# Patient Record
Sex: Female | Born: 1955 | Race: White | Hispanic: No | Marital: Single | Smoking: Current every day smoker
Health system: Southern US, Community
[De-identification: ages and names within clinical notes are randomized; demographics above are authoritative.]

## PROBLEM LIST (undated history)

## (undated) DIAGNOSIS — R569 Unspecified convulsions: Secondary | ICD-10-CM

## (undated) DIAGNOSIS — E039 Hypothyroidism, unspecified: Secondary | ICD-10-CM

## (undated) DIAGNOSIS — Z992 Dependence on renal dialysis: Secondary | ICD-10-CM

## (undated) DIAGNOSIS — I639 Cerebral infarction, unspecified: Secondary | ICD-10-CM

## (undated) DIAGNOSIS — C801 Malignant (primary) neoplasm, unspecified: Secondary | ICD-10-CM

## (undated) DIAGNOSIS — I69359 Hemiplegia and hemiparesis following cerebral infarction affecting unspecified side: Secondary | ICD-10-CM

## (undated) DIAGNOSIS — E119 Type 2 diabetes mellitus without complications: Secondary | ICD-10-CM

## (undated) DIAGNOSIS — N186 End stage renal disease: Secondary | ICD-10-CM

## (undated) DIAGNOSIS — I509 Heart failure, unspecified: Secondary | ICD-10-CM

## (undated) DIAGNOSIS — I8290 Acute embolism and thrombosis of unspecified vein: Secondary | ICD-10-CM

## (undated) DIAGNOSIS — I10 Essential (primary) hypertension: Secondary | ICD-10-CM

## (undated) HISTORY — PX: AMPUTATION TOE: SHX6595

## (undated) HISTORY — PX: INSERTION OF DIALYSIS CATHETER: SHX1324

## (undated) HISTORY — PX: CHOLECYSTECTOMY: SHX55

---

## 2006-07-05 ENCOUNTER — Emergency Department: Payer: Self-pay | Admitting: Emergency Medicine

## 2006-08-29 ENCOUNTER — Ambulatory Visit: Payer: Self-pay | Admitting: Family Medicine

## 2006-09-13 ENCOUNTER — Ambulatory Visit: Payer: Self-pay | Admitting: Family Medicine

## 2006-10-13 ENCOUNTER — Ambulatory Visit: Payer: Self-pay | Admitting: Family Medicine

## 2007-05-11 ENCOUNTER — Inpatient Hospital Stay: Payer: Self-pay | Admitting: Internal Medicine

## 2007-09-18 ENCOUNTER — Inpatient Hospital Stay: Payer: Self-pay | Admitting: Internal Medicine

## 2007-09-18 ENCOUNTER — Encounter: Payer: Self-pay | Admitting: Internal Medicine

## 2007-10-13 ENCOUNTER — Encounter: Payer: Self-pay | Admitting: Internal Medicine

## 2007-11-15 ENCOUNTER — Emergency Department: Payer: Self-pay | Admitting: Emergency Medicine

## 2008-11-02 ENCOUNTER — Inpatient Hospital Stay: Payer: Self-pay | Admitting: Internal Medicine

## 2009-02-13 ENCOUNTER — Observation Stay: Payer: Self-pay | Admitting: Student

## 2009-03-31 ENCOUNTER — Ambulatory Visit: Payer: Self-pay | Admitting: Cardiovascular Disease

## 2009-06-12 ENCOUNTER — Emergency Department: Payer: Self-pay | Admitting: Emergency Medicine

## 2009-06-20 ENCOUNTER — Encounter: Payer: Self-pay | Admitting: Rheumatology

## 2009-07-15 ENCOUNTER — Encounter: Payer: Self-pay | Admitting: Rheumatology

## 2009-07-19 ENCOUNTER — Observation Stay: Payer: Self-pay | Admitting: Internal Medicine

## 2009-08-12 ENCOUNTER — Encounter: Payer: Self-pay | Admitting: Rheumatology

## 2009-08-13 ENCOUNTER — Emergency Department: Payer: Self-pay

## 2009-10-06 ENCOUNTER — Ambulatory Visit: Payer: Self-pay | Admitting: Surgery

## 2009-10-13 ENCOUNTER — Inpatient Hospital Stay: Payer: Self-pay | Admitting: Surgery

## 2009-10-17 ENCOUNTER — Inpatient Hospital Stay: Payer: Self-pay | Admitting: Surgery

## 2010-12-21 ENCOUNTER — Inpatient Hospital Stay: Payer: Self-pay | Admitting: Internal Medicine

## 2011-01-06 ENCOUNTER — Other Ambulatory Visit: Payer: Self-pay | Admitting: Nephrology

## 2011-01-07 ENCOUNTER — Observation Stay: Payer: Self-pay | Admitting: Nephrology

## 2011-01-22 ENCOUNTER — Ambulatory Visit: Payer: Self-pay | Admitting: Vascular Surgery

## 2011-01-25 ENCOUNTER — Ambulatory Visit: Payer: Self-pay | Admitting: Vascular Surgery

## 2011-01-27 ENCOUNTER — Inpatient Hospital Stay: Payer: Self-pay | Admitting: Nephrology

## 2011-01-28 DIAGNOSIS — R7989 Other specified abnormal findings of blood chemistry: Secondary | ICD-10-CM

## 2011-02-25 ENCOUNTER — Emergency Department: Payer: Self-pay | Admitting: Emergency Medicine

## 2011-03-17 ENCOUNTER — Ambulatory Visit: Payer: Self-pay | Admitting: Gastroenterology

## 2011-03-24 ENCOUNTER — Ambulatory Visit: Payer: Self-pay | Admitting: Internal Medicine

## 2011-11-12 ENCOUNTER — Inpatient Hospital Stay: Payer: Self-pay | Admitting: Internal Medicine

## 2011-11-12 LAB — COMPREHENSIVE METABOLIC PANEL
Alkaline Phosphatase: 87 U/L (ref 50–136)
Anion Gap: 12 (ref 7–16)
BUN: 21 mg/dL — ABNORMAL HIGH (ref 7–18)
Calcium, Total: 8.9 mg/dL (ref 8.5–10.1)
Co2: 23 mmol/L (ref 21–32)
EGFR (African American): 11 — ABNORMAL LOW
EGFR (Non-African Amer.): 9 — ABNORMAL LOW
Osmolality: 282 (ref 275–301)
Potassium: 4.1 mmol/L (ref 3.5–5.1)
SGPT (ALT): 17 U/L
Sodium: 138 mmol/L (ref 136–145)
Total Protein: 7 g/dL (ref 6.4–8.2)

## 2011-11-12 LAB — URINALYSIS, COMPLETE
Bilirubin,UR: NEGATIVE
Ketone: NEGATIVE
Ph: 7 (ref 4.5–8.0)
Protein: 100
Specific Gravity: 1.014 (ref 1.003–1.030)
Squamous Epithelial: 3
WBC UR: 3653 /HPF (ref 0–5)

## 2011-11-12 LAB — CBC
HCT: 37.5 % (ref 35.0–47.0)
HGB: 12.7 g/dL (ref 12.0–16.0)
MCHC: 33.7 g/dL (ref 32.0–36.0)
MCV: 92 fL (ref 80–100)

## 2011-11-13 LAB — BASIC METABOLIC PANEL
Anion Gap: 13 (ref 7–16)
Calcium, Total: 7.8 mg/dL — ABNORMAL LOW (ref 8.5–10.1)
Co2: 21 mmol/L (ref 21–32)
Creatinine: 4.94 mg/dL — ABNORMAL HIGH (ref 0.60–1.30)
EGFR (Non-African Amer.): 9 — ABNORMAL LOW
Glucose: 108 mg/dL — ABNORMAL HIGH (ref 65–99)
Osmolality: 287 (ref 275–301)
Sodium: 142 mmol/L (ref 136–145)

## 2011-11-13 LAB — CBC WITH DIFFERENTIAL/PLATELET
Basophil %: 1 %
Eosinophil #: 0 10*3/uL (ref 0.0–0.7)
Eosinophil %: 0.2 %
HCT: 33.8 % — ABNORMAL LOW (ref 35.0–47.0)
HGB: 11.4 g/dL — ABNORMAL LOW (ref 12.0–16.0)
Lymphocyte #: 2.4 10*3/uL (ref 1.0–3.6)
Lymphocyte %: 24.9 %
MCH: 31.3 pg (ref 26.0–34.0)
MCHC: 33.9 g/dL (ref 32.0–36.0)
MCV: 92 fL (ref 80–100)
Monocyte #: 0.7 x10 3/mm (ref 0.2–0.9)
Monocyte %: 7 %
Neutrophil #: 6.4 10*3/uL (ref 1.4–6.5)
Neutrophil %: 66.9 %
RDW: 13.3 % (ref 11.5–14.5)
WBC: 9.5 10*3/uL (ref 3.6–11.0)

## 2011-11-13 LAB — BODY FLUID CELL COUNT WITH DIFFERENTIAL
Neutrophils: 3 %
Nucleated Cell Count: 185 /mm3
Other Cells BF: 0 %
Other Mononuclear Cells: 67 %

## 2011-11-14 LAB — URINE CULTURE

## 2011-11-17 LAB — CBC WITH DIFFERENTIAL/PLATELET
Basophil #: 0.1 10*3/uL (ref 0.0–0.1)
Basophil %: 0.5 %
Eosinophil #: 0.2 10*3/uL (ref 0.0–0.7)
HGB: 11.2 g/dL — ABNORMAL LOW (ref 12.0–16.0)
Lymphocyte #: 4 10*3/uL — ABNORMAL HIGH (ref 1.0–3.6)
Lymphocyte %: 38.4 %
MCH: 31.4 pg (ref 26.0–34.0)
Neutrophil %: 50.4 %
RDW: 13 % (ref 11.5–14.5)
WBC: 10.3 10*3/uL (ref 3.6–11.0)

## 2011-11-17 LAB — BASIC METABOLIC PANEL
Chloride: 100 mmol/L (ref 98–107)
Co2: 26 mmol/L (ref 21–32)
Creatinine: 3.53 mg/dL — ABNORMAL HIGH (ref 0.60–1.30)
EGFR (African American): 16 — ABNORMAL LOW
Glucose: 87 mg/dL (ref 65–99)
Osmolality: 273 (ref 275–301)
Potassium: 3.5 mmol/L (ref 3.5–5.1)

## 2011-11-17 LAB — BODY FLUID CULTURE

## 2011-11-18 LAB — BASIC METABOLIC PANEL
Anion Gap: 11 (ref 7–16)
BUN: 6 mg/dL — ABNORMAL LOW (ref 7–18)
Calcium, Total: 7.3 mg/dL — ABNORMAL LOW (ref 8.5–10.1)
Chloride: 106 mmol/L (ref 98–107)
Co2: 26 mmol/L (ref 21–32)
Creatinine: 2.69 mg/dL — ABNORMAL HIGH (ref 0.60–1.30)
Glucose: 91 mg/dL (ref 65–99)
Sodium: 143 mmol/L (ref 136–145)

## 2011-11-18 LAB — CBC WITH DIFFERENTIAL/PLATELET
Basophil #: 0 10*3/uL (ref 0.0–0.1)
Basophil %: 0.4 %
Eosinophil #: 0.2 10*3/uL (ref 0.0–0.7)
Eosinophil %: 3.1 %
HGB: 10.2 g/dL — ABNORMAL LOW (ref 12.0–16.0)
MCH: 31.3 pg (ref 26.0–34.0)
MCHC: 33.8 g/dL (ref 32.0–36.0)
MCV: 93 fL (ref 80–100)
Monocyte #: 0.8 x10 3/mm (ref 0.2–0.9)
Monocyte %: 12.1 %
Neutrophil #: 3 10*3/uL (ref 1.4–6.5)
Neutrophil %: 44.1 %
Platelet: 146 10*3/uL — ABNORMAL LOW (ref 150–440)
WBC: 6.8 10*3/uL (ref 3.6–11.0)

## 2011-11-22 LAB — PATHOLOGY REPORT

## 2011-12-14 ENCOUNTER — Ambulatory Visit: Payer: Self-pay | Admitting: Vascular Surgery

## 2011-12-28 ENCOUNTER — Ambulatory Visit: Payer: Self-pay | Admitting: Vascular Surgery

## 2011-12-28 LAB — CBC
HCT: 34.1 % — ABNORMAL LOW (ref 35.0–47.0)
HGB: 11 g/dL — ABNORMAL LOW (ref 12.0–16.0)
MCH: 30.4 pg (ref 26.0–34.0)
MCHC: 32.3 g/dL (ref 32.0–36.0)
MCV: 94 fL (ref 80–100)
Platelet: 200 10*3/uL (ref 150–440)
RDW: 14.3 % (ref 11.5–14.5)
WBC: 8.9 10*3/uL (ref 3.6–11.0)

## 2011-12-28 LAB — BASIC METABOLIC PANEL
Anion Gap: 12 (ref 7–16)
Calcium, Total: 7.9 mg/dL — ABNORMAL LOW (ref 8.5–10.1)
Chloride: 100 mmol/L (ref 98–107)
Co2: 21 mmol/L (ref 21–32)
Creatinine: 4.81 mg/dL — ABNORMAL HIGH (ref 0.60–1.30)
EGFR (African American): 11 — ABNORMAL LOW
Osmolality: 274 (ref 275–301)
Potassium: 3.5 mmol/L (ref 3.5–5.1)

## 2011-12-30 ENCOUNTER — Ambulatory Visit: Payer: Self-pay | Admitting: Vascular Surgery

## 2011-12-31 ENCOUNTER — Ambulatory Visit: Payer: Self-pay | Admitting: Vascular Surgery

## 2011-12-31 LAB — PROTIME-INR
INR: 0.9
Prothrombin Time: 12.5 secs (ref 11.5–14.7)

## 2012-02-24 ENCOUNTER — Ambulatory Visit: Payer: Self-pay | Admitting: Physician Assistant

## 2012-06-02 ENCOUNTER — Ambulatory Visit: Payer: Self-pay | Admitting: Vascular Surgery

## 2012-06-02 LAB — PROTIME-INR: INR: 0.9

## 2012-06-02 LAB — POTASSIUM: Potassium: 3.6 mmol/L (ref 3.5–5.1)

## 2012-06-03 ENCOUNTER — Emergency Department: Payer: Self-pay | Admitting: Emergency Medicine

## 2012-06-05 ENCOUNTER — Emergency Department: Payer: Self-pay | Admitting: Emergency Medicine

## 2012-06-30 ENCOUNTER — Ambulatory Visit: Payer: Self-pay | Admitting: Vascular Surgery

## 2012-06-30 LAB — BASIC METABOLIC PANEL
Anion Gap: 10 (ref 7–16)
Creatinine: 5.18 mg/dL — ABNORMAL HIGH (ref 0.60–1.30)
EGFR (African American): 10 — ABNORMAL LOW
Glucose: 106 mg/dL — ABNORMAL HIGH (ref 65–99)
Sodium: 137 mmol/L (ref 136–145)

## 2012-06-30 LAB — CBC
HCT: 32.4 % — ABNORMAL LOW (ref 35.0–47.0)
MCH: 32.3 pg (ref 26.0–34.0)
MCV: 96 fL (ref 80–100)
Platelet: 237 10*3/uL (ref 150–440)
RBC: 3.38 10*6/uL — ABNORMAL LOW (ref 3.80–5.20)
RDW: 14.2 % (ref 11.5–14.5)
WBC: 12.1 10*3/uL — ABNORMAL HIGH (ref 3.6–11.0)

## 2012-08-09 ENCOUNTER — Ambulatory Visit: Payer: Self-pay | Admitting: Physician Assistant

## 2013-07-14 LAB — CBC
HCT: 35.3 % (ref 35.0–47.0)
HGB: 12 g/dL (ref 12.0–16.0)
MCH: 31.6 pg (ref 26.0–34.0)
MCHC: 33.9 g/dL (ref 32.0–36.0)
MCV: 93 fL (ref 80–100)
Platelet: 365 10*3/uL (ref 150–440)
RBC: 3.78 10*6/uL — ABNORMAL LOW (ref 3.80–5.20)
RDW: 12.9 % (ref 11.5–14.5)
WBC: 11.7 10*3/uL — ABNORMAL HIGH (ref 3.6–11.0)

## 2013-07-14 LAB — BASIC METABOLIC PANEL
Anion Gap: 8 (ref 7–16)
BUN: 26 mg/dL — ABNORMAL HIGH (ref 7–18)
Calcium, Total: 7.8 mg/dL — ABNORMAL LOW (ref 8.5–10.1)
Chloride: 92 mmol/L — ABNORMAL LOW (ref 98–107)
Co2: 29 mmol/L (ref 21–32)
Creatinine: 4.77 mg/dL — ABNORMAL HIGH (ref 0.60–1.30)
EGFR (African American): 11 — ABNORMAL LOW
EGFR (Non-African Amer.): 9 — ABNORMAL LOW
GLUCOSE: 185 mg/dL — AB (ref 65–99)
Osmolality: 269 (ref 275–301)
Potassium: 2.5 mmol/L — CL (ref 3.5–5.1)
SODIUM: 129 mmol/L — AB (ref 136–145)

## 2013-07-15 LAB — CBC WITH DIFFERENTIAL/PLATELET
BASOS ABS: 0 10*3/uL (ref 0.0–0.1)
Basophil %: 0.5 %
EOS ABS: 0.1 10*3/uL (ref 0.0–0.7)
EOS PCT: 1.1 %
HCT: 30.8 % — ABNORMAL LOW (ref 35.0–47.0)
HGB: 10.7 g/dL — AB (ref 12.0–16.0)
LYMPHS PCT: 19 %
Lymphocyte #: 1.9 10*3/uL (ref 1.0–3.6)
MCH: 32 pg (ref 26.0–34.0)
MCHC: 34.7 g/dL (ref 32.0–36.0)
MCV: 92 fL (ref 80–100)
MONOS PCT: 7.2 %
Monocyte #: 0.7 x10 3/mm (ref 0.2–0.9)
Neutrophil #: 7.2 10*3/uL — ABNORMAL HIGH (ref 1.4–6.5)
Neutrophil %: 72.2 %
PLATELETS: 330 10*3/uL (ref 150–440)
RBC: 3.34 10*6/uL — AB (ref 3.80–5.20)
RDW: 12.9 % (ref 11.5–14.5)
WBC: 10 10*3/uL (ref 3.6–11.0)

## 2013-07-15 LAB — BASIC METABOLIC PANEL
Anion Gap: 7 (ref 7–16)
BUN: 28 mg/dL — ABNORMAL HIGH (ref 7–18)
CHLORIDE: 95 mmol/L — AB (ref 98–107)
CO2: 29 mmol/L (ref 21–32)
Calcium, Total: 7.8 mg/dL — ABNORMAL LOW (ref 8.5–10.1)
Creatinine: 4.99 mg/dL — ABNORMAL HIGH (ref 0.60–1.30)
EGFR (African American): 10 — ABNORMAL LOW
EGFR (Non-African Amer.): 9 — ABNORMAL LOW
Glucose: 109 mg/dL — ABNORMAL HIGH (ref 65–99)
Osmolality: 269 (ref 275–301)
Potassium: 3.1 mmol/L — ABNORMAL LOW (ref 3.5–5.1)
SODIUM: 131 mmol/L — AB (ref 136–145)

## 2013-07-15 LAB — URINALYSIS, COMPLETE
BACTERIA: NONE SEEN
BILIRUBIN, UR: NEGATIVE
BLOOD: NEGATIVE
Glucose,UR: 50 mg/dL (ref 0–75)
KETONE: NEGATIVE
Leukocyte Esterase: NEGATIVE
Nitrite: NEGATIVE
Ph: 7 (ref 4.5–8.0)
Protein: 100
RBC,UR: 1 /HPF (ref 0–5)
SPECIFIC GRAVITY: 1.01 (ref 1.003–1.030)
Squamous Epithelial: 1

## 2013-07-15 LAB — BODY FLUID CELL COUNT WITH DIFFERENTIAL
Basophil: 0 %
Eosinophil: 0 %
Lymphocytes: 11 %
Neutrophils: 83 %
Nucleated Cell Count: 2078 /mm 3
Other Cells BF: 0 %
Other Mononuclear Cells: 6 %

## 2013-07-16 ENCOUNTER — Inpatient Hospital Stay: Payer: Self-pay | Admitting: Specialist

## 2013-07-16 LAB — BASIC METABOLIC PANEL
Anion Gap: 6 — ABNORMAL LOW (ref 7–16)
BUN: 25 mg/dL — ABNORMAL HIGH (ref 7–18)
CALCIUM: 8.2 mg/dL — AB (ref 8.5–10.1)
CHLORIDE: 95 mmol/L — AB (ref 98–107)
CREATININE: 4.11 mg/dL — AB (ref 0.60–1.30)
Co2: 30 mmol/L (ref 21–32)
EGFR (African American): 13 — ABNORMAL LOW
EGFR (Non-African Amer.): 11 — ABNORMAL LOW
Glucose: 183 mg/dL — ABNORMAL HIGH (ref 65–99)
OSMOLALITY: 272 (ref 275–301)
Potassium: 3 mmol/L — ABNORMAL LOW (ref 3.5–5.1)
Sodium: 131 mmol/L — ABNORMAL LOW (ref 136–145)

## 2013-07-16 LAB — BODY FLUID CELL COUNT WITH DIFFERENTIAL
BASOS ABS: 0 %
Eosinophil: 0 %
Lymphocytes: 15 %
Neutrophils: 62 %
Nucleated Cell Count: 165 /mm3
Other Cells BF: 0 %
Other Mononuclear Cells: 23 %

## 2013-07-16 LAB — URINE CULTURE

## 2013-07-16 LAB — PHOSPHORUS: PHOSPHORUS: 2.5 mg/dL (ref 2.5–4.9)

## 2013-07-17 LAB — VANCOMYCIN, TROUGH: VANCOMYCIN, TROUGH: 14 ug/mL (ref 10–20)

## 2013-07-19 LAB — CULTURE, BLOOD (SINGLE)

## 2013-07-24 LAB — MISC AER/ANAEROBIC CULT.

## 2013-08-25 ENCOUNTER — Emergency Department: Payer: Self-pay | Admitting: Emergency Medicine

## 2013-09-11 ENCOUNTER — Ambulatory Visit: Payer: Self-pay | Admitting: Family

## 2014-05-30 ENCOUNTER — Inpatient Hospital Stay: Payer: Self-pay | Admitting: Internal Medicine

## 2014-05-30 LAB — URINALYSIS, COMPLETE
BACTERIA: NONE SEEN
BILIRUBIN, UR: NEGATIVE
Blood: NEGATIVE
Glucose,UR: >=500 mg/dL (ref 0–75)
KETONE: NEGATIVE
LEUKOCYTE ESTERASE: NEGATIVE
Nitrite: NEGATIVE
Ph: 7 (ref 4.5–8.0)
Protein: 100
RBC,UR: 3 /HPF (ref 0–5)
Specific Gravity: 1.009 (ref 1.003–1.030)
WBC UR: 12 /HPF (ref 0–5)

## 2014-05-30 LAB — COMPREHENSIVE METABOLIC PANEL
Albumin: 2.6 g/dL — ABNORMAL LOW (ref 3.4–5.0)
Alkaline Phosphatase: 233 U/L — ABNORMAL HIGH
Anion Gap: 17 — ABNORMAL HIGH (ref 7–16)
BILIRUBIN TOTAL: 0.5 mg/dL (ref 0.2–1.0)
BUN: 40 mg/dL — ABNORMAL HIGH (ref 7–18)
CO2: 24 mmol/L (ref 21–32)
CREATININE: 6.25 mg/dL — AB (ref 0.60–1.30)
Calcium, Total: 9.3 mg/dL (ref 8.5–10.1)
Chloride: 90 mmol/L — ABNORMAL LOW (ref 98–107)
EGFR (African American): 9 — ABNORMAL LOW
GFR CALC NON AF AMER: 7 — AB
GLUCOSE: 295 mg/dL — AB (ref 65–99)
OSMOLALITY: 283 (ref 275–301)
Potassium: 2.9 mmol/L — ABNORMAL LOW (ref 3.5–5.1)
SGOT(AST): 45 U/L — ABNORMAL HIGH (ref 15–37)
SGPT (ALT): 37 U/L
SODIUM: 131 mmol/L — AB (ref 136–145)
Total Protein: 8 g/dL (ref 6.4–8.2)

## 2014-05-30 LAB — DIFFERENTIAL
BASOS PCT: 1 %
Basophil #: 0.2 10*3/uL — ABNORMAL HIGH (ref 0.0–0.1)
EOS PCT: 0 %
Eosinophil #: 0 10*3/uL (ref 0.0–0.7)
Lymphocyte #: 1.9 10*3/uL (ref 1.0–3.6)
Lymphocyte %: 9.5 %
MONOS PCT: 3.4 %
Monocyte #: 0.7 x10 3/mm (ref 0.2–0.9)
NEUTROS ABS: 17.3 10*3/uL — AB (ref 1.4–6.5)
Neutrophil %: 86.1 %

## 2014-05-30 LAB — CBC
HCT: 41.2 % (ref 35.0–47.0)
HGB: 14 g/dL (ref 12.0–16.0)
MCH: 31.7 pg (ref 26.0–34.0)
MCHC: 33.9 g/dL (ref 32.0–36.0)
MCV: 94 fL (ref 80–100)
PLATELETS: 350 10*3/uL (ref 150–440)
RBC: 4.4 10*6/uL (ref 3.80–5.20)
RDW: 12.2 % (ref 11.5–14.5)
WBC: 20.1 10*3/uL — AB (ref 3.6–11.0)

## 2014-05-30 LAB — PROTIME-INR
INR: 1
PROTHROMBIN TIME: 13.3 s (ref 11.5–14.7)

## 2014-05-30 LAB — LIPASE, BLOOD: LIPASE: 246 U/L (ref 73–393)

## 2014-05-31 LAB — BODY FLUID CELL COUNT WITH DIFFERENTIAL
Basophil: 0 %
EOS PCT: 0 %
LYMPHS PCT: 21 %
NEUTROS PCT: 0 %
NUCLEATED CELL COUNT: 22 /mm3
Other Cells BF: 0 %
Other Mononuclear Cells: 79 %

## 2014-05-31 LAB — BASIC METABOLIC PANEL
Anion Gap: 16 (ref 7–16)
BUN: 35 mg/dL — ABNORMAL HIGH (ref 7–18)
CO2: 21 mmol/L (ref 21–32)
Calcium, Total: 9.2 mg/dL (ref 8.5–10.1)
Chloride: 98 mmol/L (ref 98–107)
Creatinine: 5.19 mg/dL — ABNORMAL HIGH (ref 0.60–1.30)
EGFR (African American): 11 — ABNORMAL LOW
GFR CALC NON AF AMER: 9 — AB
GLUCOSE: 223 mg/dL — AB (ref 65–99)
Osmolality: 285 (ref 275–301)
POTASSIUM: 3 mmol/L — AB (ref 3.5–5.1)
SODIUM: 135 mmol/L — AB (ref 136–145)

## 2014-05-31 LAB — HEMOGLOBIN: HGB: 11.3 g/dL — ABNORMAL LOW (ref 12.0–16.0)

## 2014-05-31 LAB — CBC WITH DIFFERENTIAL/PLATELET
BASOS ABS: 0 10*3/uL (ref 0.0–0.1)
Basophil %: 0.2 %
EOS ABS: 0 10*3/uL (ref 0.0–0.7)
Eosinophil %: 0 %
HCT: 35 % (ref 35.0–47.0)
HGB: 11.7 g/dL — ABNORMAL LOW (ref 12.0–16.0)
Lymphocyte #: 2.1 10*3/uL (ref 1.0–3.6)
Lymphocyte %: 11.9 %
MCH: 31.7 pg (ref 26.0–34.0)
MCHC: 33.4 g/dL (ref 32.0–36.0)
MCV: 95 fL (ref 80–100)
MONO ABS: 0.9 x10 3/mm (ref 0.2–0.9)
Monocyte %: 5.1 %
NEUTROS ABS: 14.4 10*3/uL — AB (ref 1.4–6.5)
NEUTROS PCT: 82.8 %
PLATELETS: 282 10*3/uL (ref 150–440)
RBC: 3.69 10*6/uL — AB (ref 3.80–5.20)
RDW: 12 % (ref 11.5–14.5)
WBC: 17.4 10*3/uL — AB (ref 3.6–11.0)

## 2014-05-31 LAB — MAGNESIUM: Magnesium: 2.2 mg/dL

## 2014-05-31 LAB — HEMOGLOBIN A1C: Hemoglobin A1C: 8 % — ABNORMAL HIGH (ref 4.2–6.3)

## 2014-06-04 LAB — CULTURE, BLOOD (SINGLE)

## 2014-06-04 LAB — BODY FLUID CULTURE

## 2014-10-01 NOTE — Op Note (Signed)
PATIENT NAME:  Sherri, Davidson MR#:  [id number] DATE OF BIRTH:  1955/06/22  DATE OF PROCEDURE:  12/31/2011  PREOPERATIVE DIAGNOSES:  1. Complication of dialysis device with poor flow, peritoneal dialysis catheter.  2. Endstage renal disease requiring hemodialysis.   POSTOPERATIVE DIAGNOSES:  1. Complication of dialysis device with poor flow, peritoneal dialysis catheter.  2. Endstage renal disease requiring hemodialysis.   PROCEDURE PERFORMED:  Laparoscopic revision of peritoneal dialysis catheter.   SURGEON: Katha Cabal, M.D.   ANESTHESIA: General by endotracheal intubation.   FLUIDS: Per anesthesia record.   ESTIMATED BLOOD LOSS: Minimal.   SPECIMEN: None.   INDICATIONS: [name] is a 59 year old woman who has been undergoing peritoneal dialysis and over the last several weeks has developed worsening returns to the point where she is no longer able to dialyze. She is therefore undergoing laparoscopic revision. The risks and benefits are reviewed, all questions answered. The patient agrees to proceed.   DESCRIPTION OF PROCEDURE: The patient is taken to the operating room and placed in the supine position. After adequate general anesthesia is induced and appropriate time-out has been called, her abdomen is prepped and draped in sterile fashion. A curvilinear incision is created in the infraumbilical margin and carried down to expose the fascia, which is hooked with a bone hook. The Veress needle is introduced without difficulty, and a saline test is used to verify. Low-flow insufflation with CO2 is then performed, then high flow to pressurize the peritoneal cavity to 15 mmHg. A 10-mm trocar is introduced, and subsequently the camera is introduced. Initial inspection demonstrates that the bowels appear quite inflamed. There is significant inflammation and inflammatory changes of the peritoneal wall as well. However, there are no significant adhesions and the catheter does appear to be  positioned in the pelvis.   5-mm trocars are then placed under direct visualization. Grasper is used to pull the catheter out of the pelvis and the fibrinous exudate is noted throughout the entire length of the catheter. Using a forceful injection of saline, this exudate is removed and the catheter upon reinspection is now completely free of any material. PEER retractor is then passed into the pelvis and the bowels reflected back. The catheter is grabbed in its pigtail portion by a grasper and then passed as far down into the pelvis as the length of the catheter would allow.  Retractors are removed. Saline test to 300 mL is performed with the return of 500 mL in rapid fashion.   Trocars are removed. A pursestring suture of 0 Monocryl is used to close the fascia at the 10-mm hole and 4-0 Monocryl is used to close the skin. 3-0 Vicryl was used to reapproximate the subcutaneous layers in the infraumbilical incision. Dermabond is applied. Sterile dressing is placed around the peritoneal catheter exit site. The patient tolerated the procedure well and there were no immediate complications.    ____________________________ Katha Cabal, MD ggs:bjt D: 12/31/2011 13:42:42 ET T: 12/31/2011 14:56:26 ET JOB#: [id number]  cc: Katha Cabal, MD, <Dictator> Munsoor Lilian Kapur, MD Katha Cabal MD ELECTRONICALLY SIGNED 01/06/2012 12:25

## 2014-10-01 NOTE — Op Note (Signed)
PATIENT NAME:  Sherri Davidson, Sherri Davidson MR#:  [id number] DATE OF BIRTH:  1956-04-04  DATE OF PROCEDURE:  02/24/2012  PREOPERATIVE DIAGNOSES:  1. End-stage renal disease.  2. Complication to dialysis device.  POSTOPERATIVE DIAGNOSES:  1. End-stage renal disease.  2. Complication to dialysis device.  PROCEDURE PERFORMED: Removal of right IJ cuffed tunneled dialysis catheter.   SURGEON: Lane Hacker, PA-C  ANESTHESIA: 1% local lidocaine.   DESCRIPTION OF PROCEDURE: Patient is positioned supine. Cuff is palpated. 1% lidocaine with epinephrine is infiltrated in the surrounding soft tissues. Cuff is then dissected free from surrounding tissues. Catheter is transected. Hub assembly is removed without difficulty and the intravascular portion is then removed without difficulty. Pressure is held at the base of the neck. Sterile dressing is applied to the exit site. Patient tolerated procedure well and there were no immediate complications.    ____________________________ Lane Hacker, PA-C jmk:cms D: 02/24/2012 12:47:11 ET T: 02/24/2012 13:48:56 ET JOB#: [id number]  cc: Lane Hacker, PA-C, <Dictator> Lane Hacker PA ELECTRONICALLY SIGNED 03/07/2012 7:29

## 2014-10-01 NOTE — Op Note (Signed)
PATIENT NAME:  Sherri Davidson, Sherri Davidson MR#:  [id number] DATE OF BIRTH:  02-14-1956  DATE OF PROCEDURE:  12/30/2011  PREOPERATIVE DIAGNOSES:  1. End-stage renal disease. 2. Nonfunctional tunnel hemodialysis catheter.   POSTOPERATIVE DIAGNOSES:  1. End-stage renal disease. 2. Nonfunctional tunneled hemodialysis catheter.   PROCEDURE: Continuous infusion of TPA for nonfunctional tunneled hemodialysis catheter.  SURGEON:  Algernon Huxley, MD  ANESTHESIA: None.  BLOOD LOSS: None.   INDICATION FOR PROCEDURE: This is a 59 year old white female with a nonfunctional right jugular PermCath. She is here for TPA infusion to restore patency.   DESCRIPTION OF PROCEDURE: Patient is brought to the vascular interventional radiology area. 2 mg of alteplase is infused over a two hour period through both lumens of the tunneled hemodialysis catheter. At the conclusion of the infusion we sterilely withdrew blood and flushed easily with sterile saline in both lumens. A concentrated heparin solution was then placed in both lumens and sterile caps were placed. The patient tolerated the procedure well.   ____________________________ Algernon Huxley, MD jsd:cms D: 12/30/2011 15:21:26 ET T: 12/30/2011 15:59:53 ET JOB#: [id number]  cc: Algernon Huxley, MD, <Dictator>  Algernon Huxley MD ELECTRONICALLY SIGNED 01/03/2012 16:19

## 2014-10-04 NOTE — Op Note (Signed)
PATIENT NAME:  Sherri Davidson, WAHLQUIST MR#:  [id number] DATE OF BIRTH:  September 03, 1955  DATE OF PROCEDURE:  08/09/2012  PREOPERATIVE DIAGNOSIS:  End-stage renal disease with functional peritoneal dialysis catheter and no longer needing Perm Cath.   POSTOPERATIVE DIAGNOSIS: End-stage renal disease with functional peritoneal dialysis catheter and no longer needing Perm catheter.  PROCEDURE PERFORMED:  Removal of right jugular Perm Cath.   SURGEON:  Hortencia Pilar, MD/Roniel Halloran Chyrl Civatte, PA-C.    ANESTHESIA:  Local.   ESTIMATED BLOOD LOSS:  Minimal.    INDICATION FOR THE PROCEDURE:  Patient with end-stage renal disease, currently using peritoneal dialysis catheter without issues.  She is no longer in need of Perm Cath; therefore, this will be removed.    DESCRIPTION OF THE PROCEDURE:  The patient is brought to vascular interventional radiology area.  Right neck and chest and existing catheter were sterilely prepped and draped and a sterile surgical field was created.  The  area was locally anesthetized copiously with 1% lidocaine.  Hemostats were used to help dissect out the cuff. An 11 blade was used to transect the fibrous sheath connected to the cuff.  The catheter was then removed in its entirety without difficulty with gentle traction.  Pressure was held.  Sterile dressing was placed.  The patient tolerated the procedure well.     ____________________________ Marin Shutter. Maelynn Moroney, PA-C cnh:cc D: 08/09/2012 18:43:19 ET T: 08/09/2012 21:22:49 ET JOB#: [id number]  cc: Marin Shutter. Malcolm Quast, PA-C, <Dictator> Carpenter PA ELECTRONICALLY SIGNED 10/05/2012 8:54

## 2014-10-04 NOTE — Op Note (Signed)
PATIENT NAME:  Sherri Davidson, Sherri Davidson MR#:  [id number] DATE OF BIRTH:  13-Oct-1955  DATE OF PROCEDURE:  06/30/2012  PREOPERATIVE DIAGNOSES: 1. End-stage renal disease requiring hemodialysis.  2. Complication dialysis device with infection of peritoneal dialysis catheter status post removal.   POSTOPERATIVE DIAGNOSES:  1. End-stage renal disease requiring hemodialysis.  2. Complication dialysis device with infection of peritoneal dialysis catheter status post removal.  PROCEDURE PERFORMED: Laparoscopic-assisted insertion of peritoneal dialysis catheter.   PROCEDURE PERFORMED BY: Servando Salina, M.D.   ANESTHESIA: General by endotracheal intubation.   FLUIDS: Per anesthesia record.   ESTIMATED BLOOD LOSS: Minimal.   SPECIMEN: None.   INDICATIONS: The patient is a 59 year old woman maintained on hemodialysis as an interim therapy. Her peritoneal dialysis catheter became infected and was removed approximately one month ago. She is now cleared of her infection and undergoing reinsertion of a peritoneal catheter. The risks and benefits were reviewed. The patient has agreed to proceed.   PROCEDURE: The patient is taken to the Operating Room and placed in supine position. After adequate general anesthesia is induced and appropriate invasive monitors are placed, she is positioned supine and her abdomen is prepped and draped in a sterile fashion.   A curvilinear incision is made in the infraumbilical margin and the dissection is carried down to expose the fascia. A pursestring suture of 0 Vicryl is placed into the abdominal wall fascia, a bone hook is used to elevate the abdominal wall and a Veress needle is introduced without difficulty. Low-flow insufflation is performed and then subsequently high flow insufflation with CO2 to an intra-abdominal pressure of 15. A 10 mm trocar is then introduced without difficulty and the camera is advanced into the peritoneal cavity. No significant adhesions are noted.  No abnormalities are noted in the visualized portions.   A curvilinear incision is then created in left lower quadrant. Dissection is carried down to expose the fascia. The location is observed under laparoscopic guidance and a Seldinger needle is inserted through the abdominal wall under direct visualization. A J-wire is advanced followed by the peel-away sheath and dilator. The dilator and wire were removed and the  peritoneal dialysis catheter is advanced through the peel-away sheath. It is positioned down deep into the pelvis with the assistance of the trocar. The catheter is then secured into the rectus with 3 interrupted 2-0 Ethibond sutures. The catheter is then tunneled in the subcutaneous tissues and exits the abdominal wall superiorly approximately 3 fingerbreadths below the costal margin just to the left of midline. Then 300 mL of saline is infused, and greater than 200 is retrieved.   The gas is then expelled from the peritoneal cavity, trocars were removed, the pursestring suture is used to secure the fascia and both incisions then closed in layers using 2-0 followed by 3-0 followed by 4-0 Monocryl subcuticular.   The patient tolerated the procedure well and there were no immediate complications. Sponge and needle counts were correct and she is taken to the recovery area in excellent condition.   ____________________________ Katha Cabal, MD ggs:jm D: 06/30/2012 17:38:02 ET T: 07/01/2012 12:30:06 ET JOB#: [id number]  cc: Katha Cabal, MD, <Dictator> Munsoor Lilian Kapur, MD Katha Cabal MD ELECTRONICALLY SIGNED 07/07/2012 10:04

## 2014-10-05 NOTE — Discharge Summary (Signed)
PATIENT NAME:  Sherri Davidson, Sherri Davidson MR#:  [id number] DATE OF BIRTH:  11/13/1955  DATE OF ADMISSION: 07/14/2013 DATE OF DISCHARGE:  07/17/2013  For a detailed note, please take a look at the history and physical done on admission by Dr. Waldron Labs.    DIAGNOSES AT DISCHARGE:  1.  Peritonitis related to peritoneal dialysis.  2.  End-stage renal disease on peritoneal dialysis.  3.  Fever, likely secondary to peritonitis.  4.  Hypothyroidism.  5.  Hypokalemia.   DIET: Discharged on a renal diet.   ACTIVITY: As tolerated.   FOLLOWUP: Dr. Glean Salen and Dr. Dorthea Cove in the next 1 to 2 weeks.   DISCHARGE MEDICATIONS: Plavix 75 mg daily, Synthroid 25 mcg daily, hydroxyzine hydrochloride 25 mg one tab daily, loperamide 2 mg q.4 hours as needed, aspirin 81 mg daily, Protonix 40 mg daily, promethazine 25 mg t.i.d. as needed, Percocet 5/325 one tab q.6 hours as needed, vancomycin 1000 mg q.48 hours times 2 weeks to be dosed by nephrology.   CONSULTANTS DURING THE HOSPITAL COURSE:  Dr. Glean Salen, nephrology.   PERTINENT STUDIES DONE DURING THE HOSPITAL COURSE: Chest x-ray done on admission showing no acute cardiopulmonary disease.   An abdominal fluid culture growing coagulase-negative staph and blood cultures also growing coagulase-negative staph.   HOSPITAL COURSE: This is a 59 year old female with medical problems as mentioned above, who presented to the hospital on 07/14/2013 due to fever.  1.  Fever of unknown origin. Initially when the patient presented with a fever, the source was  unclear. The patient underwent extensive work-up for the fever with a chest x-ray, blood cultures, and also underwent a peritoneal fluid analysis. Her peritoneal fluid showed more than 1000 cells which was consistent with peritonitis, likely the cause of her fever. She was empirically given cefepime and vancomycin although the cefepime was discontinued as her cultures from the fluid grew out  coagulase-negative staph. The patient has had no further fevers in the past 48 hours, is hemodynamically stable. Has no belly pain. No evidence of any severe peritonitis. She is being discharged home and IV vancomycin is going to be dosed at the nephrologist's office at the training center over the next 2 weeks.  2.  Peritonitis. This was likely the cause of the patient's fever. The patient is on vancomycin and is going to need at least a total of 2 weeks more therapy which is going to be arranged at Dr. Elwyn Lade office. The patient is currently abdominal- pain-free and hemodynamically stable.  3.  End-stage renal disease on peritoneal dialysis. The patient was maintained on peritoneal dialysis while in the hospital. She will resume her schedule as an outpatient.  4.  Hypokalemia. This was supplemented and corrected with peritoneal dialysis.  5.  Hypothyroidism. The patient was maintained on her Synthroid. She will resume that.  6.  History of peripheral vascular disease. The patient was maintained on her Plavix and she will resume that upon discharge too.  7.  GERD. The patient was maintained on her Protonix and she will resume that upon discharge.   CODE STATUS: FULL CODE.   TIME SPENT: 45 minutes.   ____________________________ Belia Heman. Verdell Carmine, MD vjs:np D: 07/17/2013 17:48:01 ET T: 07/17/2013 21:15:59 ET JOB#: [id number]  cc: Belia Heman. Verdell Carmine, MD, <Dictator> Munsoor Lilian Kapur, MD Danelle Berry. Derrek Monaco, MD Henreitta Leber MD ELECTRONICALLY SIGNED 07/28/2013 18:05

## 2014-10-05 NOTE — H&P (Signed)
PATIENT NAME:  Sherri Davidson, Sherri Davidson MR#:  [id number] DATE OF BIRTH:  1956-02-13  DATE OF ADMISSION:  05/30/2014   PRIMARY CARE PHYSICIAN:  Derrek Monaco, MD   REFERRING PHYSICIAN:  Dr.  Jimmye Norman    CHIEF COMPLAINT: Nausea, vomiting, diarrhea for 3 days; coffee-ground vomiting today.   HISTORY OF PRESENT ILLNESS: A 59 year old Caucasian female with a history of ESRD on PD, coronary artery disease and CVA was sent from home to ED due to the above chief complaint.  The patient is weak, in no acute distress. The patient looks lethargic, unable to provide detailed information.  Upon asking, the patient complains of nausea, vomiting and diarrhea for 3 days, but she denies any fever or chills. No headache or dizziness. Denies any melena or bloody stool.  According to the patient's sister, her POA, the patient also started to have coffee-ground vomiting today multiple times today.  The patient was noted to have a leukocytosis with WBC at 20,000 and a blood culture was sent.   PAST MEDICAL HISTORY:  1.  ESRD on peritoneal dialysis, peritonitis several months ago.  2.  History of diabetes, but not taking any medication.  3.  Hypertension.  4.  CVA with left-sided weakness.  5.  Coronary artery disease with stent placement in 2010  6.  History of congestive heart failure.  7.  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  8.  Chronic obstructive pulmonary disease.  9.  Anemia of chronic disease.  10. GERD.  11.  According to POA, the patient also had a stomach ulcer before.  PAST SURGICAL HISTORY: PD catheter, cholecystectomy, kidney biopsy, left foot fourth and fifth toe amputation.   SOCIAL HISTORY: Smokes 2 to 3 cigarettes a day; before it was like 3 packs a day, no alcohol drinking or illicit drugs.   FAMILY HISTORY: CVA, congestive heart failure.  REVIEW OF SYSTEMS:  CONSTITUTIONAL: The patient denies any fever or chills. No headache or dizziness.  EYES: No double vision,  blurred vision.  ENT: No postnasal drip, slurred speech or dysphagia.  CARDIOVASCULAR: No chest pain, palpitation, orthopnea, or nocturnal dyspnea. No leg edema.  PULMONARY: No cough, sputum, shortness of breath, or hematemesis.  GASTROINTESTINAL: No abdominal pain, but has nausea, vomiting and diarrhea, and coffee-ground vomiting, no melena or bloody stool.  GENITOURINARY: No dysuria, hematuria, or incontinence.  SKIN: No rash or jaundice.  NEUROLOGIC: No syncope, loss of consciousness, or seizure.  ENDOCRINOLOGY: No polyuria, polydipsia, heat or cold intolerance.  HEMATOLOGIC: No easy bruising or bleeding.      PHYSICAL EXAMINATION:  VITAL SIGNS: Temperature 97.7, blood pressure 173/112, pulse 100, oxygen saturation 100% on room air.  GENERAL: The patient is awake, alert, has mild confusion, in no acute distress.  HEENT: Pupils round and equal, and reactive to light and accommodation. Moist oral mucosa. Clear oropharynx.  NECK: Supple. No JVD or carotid bruits are noted. No lymphadenopathy. No thyromegaly,  CARDIOVASCULAR: S1, S2 regular rate and rhythm. No murmurs or gallops.  PULMONARY: Bilateral air entry. No wheezing or rales. No use of accessory muscle to breathe.  ABDOMEN: Soft, obese. Bowel sounds present. No distention. No tenderness. No organomegaly. Bowel sounds present.   EXTREMITIES: No edema, clubbing or cyanosis. No calf tenderness. Bilateral pedal pulses present.  SKIN: No rash or jaundice.  NEUROLOGIC: The patient is alert, awake, follows commands. No obvious focal deficits. Power 4/4. Sensation intact.   LABORATORY DATA:  WBC 20.1, hemoglobin 14, platelets 350,000, lipase 246, glucose  295, BUN 40, creatinine 6.25, sodium 131, potassium 2.9, chloride 90, bicarbonate 24, SGPT 37, SGOT 45, alkaline phosphatase 233 and INR 1.1.   IMPRESSIONS:  1.  Nausea, vomiting and diarrhea with leukocytosis. Possible peritonitis or acute gastroenteritis.  2.  Leukocytosis.  3.   Gastrointestinal bleeding with hematemesis, which is possibly due to gastritis or peptic ulcer disease.   4.  Hyponatremia.  5.  Hypokalemia.  6.  End-stage renal disease on peritoneal dialysis,.  7.  History of cerebrovascular accident, coronary artery disease, peptic ulcer disease and chronic obstructive pulmonary disease.   PLAN OF TREATMENT:  1.  The patient will be admitted to medical floor. I will give vancomycin and follow up blood culture.  We will get a nephrology consult and also follow up CBC.  2.  For hematemesis with gastrointestinal bleeding, we will keep the patient n.p.o. except medication and start Protonix 40 mg IV b.i.d. and get a GI consult. Follow up with hemoglobin.  3.  Accelerated hypertension I will start hydralazine IV p.r.n. and start Norvasc.  4.  For diabetes, we will start a sliding scale.  5.  For coronary artery disease and the history of cerebrovascular accident I will hold aspirin and Plavix due to gastrointestinal bleeding. 6.   For hyponatremia and hypokalemia, follow up nephrologist to get a supplement during dialysis.  7.  I discussed the patient's condition and plan of treatment with the patient's sister, her power of attorney and she said the patient is a FULL CODE.   TIME SPENT: About 63 minutes.     ____________________________ Demetrios Loll, MD qc:DT D: 05/30/2014 14:08:36 ET T: 05/30/2014 15:48:57 ET JOB#: [id number]  cc: Demetrios Loll, MD, <Dictator> Demetrios Loll MD ELECTRONICALLY SIGNED 05/31/2014 10:58

## 2014-10-05 NOTE — H&P (Signed)
PATIENT NAME:  Sherri Davidson, Sherri Davidson MR#:  [id number] DATE OF BIRTH:  October 10, 1955  DATE OF ADMISSION:  07/14/2013  REFERRING PHYSICIAN:  Dr. Lenise Arena.  PRIMARY CARE PHYSICIAN:  The patient following at Dr. Derrek Monaco at [facility].   CHIEF COMPLAINT:  Fever.    HISTORY OF PRESENT ILLNESS:  This is a 59 year old female with end-stage renal disease on peritoneal dialysis, hypertension, history of CVA with residual left side weakness, history of coronary artery disease, the patient presents with reports of fever and chills at home, the patient does not remember exactly how much her temperature is, but upon presentation here she was febrile at 99.8, had only mild leukocytosis at 11.7, the patient reports decreased appetite, decreased by mouth intake, generalized weakness, as well complaints of cough with mild productive sputum, her chest x-ray did not show any infiltrate.  Denies any polyuria or dysuria.  Therefore, she still making urine.  Urinalysis is still pending, the patient was given cefotaxime empirically in the ED, and the patient had peritoneal fluid sample sent as well, the hospitalist service were requested to admit the patient for further evaluation and work-up of her fever.   PAST MEDICAL HISTORY: 1.  End-stage renal disease on peritoneal dialysis.  2.  History of diabetes, but currently not taking any medicine as her blood sugar are controlled.  3.  Hypertension, not taking any medicine, currently controlled.  4.  History of CVA with left-sided weakness.  5.  History of coronary artery disease with a stent placement in 2010.  6.  History of congestive heart failure.  7.  History of peripheral vascular disease with left fourth and fifth toe amputation.  8.  Also chronic obstructive pulmonary disease.  9.  Anemia of chronic kidney disease.  10.  Gastroesophageal reflux disease.   PAST SURGICAL HISTORY:  1.  PD catheter.  2.  Cholecystectomy.  3.  Kidney biopsy.  4.  Left foot fourth  and fifth toe amputation.   ALLERGIES:  AS PER PREVIOUS HISTORY, REPORTS AN ALLERGY TO ASPIRIN, BUT SHE IS ACTUALLY TAKING ASPIRIN.   SOCIAL HISTORY:  Lives at home with her sister, helps take care of her niece.  She smokes 2 to 3 cigarettes every day.  No alcohol.  No illicit drug use.  She is on disability.   FAMILY HISTORY:  Significant for CVA and congestive heart failure.   HOME MEDICATION:  Aspirin 81 mg daily, Plavix 75 mg daily, loperamide 2 mg every four hours as needed, promethazine as needed, hydroxyzine as needed, calcium acetate 667 mg 2 capsules 3 times a day.  The patient reports she has not been taking them recently as she does not have good by mouth intake.  Protonix 40 mg oral daily.  Levothyroxine 25 mcg oral daily.  REVIEW OF SYSTEMS:  CONSTITUTIONAL:  The patient reports fever, chills, fatigue, weakness.  Denies weight gain, weight loss.  Reports poor appetite.  EYES:  Denies blurry vision, double vision, inflammation, glaucoma.  EAR, NOSE, THROAT:  Denies tinnitus, ear pain, hearing loss, epistaxis or discharge.  RESPIRATORY:  Denies any wheezing, hemoptysis, painful respiratory.  Reports cough with productive sputum.  CARDIOVASCULAR:  Denies chest pain, edema, palpitations, syncope.  GASTROINTESTINAL:  Denies nausea, vomiting, diarrhea, abdominal pain, hematemesis, melena.  GENITOURINARY:  Denies dysuria, hematuria, renal colic.  ENDOCRINE:  Denies polyuria, polydipsia, heat or cold intolerance.  HEMATOLOGY:  Denies anemia, easy bruising, bleeding diathesis.  INTEGUMENTARY:  Denies acne, rash or skin lesion.  MUSCULOSKELETAL:  Denies neck  pain, arthritis, cramps, swelling, gout.  NEUROLOGIC:  Denies dementia, headache, vertigo, tremors.  Reports CVA with residual left-sided weakness.  PSYCHIATRIC:  Denies anxiety or insomnia or depression.   PHYSICAL EXAMINATION: VITAL SIGNS:  Temperature 99.8, pulse 89, respiratory rate 18, blood pressure 140/71, saturating 98% on  room air.  GENERAL:  Well-nourished female who looks comfortable in bed, in no apparent distress.  HEENT:  Head atraumatic, normocephalic.  Pupils are equal, reactive to light.  Pink conjunctivae.  Anicteric sclerae.  Moist oral mucosa.  NECK:  Supple.  No thyromegaly.  No JVD.  No carotid bruits.  CHEST:  Good air entry bilaterally.  No wheezing, rales, rhonchi.  CARDIOVASCULAR:  S1, S2 heard.  No rubs, murmurs or gallops.  ABDOMEN:  Soft, nontender, nondistended.  Bowel sounds present.  No hepatosplenomegaly.  No bruits.  She has peritoneal dialysis catheter.  No evidence of erythema or discharge or infection around the site.  PSYCHIATRIC:  The patient is awake, alert x 3.  Intact judgment and insight.  NEUROLOGIC:  Cranial nerves grossly intact.  The patient had mild residual weakness in the left side.  EXTREMITIES:  Mild pitting edema bilaterally.  Pedal pulses decreased bilaterally, but palpable.  No evidence of ischemia or cyanosis.  Has amputation of left fourth and fifth toes.  SKIN:  Warm and dry.  Normal skin turgor.  LYMPHATIC:  No cervical lymphadenopathy could be appreciated.   PERTINENT LABORATORY DATA:  Glucose 185, BUN 26, creatinine 4.77, sodium 129, potassium 2.5, chloride 22, CO2 29.  White blood cells 11.7, hemoglobin 12, hematocrit 35.3, platelets 93.  Chest x-ray showing trace bilateral pleural effusion.  No edema or consolidation.   ASSESSMENT AND PLAN: 1.  Fever, at this point, unclear etiology, but this is most likely related to viral origin at patient so far work-up is negative.  Does not appear to be septic.  Her main complaint is cough and with mild productive sputum.  This is very likely viral syndrome versus acute bronchitis, but given the fact that she is a dialysis patient, we will send septic work-up including urinalysis and peritoneal fluid culture and blood cultures, meanwhile she will be kept on empiric antibiotic coverage with cefepime, there is no history of  methicillin resistant Staphylococcus aureus so we will not be doing any vancomycin currently.  2.  Hypokalemia.  We will replace.  We will recheck in a.m.  3.  End-stage renal disease.  The patient is on peritoneal dialysis.  We will consult nephrology service.  The patient does not appear to be in any volume overload currently, so no need for peritoneal dialysis this evening until she is seen by nephrology in the morning.  4.  Diabetes.  It appears to be controlled off medicine as per patient.  We will monitor fingersticks before meals.  5.  History of hypertension.  Blood pressure is acceptable off medication.  6.  History of cerebrovascular accident.  The patient is on aspirin and Plavix.  7.  History of coronary artery disease.  Continue with aspirin and Plavix.  8.  Gastroesophageal reflux disease.  Continue with PPI.  9.  Deep vein thrombosis prophylaxis.  SubQ heparin.  10.  CODE STATUS:  DISCUSSED WITH THE PATIENT, REPORTS SHE IS A FULL CODE.  11.  Hypothyroidism.  Continue with Synthroid.   Total time spent on admission and patient care 55 minutes.    ____________________________ Albertine Patricia, MD dse:ea D: 07/14/2013 23:55:39 ET T: 07/15/2013 00:50:46 ET JOB#: [id number]  cc: Albertine Patricia, MD, <Dictator> Mannix Kroeker Graciela Husbands MD ELECTRONICALLY SIGNED 07/15/2013 4:24

## 2014-10-05 NOTE — H&P (Signed)
PATIENT NAME:  Sherri Davidson, Sherri Davidson MR#:  [id number] DATE OF BIRTH:  August 25, 1955  DATE OF ADMISSION:  05/30/2014  ADDENDUM:    For diagnosis tobacco abuse the patient was counseled for smoking cessation for 3 minutes. We will give nicotine patch.      ____________________________ Demetrios Loll, MD qc:bu D: 05/30/2014 15:12:55 ET T: 05/30/2014 18:33:52 ET JOB#: [id number]  cc: Demetrios Loll, MD, <Dictator> Demetrios Loll MD ELECTRONICALLY SIGNED 05/31/2014 11:13

## 2014-10-06 NOTE — Op Note (Signed)
PATIENT NAME:  Sherri Davidson, Sherri Davidson MR#:  [id number] DATE OF BIRTH:  [birth year]  DATE OF PROCEDURE:  11/15/2011  PREOPERATIVE DIAGNOSES:  1. End-stage renal disease.  2. Colitis precluding the use for peritoneal dialysis catheter.   POSTOPERATIVE DIAGNOSES:    1. End-stage renal disease.  2. Colitis precluding the use for peritoneal dialysis catheter.   PROCEDURES:    1. Ultrasound guidance for vascular access, right jugular vein.  2. Fluoroscopic guidance for placement of catheter.  3. Placement of a right jugular tunneled hemodialysis catheter 19 cm tip to cuff.   SURGEON: Algernon Huxley, M.D.   ANESTHESIA: Local with moderate conscious sedation.   ESTIMATED BLOOD LOSS: Minimal.  FLUOROSCOPY TIME: Less than one minute.  CONTRAST USED: None.   INDICATION FOR PROCEDURE: 59 year old female who has been on peritoneal dialysis for end-stage renal disease. She now has colitis and will not be able to use peritoneal dialysis for approximately two months. For this reason, a PermCath will be necessary. Risks and benefits were discussed. Informed consent was obtained.   DESCRIPTION OF PROCEDURE: The patient was brought to the vascular interventional radiology suite. The right neck and chest were sterilely prepped and draped and a sterile surgical field was created. The right jugular vein was visualized with ultrasound and found to be widely patent. It was then accessed under direct ultrasound guidance without difficulty with a Seldinger needle. A J-wire was placed. After skin nick and dilatation, the peel-away sheath was placed over the wire and the wire and dilator were removed. I then anesthetized an area two fingerbreadths below the right clavicle, tunneled from the subclavicular incision to the access site. Using fluoroscopic guidance, I selected a 19 cm tip to cuff tunneled hemodialysis catheter and placed this through the peel-away sheath and the peel-away sheath was removed. The catheter tips were  placed into the right atrium. The appropriate distal connectors were placed and both lumens withdrew blood well and flushed easily with heparinized saline and concentrated heparin solution was placed. The access incision was closed with a single 4-0 Monocryl. A 4-0 Monocryl pursestring was placed around the exit site and two Prolene sutures were used to secure the catheter to the chest wall. Sterile dressings were then placed. The patient tolerated the procedure well and was taken to the recovery room in stable condition.   ____________________________ Algernon Huxley, MD jsd:ap D: 11/15/2011 14:01:54 ET T: 11/15/2011 14:44:16 ET JOB#: [id number]  cc: Algernon Huxley, MD, <Dictator> Algernon Huxley MD ELECTRONICALLY SIGNED 11/18/2011 13:14

## 2014-10-06 NOTE — Consult Note (Signed)
Brief Consult Note: Diagnosis: end stage renal disease.   Patient was seen by consultant.   Recommend to proceed with surgery or procedure.   Discussed with Attending MD.   Comments: will place permacath.  Electronic Signatures: Hortencia Pilar (MD)  (Signed 03-Jun-13 10:16)  Authored: Brief Consult Note   Last Updated: 03-Jun-13 10:16 by Hortencia Pilar (MD)

## 2014-10-06 NOTE — Consult Note (Signed)
Chief Complaint:   Subjective/Chief Complaint Colonoscopy done. No evidence of diverticulitis, colitis or fistula. Small bowel was intubated and appears normal. Three polyps removed. Random colonic biopsies were done. Mild diverticulosis. Small internal hemorrhoids.  Recommendations: Further recommendations per general surgery. Clear liquid diet, advance if no surgery is planned. If no surgery is planned, may be discharged on PO antibiotics to complete a 10-14 days course as long as OK with general surgery. Do not start Plavix for a week due to polypectomy. Patient to follow with me in 1 year as OP.   Electronic Signatures: Jill Side (MD)  (Signed 05-Jun-13 13:01)  Authored: Chief Complaint   Last Updated: 05-Jun-13 13:01 by Jill Side (MD)

## 2014-10-06 NOTE — Consult Note (Signed)
Brief Consult Note: Comments: Patient seen. Full consult to follow. Discussed with Dr. Leanora Cover. Further plan depending on patient's willingness to stay in the hospital. Meanwhile she seems to be responding well to antibiotics with a benign abdome. Will follow. Thanks.  Electronic Signatures: Jill Side (MD)  (Signed 03-Jun-13 20:53)  Authored: Brief Consult Note   Last Updated: 03-Jun-13 20:53 by Jill Side (MD)

## 2014-10-06 NOTE — H&P (Signed)
PATIENT NAME:  Sherri Davidson, Sherri Davidson MR#:  [id number] DATE OF BIRTH:  August 04, 1955  DATE OF ADMISSION:  11/12/2011  PRIMARY CARE PHYSICIAN: [facility], Dr. Derrek Monaco.   CHIEF COMPLAINT:  History was obtained from the patient and the sister. Has been having intractable vomiting for 2 to 3 days.   HISTORY OF PRESENT ILLNESS: A 59 year old female who has history of end-stage renal disease on peritoneal dialysis, diabetes, hypertension, history of CVA with left-sided weakness, coronary artery disease, history of congestive heart failure, peripheral vascular disease with left fourth and fifth toe amputated. Today she presented to the emergency room because she has been vomiting for last 2 to 3 days, unable to tolerate anything p.o. She is not even able to tolerate water; everything is coming out. She is dry heaving. She went to see her primary care physician at [facility] so she was transferred to the emergency room because of concern for early peritonitis, electrolyte imbalance. She got 8 mg of IV Zofran in the emergency room along with IV Phenergan but she is still retching. She denies any abdominal pain but mainly complaining of retching with the vomiting. She denies any urinary complaints of dysuria or frequency. No fever or chills. She said she had some diarrhea before the vomiting started but the diarrhea is better now. She denies any blood in stools. No blood in the vomiting. No shortness of breath. She has some back pain also but she says it is in the whole back.   In the emergency room her initial workup showed that she has a normal white count of 9.4, but her urinalysis is extremely pyuric with 3+ leukocyte esterase and 3653 WBCs per high-power field. Urine culture has been sent and she has been started on IV ceftriaxone. CT abdomen and pelvis has questionable diverticulitis versus colitis.    REVIEW OF SYSTEMS: Positive for weakness but no fever. HEENT: No acute change in vision. No  headache. No dizziness. RESPIRATORY: No cough. No dyspnea. CARDIOVASCULAR: No chest pain. No dyspnea on exertion. GI: Complaining of nausea and vomiting. No abdominal pain. No GI bleed. The diarrhea has improved now. She denies any dysuria or frequency. ENDOCRINE: No thyroid problems. She has history of anemia. INTEGUMENT: No rash. MUSCULOSKELETAL: No joint pains. Mainly complaining of back pain. No focal numbness, no anxiety.   PAST MEDICAL HISTORY:  1. End-stage renal disease on peritoneal dialysis.   2. Diabetes.  3. Hypertension.  4. History of cerebrovascular accident with left weakness.   5. Coronary artery disease with previous stent placement in 2010.   6. History of congestive heart failure.  Last cardiac catheterization on record is in October of 2010 showed the patient has significant double vessel coronary artery disease with successful percutaneous coronary intervention.  7. History of peripheral vascular disease with left 4th and 5th toe amputated.  8. Also chronic obstructive pulmonary disease, anemia of chronic kidney disease, gastroesophageal reflux disease.   PAST SURGICAL HISTORY: PD catheter, cholecystectomy. She had a kidney biopsy done.  Left foot 4th and 5th toe amputation.   ALLERGIES: To aspirin but the patient is taking aspirin.    HOME MEDICATIONS AS PER THE LIST OBTAINED FROM PRIMARY CARE PHYSICIAN'S OFFICE:  1. Protonix 40 mg daily.  2. Loperamide 2 mg as needed for loose stools.   3. Aspirin 81 mg daily.   4. Plavix 75 mg daily.   5. Januvia 100 mg daily.  6. Simvastatin 40 mg daily.  7. Hydroxyzine 25 mg q.8 hours  p.r.n.  8. Potassium chloride 20 mEq daily.   SOCIAL HISTORY: She lives with her sister. She smokes about 2 to 3 cigarettes a day. She was a heavy smoker in the past. No alcohol use. No drug use. She is on disability.   FAMILY HISTORY: Father and mother both had stroke. Mother had congestive heart failure.   PHYSICAL EXAMINATION:  VITAL SIGNS:  Her vitals when she presented to the emergency room, temperature 97.4, heart rate of 88, respiratory rate 20, blood pressure 170/93, saturation 100% on room air.   GENERAL: This is a middle-aged Caucasian female. She is still extremely nauseated and retching at this time.   HEENT: Bilateral pupils are equal. Extraocular muscles intact. No scleral icterus. No conjunctivitis. Oral mucosa is moist. No pallor.   NECK: No thyroid tenderness, enlargement or nodule. Neck is supple. No masses, nontender. No adenopathy. No JVD. No carotid bruit.   CHEST: Bilateral breath sounds are clear. No wheeze. Normal effort. No respiratory distress.   HEART: Heart sounds are regular. No murmur. Peripheral pulses are poor, evidence of peripheral vascular disease.   ABDOMEN: Soft, nontender. Normal bowel sounds. No hepatomegaly. No bruit. No masses. She has a PD catheter in.  There is no evidence of edema or discharge around it. There is no tenderness, no left lower quadrant tenderness.   NEUROLOGIC: She is awake, alert. Cranial nerves intact.  She has some residual weakness on the left side.   SKIN: No rash. No lesions. She has evidence of peripheral vascular disease in the lower extremities. She had amputation of left 4th and 5th toes.   LABORATORY WORK: She has a white count of 9.4, hemoglobin of 12.7, platelet count of 301,000. BMP: Sodium of 138, potassium of 4.1, BUN of 21, creatinine of 4.96. Lipase is 87.  UA: Turbid urine with 3+leukocyte esterase and 3653 WBCs.  Urine culture is sent. CT abdomen/pelvis: Diverticulitis and colitis cannot be excluded on the left side.   IMPRESSION:  1. Intractable vomiting, most likely secondary to complicated urinary tract infection. Cannot rule out diverticulitis, colitis on the left side but less likely.   2. End-stage renal disease, on peritoneal dialysis.   3. Diabetes.   4. Hypertension.   5. History of cerebrovascular accident with left-sided weakness.    6. Coronary artery disease.   7. Peripheral vascular disease.  8. Hyperlipidemia.   PLAN: A 59 year old female who has end-stage renal disease on peritoneal dialysis, coronary artery disease, peripheral vascular disease, chronic obstructive pulmonary disease, history of cerebrovascular accident, diabetes, hypertension presented with 2 to 3 days of vomiting, intractable, unable to tolerate anything p.o.  I am going to place her n.p.o. She got 1 liter of fluid in the emergency room but she is a dialysis patient, would not give her more fluids. We will continue IV ceftriaxone.  Her urine culture has been sent. She has no abdominal pain or tenderness on exam but her CT abdomen is questionable colitis versus diverticulitis which is less likely, but I will cover her with Flagyl also. White count is normal, less likely to be peritonitis but with the peritoneal dialysis we will also send PD fluid for cell count. Nephrology will be consulted. We will also give her IV PPI.  We will give her IV Zofran and Phenergan for nausea/vomiting. Continue her aspirin, Plavix, statin.  We will hold her Januvia as patient is n.p.o. at this time. We will place her on sliding scale coverage. We will also hold her potassium as  extremely she is vomiting right now and that could further cause gastric irritability. Plan was discussed with the patient.   TIME SPENT ON ADMISSION AND COORDINATION:  50 minutes.     ____________________________ Mena Pauls, MD ag:vtd D: 11/12/2011 19:52:13 ET T: 11/13/2011 07:01:08 ET JOB#: [id number]  cc: Mena Pauls, MD, <Dictator> Danelle Berry. Derrek Monaco, MD Tama High, MD Mena Pauls MD ELECTRONICALLY SIGNED 11/30/2011 12:34

## 2014-10-06 NOTE — Consult Note (Signed)
PATIENT NAME:  Sherri Davidson, Sherri Davidson MR#:  [id number] DATE OF BIRTH:  13-Aug-1955  DATE OF CONSULTATION:  11/16/2011  REFERRING PHYSICIAN:  Molly Maduro, MD  CONSULTING PHYSICIAN:  Sherri Side, MD  REASON FOR CONSULTATION: Questionable enterovesical fistula.   HISTORY OF PRESENT ILLNESS: This is a 59 year old female with history of end-stage renal disease on peritoneal dialysis, diabetes, hypertension, history of CVA with left-sided weakness, coronary artery disease, congestive heart failure, and peripheral vascular disease who presented to the Emergency Room with 2 to 3 days history of nausea and vomiting. According to the patient, she is unable to keep anything down. She has been retching and dry heaving. The patient also reported some diarrhea prior to the episode. She denied any significant abdominal pain. Her white cell count was normal but urinalysis was very abnormal with white cell count of more than 3600 and 3+ leukocyte esterase. Later on she also reported to Dr. Leanora Davidson of passing what she felt like air while urinating. CT scan of the abdomen and pelvis showed some thickening of the left colon raising concerns about possible colitis or diverticulitis. No fistula was noted, although on further evaluation of the CT scan by Dr. Leanora Davidson it appears that a couple of loops of small bowel are attached or resting on the urinary bladder raising concern about possible abnormal connection in that area between the bowel and the bladder. This is further supported by the patient's history of suspected pneumaturia as well as a very high white cell count on urinalysis. The patient has had a colonoscopy about 2 or 3 years ago where polyps were removed and no other abnormalities were seen according to her. Since admission she seems to be doing much better. She has had no further nausea or vomiting. She denies any abdominal pain. She has been having bowel movements which are somewhat loose but denies frank  diarrhea.   PAST MEDICAL HISTORY: As above.   PAST SURGICAL HISTORY:  1. History of placement of PD catheter. 2. Cholecystectomy.  3. Kidney biopsy. 4. Left fourth and fifth toe amputation.   ALLERGIES: Aspirin.    MEDICATIONS AT HOME:  1. Protonix.  2. Loperamide. 3. Aspirin. 4. Plavix. Last dose of Plavix was two days ago.  5. Januvia. 6. Simvastatin. 7. Hydroxyzine. 8. Potassium chloride.  SOCIAL HISTORY: She smokes about 2 to 3 cigarettes a day. She used to be a heavy smoker. Does not drink.   FAMILY HISTORY: Unremarkable.   REVIEW OF SYSTEMS: Grossly negative except for what is mentioned in the history of present illness.   PHYSICAL EXAMINATION:   GENERAL: Well built female.  VITAL SIGNS: Low-grade fever of 99.3, pulse around 100, respirations 18, blood pressure 116/77.   HEENT: Unremarkable.   NECK: Neck veins are flat.   LUNGS: Clear to auscultation bilaterally.   CARDIOVASCULAR: Regular rate and rhythm. No gallops or murmur.   ABDOMEN: Abdomen showed PD catheter in place. Abdomen is otherwise very soft and benign. Bowel sounds are positive. No significant tenderness, rebound, or guarding was noted.   EXTREMITIES: No edema.   NEUROLOGIC: Appears to be unremarkable.   LABORATORY, DIAGNOSTIC, AND RADIOLOGICAL DATA: White cell count 9.5, hemoglobin 11.4, hematocrit 33.8. Platelet function test is normal. Creatinine is 4.94.   CT scan of abdomen as above.   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 Possible etiologies include inflammatory bowel disease such as  Crohn's disease versus an infectious process such as diverticulitis. Ischemia would be less likely. The patient's abdominal examination is very benign and she is now fairly asymptomatic on antibiotics. Dr. Leanora Davidson has requested a colonoscopy for further evaluation of her  condition followed by surgery if needed. This has been discussed with the patient in detail. The procedure of colonoscopy was again explained to her along with its complications especially in view of the fact that she recently had either colitis or diverticulitis. Visceral perforation is higher than normal. She is in full agreement and will proceed with a colonoscopy tomorrow. Will try to avoid biopsies, although her platelet function tests are normal and, if required, biopsy can still be performed. Surgery has been planned for Thursday if needed. Further recommendations to follow.   Thank you so much Dr. Leanora Davidson for involving me in the care of [name].   ____________________________ Sherri Side, MD si:drc D: 11/16/2011 18:41:16 ET T: 11/17/2011 08:50:08 ET JOB#: [id number]  cc: Sherri Side, MD, <Dictator> Sherri Davidson. Sherri Miles, MD Sherri Mimes, MD Sherri Side MD ELECTRONICALLY SIGNED 11/18/2011 12:18

## 2014-10-06 NOTE — Op Note (Signed)
PATIENT NAME:  Sherri Davidson, STILLE MR#:  [id number] DATE OF BIRTH:  [birth year]  DATE OF PROCEDURE:  12/14/2011  PREOPERATIVE DIAGNOSIS:  Catheter malfunction, tunneled dialysis catheter.   POSTOPERATIVE DIAGNOSIS: Catheter malfunction, tunneled dialysis catheter.   PROCEDURE PERFORMED: TPA infusion for catheter clearance.   SURGEON: Hortencia Pilar, M.D.   DESCRIPTION OF PROCEDURE: The patient is brought to Special Procedures holding area. 2 mg of TPA is reconstituted in 50 mL. Two aliquots are prepared. Simultaneous infusion through both lumens of the cuffed tunneled dialysis catheter is then performed for 2-1/2 to 3 hours. At the conclusion of the TPA infusion, I personally aspirated and flushed both lumens and then packed the catheter with 5000 units of heparin per lumen. Catheter function has been restored. The patient tolerated the procedure well and there are no immediate complications. She is discharged to home and will follow up for her usual dialysis schedule.   ____________________________ Katha Cabal, MD ggs:bjt D: 12/14/2011 14:14:23 ET T: 12/14/2011 14:51:56 ET JOB#: [id number]  cc: Katha Cabal, MD, <Dictator> Katha Cabal MD ELECTRONICALLY SIGNED 12/21/2011 12:23

## 2014-10-06 NOTE — Consult Note (Signed)
General Aspect renal failure requiring dialysis    Present Illness The patient is a 59 year old female who has history of end-stage renal disease currently maintained on  peritoneal dialysis who presented with worsening abdominal pain. She presented to the emergency room because of  vomiting for the last 2 to 3 days.  She had seen her primary care physician at [facility] afterwhich she was transferred to the emergency room because of concern for early peritonitis, electrolyte imbalance.  She denies any urinary complaints of dysuria or frequency. No fever or chills. She said she had some diarrhea before the vomiting started but the diarrhea is better now. She denies any blood in stools. No blood in the vomiting. No shortness of breath. Work up is consistent with diverticulitis and abcess formation.  Therefore peritoneal dialysis is not a viabel mode of dialysis and she requires access for hemodialysis.  PAST MEDICAL HISTORY:  1. End-stage renal disease on peritoneal dialysis.   2. Diabetes.  3. Hypertension.  4. History of cerebrovascular accident with left weakness.   5. Coronary artery disease with previous stent placement in 2010.   6. History of congestive heart failure.  Last cardiac catheterization on record is in October of 2010 showed the patient has significant double vessel coronary artery disease with successful percutaneous coronary intervention.  7. History of peripheral vascular disease with left 4th and 5th toe amputated.  8. Also chronic obstructive pulmonary disease, anemia of chronic kidney disease, gastroesophageal reflux disease.   Home Medications: Medication Instructions Status  Plavix 75 mg oral tablet 1 tab(s) orally once a day (in the morning) Active  simvastatin 40 mg oral tablet 1  orally once a day (in the morning) Active  Aspirin 61m 1   once a day (in the morning) Active  ramipril 5 mg 1 tab(s) orally 2 times a day Active  pantoprazole 40 mg oral delayed  release tablet 1 tab(s) orally once a day in AM Active  Percocet 5/325 oral tablet 1 tab(s) orally every 6 hours, As Needed Active  Januvia 100 mg oral tablet 1 tab(s) orally once a day Active  carvedilol 12.5 mg oral tablet 1 tab(s) orally 2 times a day Active  vancomycin 250 mg oral capsule 1 cap(s) orally every 6 hours Active  levaquin    Active  Lasix 20 mg oral tablet 1 tab(s) orally every other day in AM Active    Aspirin: Rash, N/V/Diarrhea  Case History:   Family History Non-Contributory    Social History negative tobacco, negative ETOH, negative Illicit drugs   Review of Systems:   Fever/Chills No    Cough No    Sputum No    Abdominal Pain No    Diarrhea No    Constipation No    Nausea/Vomiting No    SOB/DOE No    Chest Pain No    Telemetry Reviewed NSR    Dysuria No   Physical Exam:   GEN well developed, well nourished, no acute distress    HEENT pink conjunctivae, PERRL, hearing intact to voice    NECK supple  trachea midline    RESP normal resp effort  no use of accessory muscles    CARD regular rate  No LE edema    ABD positive tenderness  soft  PD cath present    EXTR negative cyanosis/clubbing, negative edema    SKIN normal to palpation, No rashes    NEURO cranial nerves intact, follows commands, motor/sensory function intact  PSYCH alert, A+O to time, place, person, good insight   Nursing/Ancillary Notes: **Vital Signs.:   03-Jun-13 09:56   Vital Signs Type Q 4hr   Temperature Temperature (F) 97.5   Celsius 36.3   Temperature Source axillary   Pulse Pulse 82   Pulse source if not from Vital Sign Device per [employer]   Respirations Respirations 20   Systolic BP Systolic BP 884   Diastolic BP (mmHg) Diastolic BP (mmHg) 78   Mean BP 96   BP Source  if not from Vital Sign Device vital sign device   Pulse Ox % Pulse Ox % 96   Pulse Ox Activity Level  At rest   Oxygen Delivery Room Air/ 21 %   BF Analysis:  01-Jun-13 16:10     Body Fluid Source (CC) PERITONEAL FLUID   Color - (BF) COLORLESS   Clarity (BF) CLEAR   NCC  (nucleated cell count) 185   Neutrophils  (BF) 3   Lymphocytes  (BF) 29   Monocytes/Macrophages  (BF) 67   Eosinophil  (BF) 1   Basophil  (BF) 0   Other Cells  (BF) 0  LabUnknown:  01-Jun-13 16:10    Result Interpretation Peritoneal fluid smear reviewed. Smear shows lymphocytes, neutrophils, and reactive mesothelial cells. No malignant cells identified. Inda Castle, MD.  Routine Micro:  01-Jun-13 16:10    Micro Text Report BODY FLUID CULTURE   COMMENT                   NO GROWTH AEROBICALLY/ANAEROBICALLY IN 4 DAYS   GRAM STAIN                FEW WHITE BLOOD CELLS   GRAM STAIN                NO ORGANISMS SEEN   ANTIBIOTIC                         Specimen Source PERITONEAL FLUID   Culture Comment NO GROWTH AEROBICALLY/ANAEROBICALLY IN 4 DAYS   Gram Stain 1 FEW WHITE BLOOD CELLS   Gram Stain 2 NO ORGANISMS SEEN  Result(s) reported on 17 Nov 2011 at 08:10AM.  General Ref:  02-Jun-13 00:04    Parathyroid Hormone, Intact ========== TEST NAME ==========  ========= RESULTS =========  = REFERENCE RANGE =  PTH INTACT PTH, Intact PTH, Intact No plasma specimen received. [facility] notified 11/15/2011-[place]               [facility]            No: [id number]         [address] [place], [place] [address]           Lindon Romp, MD         [phone]   Routine Chem:  01-Jun-13 04:42    Glucose, Serum  108   BUN  23   Creatinine (comp)  4.94   Sodium, Serum 142   Potassium, Serum 3.5   Chloride, Serum  108   CO2, Serum 21   Calcium (Total), Serum  7.8   Anion Gap 13   Osmolality (calc) 287   eGFR (African American)  11   eGFR (Non-African American)  9 (eGFR values <36m/min/1.73 m2 may be an indication of chronic kidney disease (CKD). Calculated eGFR is useful in patients with stable renal function. The eGFR calculation will not be reliable in acutely ill  patients when serum creatinine  is changing rapidly. It is not useful in  patients on dialysis. The eGFR calculation may not be applicable to patients at the low and high extremes of body sizes, pregnant women, and vegetarians.)    16:10    Result Comment DIFFERENTIAL - PATHOLOGIST TO REVIEW SMEAR. COMMENTS  - APPEAR ON REPORT WHEN COMPLETE.  Result(s) reported on 13 Nov 2011 at 05:52PM.  02-Jun-13 00:04    Result Comment PTH INTACT - CANCEL-DUE TO IMPROPER PROCESSING OF  - SPECIMEN. NOTIFIED N.MINOR 6/3 1415 OF  - PROBLEM AND TO REORDER IF STILL  - NEED PTH INTACT PERFORMED. KDM  Result(s) reported on 15 Nov 2011 at 02:24PM.   Phosphorus, Serum 4.5 (Result(s) reported on 14 Nov 2011 at 12:49AM.)  06-Jun-13 04:26    eGFR (African American)  22  Routine Hem:  01-Jun-13 04:42    WBC (CBC) 9.5   RBC (CBC)  3.66   Hemoglobin (CBC)  11.4   Hematocrit (CBC)  33.8   Platelet Count (CBC) 262   MCV 92   MCH 31.3   MCHC 33.9   RDW 13.3   Neutrophil % 66.9   Lymphocyte % 24.9   Monocyte % 7.0   Eosinophil % 0.2   Basophil % 1.0   Neutrophil # 6.4   Lymphocyte # 2.4   Monocyte # 0.7   Eosinophil # 0.0   Basophil # 0.1 (Result(s) reported on 13 Nov 2011 at 05:29AM.)     Impression 1. End-stage renal disease, on peritoneal dialysis.                  The patient will require short term conversion from peritoneal to hemodialysis                  Perma cath will be place at this time 2.  Diverticulitis                   plan per general surgery                   on antibiotics 3. Diabetes.                    sliding scale insulin                    changes per [facility]   4. Hypertension.                       continue antihypertensives                     adjustments per medical service 5. History of cerebrovascular accident with left-sided weakness.                      continue antiplatelet medication   6. Coronary artery disease.                         Nitrates prn     Plan level 4 consult   Electronic Signatures: Hortencia Pilar (MD)  (Signed 10-Jul-13 15:55)  Authored: General Aspect/Present Illness, Home Medications, Allergies, History and Physical Exam, Vital Signs, Labs, Impression/Plan   Last Updated: 10-Jul-13 15:55 by Hortencia Pilar (MD)

## 2014-10-06 NOTE — Consult Note (Signed)
Chief Complaint:   Subjective/Chief Complaint Feels well. No complaints. Colonoscopy tomorrow. Further recommendations to follow.   Electronic Signatures: Jill Side (MD)  (Signed 04-Jun-13 18:41)  Authored: Chief Complaint   Last Updated: 04-Jun-13 18:41 by Jill Side (MD)

## 2014-10-06 NOTE — Op Note (Signed)
PATIENT NAME:  Sherri Davidson, Sherri Davidson MR#:  [id number] DATE OF BIRTH:  09-14-1955  DATE OF PROCEDURE:  11/18/2011  PREOPERATIVE DIAGNOSIS: Enterovesical fistula.   POSTOPERATIVE DIAGNOSIS: Large urachal cyst.   SURGEON: Consuela Mimes, MD  FIRST ASSISTANT: Dr. Sherri Rad   PROCEDURES PERFORMED:  1. Diagnostic laparoscopy.  2. Laparoscopic enterolysis.   ANESTHESIA: General.   INDICATIONS FOR PROCEDURE: Patient has end-stage renal disease and was admitted to the hospital with intractable vomiting and found to have history of pneumaturia and a severe urinary tract infection with over 3000 white blood cells per high-power field. Klebsiella grew from her urine culture. It was felt that she likely had an enterovesical fistula as she had a significant amount of air in her bladder on CT scan examination. Patient underwent colonoscopy yesterday which was essentially normal and this showed some small mouth diverticula in the sigmoid colon without diverticulitis and therefore it was elected to proceed with diagnostic laparoscopy prior to undertaking a laparotomy for her presumed fistula.   PROCEDURE IN DETAIL: The patient was placed in the lithotomy position and prepped and draped in the usual sterile fashion. A 15 mmHg CO2 pneumoperitoneum was created via a Veress needle in the supraumbilical midline and this was replaced with a 5 mm trocar and a 30 degree angled laparoscope. Two additional 5 mm trocars were placed. The peritoneal dialysis catheter was left in place. There were some adhesions from the sigmoid colon to the anterior abdominal wall near the area where the bladder should be reflected and these were taken down sharply with the aid of the electrocautery. There was no inflammation surrounding the colon or the small bowel near the area where it would be connected to the bladder. The uterus appeared normal, both fallopian tubes and ovaries appeared normal. The circulating nurse then added 400 mL of  sterile saline to the Foley catheter and there was no bulge in the typical location for a bladder. Rather there was a large bulge between the uterus and the umbilicus consistent with a large urachal cyst. Laparoscopic photographs were obtained. The peritoneum was then desufflated and decannulated and all three skin sites were closed with subcuticular 5-0 Monocryl and suture strips. It was felt that the patient could resume her peritoneal dialysis after just a couple of weeks since she only had 5 mm trocars placed.    ____________________________ Consuela Mimes, MD wfm:cms D: 11/18/2011 08:58:38 ET T: 11/18/2011 10:03:13 ET JOB#: [id number]  cc: Consuela Mimes, MD, <Dictator> Consuela Mimes MD ELECTRONICALLY SIGNED 11/18/2011 20:45

## 2014-10-06 NOTE — Consult Note (Signed)
PATIENT NAME:  Sherri Davidson, Sherri Davidson MR#:  [id number] DATE OF BIRTH:  Nov 06, 1955  DATE OF CONSULTATION:  11/13/2011  REFERRING PHYSICIAN:   CONSULTING PHYSICIAN:  Consuela Mimes, MD  HISTORY OF PRESENT ILLNESS: Sherri Davidson is a 59 year old white female who had a 2 to 3 day history of intractable vomiting and she sought attention in the Emergency Department. She was found to have pyuria with 3+ leukocyte esterase and greater than 3000 white blood cells per high-power field on her urinalysis. CT scan of the abdomen and pelvis was obtained without IV contrast and it showed air in the bladder as well as diverticulitis versus sigmoid colitis and left colitis. By my review of that CT scan she also had calcified aorta and calcified iliac arteries. The patient's urine culture was found to have greater than 100,000 gram-negative rods and she has been treated with IV Rocephin in the hospital and has had dramatic improvement in her vomiting. She in fact is interested in being discharged from the hospital. On presentation her white blood cell count was 9.4 and that remained stable until today. Electrolytes are essentially normal with the exception of her BUN of low 20s and creatinine of approximately 5.0.   On further questioning the patient does admit to having about one week history of pneumaturia and gives a classic history of passing gas through her urethra near the end of her stream. She does make urine despite her end-stage renal disease. She specifically denies having any blood in her bowel movements or passing any melena or having any mucous in her stool and says that her baseline is to have one normally formed bowel movement per day.   PAST MEDICAL HISTORY:  1. End-stage renal disease (on peritoneal dialysis).  2. Diabetes mellitus.  3. Hypertension.  4. History of cerebrovascular accident with residual left-sided weakness.  5. Coronary artery disease (status post stent placement 2010).  6. History of  congestive heart failure.  7. Peripheral vascular occlusive disease.  8. Status post left fourth and fifth toe amputations.  9. Chronic obstructive pulmonary disease.  10. Anemia of chronic kidney disease.  11. Gastroesophageal reflux disease.    PAST SURGICAL HISTORY:  1. Peritoneal dialysis catheter placement.  2. Cholecystectomy.  3. Kidney biopsy.  4. Left fourth and fifth toe amputations.   ALLERGIES: None.   MEDICATIONS:  1. Protonix 40 mg daily.  2. Loperamide 2 mg p.r.n. loose stools.  3. Aspirin 81 mg daily.  4. Plavix 75 mg daily.  5. Januvia 100 mg daily.  6. Simvastatin 40 mg daily.  7. Hydroxyzine 25 mg q.8 hours p.r.n.  8. Potassium chloride 20 mEq daily.   SOCIAL HISTORY: The patient lives at home and her sister lives with her. She smokes only about three cigarettes per day. She was a heavy smoker in the past. She denies alcohol use and is on disability.   FAMILY HISTORY: Noncontributory.   REVIEW OF SYSTEMS: Negative for 10 systems except as mentioned in the history of present illness above. Specific pertinent negatives are lack of diarrhea, mucus in her stools, left lower quadrant abdominal pain, fever.   PHYSICAL EXAMINATION:  GENERAL: Comfortably sleeping elderly appearing white female, weight 156 pounds.   VITAL SIGNS: Temperature 97.9, pulse 84, respirations 18, blood pressure 175/80, oxygen saturation 98% on room air.   HEENT: Pupils equally round and reactive to light. Extraocular movements intact. Sclerae nonicteric. Oropharynx clear. Mucous membranes moist.   NECK: Supple with no jugular venous distention or tracheal  deviation.   HEART: Regular rate and rhythm with no murmurs or rubs.   LUNGS: Clear to auscultation with normal respiratory effort bilaterally.   ABDOMEN: (Examined dry as patient currently does not have a peritoneal dialysate dwell) soft, nondistended with mild tenderness to very deep palpation in the left lower quadrant.  Specifically, the suprapubic region is completely nontender as are other areas of the abdomen.   EXTREMITIES: No edema with normal capillary refill bilaterally.   NEUROLOGIC: Cranial nerves II through XII, motor and sensation grossly intact.   PSYCHIATRIC: Alert and oriented x4. Appropriate affect.   ASSESSMENT: Enterovesical fistula. Potential causes of this would be a left-sided colitis that could be from ischemic colitis since the patient has peripheral vascular occlusive disease and aortoiliac calcifications. It is also possible that she has diverticular disease as a cause of this and my review of the CT scan shows an area of small intestine that is intimately associated with and is impinging on the bladder. The patient was apprised of the fact that this has caused her to have severe pyuria and although she already has end-stage renal disease would be best to have this worked up and ultimately repaired surgically. In order for her to undergo repair, she would have to take a hiatus from her peritoneal dialysis and be maintained on hemodialysis while she healed and she would also require stopping her Plavix for a period of approximately a week prior to undergoing surgery. Patient understands this and is adamant about being discharged home from the hospital prior to undertaking any further evaluation. Therefore, I gave her my card and will schedule her to see me in the office and hopefully be able to obtain adequate preoperative evaluation if she is interested in proceeding with surgery.   ____________________________ Consuela Mimes, MD wfm:cms D: 11/13/2011 21:41:19 ET T: 11/14/2011 10:42:07 ET JOB#: [id number]  cc: Consuela Mimes, MD, <Dictator> Munsoor Lilian Kapur, MD Consuela Mimes MD ELECTRONICALLY SIGNED 11/15/2011 21:46

## 2014-10-06 NOTE — Consult Note (Signed)
Brief Consult Note: Diagnosis: enterovesical fistula.   Patient was seen by consultant.   Consult note dictated.   Recommend to proceed with surgery or procedure.   Recommend further assessment or treatment.   Orders entered.   Comments: Pt has a 1 week h/o pneumaturia thta precedes her admission CT. Likely causes are diverticulitis or colitis (which could be ischemic since she has calcified Aorta and Iliac arteries and PVOD), but sh ealso has an area of small bowel that is indenting her bladder. She needs a colonoscopy and repeat CT scan with rectal (and IV, if OK with nephrology) contrast prior to surgery. She would have to have her Plavix stopped for 1 week and would need temporary HD access until she heals. She is adamant about hospital D/C in the AM, so I gave her my card and hopefully can evaluate her as an outpatient.  Electronic Signatures: Consuela Mimes (MD)  (Signed 01-Jun-13 21:46)  Authored: Brief Consult Note   Last Updated: 01-Jun-13 21:46 by Consuela Mimes (MD)

## 2014-10-06 NOTE — Discharge Summary (Signed)
PATIENT NAME:  Sherri Davidson, Sherri Davidson MR#:  [id number] DATE OF BIRTH:  May 11, 1956  DATE OF ADMISSION:  11/12/2011 DATE OF DISCHARGE:  11/18/2011  DISCHARGE DIAGNOSES:  1. Colitis.  2. Urinary tract infection.  3. End-stage renal disease on peritoneal dialysis.   PROCEDURES:  1. Colonoscopy on 11/17/2011 by Dr. Dionne Milo. 2. Laparoscopy 11/18/2011 by Dr. Leanora Cover.    DISCHARGE MEDICATIONS: Resume home medications.   ADDITIONAL MEDICATION: Levaquin 250 mg daily x5 days.   HOSPITAL COURSE: Sherri Davidson was admitted through the Emergency Room with nausea and vomiting. Clinical work-up revealed colitis with urinary tract infection. Please see history and physical for details. Patient was placed on antiemetics, broad-spectrum antibiotics with some clinical improvement. Her imaging studies suggested possible peritonitis. There was a question of colonic fistula due to air in the bladder. She underwent laparoscopy which did not show any evidence of this. She also underwent a colonoscopy by Dr. Dionne Milo which only revealed polyps. Due to suspicion of peritonitis patient'Sherri Davidson peritoneal dialysis was suspended. A Perm-A-Cath was placed by vascular surgery and patient received hemodialysis during the hospitalization. Following the laparoscopy which did hot show evidence of peritonitis or fistula formation she has been cleared to resume peritoneal dialysis, however, nephrology has decided to continue this for another month after she leaves the hospital. Patient being discharged to home in satisfactory condition.   CONSULTATIONS:  1. Vascular surgery, Dr. Delana Meyer.  2. Nephrology Drs. Munsoor Lateef and [employer].  3. General surgery, Dr. Leanora Cover. 4. Gastroenterology, Dr. Dionne Milo.   DIET: ADA diet, renal diet.   ACTIVITY: As tolerated.   FOLLOW UP: Follow up with Dr. Elijio Miles in 1 to 2 weeks and with nephrology at hemodialysis.   ____________________________ Sherri Davidson Elijio Miles, MD sat:cms D: 11/18/2011 13:56:24  ET T: 11/18/2011 17:21:20 ET JOB#: [id number]  cc: Alfredia Ferguson A. Elijio Miles, MD, <Dictator> Sherri Fells MD ELECTRONICALLY SIGNED 12/13/2011 13:32

## 2014-10-06 NOTE — Consult Note (Signed)
Chief Complaint:   Subjective/Chief Complaint OP note reviewed. Ptient seems to be doing well without any complaints.   VITAL SIGNS/ANCILLARY NOTES: **Vital Signs.:   06-Jun-13 09:58   Vital Signs Type Q 4hr   Temperature Temperature (F) 97.4   Celsius 36.3   Temperature Source oral   Pulse Pulse 107   Pulse source per vital sign device   Respirations Respirations 20   Systolic BP Systolic BP 92   Diastolic BP (mmHg) Diastolic BP (mmHg) 56   Mean BP 68   BP Source vital sign device   Pulse Ox % Pulse Ox % 96   Pulse Ox Activity Level  At rest   Oxygen Delivery Room Air/ 21 %   Lab Results: Routine Chem:  06-Jun-13 04:26    Glucose, Serum 91   BUN  6   Creatinine (comp)  2.69   Sodium, Serum 143   Potassium, Serum  3.1   Chloride, Serum 106   CO2, Serum 26   Calcium (Total), Serum  7.3   Anion Gap 11   Osmolality (calc) 282   eGFR (African American)  22   eGFR (Non-African American)  19 (eGFR values <61m/min/1.73 m2 may be an indication of chronic kidney disease (CKD). Calculated eGFR is useful in patients with stable renal function. The eGFR calculation will not be reliable in acutely ill patients when serum creatinine is changing rapidly. It is not useful in  patients on dialysis. The eGFR calculation may not be applicable to patients at the low and high extremes of body sizes, pregnant women, and vegetarians.)  Routine Hem:  06-Jun-13 04:26    WBC (CBC) 6.8   RBC (CBC)  3.26   Hemoglobin (CBC)  10.2   Hematocrit (CBC)  30.2   Platelet Count (CBC)  146   MCV 93   MCH 31.3   MCHC 33.8   RDW 13.3   Neutrophil % 44.1   Lymphocyte % 40.3   Monocyte % 12.1   Eosinophil % 3.1   Basophil % 0.4   Neutrophil # 3.0   Lymphocyte # 2.7   Monocyte # 0.8   Eosinophil # 0.2   Basophil # 0.0 (Result(s) reported on 18 Nov 2011 at 05:27AM.)   Assessment/Plan:  Assessment/Plan:   Assessment ? coliti, resolving. No evidence of entero-vesical fistula. Colon polyps  s/p resection.    Plan No further recommendations. Patient shoud complete a week of antibiotics. Follow up with me in 1 year as she will need repeat colonoscopies for surveillance. Will sign off. Please call on call Gi if needed. Thanks.   Electronic Signatures: IJill Side(MD)  (Signed 06-Jun-13 12:14)  Authored: Chief Complaint, VITAL SIGNS/ANCILLARY NOTES, Lab Results, Assessment/Plan   Last Updated: 06-Jun-13 12:14 by IJill Side(MD)

## 2014-10-09 NOTE — Discharge Summary (Signed)
PATIENT NAME:  Sherri Davidson, Sherri Davidson MR#:  [id number] DATE OF BIRTH:  August 31, 1955  DATE OF ADMISSION:  05/30/2014 DATE OF DISCHARGE:  05/31/2014  ADMITTING PHYSICIAN: Demetrios Loll, MD.   DISCHARGING PHYSICIAN: Gladstone Lighter, MD.   PRIMARY CARE PHYSICIAN: Dorthea Cove, MD.  [place]:  1. GI consultation with Dr. Arther Dames.  2. Nephrology consultation with Dr. Murlean Iba.   DISCHARGE DIAGNOSES: 1. Acute infectious gastroenteritis.  2. Questionable transient hematemesis.  3. End-stage renal disease on peritoneal dialysis.  4. Diet-controlled diabetes mellitus.  5. History of cerebrovascular accident with mild left-sided weakness.  6. Hypertension.  7. Coronary artery disease, status post stent placement.  8. History of diastolic congestive heart failure.  9. Peripheral vascular disease status post left foot toe amputations.  10. Chronic obstructive pulmonary disease.  11. Anemia of chronic disease.  12. Gastroesophageal reflex disease.  13. Peripheral neuropathy.   DISCHARGE MEDICATIONS:  1. Loperamide 2 mg 1 capsule orally 4 times a day.  2. Aspirin 81 mg p.o. daily.  3. Hydroxyzine 25 mg p.o. daily.  4. Levothyroxine 25 mcg p.o. daily. 5. Plavix 75 mg p.o. daily. 6. Potassium chloride 20 mEq p.o. daily.  7. Ventolin inhaler 2 puffs every 4 to 6 hours p.r.n. for shortness of breath.  8. Lasix 80 mg p.o. daily p.r.n.  9. Mirtazapine 15 mg p.o. at bedtime.  10. Percocet 5/325 mg 1 tablet every 6 hours p.r.n. for pain.  11. Protonix 40 mg p.o. b.i.d.  12. Phenergan 25 mg every 8 hours as needed for nausea or vomiting.   DISCHARGE DIET: Renal diet.   DISCHARGE ACTIVITY: As tolerated.    FOLLOWUP INSTRUCTIONS: 1. GI follow up with Dr. Rayann Heman in 1 week.  2. Nephrology follow up in 1 to 2 weeks.  3. PCP follow up in 1 to 2 weeks.   LABORATORIES AND IMAGING STUDIES: Prior to discharge: WBC 17.4, hemoglobin 11.7, hemoglobin 11.3, hematocrit 35.0,  platelet count 282,000. Sodium 135, potassium 3.0, chloride 98, bicarbonate 21, BUN 35, creatinine 5.1, glucose 223 and calcium of 9.2. Magnesium 2.2. HbA1c is 8.0. Urinalysis negative for any infection. Blood cultures are negative. INR is 1.0. ALT 37, AST 45, alkaline phosphatase 233, total bilirubin 0.4 and albumin of 2.6.   BRIEF HOSPITAL COURSE: [name] is a 59 year old, African-American female with past medical history significant for end-stage renal disease on peritoneal dialysis, diabetes, hypertension, CVA and coronary artery disease, who presents to the hospital secondary to worsening nausea, vomiting and possible hematemesis.  1. Acute nausea and vomiting, likely secondary to acute infectious gastroenteritis, likely viral in nature. Her symptoms have resolved without any intervention here in the hospital. She was given supportive treatment. She tried chocolate pudding prior to discharge, which she tolerated well. I discussed about trying a solid diet, however, according to sister and also according to Dr. Candiss Norse, who knows the patient, the patient has a very poor appetite and has been on multiple appetite stimulant drugs in the past as an outpatient. She is being discharged on Phenergan p.r.n. for her nausea. She denies any abdominal pain. She was covered transiently with vancomycin and cefepime for possible peritonitis; however, she denied any abdominal pain. She has some mild diarrhea as an outpatient for which she takes Imodium p.r.n. White count was improving.  2. Questionable coffee-ground emesis. The patient had vomiting with brownish material in it, less likely to be coffee-ground emesis or old blood; however, hemoccult was not sent unfortunately. Her hemoglobin on admission  was 14, which was likely erroneous and also from hemoconcentration. Her baseline hemoglobin is around 11, which is what it was the next day after hemodilution. The repeat hemoglobin again was stable so she is being  discharged home. She was on Protonix once a day prior to admission and is being increased up to twice a day at this time.  3. Coronary artery disease history on aspirin and Plavix. Appears stable.  4. CVA with minimal left-sided weakness on aspirin and Plavix. Again appears stable at this time.  5. End-stage renal disease on peritoneal dialysis; follows with Dr. Candiss Norse as an outpatient. Continue Lasix as needed and also potassium supplements.  6. Peripheral neuropathy. Continue her home pain medications.  7. Her course has been otherwise uneventful in the hospital.   DISCHARGE CONDITION: Stable.   DISCHARGE DISPOSITION: Home.  TIME SPENT ON DISCHARGE: 40 minutes.     ____________________________ Gladstone Lighter, MD rk:TT D: 05/31/2014 14:01:20 ET T: 05/31/2014 17:47:08 ET JOB#: [id number]  cc: Gladstone Lighter, MD, <Dictator> Arther Dames, MD Murlean Iba, MD Danelle Berry. Derrek Monaco, MD Gladstone Lighter MD ELECTRONICALLY SIGNED 06/18/2014 14:48

## 2014-10-09 NOTE — Consult Note (Signed)
PATIENT NAME:  BRETTE, CAST MR#:  [id number] DATE OF BIRTH:  1955/06/28  DATE OF CONSULTATION:  05/30/2014  REFERRING PHYSICIAN:  Demetrios Loll, MD  CONSULTING PHYSICIAN:  Arther Dames, MD  REASON FOR THE CONSULT: Dark vomitus.   HISTORY OF PRESENT ILLNESS:  [name] is a chronically ill 59 year old female with a past medical history of end-stage renal disease on peritoneal dialysis, CVA, congestive heart failure, peripheral vascular disease, COPD who is presenting to the Emergency Room for evaluation of nausea and vomiting. Ms. Talford is quite a difficult historian, but from what I can gather, she did present to the Emergency Room for evaluation of nausea and vomiting; however, she does answer when asked, stated she has had some diarrhea over the past couple days. She also did have some issues with nausea and vomiting 1 to 2 days prior to presentation. She reports that her vomit this morning, she thinks was brown in color, right from the start. She has vomited about 3 or 4 times today. She also reports some loose stools, worse earlier in the week, but about 2 to 3 times per day.   PAST MEDICAL HISTORY:  1.  End-stage renal disease on peritoneal dialysis.  2.  DM.  3.  Hypertension.  4.  CVA.  5.  Coronary artery disease.  6.  CHF.  7.  Peripheral vascular disease.  8.  COPD.  9.  Anemia of chronic disease.  10.  GERD.   PAST SURGICAL HISTORY: Cholecystectomy.   SOCIAL HISTORY: She is an ongoing smoker. She denies alcohol.   FAMILY HISTORY: No family history of stomach cancer or colon cancer.   REVIEW OF SYSTEMS: A 10 system review was conducted, is negative except as stated in the HPI.   PHYSICAL EXAM:  VITAL SIGNS: Temperature is 97.5, pulse is 99, respirations are 18, blood pressure 133/80, pulse oximetry is 98% on room air.  GENERAL: Alert and oriented times 4.  No acute distress. Appears stated age. HEENT: Normocephalic/atraumatic. Extraocular movements are intact.  Anicteric. NECK: Soft, supple. JVP appears normal. No adenopathy. CHEST: Clear to auscultation. No wheeze or crackle. Respirations unlabored. HEART: Regular. No murmur, rub, or gallop.  Normal S1 and S2. ABDOMEN: Soft, mild diffuse tenderness to palpation, nondistended.  Normal active bowel sounds in all four quadrants.  No organomegaly. No masses EXTREMITIES: No swelling, well perfused. SKIN: No rash or lesion. Skin color, texture, turgor normal. NEUROLOGICAL: Grossly intact. PSYCHIATRIC: Normal tone and affect. MUSCULOSKELETAL: No joint swelling or erythema.   DATA: Her sodium 131, potassium 2.9, BUN 40, creatinine 6.25; her liver enzymes, alkaline phosphatase is 233, AST 45, ALT 37, white count is 20, hemoglobin 14, hematocrit 41, platelets are 350,000, INR is 1.0. She has not had any imaging.   ASSESSMENT AND PLAN: Problem:  Nausea, vomiting, diarrhea: This constellation of symptoms is most slightly suggestive of an acute infectious illness. She also has the elevated leukocytosis which would go along with this. I am not particularly concerned about the color of her vomit. I did see it myself and it is light brown in color, it is not suggestive of blood. Her hemoglobin is also stable.   RECOMMENDATIONS:  1.  Continue to follow hemoglobin.  2.  Perform FOBT on stool.  3.  It is reasonable to treat acute infectious illness with antibiotics as you are doing.  4.  No indication for EGD unless significant drop in hemoglobin or evidence of active bleeding.  5.  GI clinic follow-up.  Thank you for this consult.    ____________________________ Arther Dames, MD mr:nt D: 05/30/2014 18:29:39 ET T: 05/30/2014 23:04:56 ET JOB#: [id number]  cc: Arther Dames, MD, <Dictator> Mellody Life MD ELECTRONICALLY SIGNED 06/17/2014 14:18

## 2015-05-23 ENCOUNTER — Emergency Department: Payer: Medicare (Managed Care)

## 2015-05-23 ENCOUNTER — Inpatient Hospital Stay
Admission: EM | Admit: 2015-05-23 | Discharge: 2015-05-28 | DRG: 100 | Disposition: A | Discharge status: Home Health | Payer: Medicare (Managed Care) | Attending: Internal Medicine | Admitting: Internal Medicine

## 2015-05-23 DIAGNOSIS — A047 Enterocolitis due to Clostridium difficile: Secondary | ICD-10-CM | POA: Diagnosis present

## 2015-05-23 DIAGNOSIS — D649 Anemia, unspecified: Secondary | ICD-10-CM | POA: Diagnosis present

## 2015-05-23 DIAGNOSIS — E119 Type 2 diabetes mellitus without complications: Secondary | ICD-10-CM | POA: Diagnosis present

## 2015-05-23 DIAGNOSIS — N186 End stage renal disease: Secondary | ICD-10-CM | POA: Diagnosis present

## 2015-05-23 DIAGNOSIS — Z794 Long term (current) use of insulin: Secondary | ICD-10-CM

## 2015-05-23 DIAGNOSIS — N19 Unspecified kidney failure: Secondary | ICD-10-CM

## 2015-05-23 DIAGNOSIS — Z7902 Long term (current) use of antithrombotics/antiplatelets: Secondary | ICD-10-CM

## 2015-05-23 DIAGNOSIS — N390 Urinary tract infection, site not specified: Secondary | ICD-10-CM

## 2015-05-23 DIAGNOSIS — G40901 Epilepsy, unspecified, not intractable, with status epilepticus: Secondary | ICD-10-CM | POA: Diagnosis not present

## 2015-05-23 DIAGNOSIS — Z6821 Body mass index (BMI) 21.0-21.9, adult: Secondary | ICD-10-CM

## 2015-05-23 DIAGNOSIS — I132 Hypertensive heart and chronic kidney disease with heart failure and with stage 5 chronic kidney disease, or end stage renal disease: Secondary | ICD-10-CM | POA: Diagnosis present

## 2015-05-23 DIAGNOSIS — I69354 Hemiplegia and hemiparesis following cerebral infarction affecting left non-dominant side: Secondary | ICD-10-CM

## 2015-05-23 DIAGNOSIS — L8990 Pressure ulcer of unspecified site, unspecified stage: Secondary | ICD-10-CM | POA: Insufficient documentation

## 2015-05-23 DIAGNOSIS — I509 Heart failure, unspecified: Secondary | ICD-10-CM | POA: Diagnosis present

## 2015-05-23 DIAGNOSIS — E785 Hyperlipidemia, unspecified: Secondary | ICD-10-CM | POA: Diagnosis present

## 2015-05-23 DIAGNOSIS — Z7982 Long term (current) use of aspirin: Secondary | ICD-10-CM

## 2015-05-23 DIAGNOSIS — E46 Unspecified protein-calorie malnutrition: Secondary | ICD-10-CM | POA: Diagnosis present

## 2015-05-23 DIAGNOSIS — G40909 Epilepsy, unspecified, not intractable, without status epilepticus: Secondary | ICD-10-CM | POA: Diagnosis not present

## 2015-05-23 DIAGNOSIS — E039 Hypothyroidism, unspecified: Secondary | ICD-10-CM | POA: Diagnosis present

## 2015-05-23 DIAGNOSIS — Z886 Allergy status to analgesic agent status: Secondary | ICD-10-CM

## 2015-05-23 DIAGNOSIS — F17210 Nicotine dependence, cigarettes, uncomplicated: Secondary | ICD-10-CM | POA: Diagnosis present

## 2015-05-23 DIAGNOSIS — Z992 Dependence on renal dialysis: Secondary | ICD-10-CM

## 2015-05-23 DIAGNOSIS — Z79899 Other long term (current) drug therapy: Secondary | ICD-10-CM

## 2015-05-23 DIAGNOSIS — I639 Cerebral infarction, unspecified: Secondary | ICD-10-CM

## 2015-05-23 DIAGNOSIS — N2581 Secondary hyperparathyroidism of renal origin: Secondary | ICD-10-CM | POA: Diagnosis present

## 2015-05-23 DIAGNOSIS — J189 Pneumonia, unspecified organism: Secondary | ICD-10-CM | POA: Diagnosis present

## 2015-05-23 DIAGNOSIS — R569 Unspecified convulsions: Secondary | ICD-10-CM

## 2015-05-23 DIAGNOSIS — L89151 Pressure ulcer of sacral region, stage 1: Secondary | ICD-10-CM | POA: Diagnosis present

## 2015-05-23 HISTORY — DX: End stage renal disease: N18.6

## 2015-05-23 HISTORY — DX: Hemiplegia and hemiparesis following cerebral infarction affecting unspecified side: I69.359

## 2015-05-23 HISTORY — DX: Dependence on renal dialysis: Z99.2

## 2015-05-23 HISTORY — DX: Type 2 diabetes mellitus without complications: E11.9

## 2015-05-23 HISTORY — DX: Heart failure, unspecified: I50.9

## 2015-05-23 HISTORY — DX: Essential (primary) hypertension: I10

## 2015-05-23 HISTORY — DX: Hypothyroidism, unspecified: E03.9

## 2015-05-23 LAB — CBC WITH DIFFERENTIAL/PLATELET
BASOS ABS: 0.2 10*3/uL — AB (ref 0–0.1)
Basophils Relative: 1 %
EOS ABS: 0.2 10*3/uL (ref 0–0.7)
Eosinophils Relative: 1 %
HCT: 37.6 % (ref 35.0–47.0)
HEMOGLOBIN: 12.1 g/dL (ref 12.0–16.0)
LYMPHS ABS: 3 10*3/uL (ref 1.0–3.6)
Lymphocytes Relative: 15 %
MCH: 30.5 pg (ref 26.0–34.0)
MCHC: 32.3 g/dL (ref 32.0–36.0)
MCV: 94.5 fL (ref 80.0–100.0)
Monocytes Absolute: 0.9 10*3/uL (ref 0.2–0.9)
Monocytes Relative: 4 %
NEUTROS PCT: 79 %
Neutro Abs: 16.2 10*3/uL — ABNORMAL HIGH (ref 1.4–6.5)
PLATELETS: 401 10*3/uL (ref 150–440)
RBC: 3.98 MIL/uL (ref 3.80–5.20)
RDW: 13.9 % (ref 11.5–14.5)
WBC: 20.5 10*3/uL — AB (ref 3.6–11.0)

## 2015-05-23 LAB — GLUCOSE, CAPILLARY: GLUCOSE-CAPILLARY: 209 mg/dL — AB (ref 65–99)

## 2015-05-23 NOTE — ED Notes (Signed)
Patient transported to CT 

## 2015-05-23 NOTE — ED Notes (Signed)
Patient presents to Emergency Department via EMS with complaints of seizures witnessed by EMS.  Pt rx'd '2mg'$  versed and NRB 15lpm by EMS.

## 2015-05-23 NOTE — ED Notes (Signed)
Pt to room

## 2015-05-23 NOTE — ED Notes (Signed)
Xray bedside.

## 2015-05-23 NOTE — ED Provider Notes (Signed)
[facility] Emergency Department Provider Note  ____________________________________________  Time seen: Approximately 11:26 PM  I have reviewed the triage vital signs and the nursing notes.   HISTORY  Chief Complaint Seizures  Limited by patient unresponsive  HPI Sherri Davidson is a 59 y.o. female brought to the ED from home via EMS for seizure-like activity. Patient does not have a past medical history of seizure disorder. She does have a history significant for renal failure on peritoneal dialysis and prior CVA with cerebral hemorrhage. She was found by family to be having a seizure. EMS witness tonic-clonic activity lasting approximately 7 minutes which was resolved after administration of 2 mg IV Versed. Rest of history is limited secondary to patient's unresponsive state.   Past medical history Chickenpox    Hypertension    Hyperlipidemia    Renal failure  on dialysis  Stroke  x2 with cerebral hemorrhage  Congestive heart failure    Blockage of coronary artery of heart      Patient Active Problem List   Diagnosis Date Noted  . Seizure ([place]) 05/24/2015  . Community acquired pneumonia 05/24/2015    Past surgical history Cholecystectomy    CARDIAC CATH WITH STENT 2010   Tonsillectomy      Current Outpatient Rx  Name  Route  Sig  Dispense  Refill  . aspirin EC 81 MG tablet   Oral   Take 81 mg by mouth daily.         . ciprofloxacin (CIPRO) 500 MG tablet   Oral   Take 500 mg by mouth See admin instructions. Take 1 tablet by mouth once a day after dialysis in case of peritonitis         . citalopram (CELEXA) 10 MG tablet   Oral   Take 10 mg by mouth daily.         . clopidogrel (PLAVIX) 75 MG tablet   Oral   Take 75 mg by mouth daily.         . hydrOXYzine (VISTARIL) 25 MG capsule   Oral   Take 25 mg by mouth daily as needed for itching.         . levothyroxine (SYNTHROID, LEVOTHROID) 25 MCG tablet   Oral  Take 25 mcg by mouth daily before breakfast.         . mirtazapine (REMERON) 15 MG tablet   Oral   Take 15 mg by mouth at bedtime.         . pantoprazole (PROTONIX) 40 MG tablet   Oral   Take 40 mg by mouth daily.         . potassium chloride SA (K-DUR,KLOR-CON) 20 MEQ tablet   Oral   Take 20 mEq by mouth at bedtime.         . simvastatin (ZOCOR) 20 MG tablet   Oral   Take 20 mg by mouth daily.         . Vitamin D, Ergocalciferol, (DRISDOL) 50000 UNITS CAPS capsule   Oral   Take 50,000 Units by mouth every 30 (thirty) days.           Allergies Asa  History reviewed. No pertinent family history.  Social History Social History  Substance Use Topics  . Smoking status: Current Every Day Smoker -- 1.00 packs/day for 30 years    Types: Cigarettes  . Smokeless tobacco: None  . Alcohol Use: No    Review of Systems Constitutional: No fever/chills Eyes: No visual  changes. ENT: No sore throat. Cardiovascular: Denies chest pain. Respiratory: Denies shortness of breath. Gastrointestinal: No abdominal pain.  No nausea, no vomiting.  No diarrhea.  No constipation. Genitourinary: Negative for dysuria. Musculoskeletal: Negative for back pain. Skin: Negative for rash. Neurological: Positive for new onset seizure. Negative for headaches, focal weakness or numbness.  Limited by patient unresponsive; 10-point ROS otherwise negative.  ____________________________________________   PHYSICAL EXAM:  VITAL SIGNS: ED Triage Vitals  Enc Vitals Group     BP --      Pulse --      Resp --      Temp --      Temp src --      SpO2 --      Weight --      Height --      Head Cir --      Peak Flow --      Pain Score --      Pain Loc --      Pain Edu? --      Excl. in [place]? --     Constitutional: Unresponsive; Sectral; starting to move around. Eyes: Conjunctivae are normal. PERRL. EOMI. Head: Atraumatic. Nose: No congestion/rhinnorhea. Mouth/Throat: Mucous  membranes are moist.  Oropharynx non-erythematous.  Edentulous. Neck: No stridor.  No carotid bruits. No step-offs or deformities noted. Cardiovascular: Tachycardic rate, regular rhythm. Grossly normal heart sounds.  Good peripheral circulation. Respiratory: Increased respiratory effort.  No retractions. Lungs scattered rhonchi. Gastrointestinal: Soft and nontender. No distention. No abdominal bruits. No CVA tenderness. Her toenail dialysis catheter in place. Musculoskeletal: No lower extremity edema.  No joint effusions. Neurologic:  Opens eyes to painful stimuli. Starting to move right arm above head. Skin:  Skin is warm, dry and intact. No rash noted. Specifically, no petechiae. Psychiatric: Mood and affect are normal.  ____________________________________________   LABS (all labs ordered are listed, but only abnormal results are displayed)  Labs Reviewed  CBC WITH DIFFERENTIAL/PLATELET - Abnormal; Notable for the following:    WBC 20.5 (*)    Neutro Abs 16.2 (*)    Basophils Absolute 0.2 (*)    All other components within normal limits  COMPREHENSIVE METABOLIC PANEL - Abnormal; Notable for the following:    CO2 15 (*)    Glucose, Bld 214 (*)    BUN 31 (*)    Creatinine, Ser 6.43 (*)    Calcium 8.8 (*)    Albumin 2.4 (*)    GFR calc non Af Amer 6 (*)    GFR calc Af Amer 7 (*)    Anion gap 16 (*)    All other components within normal limits  LACTIC ACID, PLASMA - Abnormal; Notable for the following:    Lactic Acid, Venous 5.9 (*)    All other components within normal limits  URINALYSIS COMPLETEWITH MICROSCOPIC ([facility] ONLY) - Abnormal; Notable for the following:    Color, Urine YELLOW (*)    APPearance HAZY (*)    Hgb urine dipstick 1+ (*)    Protein, ur 100 (*)    Leukocytes, UA 2+ (*)    Bacteria, UA RARE (*)    Squamous Epithelial / LPF 0-5 (*)    All other components within normal limits  BLOOD GAS, ARTERIAL - Abnormal; Notable for the following:    pH, Arterial 7.27  (*)    Bicarbonate 16.5 (*)    Acid-base deficit 9.6 (*)    All other components within normal limits  GLUCOSE, CAPILLARY - Abnormal; Notable  for the following:    Glucose-Capillary 209 (*)    All other components within normal limits  CULTURE, BLOOD (ROUTINE X 2)  CULTURE, BLOOD (ROUTINE X 2)  URINE CULTURE  ETHANOL  TROPONIN I  PROTIME-INR  LACTIC ACID, PLASMA  CBC WITH DIFFERENTIAL/PLATELET  BASIC METABOLIC PANEL  TYPE AND SCREEN  ABO/RH   ____________________________________________  EKG  ED ECG REPORT I, SUNG,JADE J, the attending physician, personally viewed and interpreted this ECG.   Date: 05/24/2015  EKG Time: 2329  Rate: 120  Rhythm: sinus tachycardia  Axis: Normal  Intervals:none  ST&T Change: Nonspecific  ____________________________________________  RADIOLOGY  CT Head w/o Contrast interpreted per Dr. Radene Knee: 1. No acute intracranial pathology seen on CT. 2. Mild cortical volume loss and scattered small vessel ischemic microangiopathy. 3. Chronic infarct at the high right parietal lobe, with associated encephalomalacia and mild ex vacuo dilatation of the right lateral Ventricle.  Portable chest x-ray (viewed by me, interpreted per Dr. Radene Knee): Small right pleural effusion noted. Right basilar airspace opacity seen. This raises concern for pneumonia, depending on the patient's symptoms. If symptoms are compatible with pneumonia, follow-up PA and lateral chest X-ray is recommended in 3-4 weeks following trial of antibiotic therapy to ensure resolution and exclude underlying malignancy. ____________________________________________   PROCEDURES  Procedure(s) performed: None  Critical Care performed: Yes, see critical care note(s)  CRITICAL CARE Performed by: Paulette Blanch   Total critical care time: 30 minutes  Critical care time was exclusive of separately billable procedures and treating other patients.  Critical care was necessary to treat  or prevent imminent or life-threatening deterioration.  Critical care was time spent personally by me on the following activities: development of treatment plan with patient and/or surrogate as well as nursing, discussions with consultants, evaluation of patient's response to treatment, examination of patient, obtaining history from patient or surrogate, ordering and performing treatments and interventions, ordering and review of laboratory studies, ordering and review of radiographic studies, pulse oximetry and re-evaluation of patient's condition. ____________________________________________   INITIAL IMPRESSION / ASSESSMENT AND PLAN / ED COURSE  Pertinent labs & imaging results that were available during my care of the patient were reviewed by me and considered in my medical decision making (see chart for details).  59 year old female with a history of renal failure on peritoneal dialysis, prior hemorrhagic stroke, CAD and CHF who presents with new onset seizure. Patient taken emergently for CT head. Will obtain screening lab work including lactic acid, chest x-ray, urinalysis. Currently patient appears to be post ictal starting to move her extremities. Anticipate hospital admission.  ----------------------------------------- 12:05 AM on 05/24/2015 -----------------------------------------  Patient now awake and alert. Nonverbal but following commands. Left-sided hemiplegia noted. Awaiting family.  ----------------------------------------- 12:27 AM on 05/24/2015 -----------------------------------------  Patient's son and sister at bedside who is patient's power of attorney. Sister states patient was last seen in her usual state of health approximately 10:40 PM. At 44 PM she was found to be stiff with seizure-like activity and "looked gray in the face". Sister states patient's baseline is mild residual left-sided deficits from prior CVA. Patient's baseline is conversive and ambulatory.  Sister states patient's current nonverbal status and left-sided hemiplegia is new. States with her prior CVA, the patient had left-sided hemiplegia but did not lose her speech. I discussed at length with patient's family the possibility of CVA versus seizure versus sepsis. Do not feel patient would be a good candidate for TPA given her prior history of hemorrhagic stroke and especially in  the setting of acute seizure, feel risks outweigh the benefits of TPA. Family agrees. Unclear if elevated lactate is secondary to sepsis or seizure activity. As patient is not febrile nor hypotensive, we will not initiate ED sepsis protocol. Will continue IV fluid resuscitation with the first liter in the emergency department. ____________________________________________   FINAL CLINICAL IMPRESSION(S) / ED DIAGNOSES  Final diagnoses:  New onset seizure ([place])  Community acquired pneumonia  Renal failure  Peritoneal dialysis status ([place])  CVA (cerebral infarction)  UTI (lower urinary tract infection)      Paulette Blanch, MD 05/24/15 0230

## 2015-05-23 NOTE — ED Notes (Signed)
Resp at bedside

## 2015-05-24 ENCOUNTER — Inpatient Hospital Stay: Payer: Medicare (Managed Care)

## 2015-05-24 ENCOUNTER — Encounter: Payer: Self-pay | Admitting: Internal Medicine

## 2015-05-24 DIAGNOSIS — L89151 Pressure ulcer of sacral region, stage 1: Secondary | ICD-10-CM | POA: Diagnosis present

## 2015-05-24 DIAGNOSIS — Z992 Dependence on renal dialysis: Secondary | ICD-10-CM | POA: Diagnosis not present

## 2015-05-24 DIAGNOSIS — E039 Hypothyroidism, unspecified: Secondary | ICD-10-CM | POA: Diagnosis present

## 2015-05-24 DIAGNOSIS — I69354 Hemiplegia and hemiparesis following cerebral infarction affecting left non-dominant side: Secondary | ICD-10-CM | POA: Diagnosis not present

## 2015-05-24 DIAGNOSIS — E46 Unspecified protein-calorie malnutrition: Secondary | ICD-10-CM | POA: Diagnosis present

## 2015-05-24 DIAGNOSIS — Z7902 Long term (current) use of antithrombotics/antiplatelets: Secondary | ICD-10-CM | POA: Diagnosis not present

## 2015-05-24 DIAGNOSIS — Z79899 Other long term (current) drug therapy: Secondary | ICD-10-CM | POA: Diagnosis not present

## 2015-05-24 DIAGNOSIS — J189 Pneumonia, unspecified organism: Secondary | ICD-10-CM | POA: Diagnosis present

## 2015-05-24 DIAGNOSIS — G40901 Epilepsy, unspecified, not intractable, with status epilepticus: Secondary | ICD-10-CM | POA: Diagnosis present

## 2015-05-24 DIAGNOSIS — Z794 Long term (current) use of insulin: Secondary | ICD-10-CM | POA: Diagnosis not present

## 2015-05-24 DIAGNOSIS — G40909 Epilepsy, unspecified, not intractable, without status epilepticus: Secondary | ICD-10-CM | POA: Diagnosis present

## 2015-05-24 DIAGNOSIS — I509 Heart failure, unspecified: Secondary | ICD-10-CM | POA: Diagnosis present

## 2015-05-24 DIAGNOSIS — E119 Type 2 diabetes mellitus without complications: Secondary | ICD-10-CM | POA: Diagnosis present

## 2015-05-24 DIAGNOSIS — R569 Unspecified convulsions: Secondary | ICD-10-CM

## 2015-05-24 DIAGNOSIS — N390 Urinary tract infection, site not specified: Secondary | ICD-10-CM | POA: Diagnosis present

## 2015-05-24 DIAGNOSIS — F17210 Nicotine dependence, cigarettes, uncomplicated: Secondary | ICD-10-CM | POA: Diagnosis present

## 2015-05-24 DIAGNOSIS — N2581 Secondary hyperparathyroidism of renal origin: Secondary | ICD-10-CM | POA: Diagnosis present

## 2015-05-24 DIAGNOSIS — Z6821 Body mass index (BMI) 21.0-21.9, adult: Secondary | ICD-10-CM | POA: Diagnosis not present

## 2015-05-24 DIAGNOSIS — Z7982 Long term (current) use of aspirin: Secondary | ICD-10-CM | POA: Diagnosis not present

## 2015-05-24 DIAGNOSIS — E785 Hyperlipidemia, unspecified: Secondary | ICD-10-CM | POA: Diagnosis present

## 2015-05-24 DIAGNOSIS — A047 Enterocolitis due to Clostridium difficile: Secondary | ICD-10-CM | POA: Diagnosis present

## 2015-05-24 DIAGNOSIS — D649 Anemia, unspecified: Secondary | ICD-10-CM | POA: Diagnosis present

## 2015-05-24 DIAGNOSIS — Z886 Allergy status to analgesic agent status: Secondary | ICD-10-CM | POA: Diagnosis not present

## 2015-05-24 DIAGNOSIS — I132 Hypertensive heart and chronic kidney disease with heart failure and with stage 5 chronic kidney disease, or end stage renal disease: Secondary | ICD-10-CM | POA: Diagnosis present

## 2015-05-24 DIAGNOSIS — L8990 Pressure ulcer of unspecified site, unspecified stage: Secondary | ICD-10-CM | POA: Insufficient documentation

## 2015-05-24 DIAGNOSIS — N186 End stage renal disease: Secondary | ICD-10-CM | POA: Diagnosis present

## 2015-05-24 LAB — CBC WITH DIFFERENTIAL/PLATELET
Basophils Absolute: 0 10*3/uL (ref 0–0.1)
Basophils Relative: 0 %
Eosinophils Absolute: 0 10*3/uL (ref 0–0.7)
Eosinophils Relative: 0 %
HEMATOCRIT: 35.4 % (ref 35.0–47.0)
HEMOGLOBIN: 11.6 g/dL — AB (ref 12.0–16.0)
LYMPHS ABS: 1.1 10*3/uL (ref 1.0–3.6)
Lymphocytes Relative: 7 %
MCH: 30.6 pg (ref 26.0–34.0)
MCHC: 32.8 g/dL (ref 32.0–36.0)
MCV: 93.4 fL (ref 80.0–100.0)
MONO ABS: 0.6 10*3/uL (ref 0.2–0.9)
MONOS PCT: 4 %
NEUTROS ABS: 14.2 10*3/uL — AB (ref 1.4–6.5)
NEUTROS PCT: 89 %
Platelets: 276 10*3/uL (ref 150–440)
RBC: 3.79 MIL/uL — ABNORMAL LOW (ref 3.80–5.20)
RDW: 13.9 % (ref 11.5–14.5)
WBC: 16 10*3/uL — ABNORMAL HIGH (ref 3.6–11.0)

## 2015-05-24 LAB — COMPREHENSIVE METABOLIC PANEL
ALT: 17 U/L (ref 14–54)
AST: 39 U/L (ref 15–41)
Albumin: 2.4 g/dL — ABNORMAL LOW (ref 3.5–5.0)
Alkaline Phosphatase: 124 U/L (ref 38–126)
Anion gap: 16 — ABNORMAL HIGH (ref 5–15)
BILIRUBIN TOTAL: 0.3 mg/dL (ref 0.3–1.2)
BUN: 31 mg/dL — AB (ref 6–20)
CO2: 15 mmol/L — ABNORMAL LOW (ref 22–32)
CREATININE: 6.43 mg/dL — AB (ref 0.44–1.00)
Calcium: 8.8 mg/dL — ABNORMAL LOW (ref 8.9–10.3)
Chloride: 107 mmol/L (ref 101–111)
GFR, EST AFRICAN AMERICAN: 7 mL/min — AB (ref >60)
GFR, EST NON AFRICAN AMERICAN: 6 mL/min — AB (ref >60)
Glucose, Bld: 214 mg/dL — ABNORMAL HIGH (ref 65–99)
POTASSIUM: 4.1 mmol/L (ref 3.5–5.1)
Sodium: 138 mmol/L (ref 135–145)
TOTAL PROTEIN: 7.3 g/dL (ref 6.5–8.1)

## 2015-05-24 LAB — LACTIC ACID, PLASMA
LACTIC ACID, VENOUS: 2.9 mmol/L — AB (ref 0.5–2.0)
Lactic Acid, Venous: 4 mmol/L (ref 0.5–2.0)
Lactic Acid, Venous: 5.9 mmol/L (ref 0.5–2.0)

## 2015-05-24 LAB — BLOOD GAS, ARTERIAL
ACID-BASE DEFICIT: 9.6 mmol/L — AB (ref 0.0–2.0)
Bicarbonate: 16.5 mEq/L — ABNORMAL LOW (ref 21.0–28.0)
FIO2: 0.28
O2 SAT: 96.6 %
PATIENT TEMPERATURE: 37
PCO2 ART: 36 mmHg (ref 32.0–48.0)
pH, Arterial: 7.27 — ABNORMAL LOW (ref 7.350–7.450)
pO2, Arterial: 98 mmHg (ref 83.0–108.0)

## 2015-05-24 LAB — BASIC METABOLIC PANEL
Anion gap: 11 (ref 5–15)
BUN: 30 mg/dL — AB (ref 6–20)
CALCIUM: 8.5 mg/dL — AB (ref 8.9–10.3)
CHLORIDE: 106 mmol/L (ref 101–111)
CO2: 20 mmol/L — AB (ref 22–32)
CREATININE: 6.18 mg/dL — AB (ref 0.44–1.00)
GFR calc Af Amer: 8 mL/min — ABNORMAL LOW (ref >60)
GFR calc non Af Amer: 7 mL/min — ABNORMAL LOW (ref >60)
GLUCOSE: 284 mg/dL — AB (ref 65–99)
Potassium: 4.1 mmol/L (ref 3.5–5.1)
Sodium: 137 mmol/L (ref 135–145)

## 2015-05-24 LAB — GLUCOSE, CAPILLARY
GLUCOSE-CAPILLARY: 230 mg/dL — AB (ref 65–99)
GLUCOSE-CAPILLARY: 149 mg/dL — AB (ref 65–99)
Glucose-Capillary: 337 mg/dL — ABNORMAL HIGH (ref 65–99)
Glucose-Capillary: 109 mg/dL — ABNORMAL HIGH (ref 65–99)
Glucose-Capillary: 125 mg/dL — ABNORMAL HIGH (ref 65–99)

## 2015-05-24 LAB — ABO/RH: ABO/RH(D): O NEG

## 2015-05-24 LAB — URINALYSIS COMPLETE WITH MICROSCOPIC (ARMC ONLY)
BILIRUBIN URINE: NEGATIVE
GLUCOSE, UA: NEGATIVE mg/dL
KETONES UR: NEGATIVE mg/dL
NITRITE: NEGATIVE
PROTEIN: 100 mg/dL — AB
SPECIFIC GRAVITY, URINE: 1.011 (ref 1.005–1.030)
pH: 7 (ref 5.0–8.0)

## 2015-05-24 LAB — C DIFFICILE QUICK SCREEN W PCR REFLEX
C DIFFICLE (CDIFF) ANTIGEN: POSITIVE — AB
C Diff toxin: NEGATIVE

## 2015-05-24 LAB — PROTIME-INR
INR: 1.1
PROTHROMBIN TIME: 14.4 s (ref 11.4–15.0)

## 2015-05-24 LAB — TROPONIN I

## 2015-05-24 LAB — ETHANOL

## 2015-05-24 LAB — TYPE AND SCREEN
ABO/RH(D): O NEG
Antibody Screen: NEGATIVE

## 2015-05-24 LAB — CLOSTRIDIUM DIFFICILE BY PCR: Toxigenic C. Difficile by PCR: POSITIVE — AB

## 2015-05-24 MED ORDER — VANCOMYCIN HCL IN DEXTROSE 750-5 MG/150ML-% IV SOLN
750.0000 mg | Freq: Once | INTRAVENOUS | Status: DC
  Filled 2015-05-24: qty 150

## 2015-05-24 MED ORDER — PHENYTOIN SODIUM EXTENDED 100 MG PO CAPS
300.0000 mg | ORAL_CAPSULE | Freq: Every day | ORAL | Status: DC
  Administered 2015-05-24 – 2015-05-27 (×4): 300 mg via ORAL
  Filled 2015-05-24 (×5): qty 3

## 2015-05-24 MED ORDER — VANCOMYCIN HCL IN DEXTROSE 1-5 GM/200ML-% IV SOLN: 1000.0000 mg | INTRAVENOUS | Status: DC

## 2015-05-24 MED ORDER — ONDANSETRON HCL 4 MG/2ML IJ SOLN
4.0000 mg | Freq: Once | INTRAMUSCULAR | Status: AC
  Administered 2015-05-24: 4 mg via INTRAVENOUS

## 2015-05-24 MED ORDER — PIPERACILLIN-TAZOBACTAM 3.375 G IVPB
3.3750 g | Freq: Two times a day (BID) | INTRAVENOUS | Status: DC
  Administered 2015-05-24 – 2015-05-25 (×2): 3.375 g via INTRAVENOUS
  Filled 2015-05-24 (×4): qty 50

## 2015-05-24 MED ORDER — VANCOMYCIN HCL 500 MG IV SOLR
500.0000 mg | Freq: Once | INTRAVENOUS | Status: AC
  Administered 2015-05-24: 500 mg via INTRAVENOUS
  Filled 2015-05-24: qty 500

## 2015-05-24 MED ORDER — SODIUM CHLORIDE 0.9 % IV BOLUS (SEPSIS)
1000.0000 mL | Freq: Once | INTRAVENOUS | Status: AC
  Administered 2015-05-24: 1000 mL via INTRAVENOUS

## 2015-05-24 MED ORDER — HYDRALAZINE HCL 20 MG/ML IJ SOLN
10.0000 mg | Freq: Four times a day (QID) | INTRAMUSCULAR | Status: DC | PRN
  Administered 2015-05-24: 10 mg via INTRAVENOUS
  Filled 2015-05-24: qty 1

## 2015-05-24 MED ORDER — DELFLEX-LC/1.5% DEXTROSE 346 MOSM/L IP SOLN
INTRAPERITONEAL | Status: DC
  Administered 2015-05-24 – 2015-05-27 (×3): via INTRAPERITONEAL
  Filled 2015-05-24: qty 3000

## 2015-05-24 MED ORDER — HEPARIN SODIUM (PORCINE) 5000 UNIT/ML IJ SOLN
5000.0000 [IU] | Freq: Three times a day (TID) | INTRAMUSCULAR | Status: DC
  Administered 2015-05-24 – 2015-05-28 (×10): 5000 [IU] via SUBCUTANEOUS
  Filled 2015-05-24 (×10): qty 1

## 2015-05-24 MED ORDER — VANCOMYCIN HCL IN DEXTROSE 1-5 GM/200ML-% IV SOLN
1000.0000 mg | Freq: Once | INTRAVENOUS | Status: AC
  Administered 2015-05-24: 1000 mg via INTRAVENOUS
  Filled 2015-05-24: qty 200

## 2015-05-24 MED ORDER — MIRTAZAPINE 15 MG PO TABS
15.0000 mg | ORAL_TABLET | Freq: Every day | ORAL | Status: DC
  Administered 2015-05-24 – 2015-05-27 (×4): 15 mg via ORAL
  Filled 2015-05-24 (×4): qty 1

## 2015-05-24 MED ORDER — INSULIN ASPART 100 UNIT/ML ~~LOC~~ SOLN
0.0000 [IU] | Freq: Three times a day (TID) | SUBCUTANEOUS | Status: DC
  Administered 2015-05-24: 3 [IU] via SUBCUTANEOUS
  Filled 2015-05-24: qty 3

## 2015-05-24 MED ORDER — METRONIDAZOLE 500 MG PO TABS
500.0000 mg | ORAL_TABLET | Freq: Three times a day (TID) | ORAL | Status: DC
  Administered 2015-05-24 – 2015-05-26 (×8): 500 mg via ORAL
  Filled 2015-05-24 (×8): qty 1

## 2015-05-24 MED ORDER — PIPERACILLIN-TAZOBACTAM 3.375 G IVPB
3.3750 g | Freq: Once | INTRAVENOUS | Status: AC
  Administered 2015-05-24: 3.375 g via INTRAVENOUS
  Filled 2015-05-24: qty 50

## 2015-05-24 MED ORDER — ACETAMINOPHEN 325 MG PO TABS
650.0000 mg | ORAL_TABLET | Freq: Four times a day (QID) | ORAL | Status: DC | PRN
  Administered 2015-05-24 – 2015-05-25 (×2): 650 mg via ORAL
  Filled 2015-05-24 (×2): qty 2

## 2015-05-24 MED ORDER — CITALOPRAM HYDROBROMIDE 20 MG PO TABS
10.0000 mg | ORAL_TABLET | Freq: Every day | ORAL | Status: DC
  Administered 2015-05-24 – 2015-05-28 (×5): 10 mg via ORAL
  Filled 2015-05-24 (×5): qty 1

## 2015-05-24 MED ORDER — LEVOTHYROXINE SODIUM 50 MCG PO TABS
25.0000 ug | ORAL_TABLET | Freq: Every day | ORAL | Status: DC
  Administered 2015-05-24 – 2015-05-28 (×5): 25 ug via ORAL
  Filled 2015-05-24 (×5): qty 1

## 2015-05-24 MED ORDER — LORAZEPAM 2 MG/ML IJ SOLN: 2.0000 mg | INTRAMUSCULAR | Status: DC | PRN

## 2015-05-24 MED ORDER — SODIUM CHLORIDE 0.9 % IV SOLN
900.0000 mg | Freq: Once | INTRAVENOUS | Status: AC
  Administered 2015-05-24: 900 mg via INTRAVENOUS
  Filled 2015-05-24: qty 18

## 2015-05-24 MED ORDER — SIMVASTATIN 20 MG PO TABS
20.0000 mg | ORAL_TABLET | Freq: Every day | ORAL | Status: DC
  Administered 2015-05-24 – 2015-05-28 (×5): 20 mg via ORAL
  Filled 2015-05-24 (×5): qty 1

## 2015-05-24 MED ORDER — SODIUM CHLORIDE 0.9 % IV SOLN
INTRAVENOUS | Status: DC
  Administered 2015-05-24: 04:00:00 via INTRAVENOUS

## 2015-05-24 MED ORDER — ASPIRIN EC 81 MG PO TBEC
81.0000 mg | DELAYED_RELEASE_TABLET | Freq: Every day | ORAL | Status: DC
  Administered 2015-05-24 – 2015-05-28 (×5): 81 mg via ORAL
  Filled 2015-05-24 (×5): qty 1

## 2015-05-24 MED ORDER — ONDANSETRON HCL 4 MG/2ML IJ SOLN
4.0000 mg | Freq: Four times a day (QID) | INTRAMUSCULAR | Status: DC | PRN
  Administered 2015-05-24 – 2015-05-28 (×8): 4 mg via INTRAVENOUS
  Filled 2015-05-24 (×9): qty 2

## 2015-05-24 MED ORDER — CLOPIDOGREL BISULFATE 75 MG PO TABS
75.0000 mg | ORAL_TABLET | Freq: Every day | ORAL | Status: DC
  Administered 2015-05-24 – 2015-05-28 (×5): 75 mg via ORAL
  Filled 2015-05-24 (×5): qty 1

## 2015-05-24 MED ORDER — INSULIN ASPART 100 UNIT/ML ~~LOC~~ SOLN
0.0000 [IU] | Freq: Every day | SUBCUTANEOUS | Status: DC
  Administered 2015-05-24: 4 [IU] via SUBCUTANEOUS
  Filled 2015-05-24: qty 4

## 2015-05-24 MED ORDER — SODIUM CHLORIDE 0.9 % IV SOLN
15.0000 mg/kg | Freq: Once | INTRAVENOUS | Status: DC
  Filled 2015-05-24: qty 17.76

## 2015-05-24 MED ORDER — VANCOMYCIN HCL IN DEXTROSE 1-5 GM/200ML-% IV SOLN
1000.0000 mg | INTRAVENOUS | Status: DC | PRN
  Filled 2015-05-24: qty 200

## 2015-05-24 MED ORDER — VITAMIN D (ERGOCALCIFEROL) 1.25 MG (50000 UNIT) PO CAPS
50000.0000 [IU] | ORAL_CAPSULE | ORAL | Status: DC
  Administered 2015-05-26: 50000 [IU] via ORAL
  Filled 2015-05-24: qty 1

## 2015-05-24 MED ORDER — PANTOPRAZOLE SODIUM 40 MG PO TBEC
40.0000 mg | DELAYED_RELEASE_TABLET | Freq: Every day | ORAL | Status: DC
  Administered 2015-05-24 – 2015-05-28 (×5): 40 mg via ORAL
  Filled 2015-05-24 (×5): qty 1

## 2015-05-24 MED ORDER — METOPROLOL TARTRATE 25 MG PO TABS
12.5000 mg | ORAL_TABLET | Freq: Two times a day (BID) | ORAL | Status: DC
  Administered 2015-05-24 – 2015-05-26 (×5): 12.5 mg via ORAL
  Filled 2015-05-24 (×5): qty 1

## 2015-05-24 NOTE — Progress Notes (Signed)
Sister [name] was called to get telephone consent for PD. Sister was updated on plan of care. No further orders.

## 2015-05-24 NOTE — H&P (Addendum)
[place] [facility] [place] NAME: Sherri Davidson    MR#:  [id number]  DATE OF BIRTH:  1955/08/07  DATE OF ADMISSION:  05/23/2015  PRIMARY CARE PHYSICIAN: No primary care provider on file.   REQUESTING/REFERRING PHYSICIAN: SUNG JADE MD  CHIEF COMPLAINT:   Chief Complaint  Patient presents with  . Seizures    HISTORY OF PRESENT ILLNESS: Sherri Davidson  is a 59 y.o. female with a known history of CVA with hemorrhagic stroke, left hemiparesis, hypertension, diabetes mellitus, end-stage renal disease on peritoneal dialysis, congestive heart failure, hypothyroidism was brought by the EMS today after the patient had a seizure. According to the family member was at bedside patient became stiff and her eyes rolled over. She had a seizure and EMS gave IV Versed medication. According to the family the seizure lasted for for 2-3 minutes. Patient was lethargic and confused. Patient's sister was at bedside says patient is weaker in the left upper extremity and left leg which is more pronounced than when she had the stroke in 2010. Patient was worked up with a CT head in the emergency room which showed no evidence of new stroke. After the CT scan was done, patient more awake and responsive to verbal commands. Patient opens eyes and is able to recognize family members. No complaints of any pain. Patient has cough for the last 2 days. Family member also suggested that patient had some difficulty speech after she had a seizure. The seizure occurred around 11 PM on 05/23/2015. No history of any fall or head injury. During the workup in the emergency room chest x-ray showed pneumonia and patient's lactate level was elevated. No history of any fever or any chills.  PAST MEDICAL HISTORY:   Past Medical History  Diagnosis Date  . CVA, old, hemiparesis ([place])   . Hypertension   . Diabetes ([place])   . ESRD on peritoneal dialysis ([place])   . CHF (congestive heart failure) ([place])   .  Hypothyroidism     PAST SURGICAL HISTORY:  Past Surgical History  Procedure Laterality Date  . Cholecystectomy    . Amputation toe    . Insertion of dialysis catheter      SOCIAL HISTORY:  Social History  Substance Use Topics  . Smoking status: Current Every Day Smoker -- 1.00 packs/day for 30 years    Types: Cigarettes  . Smokeless tobacco: Not on file  . Alcohol Use: No    FAMILY HISTORY: No family history on file. Father had CVA DRUG ALLERGIES: No Known Allergies  REVIEW OF SYSTEMS:   CONSTITUTIONAL: No fever,has fatigue and weakness.  EYES: No blurred or double vision.  EARS, NOSE, AND THROAT: No tinnitus or ear pain.  RESPIRATORY: Has cough,No shortness of breath,No wheezing or hemoptysis.  CARDIOVASCULAR: No chest pain, orthopnea, edema.  GASTROINTESTINAL: No nausea, vomiting, diarrhea or abdominal pain.  GENITOURINARY: No dysuria, hematuria.  ENDOCRINE: No polyuria, nocturia,  HEMATOLOGY: No anemia, easy bruising or bleeding SKIN: No rash or lesion. MUSCULOSKELETAL: Weakness in the left upper extremity and left leg   NEUROLOGIC: Had seizure,weakness in the left upper extremity and left leg. PSYCHIATRY: No anxiety or depression.   MEDICATIONS AT HOME:  Prior to Admission medications   Medication Sig Start Date End Date Taking? Authorizing Provider  aspirin EC 81 MG tablet Take 81 mg by mouth daily.   Yes Historical Provider, MD  ciprofloxacin (CIPRO) 500 MG tablet Take 500 mg by mouth See admin instructions.  Take 1 tablet by mouth once a day after dialysis in case of peritonitis   Yes Historical Provider, MD  citalopram (CELEXA) 10 MG tablet Take 10 mg by mouth daily.   Yes Historical Provider, MD  clopidogrel (PLAVIX) 75 MG tablet Take 75 mg by mouth daily.   Yes Historical Provider, MD  hydrOXYzine (VISTARIL) 25 MG capsule Take 25 mg by mouth daily as needed for itching.   Yes Historical Provider, MD  levothyroxine (SYNTHROID, LEVOTHROID) 25 MCG tablet Take 25  mcg by mouth daily before breakfast.   Yes Historical Provider, MD  mirtazapine (REMERON) 15 MG tablet Take 15 mg by mouth at bedtime.   Yes Historical Provider, MD  pantoprazole (PROTONIX) 40 MG tablet Take 40 mg by mouth daily.   Yes Historical Provider, MD  potassium chloride SA (K-DUR,KLOR-CON) 20 MEQ tablet Take 20 mEq by mouth at bedtime.   Yes Historical Provider, MD  simvastatin (ZOCOR) 20 MG tablet Take 20 mg by mouth daily.   Yes Historical Provider, MD  Vitamin D, Ergocalciferol, (DRISDOL) 50000 UNITS CAPS capsule Take 50,000 Units by mouth every 30 (thirty) days.   Yes Historical Provider, MD      PHYSICAL EXAMINATION:   VITAL SIGNS: Blood pressure 122/78, pulse 102, temperature 97.3 F (36.3 C), temperature source Oral, resp. rate 20, height '5\' 5"'$  (1.651 m), weight 70.308 kg (155 lb), SpO2 99 %.  GENERAL:  59 y.o.-year-old patient lying in the bed in no acute distress EYES: Pupils equal, round, reactive to light and accommodation. No scleral icterus. Extraocular muscles intact.  HEENT: Head atraumatic, normocephalic. Oropharynx and nasopharynx clear.  NECK:  Supple, no jugular venous distention. No thyroid enlargement, no tenderness.  LUNGS: Normal breath sounds bilaterally, rales,rhonchi heard in the right lung base. No use of accessory muscles of respiration.  CARDIOVASCULAR: S1, S2 normal. No murmurs, rubs, or gallops.  ABDOMEN: Soft, nontender, nondistended. Bowel sounds present. No organomegaly or mass.  EXTREMITIES: No pedal edema, cyanosis, or clubbing.  NEUROLOGIC: Cranial nerves II through XII are intact,power 5/5 right upper and right lower extremity,2/5 left upper extremity,3/5 left lower extremity. Sensation intact. Gait coud not be checked.  PSYCHIATRIC: Mood pleasant SKIN: No obvious rash, lesion, or ulcer.   LABORATORY PANEL:   CBC  Recent Labs Lab 05/23/15 2334  WBC 20.5*  HGB 12.1  HCT 37.6  PLT 401  MCV 94.5  MCH 30.5  MCHC 32.3  RDW 13.9   LYMPHSABS 3.0  MONOABS 0.9  EOSABS 0.2  BASOSABS 0.2*   ------------------------------------------------------------------------------------------------------------------  Chemistries   Recent Labs Lab 05/23/15 2334  NA 138  K 4.1  CL 107  CO2 15*  GLUCOSE 214*  BUN 31*  CREATININE 6.43*  CALCIUM 8.8*  AST 39  ALT 17  ALKPHOS 124  BILITOT 0.3   ------------------------------------------------------------------------------------------------------------------ estimated creatinine clearance is 9.3 mL/min (by C-G formula based on Cr of 6.43). ------------------------------------------------------------------------------------------------------------------ No results for input(s): TSH, T4TOTAL, T3FREE, THYROIDAB in the last 72 hours.  Invalid input(s): FREET3   Coagulation profile  Recent Labs Lab 05/23/15 2334  INR 1.10   ------------------------------------------------------------------------------------------------------------------- No results for input(s): DDIMER in the last 72 hours. -------------------------------------------------------------------------------------------------------------------  Cardiac Enzymes  Recent Labs Lab 05/23/15 2334  TROPONINI <0.03   ------------------------------------------------------------------------------------------------------------------ Invalid input(s): POCBNP  ---------------------------------------------------------------------------------------------------------------  Urinalysis    Component Value Date/Time   COLORURINE Yellow 05/30/2014 1618   APPEARANCEUR Clear 05/30/2014 1618   LABSPEC 1.009 05/30/2014 1618   PHURINE 7.0 05/30/2014 1618   GLUCOSEU >=500 05/30/2014 1618  HGBUR Negative 05/30/2014 1618   BILIRUBINUR Negative 05/30/2014 1618   KETONESUR Negative 05/30/2014 1618   PROTEINUR 100 mg/dL 05/30/2014 1618   NITRITE Negative 05/30/2014 1618   LEUKOCYTESUR Negative 05/30/2014 1618      RADIOLOGY: Ct Head Wo Contrast  05/24/2015  CLINICAL DATA:  Acute onset of seizures.  Initial encounter. EXAM: CT HEAD WITHOUT CONTRAST TECHNIQUE: Contiguous axial images were obtained from the base of the skull through the vertex without intravenous contrast. COMPARISON:  CT of the head performed 07/18/2009 FINDINGS: There is no evidence of acute infarction, mass lesion, or intra- or extra-axial hemorrhage on CT. Prominence of the ventricles and sulci reflects mild cortical volume loss. Scattered periventricular and subcortical white matter change likely reflects small vessel ischemic microangiopathy. A chronic infarct is noted at the high right parietal lobe, with associated encephalomalacia and mild ex vacuo dilatation of the right lateral ventricle. The brainstem and fourth ventricle are within normal limits. No mass effect or midline shift is seen. There is no evidence of fracture; visualized osseous structures are unremarkable in appearance. The visualized portions of the orbits are within normal limits. The paranasal sinuses and mastoid air cells are well-aerated. No significant soft tissue abnormalities are seen. IMPRESSION: 1. No acute intracranial pathology seen on CT. 2. Mild cortical volume loss and scattered small vessel ischemic microangiopathy. 3. Chronic infarct at the high right parietal lobe, with associated encephalomalacia and mild ex vacuo dilatation of the right lateral ventricle. Electronically Signed   By: Garald Balding M.D.   On: 05/24/2015 00:01   Dg Chest Port 1 View  05/23/2015  CLINICAL DATA:  Acute onset of seizures.  Initial encounter. EXAM: PORTABLE CHEST 1 VIEW COMPARISON:  Chest radiograph performed 07/14/2013 FINDINGS: A small right pleural effusion is noted. Right basilar airspace opacity is seen. The left lung appears relatively clear. No pneumothorax is identified. The cardiomediastinal silhouette is normal in size. No acute osseous abnormalities are seen.  IMPRESSION: Small right pleural effusion noted. Right basilar airspace opacity seen. This raises concern for pneumonia, depending on the patient's symptoms. If symptoms are compatible with pneumonia, follow-up PA and lateral chest X-ray is recommended in 3-4 weeks following trial of antibiotic therapy to ensure resolution and exclude underlying malignancy. Electronically Signed   By: Garald Balding M.D.   On: 05/23/2015 23:51    EKG: Orders placed or performed during the hospital encounter of 05/23/15  . ED EKG  . ED EKG    IMPRESSION AND PLAN: 1. New onset seizure 2. Right lung pneumonia 3. Leukocytosis 4. CVA with left hemiparesis 5. End-stage renal disease on peritoneal dialysis 6. Hypertension 7. Diabetes mellitus 8.Clostridium Difficile Colitis Treatment plan 1. Admit patient to telemetry under inpatient service 2. Start patient on IV vancomycin and IV Zosyn antibiotics and follow-up culture 3. IV hydration with normal saline 4. Neurology consultation for seizure 5. Avalide patient with MRI brain and carotid ultrasound to rule out new CVA in view of pronounced left-sided weakness 6. Resume peritoneal dialysis 7. Start aspirin and Plavix 8. Bedside swallow study 9. DVT prophylaxis subcutaneous heparin. 10. Oral flagyl 500 mg q 8hrly  All the records are reviewed and case discussed with ED provider. Management plans discussed with the patient, family and they are in agreement.  [place] power of attorney : Patient's sister    TOTAL TIME TAKING CARE OF THIS PATIENT: 48 minutes.    Saundra Shelling M.D on 05/24/2015 at 1:21 AM  Between 7am to 6pm - Pager -  [phone]  After 6pm go to [email or web address] - password [email or web address] [facility]  [facility]  Office  [phone]  CC: Primary care physician; No primary care provider on file.

## 2015-05-24 NOTE — Consult Note (Signed)
Reason for Consult: seizure Referring Physician:  Dr. Carey Bullocks Sherri Davidson is an 59 y.o. female.  HPI:  59 yo RHD F presents to [facility] from home due to witnessed tonic clonic seizure.  Pt was also noted to have new L sided weakness in which she had some before in the past with stroke that improved completely.  Pt has never had a seizure before.  Pt does report new nausea and vomiting now but denies headache.    Past Medical History  Diagnosis Date  . CVA, old, hemiparesis ([place])   . Hypertension   . Diabetes ([place])   . ESRD on peritoneal dialysis ([place])   . CHF (congestive heart failure) ([place])   . Hypothyroidism     Past Surgical History  Procedure Laterality Date  . Cholecystectomy    . Amputation toe    . Insertion of dialysis catheter      History reviewed. No pertinent family history.  Social History:  reports that she has been smoking Cigarettes.  She has a 30 pack-year smoking history. She does not have any smokeless tobacco history on file. She reports that she does not drink alcohol or use illicit drugs.  Allergies:  Allergies  Allergen Reactions  . Asa [Aspirin] Other (See Comments)    GI upset for non-enteric coated aspirin    Medications: personally reviewed by me as per chart  Results for orders placed or performed during the hospital encounter of 05/23/15 (from the past 48 hour(s))  CBC with Differential     Status: Abnormal   Collection Time: 05/23/15 11:34 PM  Result Value Ref Range   WBC 20.5 (H) 3.6 - 11.0 K/uL   RBC 3.98 3.80 - 5.20 MIL/uL   Hemoglobin 12.1 12.0 - 16.0 g/dL   HCT 37.6 35.0 - 47.0 %   MCV 94.5 80.0 - 100.0 fL   MCH 30.5 26.0 - 34.0 pg   MCHC 32.3 32.0 - 36.0 g/dL   RDW 13.9 11.5 - 14.5 %   Platelets 401 150 - 440 K/uL   Neutrophils Relative % 79 %   Neutro Abs 16.2 (H) 1.4 - 6.5 K/uL   Lymphocytes Relative 15 %   Lymphs Abs 3.0 1.0 - 3.6 K/uL   Monocytes Relative 4 %   Monocytes Absolute 0.9 0.2 - 0.9 K/uL   Eosinophils Relative 1  %   Eosinophils Absolute 0.2 0 - 0.7 K/uL   Basophils Relative 1 %   Basophils Absolute 0.2 (H) 0 - 0.1 K/uL  Comprehensive metabolic panel     Status: Abnormal   Collection Time: 05/23/15 11:34 PM  Result Value Ref Range   Sodium 138 135 - 145 mmol/L   Potassium 4.1 3.5 - 5.1 mmol/L   Chloride 107 101 - 111 mmol/L   CO2 15 (L) 22 - 32 mmol/L   Glucose, Bld 214 (H) 65 - 99 mg/dL   BUN 31 (H) 6 - 20 mg/dL   Creatinine, Ser 6.43 (H) 0.44 - 1.00 mg/dL   Calcium 8.8 (L) 8.9 - 10.3 mg/dL   Total Protein 7.3 6.5 - 8.1 g/dL   Albumin 2.4 (L) 3.5 - 5.0 g/dL   AST 39 15 - 41 U/L   ALT 17 14 - 54 U/L   Alkaline Phosphatase 124 38 - 126 U/L   Total Bilirubin 0.3 0.3 - 1.2 mg/dL   GFR calc non Af Amer 6 (L) >60 mL/min   GFR calc Af Amer 7 (L) >60 mL/min  Comment: (NOTE) The eGFR has been calculated using the CKD EPI equation. This calculation has not been validated in all clinical situations. eGFR's persistently <60 mL/min signify possible Chronic Kidney Disease.    Anion gap 16 (H) 5 - 15  Ethanol     Status: None   Collection Time: 05/23/15 11:34 PM  Result Value Ref Range   Alcohol, Ethyl (B) <5 <5 mg/dL    Comment:        LOWEST DETECTABLE LIMIT FOR SERUM ALCOHOL IS 5 mg/dL FOR MEDICAL PURPOSES ONLY   Troponin I     Status: None   Collection Time: 05/23/15 11:34 PM  Result Value Ref Range   Troponin I <0.03 <0.031 ng/mL    Comment:        NO INDICATION OF MYOCARDIAL INJURY.   Lactic acid, plasma     Status: Abnormal   Collection Time: 05/23/15 11:34 PM  Result Value Ref Range   Lactic Acid, Venous 5.9 (HH) 0.5 - 2.0 mmol/L    Comment: CRITICAL RESULT CALLED TO, READ BACK BY AND VERIFIED WITH NOEL WEBSTER AT 0044 ON 05/24/15 RWW   Type and screen [facility]     Status: None   Collection Time: 05/23/15 11:34 PM  Result Value Ref Range   ABO/RH(D) O NEG    Antibody Screen NEG    Sample Expiration 05/26/2015   Protime-INR     Status: None    Collection Time: 05/23/15 11:34 PM  Result Value Ref Range   Prothrombin Time 14.4 11.4 - 15.0 seconds   INR 1.10   Glucose, capillary     Status: Abnormal   Collection Time: 05/23/15 11:34 PM  Result Value Ref Range   Glucose-Capillary 209 (H) 65 - 99 mg/dL  ABO/Rh     Status: None   Collection Time: 05/23/15 11:34 PM  Result Value Ref Range   ABO/RH(D) O NEG   Blood gas, arterial     Status: Abnormal   Collection Time: 05/24/15 12:15 AM  Result Value Ref Range   FIO2 0.28    Delivery systems NASAL CANNULA    pH, Arterial 7.27 (L) 7.350 - 7.450   pCO2 arterial 36 32.0 - 48.0 mmHg   pO2, Arterial 98 83.0 - 108.0 mmHg   Bicarbonate 16.5 (L) 21.0 - 28.0 mEq/L   Acid-base deficit 9.6 (H) 0.0 - 2.0 mmol/L   O2 Saturation 96.6 %   Patient temperature 37.0    Collection site RIGHT RADIAL    Sample type ARTERIAL DRAW    Allens test (pass/fail) PASS PASS  Urinalysis complete, with microscopic ([facility] only)     Status: Abnormal   Collection Time: 05/24/15  1:10 AM  Result Value Ref Range   Color, Urine YELLOW (A) YELLOW   APPearance HAZY (A) CLEAR   Glucose, UA NEGATIVE NEGATIVE mg/dL   Bilirubin Urine NEGATIVE NEGATIVE   Ketones, ur NEGATIVE NEGATIVE mg/dL   Specific Gravity, Urine 1.011 1.005 - 1.030   Hgb urine dipstick 1+ (A) NEGATIVE   pH 7.0 5.0 - 8.0   Protein, ur 100 (A) NEGATIVE mg/dL   Nitrite NEGATIVE NEGATIVE   Leukocytes, UA 2+ (A) NEGATIVE   RBC / HPF 0-5 0 - 5 RBC/hpf   WBC, UA TOO NUMEROUS TO COUNT 0 - 5 WBC/hpf   Bacteria, UA RARE (A) NONE SEEN   Squamous Epithelial / LPF 0-5 (A) NONE SEEN   WBC Clumps PRESENT   Lactic acid, plasma     Status:  Abnormal   Collection Time: 05/24/15  2:45 AM  Result Value Ref Range   Lactic Acid, Venous 4.0 (HH) 0.5 - 2.0 mmol/L    Comment: CRITICAL RESULT CALLED TO, READ BACK BY AND VERIFIED WITH NOEL WEBSTER AT 9675 05/24/15.PMH  Glucose, capillary     Status: Abnormal   Collection Time: 05/24/15  4:14 AM  Result Value Ref  Range   Glucose-Capillary 337 (H) 65 - 99 mg/dL   Comment 1 Notify RN   C difficile quick scan w PCR reflex     Status: Abnormal   Collection Time: 05/24/15  4:37 AM  Result Value Ref Range   C Diff antigen POSITIVE (A) NEGATIVE   C Diff toxin NEGATIVE NEGATIVE   C Diff interpretation      Positive for toxigenic C. difficile, active toxin production not detected. Patient has toxigenic C. difficile organisms present in the bowel, but toxin was not detected. The patient may be a carrier or the level of toxin in the sample was below the limit  of detection. This information should be used in conjunction with the patient's clinical history when deciding on possible therapy.   Clostridium Difficile by PCR     Status: Abnormal   Collection Time: 05/24/15  4:37 AM  Result Value Ref Range   Toxigenic C Difficile by pcr POSITIVE (A) NEGATIVE    Comment: CRITICAL RESULT CALLED TO, READ BACK BY AND VERIFIED WITH: MARCEL TURNER ON 05/24/15 AT 0643 BY QSD   CBC with Differential/Platelet     Status: Abnormal   Collection Time: 05/24/15  6:27 AM  Result Value Ref Range   WBC 16.0 (H) 3.6 - 11.0 K/uL   RBC 3.79 (L) 3.80 - 5.20 MIL/uL   Hemoglobin 11.6 (L) 12.0 - 16.0 g/dL   HCT 35.4 35.0 - 47.0 %   MCV 93.4 80.0 - 100.0 fL   MCH 30.6 26.0 - 34.0 pg   MCHC 32.8 32.0 - 36.0 g/dL   RDW 13.9 11.5 - 14.5 %   Platelets 276 150 - 440 K/uL   Neutrophils Relative % 89 %   Neutro Abs 14.2 (H) 1.4 - 6.5 K/uL   Lymphocytes Relative 7 %   Lymphs Abs 1.1 1.0 - 3.6 K/uL   Monocytes Relative 4 %   Monocytes Absolute 0.6 0.2 - 0.9 K/uL   Eosinophils Relative 0 %   Eosinophils Absolute 0.0 0 - 0.7 K/uL   Basophils Relative 0 %   Basophils Absolute 0.0 0 - 0.1 K/uL  Basic metabolic panel     Status: Abnormal   Collection Time: 05/24/15  6:27 AM  Result Value Ref Range   Sodium 137 135 - 145 mmol/L   Potassium 4.1 3.5 - 5.1 mmol/L   Chloride 106 101 - 111 mmol/L   CO2 20 (L) 22 - 32 mmol/L   Glucose,  Bld 284 (H) 65 - 99 mg/dL   BUN 30 (H) 6 - 20 mg/dL   Creatinine, Ser 6.18 (H) 0.44 - 1.00 mg/dL   Calcium 8.5 (L) 8.9 - 10.3 mg/dL   GFR calc non Af Amer 7 (L) >60 mL/min   GFR calc Af Amer 8 (L) >60 mL/min    Comment: (NOTE) The eGFR has been calculated using the CKD EPI equation. This calculation has not been validated in all clinical situations. eGFR's persistently <60 mL/min signify possible Chronic Kidney Disease.    Anion gap 11 5 - 15  Lactic acid, plasma     Status: Abnormal  Collection Time: 05/24/15  6:28 AM  Result Value Ref Range   Lactic Acid, Venous 2.9 (HH) 0.5 - 2.0 mmol/L    Comment: CRITICAL RESULT CALLED TO, READ BACK BY AND VERIFIED WITH DOLL FERGUSON ON 05/24/15 AT 0746 BY QSD   Glucose, capillary     Status: Abnormal   Collection Time: 05/24/15  7:46 AM  Result Value Ref Range   Glucose-Capillary 230 (H) 65 - 99 mg/dL   Comment 1 Notify RN     Ct Head Wo Contrast  05/24/2015  CLINICAL DATA:  Acute onset of seizures.  Initial encounter. EXAM: CT HEAD WITHOUT CONTRAST TECHNIQUE: Contiguous axial images were obtained from the base of the skull through the vertex without intravenous contrast. COMPARISON:  CT of the head performed 07/18/2009 FINDINGS: There is no evidence of acute infarction, mass lesion, or intra- or extra-axial hemorrhage on CT. Prominence of the ventricles and sulci reflects mild cortical volume loss. Scattered periventricular and subcortical white matter change likely reflects small vessel ischemic microangiopathy. A chronic infarct is noted at the high right parietal lobe, with associated encephalomalacia and mild ex vacuo dilatation of the right lateral ventricle. The brainstem and fourth ventricle are within normal limits. No mass effect or midline shift is seen. There is no evidence of fracture; visualized osseous structures are unremarkable in appearance. The visualized portions of the orbits are within normal limits. The paranasal sinuses and  mastoid air cells are well-aerated. No significant soft tissue abnormalities are seen. IMPRESSION: 1. No acute intracranial pathology seen on CT. 2. Mild cortical volume loss and scattered small vessel ischemic microangiopathy. 3. Chronic infarct at the high right parietal lobe, with associated encephalomalacia and mild ex vacuo dilatation of the right lateral ventricle. Electronically Signed   By: Garald Balding M.D.   On: 05/24/2015 00:01   Mr Brain Wo Contrast  05/24/2015  CLINICAL DATA:  59 year old female with chronic cerebral infarcts. End-stage renal disease on dialysis. Acute seizure. Initial encounter. EXAM: MRI HEAD WITHOUT CONTRAST TECHNIQUE: Multiplanar, multiecho pulse sequences of the brain and surrounding structures were obtained without intravenous contrast. COMPARISON:  Head CTs without contrast 05/23/2015 and earlier. Report of 11/04/2008 brain MRI (no images available). FINDINGS: Study is intermittently degraded by motion artifact despite repeated imaging attempts. Rapid scanning technique had to be utilized. Chronic encephalomalacia in the posterior right MCA and right PCA territories with volume loss and ex vacuo enlargement of the right lateral ventricle. On diffusion-weighted imaging today there is moderately restricted diffusion throughout the right hippocampal complex (series 100, image 22) associated with mild increased T2 and FLAIR hyperintensity. No associated hemorrhage or mass effect. There is also a small focus of cortical versus sulcal diffusion abnormality in the left occipital pole. See series 100, image 24 and series 101, image 7. No acute hemorrhage identified here on yesterday's head CT. No other evidence on this study of extra-axial blood or collection. No intraventricular hemorrhage. No other restricted diffusion. Major intracranial vascular flow voids are preserved. Patchy and confluent superimposed right greater than left cerebral white matter T2 and FLAIR hyperintensity.  No intracranial mass effect. Chronic deep gray matter lacunar infarcts bilaterally including in the left thalamus. No midline shift, mass effect, evidence of mass lesion, ventriculomegaly. Cervicomedullary junction and pituitary are within normal limits. Grossly negative visualized cervical spine. Mastoids and paranasal sinuses are clear. Negative orbit and scalp soft tissues. IMPRESSION: 1. Abnormal right hippocampus with restricted diffusion and T2 hyperintensity. In light of clinical history favor this reflects acute seizure/status  epilepticus. 2. Superimposed small left occipital pole focus of diffusion abnormality is most compatible with small Acute left PCA territory Infarct. 3. No associated hemorrhage or intracranial mass effect. 4. Underlying advanced right MCA and PCA chronic ischemia with encephalomalacia. Superimposed bilateral deep gray matter chronic small vessel disease. Electronically Signed   By: Genevie Ann M.D.   On: 05/24/2015 11:40   Dg Chest Port 1 View  05/23/2015  CLINICAL DATA:  Acute onset of seizures.  Initial encounter. EXAM: PORTABLE CHEST 1 VIEW COMPARISON:  Chest radiograph performed 07/14/2013 FINDINGS: A small right pleural effusion is noted. Right basilar airspace opacity is seen. The left lung appears relatively clear. No pneumothorax is identified. The cardiomediastinal silhouette is normal in size. No acute osseous abnormalities are seen. IMPRESSION: Small right pleural effusion noted. Right basilar airspace opacity seen. This raises concern for pneumonia, depending on the patient's symptoms. If symptoms are compatible with pneumonia, follow-up PA and lateral chest X-ray is recommended in 3-4 weeks following trial of antibiotic therapy to ensure resolution and exclude underlying malignancy. Electronically Signed   By: Garald Balding M.D.   On: 05/23/2015 23:51    Review of Systems  Constitutional: Negative.   HENT: Negative.   Eyes: Negative.   Respiratory: Negative.    Cardiovascular: Negative.   Gastrointestinal: Positive for nausea, vomiting and diarrhea. Negative for heartburn, abdominal pain, constipation, blood in stool and melena.  Genitourinary: Positive for dysuria. Negative for urgency, frequency, hematuria and flank pain.  Skin: Negative.   Neurological: Positive for dizziness and focal weakness. Negative for tingling, tremors, sensory change, speech change, seizures and loss of consciousness.  Endo/Heme/Allergies: Negative.   Psychiatric/Behavioral: Negative.    Blood pressure 179/88, pulse 87, temperature 97.7 F (36.5 C), temperature source Oral, resp. rate 18, height _0  (1.651 m), weight 59.194 kg (130 lb 8 oz), SpO2 98 %. Physical Exam  Nursing note and vitals reviewed. Constitutional: She appears well-developed and well-nourished. No distress.  HENT:  Head: Normocephalic and atraumatic.  Right Ear: External ear normal.  Left Ear: External ear normal.  Nose: Nose normal.  Mouth/Throat: Oropharynx is clear and moist.  Eyes: Conjunctivae and EOM are normal. Pupils are equal, round, and reactive to light. No scleral icterus.  Neck: Normal range of motion. Neck supple.  Cardiovascular: Normal rate, regular rhythm, normal heart sounds and intact distal pulses.   No murmur heard. Respiratory: Breath sounds normal. No respiratory distress.  GI: Soft. Bowel sounds are normal. She exhibits no distension.  Musculoskeletal: Normal range of motion. She exhibits no edema or tenderness.  Neurological:  A+Ox4, mild dysarthria, nl language  PERRLA, EOMI, nl VF, mild L droop, tongue midline 2/5 L, 5/5 R, nl tone, no tremor FTN WNL on R, too weak on L 2+/4 L, 1+/4 R, babinski on L, plantar mute on R No neglect, decreased temp on L hemibody  Skin: Skin is warm. She is not diaphoretic.  Psychiatric: She has a normal mood and affect.   MRI of brain personally reviewed by me and shows DWI changes in the R insular area without ADC changes and there  is an old infarct in the R occipital lobe  Assessment/Plan: 1.  Status epilepticus-  Pt now clinically appears to not be having further seizures that generalize but could still be having focal seizures causing weakness.  Pt is at risk for decline and possible neurologic injury if seizures continue.  These seizures are provoked by both infections that pt has. 2.  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  Infections-  Being treated -  We do not have ability to do EEG at this facility over the weekend, if there is any clinical change then would transfer to facility that can do continuous monitoring but as of now pt can remain here -  Load dilantin 1gm IV now then 373m qHS, check level in am -  Continue to treat UTI and C.diff but would recommend another antibiotic besides Flagyl because it lowers the seizure threshold -  Keep Mg > 2, Ca > 8 and Na > 130 -  Will follow   40 minutes of critical care time spent, examining pt, reviewing chart and coordinating care  [place] Mariateresa Batra 05/24/2015, 12:34 PM

## 2015-05-24 NOTE — ED Notes (Signed)
[facility] lab, LACTATE 5.9, Dr Beather Arbour notified

## 2015-05-24 NOTE — Progress Notes (Signed)
ANTIBIOTIC CONSULT NOTE - INITIAL  Pharmacy Consult for Vancomycin/Zosyn Dosing Indication: pneumonia  Allergies  Allergen Reactions  . Asa [Aspirin] Other (See Comments)    GI upset for non-enteric coated aspirin    Patient Measurements: Height: '5\' 5"'$  (165.1 cm) Weight: 130 lb 8 oz (59.194 kg) IBW/kg (Calculated) : 57   Vital Signs: Temp: 97.7 F (36.5 C) (12/10 0354) Temp Source: Oral (12/10 0354) BP: 179/88 mmHg (12/10 0639) Pulse Rate: 87 (12/10 0639) Intake/Output from previous day:   Intake/Output from this shift:    Labs:  Recent Labs  05/23/15 2334 05/24/15 0627  WBC 20.5* 16.0*  HGB 12.1 11.6*  PLT 401 276  CREATININE 6.43* 6.18*   Estimated Creatinine Clearance: 8.8 mL/min (by C-G formula based on Cr of 6.18). No results for input(s): VANCOTROUGH, VANCOPEAK, VANCORANDOM, GENTTROUGH, GENTPEAK, GENTRANDOM, TOBRATROUGH, TOBRAPEAK, TOBRARND, AMIKACINPEAK, AMIKACINTROU, AMIKACIN in the last 72 hours.   Microbiology: Recent Results (from the past 720 hour(s))  C difficile quick scan w PCR reflex     Status: Abnormal (Preliminary result)   Collection Time: 05/24/15  4:37 AM  Result Value Ref Range Status   C Diff antigen POSITIVE (A) NEGATIVE Final   C Diff toxin NEGATIVE NEGATIVE Final   C Diff interpretation PENDING  Incomplete  Clostridium Difficile by PCR     Status: Abnormal   Collection Time: 05/24/15  4:37 AM  Result Value Ref Range Status   Toxigenic C Difficile by pcr POSITIVE (A) NEGATIVE Final    Comment: CRITICAL RESULT CALLED TO, READ BACK BY AND VERIFIED WITH: MARCEL TURNER ON 05/24/15 AT 0643 BY QSD     Medical History: Past Medical History  Diagnosis Date  . CVA, old, hemiparesis ([place])   . Hypertension   . Diabetes ([place])   . ESRD on peritoneal dialysis ([place])   . CHF (congestive heart failure) ([place])   . Hypothyroidism     Medications:  Scheduled:  . aspirin EC  81 mg Oral Daily  . citalopram  10 mg Oral Daily  . clopidogrel   75 mg Oral Daily  . heparin  5,000 Units Subcutaneous 3 times per day  . insulin aspart  0-5 Units Subcutaneous QHS  . insulin aspart  0-9 Units Subcutaneous TID WC  . levothyroxine  25 mcg Oral QAC breakfast  . metroNIDAZOLE  500 mg Oral 3 times per day  . mirtazapine  15 mg Oral QHS  . pantoprazole  40 mg Oral QAC breakfast  . piperacillin-tazobactam (ZOSYN)  IV  3.375 g Intravenous Q12H  . simvastatin  20 mg Oral Daily  . [START ON 05/26/2015] Vitamin D (Ergocalciferol)  50,000 Units Oral Q30 days   Assessment: Pharmacy consulted to dose Vancomycin and Zosyn in a 59 yo female admitted for seizure.  Per note, patient's chest x-ray with pneumonia and lactate elevated.   Patient with ESRD requiring PD.   Vancomycin 1500 mg IV once received on 12/10.    Goal of Therapy:  Vancomycin trough level 15-20 mcg/ml  Plan:  Patient received Vancomycin loading dose of 1500 mg IV once.  Will order Vancomycin 1 gm (15 mg/kg) IV q72h prn trough <15. Trough ordered for 12/13, 3 days after last dose.  If trough <15, will administer Vancomycin 1 gm IV once.  If >15, will need to continue to hold dose and order follow up trough.   Pharmacy will continue to follow.   Murrell Converse, PharmD Clinical Pharmacist 05/24/2015

## 2015-05-24 NOTE — Progress Notes (Signed)
MD gave verbal order to discontinue order to place foley. Jessee Avers

## 2015-05-24 NOTE — Progress Notes (Addendum)
Subjective:   59 y.o. Caucasian female with end-stage renal disease on peritoneal dialysis, anemia, secondary hyperparathyroidism, diabetes type 2, malnutrition, history of stroke with left leg weakness, current smoker, presents from home due to witnessed tonic-clonic seizure, new left-sided weakness  Objective:  Vital signs in last 24 hours:  Temp:  [97.3 F (36.3 C)-98.4 F (36.9 C)] 98.4 F (36.9 C) (12/10 1346) Pulse Rate:  [87-118] 89 (12/10 1402) Resp:  [15-33] 20 (12/10 1346) BP: (92-192)/(60-94) 189/88 mmHg (12/10 1402) SpO2:  [96 %-100 %] 96 % (12/10 1346) Weight:  [59.194 kg (130 lb 8 oz)-70.308 kg (155 lb)] 59.194 kg (130 lb 8 oz) (12/10 0354)  Weight change:  Filed Weights   05/23/15 2331 05/24/15 0354  Weight: 70.308 kg (155 lb) 59.194 kg (130 lb 8 oz)    Intake/Output:    Intake/Output Summary (Last 24 hours) at 05/24/15 1521 Last data filed at 05/24/15 1356  Gross per 24 hour  Intake    318 ml  Output      0 ml  Net    318 ml     Physical Exam: General:  chronically ill-appearing   HEENT  moist mucous membranes   Neck  supple   Pulm/lungs  clear to auscultation bilaterally, normal effort   CVS/Heart  irregular rhythm   Abdomen:   soft, mildly distended   Extremities:  no peripheral edema   Neurologic:  alert, following commands   Skin:  chronic excoriation lesions   Access:  PD catheter        Basic Metabolic Panel:   Recent Labs Lab 05/23/15 2334 05/24/15 0627  NA 138 137  K 4.1 4.1  CL 107 106  CO2 15* 20*  GLUCOSE 214* 284*  BUN 31* 30*  CREATININE 6.43* 6.18*  CALCIUM 8.8* 8.5*     CBC:  Recent Labs Lab 05/23/15 2334 05/24/15 0627  WBC 20.5* 16.0*  NEUTROABS 16.2* 14.2*  HGB 12.1 11.6*  HCT 37.6 35.4  MCV 94.5 93.4  PLT 401 276      Microbiology:  Recent Results (from the past 720 hour(s))  Urine culture     Status: None (Preliminary result)   Collection Time: 05/24/15  1:10 AM  Result Value Ref Range Status    Specimen Description URINE, RANDOM  Final   Special Requests Normal  Final   Culture NO GROWTH < 12 HOURS  Final   Report Status PENDING  Incomplete  C difficile quick scan w PCR reflex     Status: Abnormal   Collection Time: 05/24/15  4:37 AM  Result Value Ref Range Status   C Diff antigen POSITIVE (A) NEGATIVE Final   C Diff toxin NEGATIVE NEGATIVE Final   C Diff interpretation   Final    Positive for toxigenic C. difficile, active toxin production not detected. Patient has toxigenic C. difficile organisms present in the bowel, but toxin was not detected. The patient may be a carrier or the level of toxin in the sample was below the limit  of detection. This information should be used in conjunction with the patient's clinical history when deciding on possible therapy.   Clostridium Difficile by PCR     Status: Abnormal   Collection Time: 05/24/15  4:37 AM  Result Value Ref Range Status   Toxigenic C Difficile by pcr POSITIVE (A) NEGATIVE Final    Comment: CRITICAL RESULT CALLED TO, READ BACK BY AND VERIFIED WITH: MARCEL TURNER ON 05/24/15 AT 0643 BY QSD  Coagulation Studies:  Recent Labs  05/23/15 2334  LABPROT 14.4  INR 1.10    Urinalysis:  Recent Labs  05/24/15 0110  COLORURINE YELLOW*  LABSPEC 1.011  PHURINE 7.0  GLUCOSEU NEGATIVE  HGBUR 1+*  BILIRUBINUR NEGATIVE  KETONESUR NEGATIVE  PROTEINUR 100*  NITRITE NEGATIVE  LEUKOCYTESUR 2+*      Imaging: Ct Head Wo Contrast  05/24/2015  CLINICAL DATA:  Acute onset of seizures.  Initial encounter. EXAM: CT HEAD WITHOUT CONTRAST TECHNIQUE: Contiguous axial images were obtained from the base of the skull through the vertex without intravenous contrast. COMPARISON:  CT of the head performed 07/18/2009 FINDINGS: There is no evidence of acute infarction, mass lesion, or intra- or extra-axial hemorrhage on CT. Prominence of the ventricles and sulci reflects mild cortical volume loss. Scattered periventricular and  subcortical white matter change likely reflects small vessel ischemic microangiopathy. A chronic infarct is noted at the high right parietal lobe, with associated encephalomalacia and mild ex vacuo dilatation of the right lateral ventricle. The brainstem and fourth ventricle are within normal limits. No mass effect or midline shift is seen. There is no evidence of fracture; visualized osseous structures are unremarkable in appearance. The visualized portions of the orbits are within normal limits. The paranasal sinuses and mastoid air cells are well-aerated. No significant soft tissue abnormalities are seen. IMPRESSION: 1. No acute intracranial pathology seen on CT. 2. Mild cortical volume loss and scattered small vessel ischemic microangiopathy. 3. Chronic infarct at the high right parietal lobe, with associated encephalomalacia and mild ex vacuo dilatation of the right lateral ventricle. Electronically Signed   By: Garald Balding M.D.   On: 05/24/2015 00:01   Mr Brain Wo Contrast  05/24/2015  CLINICAL DATA:  59 year old female with chronic cerebral infarcts. End-stage renal disease on dialysis. Acute seizure. Initial encounter. EXAM: MRI HEAD WITHOUT CONTRAST TECHNIQUE: Multiplanar, multiecho pulse sequences of the brain and surrounding structures were obtained without intravenous contrast. COMPARISON:  Head CTs without contrast 05/23/2015 and earlier. Report of 11/04/2008 brain MRI (no images available). FINDINGS: Study is intermittently degraded by motion artifact despite repeated imaging attempts. Rapid scanning technique had to be utilized. Chronic encephalomalacia in the posterior right MCA and right PCA territories with volume loss and ex vacuo enlargement of the right lateral ventricle. On diffusion-weighted imaging today there is moderately restricted diffusion throughout the right hippocampal complex (series 100, image 22) associated with mild increased T2 and FLAIR hyperintensity. No associated  hemorrhage or mass effect. There is also a small focus of cortical versus sulcal diffusion abnormality in the left occipital pole. See series 100, image 24 and series 101, image 7. No acute hemorrhage identified here on yesterday's head CT. No other evidence on this study of extra-axial blood or collection. No intraventricular hemorrhage. No other restricted diffusion. Major intracranial vascular flow voids are preserved. Patchy and confluent superimposed right greater than left cerebral white matter T2 and FLAIR hyperintensity. No intracranial mass effect. Chronic deep gray matter lacunar infarcts bilaterally including in the left thalamus. No midline shift, mass effect, evidence of mass lesion, ventriculomegaly. Cervicomedullary junction and pituitary are within normal limits. Grossly negative visualized cervical spine. Mastoids and paranasal sinuses are clear. Negative orbit and scalp soft tissues. IMPRESSION: 1. Abnormal right hippocampus with restricted diffusion and T2 hyperintensity. In light of clinical history favor this reflects acute seizure/status epilepticus. 2. Superimposed small left occipital pole focus of diffusion abnormality is most compatible with small Acute left PCA territory Infarct. 3. No associated hemorrhage or  intracranial mass effect. 4. Underlying advanced right MCA and PCA chronic ischemia with encephalomalacia. Superimposed bilateral deep gray matter chronic small vessel disease. Electronically Signed   By: Genevie Ann M.D.   On: 05/24/2015 11:40   US Carotid Bilateral  05/24/2015  CLINICAL DATA:  STROKE SYMPTOMS, HYPERLIPIDEMIA, DIABETES AND SMOKING HISTORY. EXAM: BILATERAL CAROTID DUPLEX ULTRASOUND TECHNIQUE: Pearline Cables scale imaging, color Doppler and duplex ultrasound were performed of bilateral carotid and vertebral arteries in the neck. COMPARISON:  05/24/2015 MRI FINDINGS: Criteria: Quantification of carotid stenosis is based on velocity parameters that correlate the residual internal  carotid diameter with NASCET-based stenosis levels, using the diameter of the distal internal carotid lumen as the denominator for stenosis measurement. The following velocity measurements were obtained: RIGHT ICA:  57/12 cm/sec CCA:  25/4 cm/sec SYSTOLIC ICA/CCA RATIO:  0.7 DIASTOLIC ICA/CCA RATIO:  1.9 ECA:  230 cm/sec LEFT ICA:  88/15 cm/sec CCA:  27/0 cm/sec SYSTOLIC ICA/CCA RATIO:  1.0 DIASTOLIC ICA/CCA RATIO:  1.5 ECA:  109 cm/sec RIGHT CAROTID ARTERY: Moderate echogenic shadowing plaque formation. No hemodynamically significant right ICA stenosis, velocity elevation, or turbulent flow. Degree of narrowing less than 50%. RIGHT VERTEBRAL ARTERY:  Antegrade LEFT CAROTID ARTERY: Moderate heterogeneous echogenic plaque formation. No hemodynamically significant left ICA stenosis, velocity elevation, or turbulent flow. Degree of narrowing also less than 50% LEFT VERTEBRAL ARTERY: Retrograde, compatible with subclavian steal phenomenon. IMPRESSION: Moderate bilateral carotid atherosclerosis. No hemodynamically significant ICA stenosis. Degree of narrowing less than 50% bilaterally. 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 Electronically Signed   By: Jerilynn Mages.  Shick M.D.   On: 05/24/2015 13:12   Dg Chest Port 1 View  05/23/2015  CLINICAL DATA:  Acute onset of seizures.  Initial encounter. EXAM: PORTABLE CHEST 1 VIEW COMPARISON:  Chest radiograph performed 07/14/2013 FINDINGS: A small right pleural effusion is noted. Right basilar airspace opacity is seen. The left lung appears relatively clear. No pneumothorax is identified. The cardiomediastinal silhouette is normal in size. No acute osseous abnormalities are seen. IMPRESSION: Small right pleural effusion noted. Right basilar airspace opacity seen. This raises concern for pneumonia, depending on the patient's symptoms. If symptoms are compatible with pneumonia, follow-up PA and lateral chest X-ray is  recommended in 3-4 weeks following trial of antibiotic therapy to ensure resolution and exclude underlying malignancy. Electronically Signed   By: Garald Balding M.D.   On: 05/23/2015 23:51     Medications:     . aspirin EC  81 mg Oral Daily  . citalopram  10 mg Oral Daily  . clopidogrel  75 mg Oral Daily  . heparin  5,000 Units Subcutaneous 3 times per day  . insulin aspart  0-5 Units Subcutaneous QHS  . insulin aspart  0-9 Units Subcutaneous TID WC  . levothyroxine  25 mcg Oral QAC breakfast  . metoprolol tartrate  12.5 mg Oral BID  . metroNIDAZOLE  500 mg Oral 3 times per day  . mirtazapine  15 mg Oral QHS  . pantoprazole  40 mg Oral QAC breakfast  . phenytoin  300 mg Oral QHS  . piperacillin-tazobactam (ZOSYN)  IV  3.375 g Intravenous Q12H  . simvastatin  20 mg Oral Daily  . [START ON 05/26/2015] Vitamin D (Ergocalciferol)  50,000 Units Oral Q30 days   acetaminophen, hydrALAZINE, LORazepam, ondansetron (ZOFRAN) IV, vancomycin  Assessment/ Plan:  59 y.o. Caucasian female with end-stage renal disease on peritoneal dialysis, anemia, secondary hyperparathyroidism, diabetes type 2, malnutrition, history of stroke  with left leg weakness, current smoker, presents from home due to witnessed tonic-clonic seizure, new left-sided weakness   1. ESRD. Chronic  PD . Most recent kt/v 2.02.  CCPD 6 x 2000 cc as outpatient] Will schedule PD for tonight  2. HTN  - normally BP is low as outpatient  - noted to be elevated in hospital. Likely due to stroke - continue to monitor  3. Seizures - neurology evaluation ongoing - dilantin loading     LOS: 0 Natalin Bible 12/10/20163:21 PM

## 2015-05-24 NOTE — ED Notes (Signed)
Family at bedside.  Per sister (stated POA) "Dr Merita Norton and [facility] are kidney docs",

## 2015-05-24 NOTE — ED Notes (Signed)
Admitting MD at bedside.

## 2015-05-24 NOTE — Progress Notes (Addendum)
Pt still having some nausea.  Zofran dose is not due at this time,  MD Dr. Estanislado Pandy ordered one time zofran to get her to next dose.  MD made aware of lactic acid of 4-- verbal order to check again in 3 hours.  Pts family asking if she can get her immodium 2 mg tablet that she takes at home, pt having loose/watery stools-- MD wants to check stool first.  CDiff orders placed-- will test sample. Jessee Avers

## 2015-05-24 NOTE — Progress Notes (Signed)
MD Anselm Jungling was notified of abnormal MRI brain. No further orders.

## 2015-05-24 NOTE — Progress Notes (Signed)
Initial Nutrition Assessment   INTERVENTION:   Meals and Snacks: Cater to patient preferences Medical Food Supplement Therapy: if po intake inadequate, recommend addition of nutritional supplement  NUTRITION DIAGNOSIS:   Reassess on follow-up  GOAL:   Patient will meet greater than or equal to 90% of their needs  MONITOR:    (Energy Intake, Anthropometrics, Digestive System, Electrolyte/Renal Profile)  REASON FOR ASSESSMENT:   Malnutrition Screening Tool    ASSESSMENT:    Pt admitted with new onset seizures, pneumonia, diarrhea-C.diff positive; pt not in room on visit this AM, down for [place]  Past Medical History  Diagnosis Date  . CVA, old, hemiparesis ([place])   . Hypertension   . Diabetes ([place])   . ESRD on peritoneal dialysis ([place])   . CHF (congestive heart failure) ([place])   . Hypothyroidism      Diet Order:  Diet Carb Modified Fluid consistency:: Thin; Room service appropriate?: Yes  Energy Intake: no documentation of po intake, unable to assess  Skin:   (stage I sacrum)  Last BM:  12/10 diarrhea, Cdiff positive  Electrolyte and Renal Profile:  Recent Labs Lab 05/23/15 2334 05/24/15 0627  BUN 31* 30*  CREATININE 6.43* 6.18*  NA 138 137  K 4.1 4.1   Glucose Profile:   Recent Labs  05/23/15 2334 05/24/15 0414 05/24/15 0746  GLUCAP 209* 337* 230*   Nutritional Anemia Profile:  CBC Latest Ref Rng 05/24/2015 05/23/2015 05/31/2014  WBC 3.6 - 11.0 K/uL 16.0(H) 20.5(H) -  Hemoglobin 12.0 - 16.0 g/dL 11.6(L) 12.1 11.3(L)  Hematocrit 35.0 - 47.0 % 35.4 37.6 -  Platelets 150 - 440 K/uL 276 401 -    Meds: NS at 75 ml/hr, ss novolog, remeron  Nutrition Focused Physical Exam:  Unable to complete Nutrition-Focused physical exam at this time.   Height:   Ht Readings from Last 1 Encounters:  05/23/15 '5\' 5"'$  (1.651 m)    Weight: unable to assess  Wt Readings from Last 1 Encounters:  05/24/15 130 lb 8 oz (59.194 kg)    BMI:  Body mass index is  21.72 kg/(m^2).  Estimated Nutritional Needs:   Kcal:  8676-1950 kcals (BEE 1166, 1.3 AF, 1.1-1.3 IF)   Protein:  77-94 g (1.3-1.6 g/kg)   Fluid:  1475-1770 mL (25-30 ml/kg)   MODERATE Care Level  Kerman Passey MS, RD, LDN [phone] Pager

## 2015-05-24 NOTE — ED Notes (Signed)
fami

## 2015-05-24 NOTE — Progress Notes (Signed)
MD Anselm Jungling was notified that SBP is 190. MD will place orders.

## 2015-05-24 NOTE — Progress Notes (Signed)
MD Anselm Jungling was notified of lactic acid 2.9

## 2015-05-24 NOTE — Progress Notes (Addendum)
ANTIBIOTIC CONSULT NOTE - INITIAL  Pharmacy Consult for Vancomycin/Zosyn Indication: pneumonia  Allergies  Allergen Reactions  . Asa [Aspirin] Other (See Comments)    GI upset for non-enteric coated aspirin    Patient Measurements: Height: '5\' 5"'$  (165.1 cm) Weight: 155 lb (70.308 kg) IBW/kg (Calculated) : 57 Adjusted Body Weight:   Vital Signs: Temp: 97.4 F (36.3 C) (12/10 0205) Temp Source: Oral (12/10 0205) BP: 165/84 mmHg (12/10 0205) Pulse Rate: 96 (12/10 0205) Intake/Output from previous day:   Intake/Output from this shift:    Labs:  Recent Labs  05/23/15 2334  WBC 20.5*  HGB 12.1  PLT 401  CREATININE 6.43*   Estimated Creatinine Clearance: 9.3 mL/min (by C-G formula based on Cr of 6.43). No results for input(s): VANCOTROUGH, VANCOPEAK, VANCORANDOM, GENTTROUGH, GENTPEAK, GENTRANDOM, TOBRATROUGH, TOBRAPEAK, TOBRARND, AMIKACINPEAK, AMIKACINTROU, AMIKACIN in the last 72 hours.   Microbiology: No results found for this or any previous visit (from the past 720 hour(s)).  Medical History: Past Medical History  Diagnosis Date  . CVA, old, hemiparesis ([place])   . Hypertension   . Diabetes ([place])   . ESRD on peritoneal dialysis ([place])   . CHF (congestive heart failure) ([place])   . Hypothyroidism     Medications:  Infusions:   Assessment: 59 yof cc seizure. ESRD on PD. CXR showed PNA, starting vancomycin and Zosyn. Pharmacokinetics of vancomycin in peritoneal dialysis altered - will dose based off random levels.  Initial estimated PK: Vd 63 L, T1/2 57 hr, Ke 0.012 hr-1 - low utility for PD.  Goal of Therapy:  Vancomycin trough level 15-20 mcg/ml  Plan:  Expected duration 7 days with resolution of temperature and/or normalization of WBC. Zosyn 2.25 gm IV Q12H and vancomycin 1.5 gm IV x 1 (approximately 25 mg/kg load) followed by 1 gm (approximately 15 mg/kg) IV Q 3 to 5 days based on random levels. Pharmacy will continue to follow and adjust as needed to  maintain vancomycin levels 15 to 20 mcg/mL.  Laural Benes, Pharm.D., BCPS Clinical Pharmacist 05/24/2015,2:19 AM

## 2015-05-24 NOTE — Progress Notes (Signed)
A & O x2. NSR. 2 L of oxygen. MRI was neg. US carotid showed less than 50% bilat. FS are stable. Pt reports no pain.Takes meds ok. Nephr consulted due to PD. Zofran was given when nauseous. Neuro consult was completed. Incontinent. Stage 1 on sacral. IV hydralyzine given for SBP greater than 160. Pt has no further concerns at this time.

## 2015-05-24 NOTE — Progress Notes (Signed)
MD, Dr. Estanislado Pandy made aware of positive cdiff antigen.  MD to place order for flagyl and isolation orders. Sherri Davidson

## 2015-05-25 LAB — GLUCOSE, CAPILLARY
GLUCOSE-CAPILLARY: 140 mg/dL — AB (ref 65–99)
GLUCOSE-CAPILLARY: 128 mg/dL — AB (ref 65–99)
Glucose-Capillary: 132 mg/dL — ABNORMAL HIGH (ref 65–99)

## 2015-05-25 LAB — PHENYTOIN LEVEL, TOTAL: PHENYTOIN LVL: 9.8 ug/mL — AB (ref 10.0–20.0)

## 2015-05-25 MED ORDER — NICOTINE 21 MG/24HR TD PT24
21.0000 mg | MEDICATED_PATCH | Freq: Every day | TRANSDERMAL | Status: DC
  Administered 2015-05-25 – 2015-05-28 (×3): 21 mg via TRANSDERMAL
  Filled 2015-05-25 (×4): qty 1

## 2015-05-25 MED ORDER — DELFLEX-LC/1.5% DEXTROSE 346 MOSM/L IP SOLN
INTRAPERITONEAL | Status: DC
  Administered 2015-05-25 – 2015-05-27 (×2): via INTRAPERITONEAL
  Filled 2015-05-25 (×4): qty 3000

## 2015-05-25 MED ORDER — SODIUM CHLORIDE 0.9 % IV SOLN
500.0000 mg | Freq: Two times a day (BID) | INTRAVENOUS | Status: DC
  Administered 2015-05-25 – 2015-05-28 (×6): 500 mg via INTRAVENOUS
  Filled 2015-05-25 (×8): qty 5

## 2015-05-25 MED ORDER — DEXTROSE 5 % IV SOLN
1.0000 g | INTRAVENOUS | Status: DC
  Administered 2015-05-25 – 2015-05-27 (×3): 1 g via INTRAVENOUS
  Filled 2015-05-25 (×4): qty 10

## 2015-05-25 MED ORDER — SODIUM CHLORIDE 0.9 % IV SOLN: 500.0000 mg | Freq: Once | INTRAVENOUS | Status: DC

## 2015-05-25 NOTE — Progress Notes (Signed)
NSR. ROom air. FS are stable. Pt reports HA and received tylenol. Pt still having loose stools. Iso for c diff. Nicotine patch was added to L shoulder. Alert to self only. Takes meds ok. Pt has no further concerns at this time.

## 2015-05-25 NOTE — Progress Notes (Signed)
Pt peritonal dialysis machine beeping. Nurse contacted on call dialysis technician. No response. Two peritoneal bags administered and completed. Still awaiting response from dialysis technician. Will continue to monitor.   Iran Sizer M

## 2015-05-25 NOTE — Progress Notes (Addendum)
Subjective:   59 y.o. Caucasian female with end-stage renal disease on peritoneal dialysis, anemia, secondary hyperparathyroidism, diabetes type 2, malnutrition, history of stroke with left leg weakness, current smoker, presents from home due to witnessed tonic-clonic seizure, new left-sided weakness  Still appears somewhat confused today Tolerated PD well last night C Diff positive  Objective:  Vital signs in last 24 hours:  Temp:  [97.4 F (36.3 C)-98.4 F (36.9 C)] 97.4 F (36.3 C) (12/11 0352) Pulse Rate:  [66-107] 72 (12/11 0352) Resp:  [18-20] 18 (12/11 0352) BP: (115-192)/(66-94) 129/72 mmHg (12/11 0352) SpO2:  [96 %-100 %] 100 % (12/11 0700)  Weight change:  Filed Weights   05/23/15 2331 05/24/15 0354  Weight: 70.308 kg (155 lb) 59.194 kg (130 lb 8 oz)    Intake/Output:    Intake/Output Summary (Last 24 hours) at 05/25/15 1231 Last data filed at 05/25/15 0855  Gross per 24 hour  Intake  12318 ml  Output  10933 ml  Net   1385 ml     Physical Exam: General:  chronically ill-appearing   HEENT  moist mucous membranes   Neck  supple   Pulm/lungs  clear to auscultation bilaterally, normal effort   CVS/Heart  irregular rhythm   Abdomen:   soft, mildly distended   Extremities:  no peripheral edema   Neurologic:  alert, following commands   Skin:  chronic excoriation lesions   Access:  PD catheter        Basic Metabolic Panel:   Recent Labs Lab 05/23/15 2334 05/24/15 0627  NA 138 137  K 4.1 4.1  CL 107 106  CO2 15* 20*  GLUCOSE 214* 284*  BUN 31* 30*  CREATININE 6.43* 6.18*  CALCIUM 8.8* 8.5*     CBC:  Recent Labs Lab 05/23/15 2334 05/24/15 0627  WBC 20.5* 16.0*  NEUTROABS 16.2* 14.2*  HGB 12.1 11.6*  HCT 37.6 35.4  MCV 94.5 93.4  PLT 401 276      Microbiology:  Recent Results (from the past 720 hour(s))  Culture, blood (routine x 2)     Status: None (Preliminary result)   Collection Time: 05/23/15 11:34 PM  Result Value Ref  Range Status   Specimen Description BLOOD RIGHT HAND  Final   Special Requests BOTTLES DRAWN AEROBIC AND ANAEROBIC 4CC  Final   Culture NO GROWTH 1 DAY  Final   Report Status PENDING  Incomplete  Culture, blood (routine x 2)     Status: None (Preliminary result)   Collection Time: 05/24/15 12:59 AM  Result Value Ref Range Status   Specimen Description BLOOD  Final   Special Requests BOTTLES DRAWN AEROBIC AND ANAEROBIC 4CC  Final   Culture NO GROWTH 1 DAY  Final   Report Status PENDING  Incomplete  Urine culture     Status: None (Preliminary result)   Collection Time: 05/24/15  1:10 AM  Result Value Ref Range Status   Specimen Description URINE, RANDOM  Final   Special Requests Normal  Final   Culture NO GROWTH 1 DAY  Final   Report Status PENDING  Incomplete  C difficile quick scan w PCR reflex     Status: Abnormal   Collection Time: 05/24/15  4:37 AM  Result Value Ref Range Status   C Diff antigen POSITIVE (A) NEGATIVE Final   C Diff toxin NEGATIVE NEGATIVE Final   C Diff interpretation   Final    Positive for toxigenic C. difficile, active toxin production not detected. Patient has  toxigenic C. difficile organisms present in the bowel, but toxin was not detected. The patient may be a carrier or the level of toxin in the sample was below the limit  of detection. This information should be used in conjunction with the patient's clinical history when deciding on possible therapy.   Clostridium Difficile by PCR     Status: Abnormal   Collection Time: 05/24/15  4:37 AM  Result Value Ref Range Status   Toxigenic C Difficile by pcr POSITIVE (A) NEGATIVE Final    Comment: CRITICAL RESULT CALLED TO, READ BACK BY AND VERIFIED WITH: MARCEL TURNER ON 05/24/15 AT 0643 BY QSD     Coagulation Studies:  Recent Labs  05/23/15 2334  LABPROT 14.4  INR 1.10    Urinalysis:  Recent Labs  05/24/15 0110  COLORURINE YELLOW*  LABSPEC 1.011  PHURINE 7.0  GLUCOSEU NEGATIVE  HGBUR 1+*   BILIRUBINUR NEGATIVE  KETONESUR NEGATIVE  PROTEINUR 100*  NITRITE NEGATIVE  LEUKOCYTESUR 2+*      Imaging: Ct Head Wo Contrast  05/24/2015  CLINICAL DATA:  Acute onset of seizures.  Initial encounter. EXAM: CT HEAD WITHOUT CONTRAST TECHNIQUE: Contiguous axial images were obtained from the base of the skull through the vertex without intravenous contrast. COMPARISON:  CT of the head performed 07/18/2009 FINDINGS: There is no evidence of acute infarction, mass lesion, or intra- or extra-axial hemorrhage on CT. Prominence of the ventricles and sulci reflects mild cortical volume loss. Scattered periventricular and subcortical white matter change likely reflects small vessel ischemic microangiopathy. A chronic infarct is noted at the high right parietal lobe, with associated encephalomalacia and mild ex vacuo dilatation of the right lateral ventricle. The brainstem and fourth ventricle are within normal limits. No mass effect or midline shift is seen. There is no evidence of fracture; visualized osseous structures are unremarkable in appearance. The visualized portions of the orbits are within normal limits. The paranasal sinuses and mastoid air cells are well-aerated. No significant soft tissue abnormalities are seen. IMPRESSION: 1. No acute intracranial pathology seen on CT. 2. Mild cortical volume loss and scattered small vessel ischemic microangiopathy. 3. Chronic infarct at the high right parietal lobe, with associated encephalomalacia and mild ex vacuo dilatation of the right lateral ventricle. Electronically Signed   By: Garald Balding M.D.   On: 05/24/2015 00:01   Mr Brain Wo Contrast  05/24/2015  CLINICAL DATA:  59 year old female with chronic cerebral infarcts. End-stage renal disease on dialysis. Acute seizure. Initial encounter. EXAM: MRI HEAD WITHOUT CONTRAST TECHNIQUE: Multiplanar, multiecho pulse sequences of the brain and surrounding structures were obtained without intravenous contrast.  COMPARISON:  Head CTs without contrast 05/23/2015 and earlier. Report of 11/04/2008 brain MRI (no images available). FINDINGS: Study is intermittently degraded by motion artifact despite repeated imaging attempts. Rapid scanning technique had to be utilized. Chronic encephalomalacia in the posterior right MCA and right PCA territories with volume loss and ex vacuo enlargement of the right lateral ventricle. On diffusion-weighted imaging today there is moderately restricted diffusion throughout the right hippocampal complex (series 100, image 22) associated with mild increased T2 and FLAIR hyperintensity. No associated hemorrhage or mass effect. There is also a small focus of cortical versus sulcal diffusion abnormality in the left occipital pole. See series 100, image 24 and series 101, image 7. No acute hemorrhage identified here on yesterday's head CT. No other evidence on this study of extra-axial blood or collection. No intraventricular hemorrhage. No other restricted diffusion. Major intracranial vascular flow voids are  preserved. Patchy and confluent superimposed right greater than left cerebral white matter T2 and FLAIR hyperintensity. No intracranial mass effect. Chronic deep gray matter lacunar infarcts bilaterally including in the left thalamus. No midline shift, mass effect, evidence of mass lesion, ventriculomegaly. Cervicomedullary junction and pituitary are within normal limits. Grossly negative visualized cervical spine. Mastoids and paranasal sinuses are clear. Negative orbit and scalp soft tissues. IMPRESSION: 1. Abnormal right hippocampus with restricted diffusion and T2 hyperintensity. In light of clinical history favor this reflects acute seizure/status epilepticus. 2. Superimposed small left occipital pole focus of diffusion abnormality is most compatible with small Acute left PCA territory Infarct. 3. No associated hemorrhage or intracranial mass effect. 4. Underlying advanced right MCA and PCA  chronic ischemia with encephalomalacia. Superimposed bilateral deep gray matter chronic small vessel disease. Electronically Signed   By: Genevie Ann M.D.   On: 05/24/2015 11:40   US Carotid Bilateral  05/24/2015  CLINICAL DATA:  STROKE SYMPTOMS, HYPERLIPIDEMIA, DIABETES AND SMOKING HISTORY. EXAM: BILATERAL CAROTID DUPLEX ULTRASOUND TECHNIQUE: Pearline Cables scale imaging, color Doppler and duplex ultrasound were performed of bilateral carotid and vertebral arteries in the neck. COMPARISON:  05/24/2015 MRI FINDINGS: Criteria: Quantification of carotid stenosis is based on velocity parameters that correlate the residual internal carotid diameter with NASCET-based stenosis levels, using the diameter of the distal internal carotid lumen as the denominator for stenosis measurement. The following velocity measurements were obtained: RIGHT ICA:  57/12 cm/sec CCA:  25/4 cm/sec SYSTOLIC ICA/CCA RATIO:  0.7 DIASTOLIC ICA/CCA RATIO:  1.9 ECA:  230 cm/sec LEFT ICA:  88/15 cm/sec CCA:  27/0 cm/sec SYSTOLIC ICA/CCA RATIO:  1.0 DIASTOLIC ICA/CCA RATIO:  1.5 ECA:  109 cm/sec RIGHT CAROTID ARTERY: Moderate echogenic shadowing plaque formation. No hemodynamically significant right ICA stenosis, velocity elevation, or turbulent flow. Degree of narrowing less than 50%. RIGHT VERTEBRAL ARTERY:  Antegrade LEFT CAROTID ARTERY: Moderate heterogeneous echogenic plaque formation. No hemodynamically significant left ICA stenosis, velocity elevation, or turbulent flow. Degree of narrowing also less than 50% LEFT VERTEBRAL ARTERY: Retrograde, compatible with subclavian steal phenomenon. IMPRESSION: Moderate bilateral carotid atherosclerosis. No hemodynamically significant ICA stenosis. Degree of narrowing less than 50% bilaterally. 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 Electronically Signed   By: Jerilynn Mages.  Shick M.D.   On: 05/24/2015 13:12   Dg Chest Port 1 View  05/23/2015  CLINICAL DATA:   Acute onset of seizures.  Initial encounter. EXAM: PORTABLE CHEST 1 VIEW COMPARISON:  Chest radiograph performed 07/14/2013 FINDINGS: A small right pleural effusion is noted. Right basilar airspace opacity is seen. The left lung appears relatively clear. No pneumothorax is identified. The cardiomediastinal silhouette is normal in size. No acute osseous abnormalities are seen. IMPRESSION: Small right pleural effusion noted. Right basilar airspace opacity seen. This raises concern for pneumonia, depending on the patient's symptoms. If symptoms are compatible with pneumonia, follow-up PA and lateral chest X-ray is recommended in 3-4 weeks following trial of antibiotic therapy to ensure resolution and exclude underlying malignancy. Electronically Signed   By: Garald Balding M.D.   On: 05/23/2015 23:51     Medications:     . aspirin EC  81 mg Oral Daily  . citalopram  10 mg Oral Daily  . clopidogrel  75 mg Oral Daily  . dialysis solution 1.5% low-MG/low-CA   Intraperitoneal Q24H  . heparin  5,000 Units Subcutaneous 3 times per day  . insulin aspart  0-5 Units Subcutaneous QHS  . insulin aspart  0-9 Units  Subcutaneous TID WC  . levothyroxine  25 mcg Oral QAC breakfast  . metoprolol tartrate  12.5 mg Oral BID  . metroNIDAZOLE  500 mg Oral 3 times per day  . mirtazapine  15 mg Oral QHS  . nicotine  21 mg Transdermal Daily  . pantoprazole  40 mg Oral QAC breakfast  . phenytoin  300 mg Oral QHS  . piperacillin-tazobactam (ZOSYN)  IV  3.375 g Intravenous Q12H  . simvastatin  20 mg Oral Daily  . [START ON 05/26/2015] Vitamin D (Ergocalciferol)  50,000 Units Oral Q30 days   acetaminophen, hydrALAZINE, LORazepam, ondansetron (ZOFRAN) IV, vancomycin  Assessment/ Plan:  59 y.o. Caucasian female with end-stage renal disease on peritoneal dialysis, anemia, secondary hyperparathyroidism, diabetes type 2, malnutrition, history of stroke with left leg weakness, current smoker, presents from home due to  witnessed tonic-clonic seizure, new left-sided weakness   1. ESRD. Chronic  PD . Most recent kt/v 2.02.  CCPD 6 x 2000 cc as outpatient Will schedule PD for tonight  2. HTN  - normally BP is low as outpatient  - noted to be elevated in hospital at presentation. - controlled now - continue to monitor  3. Seizures - neurology evaluation ongoing - dilantin loading   4. AOCKD - Hgb 11.6 - monitor  5. C diff colitis - Treatment as per hospitalist team   LOS: 1 Samil Mecham 12/11/201612:31 PM

## 2015-05-25 NOTE — Progress Notes (Signed)
On call dialysis technician paged 3 times, never received response. Dialysis machine turned off due to excessive beeping causing disturbance to surrounding patients. Oncoming shift will continue to monitor.   Sherri Davidson M

## 2015-05-25 NOTE — Progress Notes (Signed)
[place] [facility] [place] NAME: Sherri Davidson    MR#:  [id number]  DATE OF BIRTH:  June 17, 1955  SUBJECTIVE:  CHIEF COMPLAINT:   Chief Complaint  Patient presents with  . Seizures   alert, but Somewhat confused, no complains, appears very weak. Have some facial twiches.  REVIEW OF SYSTEMS:   Confused. Not able to give history.  ROS  DRUG ALLERGIES:   Allergies  Allergen Reactions  . Asa [Aspirin] Other (See Comments)    GI upset for non-enteric coated aspirin    VITALS:  Blood pressure 129/72, pulse 72, temperature 97.4 F (36.3 C), temperature source Oral, resp. rate 18, height '5\' 5"'$  (1.651 m), weight 59.194 kg (130 lb 8 oz), SpO2 100 %.  PHYSICAL EXAMINATION:  GENERAL:  59 y.o.-year-old patient lying in the bed with no acute distress.  EYES: Pupils equal, round, reactive to light and accommodation. No scleral icterus. Extraocular muscles intact.  HEENT: Head atraumatic, normocephalic. Oropharynx and nasopharynx clear.  NECK:  Supple, no jugular venous distention. No thyroid enlargement, no tenderness.  LUNGS: Normal breath sounds bilaterally, no wheezing, rales,rhonchi or crepitation. No use of accessory muscles of respiration.  CARDIOVASCULAR: S1, S2 normal. No murmurs, rubs, or gallops.  ABDOMEN: Soft, nontender, distended. Bowel sounds present. No organomegaly or mass.  EXTREMITIES: No pedal edema, cyanosis, or clubbing.  NEUROLOGIC: Cranial nerves II through XII are intact. Muscle strength 4/5 in all extremities. Sensation intact. Gait not checked.  PSYCHIATRIC: The patient is alert and somewhat confused.  SKIN: No obvious rash, lesion, or ulcer.   Physical Exam LABORATORY PANEL:   CBC  Recent Labs Lab 05/24/15 0627  WBC 16.0*  HGB 11.6*  HCT 35.4  PLT 276   ------------------------------------------------------------------------------------------------------------------  Chemistries   Recent Labs Lab  05/23/15 2334 05/24/15 0627  NA 138 137  K 4.1 4.1  CL 107 106  CO2 15* 20*  GLUCOSE 214* 284*  BUN 31* 30*  CREATININE 6.43* 6.18*  CALCIUM 8.8* 8.5*  AST 39  --   ALT 17  --   ALKPHOS 124  --   BILITOT 0.3  --    ------------------------------------------------------------------------------------------------------------------  Cardiac Enzymes  Recent Labs Lab 05/23/15 2334  TROPONINI <0.03   ------------------------------------------------------------------------------------------------------------------  RADIOLOGY:  Ct Head Wo Contrast  05/24/2015  CLINICAL DATA:  Acute onset of seizures.  Initial encounter. EXAM: CT HEAD WITHOUT CONTRAST TECHNIQUE: Contiguous axial images were obtained from the base of the skull through the vertex without intravenous contrast. COMPARISON:  CT of the head performed 07/18/2009 FINDINGS: There is no evidence of acute infarction, mass lesion, or intra- or extra-axial hemorrhage on CT. Prominence of the ventricles and sulci reflects mild cortical volume loss. Scattered periventricular and subcortical white matter change likely reflects small vessel ischemic microangiopathy. A chronic infarct is noted at the high right parietal lobe, with associated encephalomalacia and mild ex vacuo dilatation of the right lateral ventricle. The brainstem and fourth ventricle are within normal limits. No mass effect or midline shift is seen. There is no evidence of fracture; visualized osseous structures are unremarkable in appearance. The visualized portions of the orbits are within normal limits. The paranasal sinuses and mastoid air cells are well-aerated. No significant soft tissue abnormalities are seen. IMPRESSION: 1. No acute intracranial pathology seen on CT. 2. Mild cortical volume loss and scattered small vessel ischemic microangiopathy. 3. Chronic infarct at the high right parietal lobe, with associated encephalomalacia and mild ex vacuo dilatation of the  right lateral ventricle. Electronically Signed   By: Garald Balding M.D.   On: 05/24/2015 00:01   Mr Brain Wo Contrast  05/24/2015  CLINICAL DATA:  59 year old female with chronic cerebral infarcts. End-stage renal disease on dialysis. Acute seizure. Initial encounter. EXAM: MRI HEAD WITHOUT CONTRAST TECHNIQUE: Multiplanar, multiecho pulse sequences of the brain and surrounding structures were obtained without intravenous contrast. COMPARISON:  Head CTs without contrast 05/23/2015 and earlier. Report of 11/04/2008 brain MRI (no images available). FINDINGS: Study is intermittently degraded by motion artifact despite repeated imaging attempts. Rapid scanning technique had to be utilized. Chronic encephalomalacia in the posterior right MCA and right PCA territories with volume loss and ex vacuo enlargement of the right lateral ventricle. On diffusion-weighted imaging today there is moderately restricted diffusion throughout the right hippocampal complex (series 100, image 22) associated with mild increased T2 and FLAIR hyperintensity. No associated hemorrhage or mass effect. There is also a small focus of cortical versus sulcal diffusion abnormality in the left occipital pole. See series 100, image 24 and series 101, image 7. No acute hemorrhage identified here on yesterday's head CT. No other evidence on this study of extra-axial blood or collection. No intraventricular hemorrhage. No other restricted diffusion. Major intracranial vascular flow voids are preserved. Patchy and confluent superimposed right greater than left cerebral white matter T2 and FLAIR hyperintensity. No intracranial mass effect. Chronic deep gray matter lacunar infarcts bilaterally including in the left thalamus. No midline shift, mass effect, evidence of mass lesion, ventriculomegaly. Cervicomedullary junction and pituitary are within normal limits. Grossly negative visualized cervical spine. Mastoids and paranasal sinuses are clear.  Negative orbit and scalp soft tissues. IMPRESSION: 1. Abnormal right hippocampus with restricted diffusion and T2 hyperintensity. In light of clinical history favor this reflects acute seizure/status epilepticus. 2. Superimposed small left occipital pole focus of diffusion abnormality is most compatible with small Acute left PCA territory Infarct. 3. No associated hemorrhage or intracranial mass effect. 4. Underlying advanced right MCA and PCA chronic ischemia with encephalomalacia. Superimposed bilateral deep gray matter chronic small vessel disease. Electronically Signed   By: Genevie Ann M.D.   On: 05/24/2015 11:40   US Carotid Bilateral  05/24/2015  CLINICAL DATA:  STROKE SYMPTOMS, HYPERLIPIDEMIA, DIABETES AND SMOKING HISTORY. EXAM: BILATERAL CAROTID DUPLEX ULTRASOUND TECHNIQUE: Pearline Cables scale imaging, color Doppler and duplex ultrasound were performed of bilateral carotid and vertebral arteries in the neck. COMPARISON:  05/24/2015 MRI FINDINGS: Criteria: Quantification of carotid stenosis is based on velocity parameters that correlate the residual internal carotid diameter with NASCET-based stenosis levels, using the diameter of the distal internal carotid lumen as the denominator for stenosis measurement. The following velocity measurements were obtained: RIGHT ICA:  57/12 cm/sec CCA:  56/3 cm/sec SYSTOLIC ICA/CCA RATIO:  0.7 DIASTOLIC ICA/CCA RATIO:  1.9 ECA:  230 cm/sec LEFT ICA:  88/15 cm/sec CCA:  14/9 cm/sec SYSTOLIC ICA/CCA RATIO:  1.0 DIASTOLIC ICA/CCA RATIO:  1.5 ECA:  109 cm/sec RIGHT CAROTID ARTERY: Moderate echogenic shadowing plaque formation. No hemodynamically significant right ICA stenosis, velocity elevation, or turbulent flow. Degree of narrowing less than 50%. RIGHT VERTEBRAL ARTERY:  Antegrade LEFT CAROTID ARTERY: Moderate heterogeneous echogenic plaque formation. No hemodynamically significant left ICA stenosis, velocity elevation, or turbulent flow. Degree of narrowing also less than 50% LEFT  VERTEBRAL ARTERY: Retrograde, compatible with subclavian steal phenomenon. IMPRESSION: Moderate bilateral carotid atherosclerosis. No hemodynamically significant ICA stenosis. Degree of narrowing less than 50% bilaterally. 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  Electronically Signed   By: Jerilynn Mages.  Shick M.D.   On: 05/24/2015 13:12   Dg Chest Port 1 View  05/23/2015  CLINICAL DATA:  Acute onset of seizures.  Initial encounter. EXAM: PORTABLE CHEST 1 VIEW COMPARISON:  Chest radiograph performed 07/14/2013 FINDINGS: A small right pleural effusion is noted. Right basilar airspace opacity is seen. The left lung appears relatively clear. No pneumothorax is identified. The cardiomediastinal silhouette is normal in size. No acute osseous abnormalities are seen. IMPRESSION: Small right pleural effusion noted. Right basilar airspace opacity seen. This raises concern for pneumonia, depending on the patient's symptoms. If symptoms are compatible with pneumonia, follow-up PA and lateral chest X-ray is recommended in 3-4 weeks following trial of antibiotic therapy to ensure resolution and exclude underlying malignancy. Electronically Signed   By: Garald Balding M.D.   On: 05/23/2015 23:51    ASSESSMENT AND PLAN:   Principal Problem:   Community acquired pneumonia Active Problems:   Seizure ([place])   Pressure ulcer  * Seizures   Loaded with dilantin.    Neurology consult.    CT and MRI negative for any other findings.    Corrected level of Dilantin is in range- so spoke to neurologist- suggested to add keppra.  * Pneumonia   On vanc + zosyn   Follow cultures.  * UTI   On zosyn- ur cx sent.  * C diff colitis   On oral flagyl.   Isolation.  * ESRD- on peritoneal dialysis   Appreciated nephrology help.  * DM- ISS  * Htn- Metoprolol.    All the records are reviewed and case discussed with Care Management/Social Workerr. Management plans  discussed with the patient, family and they are in agreement.  CODE STATUS: FUll.  TOTAL TIME TAKING CARE OF THIS PATIENT: 35 minutes.     POSSIBLE D/C IN 1-2 DAYS, DEPENDING ON CLINICAL CONDITION.   [name] on 05/25/2015   Between 7am to 6pm - Pager - [phone]  After 6pm go to [email or web address] - password [email or web address]  Office  [phone]  CC: Primary care physician; [facility]  Note: This dictation was prepared with Dragon dictation along with smaller phrase technology. Any transcriptional errors that result from this process are unintentional.

## 2015-05-25 NOTE — Progress Notes (Signed)
[place] [facility] [place] NAME: Sherri Davidson    MR#:  [id number]  DATE OF BIRTH:  04/21/1956  SUBJECTIVE:  CHIEF COMPLAINT:   Chief Complaint  Patient presents with  . Seizures    Somewhat confused, no complains, appears very weak.  REVIEW OF SYSTEMS:   Confused. Not able to give history.  ROS  DRUG ALLERGIES:   Allergies  Allergen Reactions  . Asa [Aspirin] Other (See Comments)    GI upset for non-enteric coated aspirin    VITALS:  Blood pressure 129/72, pulse 72, temperature 97.4 F (36.3 C), temperature source Oral, resp. rate 18, height '5\' 5"'$  (1.651 m), weight 59.194 kg (130 lb 8 oz), SpO2 100 %.  PHYSICAL EXAMINATION:  GENERAL:  59 y.o.-year-old patient lying in the bed with no acute distress.  EYES: Pupils equal, round, reactive to light and accommodation. No scleral icterus. Extraocular muscles intact.  HEENT: Head atraumatic, normocephalic. Oropharynx and nasopharynx clear.  NECK:  Supple, no jugular venous distention. No thyroid enlargement, no tenderness.  LUNGS: Normal breath sounds bilaterally, no wheezing, rales,rhonchi or crepitation. No use of accessory muscles of respiration.  CARDIOVASCULAR: S1, S2 normal. No murmurs, rubs, or gallops.  ABDOMEN: Soft, nontender, distended. Bowel sounds present. No organomegaly or mass.  EXTREMITIES: No pedal edema, cyanosis, or clubbing.  NEUROLOGIC: Cranial nerves II through XII are intact. Muscle strength 4/5 in all extremities. Sensation intact. Gait not checked.  PSYCHIATRIC: The patient is alert and somewhat confused.  SKIN: No obvious rash, lesion, or ulcer.   Physical Exam LABORATORY PANEL:   CBC  Recent Labs Lab 05/24/15 0627  WBC 16.0*  HGB 11.6*  HCT 35.4  PLT 276   ------------------------------------------------------------------------------------------------------------------  Chemistries   Recent Labs Lab 05/23/15 2334 05/24/15 0627  NA 138  137  K 4.1 4.1  CL 107 106  CO2 15* 20*  GLUCOSE 214* 284*  BUN 31* 30*  CREATININE 6.43* 6.18*  CALCIUM 8.8* 8.5*  AST 39  --   ALT 17  --   ALKPHOS 124  --   BILITOT 0.3  --    ------------------------------------------------------------------------------------------------------------------  Cardiac Enzymes  Recent Labs Lab 05/23/15 2334  TROPONINI <0.03   ------------------------------------------------------------------------------------------------------------------  RADIOLOGY:  Ct Head Wo Contrast  05/24/2015  CLINICAL DATA:  Acute onset of seizures.  Initial encounter. EXAM: CT HEAD WITHOUT CONTRAST TECHNIQUE: Contiguous axial images were obtained from the base of the skull through the vertex without intravenous contrast. COMPARISON:  CT of the head performed 07/18/2009 FINDINGS: There is no evidence of acute infarction, mass lesion, or intra- or extra-axial hemorrhage on CT. Prominence of the ventricles and sulci reflects mild cortical volume loss. Scattered periventricular and subcortical white matter change likely reflects small vessel ischemic microangiopathy. A chronic infarct is noted at the high right parietal lobe, with associated encephalomalacia and mild ex vacuo dilatation of the right lateral ventricle. The brainstem and fourth ventricle are within normal limits. No mass effect or midline shift is seen. There is no evidence of fracture; visualized osseous structures are unremarkable in appearance. The visualized portions of the orbits are within normal limits. The paranasal sinuses and mastoid air cells are well-aerated. No significant soft tissue abnormalities are seen. IMPRESSION: 1. No acute intracranial pathology seen on CT. 2. Mild cortical volume loss and scattered small vessel ischemic microangiopathy. 3. Chronic infarct at the high right parietal lobe, with associated encephalomalacia and mild ex vacuo dilatation of the right lateral ventricle. Electronically  Signed   By: Garald Balding M.D.   On: 05/24/2015 00:01   Mr Brain Wo Contrast  05/24/2015  CLINICAL DATA:  59 year old female with chronic cerebral infarcts. End-stage renal disease on dialysis. Acute seizure. Initial encounter. EXAM: MRI HEAD WITHOUT CONTRAST TECHNIQUE: Multiplanar, multiecho pulse sequences of the brain and surrounding structures were obtained without intravenous contrast. COMPARISON:  Head CTs without contrast 05/23/2015 and earlier. Report of 11/04/2008 brain MRI (no images available). FINDINGS: Study is intermittently degraded by motion artifact despite repeated imaging attempts. Rapid scanning technique had to be utilized. Chronic encephalomalacia in the posterior right MCA and right PCA territories with volume loss and ex vacuo enlargement of the right lateral ventricle. On diffusion-weighted imaging today there is moderately restricted diffusion throughout the right hippocampal complex (series 100, image 22) associated with mild increased T2 and FLAIR hyperintensity. No associated hemorrhage or mass effect. There is also a small focus of cortical versus sulcal diffusion abnormality in the left occipital pole. See series 100, image 24 and series 101, image 7. No acute hemorrhage identified here on yesterday's head CT. No other evidence on this study of extra-axial blood or collection. No intraventricular hemorrhage. No other restricted diffusion. Major intracranial vascular flow voids are preserved. Patchy and confluent superimposed right greater than left cerebral white matter T2 and FLAIR hyperintensity. No intracranial mass effect. Chronic deep gray matter lacunar infarcts bilaterally including in the left thalamus. No midline shift, mass effect, evidence of mass lesion, ventriculomegaly. Cervicomedullary junction and pituitary are within normal limits. Grossly negative visualized cervical spine. Mastoids and paranasal sinuses are clear. Negative orbit and scalp soft tissues.  IMPRESSION: 1. Abnormal right hippocampus with restricted diffusion and T2 hyperintensity. In light of clinical history favor this reflects acute seizure/status epilepticus. 2. Superimposed small left occipital pole focus of diffusion abnormality is most compatible with small Acute left PCA territory Infarct. 3. No associated hemorrhage or intracranial mass effect. 4. Underlying advanced right MCA and PCA chronic ischemia with encephalomalacia. Superimposed bilateral deep gray matter chronic small vessel disease. Electronically Signed   By: Genevie Ann M.D.   On: 05/24/2015 11:40   US Carotid Bilateral  05/24/2015  CLINICAL DATA:  STROKE SYMPTOMS, HYPERLIPIDEMIA, DIABETES AND SMOKING HISTORY. EXAM: BILATERAL CAROTID DUPLEX ULTRASOUND TECHNIQUE: Pearline Cables scale imaging, color Doppler and duplex ultrasound were performed of bilateral carotid and vertebral arteries in the neck. COMPARISON:  05/24/2015 MRI FINDINGS: Criteria: Quantification of carotid stenosis is based on velocity parameters that correlate the residual internal carotid diameter with NASCET-based stenosis levels, using the diameter of the distal internal carotid lumen as the denominator for stenosis measurement. The following velocity measurements were obtained: RIGHT ICA:  57/12 cm/sec CCA:  02/7 cm/sec SYSTOLIC ICA/CCA RATIO:  0.7 DIASTOLIC ICA/CCA RATIO:  1.9 ECA:  230 cm/sec LEFT ICA:  88/15 cm/sec CCA:  25/3 cm/sec SYSTOLIC ICA/CCA RATIO:  1.0 DIASTOLIC ICA/CCA RATIO:  1.5 ECA:  109 cm/sec RIGHT CAROTID ARTERY: Moderate echogenic shadowing plaque formation. No hemodynamically significant right ICA stenosis, velocity elevation, or turbulent flow. Degree of narrowing less than 50%. RIGHT VERTEBRAL ARTERY:  Antegrade LEFT CAROTID ARTERY: Moderate heterogeneous echogenic plaque formation. No hemodynamically significant left ICA stenosis, velocity elevation, or turbulent flow. Degree of narrowing also less than 50% LEFT VERTEBRAL ARTERY: Retrograde,  compatible with subclavian steal phenomenon. IMPRESSION: Moderate bilateral carotid atherosclerosis. No hemodynamically significant ICA stenosis. Degree of narrowing less than 50% bilaterally. 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 Electronically Signed  By: Eugenie Filler M.D.   On: 05/24/2015 13:12   Dg Chest Port 1 View  05/23/2015  CLINICAL DATA:  Acute onset of seizures.  Initial encounter. EXAM: PORTABLE CHEST 1 VIEW COMPARISON:  Chest radiograph performed 07/14/2013 FINDINGS: A small right pleural effusion is noted. Right basilar airspace opacity is seen. The left lung appears relatively clear. No pneumothorax is identified. The cardiomediastinal silhouette is normal in size. No acute osseous abnormalities are seen. IMPRESSION: Small right pleural effusion noted. Right basilar airspace opacity seen. This raises concern for pneumonia, depending on the patient's symptoms. If symptoms are compatible with pneumonia, follow-up PA and lateral chest X-ray is recommended in 3-4 weeks following trial of antibiotic therapy to ensure resolution and exclude underlying malignancy. Electronically Signed   By: Garald Balding M.D.   On: 05/23/2015 23:51    ASSESSMENT AND PLAN:   Principal Problem:   Community acquired pneumonia Active Problems:   Seizure ([place])   Pressure ulcer  * Seizures   Loaded with dilantin.    Neurology consult.    CT and MRI negative for any other findings.  * Pneumonia   On vanc + zosyn   Follow cultures.  * UTI   On zosyn- ur cx sent.  * C diff colitis   On oral flagyl.   Isolation.  * DM- ISS  * Htn- Metoprolol.    All the records are reviewed and case discussed with Care Management/Social Workerr. Management plans discussed with the patient, family and they are in agreement.  CODE STATUS: FUll.  TOTAL TIME TAKING CARE OF THIS PATIENT: 35 minutes.     POSSIBLE D/C IN 1-2 DAYS, DEPENDING ON  CLINICAL CONDITION.   [name] on 05/25/2015   Between 7am to 6pm - Pager - [phone]  After 6pm go to [email or web address] - password [email or web address] [facility]  Office  [phone]  CC: Primary care physician; [facility]  Note: This dictation was prepared with Dragon dictation along with smaller phrase technology. Any transcriptional errors that result from this process are unintentional.

## 2015-05-26 ENCOUNTER — Inpatient Hospital Stay: Payer: Medicare (Managed Care)

## 2015-05-26 LAB — CBC
HEMATOCRIT: 32.9 % — AB (ref 35.0–47.0)
HEMOGLOBIN: 11.1 g/dL — AB (ref 12.0–16.0)
MCH: 31 pg (ref 26.0–34.0)
MCHC: 33.8 g/dL (ref 32.0–36.0)
MCV: 91.8 fL (ref 80.0–100.0)
Platelets: 313 10*3/uL (ref 150–440)
RBC: 3.59 MIL/uL — ABNORMAL LOW (ref 3.80–5.20)
RDW: 13.7 % (ref 11.5–14.5)
WBC: 13.1 10*3/uL — ABNORMAL HIGH (ref 3.6–11.0)

## 2015-05-26 LAB — BASIC METABOLIC PANEL
ANION GAP: 8 (ref 5–15)
BUN: 26 mg/dL — ABNORMAL HIGH (ref 6–20)
CHLORIDE: 96 mmol/L — AB (ref 101–111)
CO2: 26 mmol/L (ref 22–32)
CREATININE: 4.65 mg/dL — AB (ref 0.44–1.00)
Calcium: 8.3 mg/dL — ABNORMAL LOW (ref 8.9–10.3)
GFR calc non Af Amer: 9 mL/min — ABNORMAL LOW (ref >60)
GFR, EST AFRICAN AMERICAN: 11 mL/min — AB (ref >60)
Glucose, Bld: 146 mg/dL — ABNORMAL HIGH (ref 65–99)
POTASSIUM: 2.7 mmol/L — AB (ref 3.5–5.1)
Sodium: 130 mmol/L — ABNORMAL LOW (ref 135–145)

## 2015-05-26 LAB — GLUCOSE, CAPILLARY
GLUCOSE-CAPILLARY: 126 mg/dL — AB (ref 65–99)
GLUCOSE-CAPILLARY: 116 mg/dL — AB (ref 65–99)
GLUCOSE-CAPILLARY: 142 mg/dL — AB (ref 65–99)
Glucose-Capillary: 108 mg/dL — ABNORMAL HIGH (ref 65–99)
Glucose-Capillary: 95 mg/dL (ref 65–99)

## 2015-05-26 LAB — URINE CULTURE
CULTURE: NO GROWTH
Special Requests: NORMAL

## 2015-05-26 LAB — POTASSIUM: POTASSIUM: 3.1 mmol/L — AB (ref 3.5–5.1)

## 2015-05-26 LAB — MAGNESIUM: MAGNESIUM: 1.7 mg/dL (ref 1.7–2.4)

## 2015-05-26 MED ORDER — MAGNESIUM SULFATE 2 GM/50ML IV SOLN
2.0000 g | Freq: Once | INTRAVENOUS | Status: AC
  Administered 2015-05-26: 2 g via INTRAVENOUS
  Filled 2015-05-26: qty 50

## 2015-05-26 MED ORDER — POTASSIUM CHLORIDE 20 MEQ PO PACK
40.0000 meq | PACK | Freq: Two times a day (BID) | ORAL | Status: AC
  Administered 2015-05-26 (×2): 40 meq via ORAL
  Filled 2015-05-26 (×2): qty 2

## 2015-05-26 MED ORDER — VANCOMYCIN 50 MG/ML ORAL SOLUTION
125.0000 mg | Freq: Four times a day (QID) | ORAL | Status: DC
  Administered 2015-05-26 – 2015-05-28 (×8): 125 mg via ORAL
  Filled 2015-05-26 (×11): qty 2.5

## 2015-05-26 MED ORDER — POTASSIUM CHLORIDE 20 MEQ PO PACK: 40.0000 meq | PACK | Freq: Two times a day (BID) | ORAL | Status: DC

## 2015-05-26 MED ORDER — POTASSIUM CHLORIDE CRYS ER 20 MEQ PO TBCR
40.0000 meq | EXTENDED_RELEASE_TABLET | Freq: Once | ORAL | Status: AC
  Administered 2015-05-26: 40 meq via ORAL
  Filled 2015-05-26: qty 2

## 2015-05-26 NOTE — Progress Notes (Signed)
[place] at [place] NAME: Sherri Davidson    MR#:  [id number]  DATE OF BIRTH:  Apr 20, 1956  SUBJECTIVE:  CHIEF COMPLAINT:   Chief Complaint  Patient presents with  . Seizures   alert, but Somewhat confused, no complains, appears very weak. Still have loose stools frequently.  REVIEW OF SYSTEMS:   Confused. Not able to give history.  ROS  DRUG ALLERGIES:   Allergies  Allergen Reactions  . Asa [Aspirin] Other (See Comments)    GI upset for non-enteric coated aspirin    VITALS:  Blood pressure 149/69, pulse 108, temperature 98.7 F (37.1 C), temperature source Oral, resp. rate 22, height '5\' 5"'$  (1.651 m), weight 58.741 kg (129 lb 8 oz), SpO2 98 %.  PHYSICAL EXAMINATION:  GENERAL:  59 y.o.-year-old patient lying in the bed with no acute distress.  EYES: Pupils equal, round, reactive to light and accommodation. No scleral icterus. Extraocular muscles intact.  HEENT: Head atraumatic, normocephalic. Oropharynx and nasopharynx clear.  NECK:  Supple, no jugular venous distention. No thyroid enlargement, no tenderness.  LUNGS: Normal breath sounds bilaterally, no wheezing, rales,rhonchi or crepitation. No use of accessory muscles of respiration.  CARDIOVASCULAR: S1, S2 normal. No murmurs, rubs, or gallops.  ABDOMEN: Soft, nontender, distended. Bowel sounds present. No organomegaly or mass.  EXTREMITIES: No pedal edema, cyanosis, or clubbing.  NEUROLOGIC: Cranial nerves II through XII are intact. Muscle strength 4/5 in all extremities. Sensation intact. Gait not checked.  PSYCHIATRIC: The patient is alert and somewhat confused.  SKIN: No obvious rash, lesion, or ulcer.   Physical Exam LABORATORY PANEL:   CBC  Recent Labs Lab 05/26/15 0515  WBC 13.1*  HGB 11.1*  HCT 32.9*  PLT 313   ------------------------------------------------------------------------------------------------------------------  Chemistries   Recent  Labs Lab 05/23/15 2334  05/26/15 0515 05/26/15 1227  NA 138  < > 130*  --   K 4.1  < > 2.7* 3.1*  CL 107  < > 96*  --   CO2 15*  < > 26  --   GLUCOSE 214*  < > 146*  --   BUN 31*  < > 26*  --   CREATININE 6.43*  < > 4.65*  --   CALCIUM 8.8*  < > 8.3*  --   MG  --   --   --  1.7  AST 39  --   --   --   ALT 17  --   --   --   ALKPHOS 124  --   --   --   BILITOT 0.3  --   --   --   < > = values in this interval not displayed. ------------------------------------------------------------------------------------------------------------------  Cardiac Enzymes  Recent Labs Lab 05/23/15 2334  TROPONINI <0.03   ------------------------------------------------------------------------------------------------------------------  RADIOLOGY:  Dg Chest 2 View  05/26/2015  CLINICAL DATA:  Cough and congestion. EXAM: CHEST  2 VIEW COMPARISON:  05/23/2015. FINDINGS: Trachea is midline. Heart size is accentuated by AP technique and apical lordotic view. Small right pleural effusion with associated mild right basilar airspace opacification, stable. Left lung is clear. IMPRESSION: Small right pleural effusion and right basilar airspace opacification, stable. Electronically Signed   By: Lorin Picket M.D.   On: 05/26/2015 11:22    ASSESSMENT AND PLAN:   Principal Problem:   Community acquired pneumonia Active Problems:   Seizure ([place])   Pressure ulcer  * Seizures   Loaded with dilantin.    Neurology  consult.    CT and MRI negative for any other findings.    Corrected level of Dilantin is in range- so spoke to neurologist- suggested to add keppra.   Will speak to neurologist for final recommendation of medications.  * Pneumonia   On vanc + zosyn- changed to rocephin IV for UTI.   Follow cultures.  * UTI   Was On zosyn- ur cx sent.   Now on rocephin- wait for final cx.  * C diff colitis   On oral flagyl.   Isolation.    As flagyl decrease seizure threshold- changed to oral  vancomycin.  * ESRD- on peritoneal dialysis   Appreciated nephrology help.  * DM- ISS  * Htn- Metoprolol.  All the records are reviewed and case discussed with Care Management/Social Workerr. Management plans discussed with the patient, family and they are in agreement.  CODE STATUS: FUll.  TOTAL TIME TAKING CARE OF THIS PATIENT: 35 minutes.   POSSIBLE D/C IN 1-2 DAYS, DEPENDING ON CLINICAL CONDITION.   [name] on 05/26/2015   Between 7am to 6pm - Pager - [phone]  After 6pm go to [email or web address] - password [email or web address] [facility]  Office  [phone]  CC: Primary care physician; [facility]  Note: This dictation was prepared with Dragon dictation along with smaller phrase technology. Any transcriptional errors that result from this process are unintentional.

## 2015-05-26 NOTE — Progress Notes (Signed)
Subjective:  Patient had peritoneal dialysis last night. Currently appears to be doing better. Potassium up to 3.1 this a.m.   Objective:  Vital signs in last 24 hours:  Temp:  [97.6 F (36.4 C)-98 F (36.7 C)] 97.6 F (36.4 C) (12/12 0412) Pulse Rate:  [59-78] 59 (12/12 1204) Resp:  [18] 18 (12/12 1204) BP: (70-124)/(39-98) 120/93 mmHg (12/12 1204) SpO2:  [77 %-98 %] 77 % (12/12 1204) Weight:  [58.741 kg (129 lb 8 oz)] 58.741 kg (129 lb 8 oz) (12/12 0412)  Weight change:  Filed Weights   05/23/15 2331 05/24/15 0354 05/26/15 0412  Weight: 70.308 kg (155 lb) 59.194 kg (130 lb 8 oz) 58.741 kg (129 lb 8 oz)    Intake/Output:    Intake/Output Summary (Last 24 hours) at 05/26/15 1557 Last data filed at 05/26/15 0915  Gross per 24 hour  Intake      0 ml  Output   1824 ml  Net  -1824 ml     Physical Exam: General:  chronically ill-appearing   HEENT  moist mucous membranes   Neck  supple   Pulm/lungs  clear to auscultation bilaterally, normal effort   CVS/Heart  irregular rhythm   Abdomen:   soft, mildly distended, BS present  Extremities:  no peripheral edema   Neurologic:  alert, following commands   Skin:  chronic excoriation lesions on b/l LE's  Access:  PD catheter        Basic Metabolic Panel:   Recent Labs Lab 05/23/15 2334 05/24/15 0627 05/26/15 0515 05/26/15 1227  NA 138 137 130*  --   K 4.1 4.1 2.7* 3.1*  CL 107 106 96*  --   CO2 15* 20* 26  --   GLUCOSE 214* 284* 146*  --   BUN 31* 30* 26*  --   CREATININE 6.43* 6.18* 4.65*  --   CALCIUM 8.8* 8.5* 8.3*  --   MG  --   --   --  1.7     CBC:  Recent Labs Lab 05/23/15 2334 05/24/15 0627 05/26/15 0515  WBC 20.5* 16.0* 13.1*  NEUTROABS 16.2* 14.2*  --   HGB 12.1 11.6* 11.1*  HCT 37.6 35.4 32.9*  MCV 94.5 93.4 91.8  PLT 401 276 313      Microbiology:  Recent Results (from the past 720 hour(s))  Culture, blood (routine x 2)     Status: None (Preliminary result)   Collection  Time: 05/23/15 11:34 PM  Result Value Ref Range Status   Specimen Description BLOOD RIGHT HAND  Final   Special Requests BOTTLES DRAWN AEROBIC AND ANAEROBIC 4CC  Final   Culture NO GROWTH 2 DAYS  Final   Report Status PENDING  Incomplete  Culture, blood (routine x 2)     Status: None (Preliminary result)   Collection Time: 05/24/15 12:59 AM  Result Value Ref Range Status   Specimen Description BLOOD  Final   Special Requests BOTTLES DRAWN AEROBIC AND ANAEROBIC 4CC  Final   Culture NO GROWTH 2 DAYS  Final   Report Status PENDING  Incomplete  Urine culture     Status: None   Collection Time: 05/24/15  1:10 AM  Result Value Ref Range Status   Specimen Description URINE, RANDOM  Final   Special Requests Normal  Final   Culture NO GROWTH 2 DAYS  Final   Report Status 05/26/2015 FINAL  Final  C difficile quick scan w PCR reflex     Status: Abnormal  Collection Time: 05/24/15  4:37 AM  Result Value Ref Range Status   C Diff antigen POSITIVE (A) NEGATIVE Final   C Diff toxin NEGATIVE NEGATIVE Final   C Diff interpretation   Final    Positive for toxigenic C. difficile, active toxin production not detected. Patient has toxigenic C. difficile organisms present in the bowel, but toxin was not detected. The patient may be a carrier or the level of toxin in the sample was below the limit  of detection. This information should be used in conjunction with the patient's clinical history when deciding on possible therapy.   Clostridium Difficile by PCR     Status: Abnormal   Collection Time: 05/24/15  4:37 AM  Result Value Ref Range Status   Toxigenic C Difficile by pcr POSITIVE (A) NEGATIVE Final    Comment: CRITICAL RESULT CALLED TO, READ BACK BY AND VERIFIED WITH: MARCEL TURNER ON 05/24/15 AT 0643 BY QSD     Coagulation Studies:  Recent Labs  05/23/15 2334  LABPROT 14.4  INR 1.10    Urinalysis:  Recent Labs  05/24/15 0110  COLORURINE YELLOW*  LABSPEC 1.011  PHURINE 7.0   GLUCOSEU NEGATIVE  HGBUR 1+*  BILIRUBINUR NEGATIVE  KETONESUR NEGATIVE  PROTEINUR 100*  NITRITE NEGATIVE  LEUKOCYTESUR 2+*      Imaging: Dg Chest 2 View  05/26/2015  CLINICAL DATA:  Cough and congestion. EXAM: CHEST  2 VIEW COMPARISON:  05/23/2015. FINDINGS: Trachea is midline. Heart size is accentuated by AP technique and apical lordotic view. Small right pleural effusion with associated mild right basilar airspace opacification, stable. Left lung is clear. IMPRESSION: Small right pleural effusion and right basilar airspace opacification, stable. Electronically Signed   By: Lorin Picket M.D.   On: 05/26/2015 11:22     Medications:     . aspirin EC  81 mg Oral Daily  . cefTRIAXone (ROCEPHIN)  IV  1 g Intravenous Q24H  . citalopram  10 mg Oral Daily  . clopidogrel  75 mg Oral Daily  . dialysis solution 1.5% low-MG/low-CA   Intraperitoneal Q24H  . dialysis solution 1.5% low-MG/low-CA   Intraperitoneal Q24H  . heparin  5,000 Units Subcutaneous 3 times per day  . insulin aspart  0-5 Units Subcutaneous QHS  . insulin aspart  0-9 Units Subcutaneous TID WC  . levETIRAcetam  500 mg Intravenous Q12H  . levothyroxine  25 mcg Oral QAC breakfast  . mirtazapine  15 mg Oral QHS  . nicotine  21 mg Transdermal Daily  . pantoprazole  40 mg Oral QAC breakfast  . phenytoin  300 mg Oral QHS  . potassium chloride  40 mEq Oral BID  . simvastatin  20 mg Oral Daily  . vancomycin  125 mg Oral 4 times per day  . Vitamin D (Ergocalciferol)  50,000 Units Oral Q30 days   acetaminophen, hydrALAZINE, LORazepam, ondansetron (ZOFRAN) IV  Assessment/ Plan:  59 y.o. Caucasian female with end-stage renal disease on peritoneal dialysis, anemia, secondary hyperparathyroidism, diabetes type 2, malnutrition, history of stroke with left leg weakness, current smoker, presents from home due to witnessed tonic-clonic seizure, new left-sided weakness   1. ESRD. Chronic  PD . Most recent kt/v 2.02.  CCPD 6 x  2000 cc as outpatient -  Continue current CCPD prescription.  Pt appears to be tolerating PD well.    2. HTN  - Blood pressure currently 120/93, not on any antihypertensives at the moment.  3. Seizures - MRI findings noted, currently on aspirin  $'81mg'Z$  po daily as CVA maybe the the cause.   4. AOCKD - Hgb 11.1, would hold epogen at this point in time given seizures/CVA.   5. C diff colitis - Treatment as per hospitalist team   LOS: 2 Drayke Grabel 12/12/20163:57 PM

## 2015-05-26 NOTE — Progress Notes (Signed)
Pt's potassium level 2.7 this morning. MD Dr. Reece Levy notified. Orders given for potassium chloride 66mq PO now, with potassium level recheck in four hours. RN will administer medication and continue to monitor. CRachael Fee RN

## 2015-05-26 NOTE — Progress Notes (Signed)
Iso for c diff. Worked with PT and did not tolerate it well. Pt reports no pain but did report nausea. High fall risk. Takes meds ok. Room air. NSR. Pt has no further concerns at this time.

## 2015-05-26 NOTE — Evaluation (Signed)
Physical Therapy Evaluation Patient Details Name: Sherri Davidson MRN: [id number] DOB: [birth year] Today's Date: 05/26/2015   History of Present Illness  Patient is a 59 y/o female that presents after an acute seizure and small acute L PCA infarct. On this admission she has had a peritoneal port placed for dialysis. 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   Clinical Impression  Patient provides limited history, but from what PT could gather patient is significantly below her baseline in bed mobility, sitting balance, and OOB mobility. Patient demonstrates poor balance throughout this session and appears to have a baseline level of confusion and or slurred speech. She will require significant rehabilitation prior to returning to her home environment where she states she was using a RW. Patient does have support at home and provides good effort throughout session today. She demonstrates ataxic motor patterns and either spasticity or possibly limited tone in standing as she locks her LEs into extension on standing. Patient appears to be a good candidate for inpatient rehab given her multi-disciplinary set of needs/deficits and willingness to participate in therapy.     Follow Up Recommendations CIR    Equipment Recommendations       Recommendations for Other Services       Precautions / Restrictions Precautions Precautions: Fall Restrictions Weight Bearing Restrictions: No      Mobility  Bed Mobility Overal bed mobility: Needs Assistance Bed Mobility: Supine to Sit;Sit to Supine     Supine to sit: Mod assist;Max assist Sit to supine: Mod assist;Max assist   General bed mobility comments: Patient demonstrates need for heavy assistance due to poor limb and trunk control and poor sitting balance.   Transfers Overall transfer level: Needs assistance Equipment used: Rolling walker (2 wheeled) Transfers: Sit to/from Stand Sit to Stand: Mod assist;Max assist          General transfer comment: Patient almost instantly locks her knees into extension, almost like spasticity. Heavy posterior lean and very off balance.   Ambulation/Gait             General Gait Details: Deferred   Stairs            Wheelchair Mobility    Modified Rankin (Stroke Patients Only)       Balance Overall balance assessment: Needs assistance Sitting-balance support: Bilateral upper extremity supported Sitting balance-Leahy Scale: Poor Sitting balance - Comments: Patient feels off balance and appears to be falling to her R side, which was minimized somewhat with 2 pillows for support.  Postural control: Right lateral lean;Posterior lean   Standing balance-Leahy Scale: Zero Standing balance comment: Patient instantly goes into bilateral knee extension, almost spastic appearance with heavy posterior lean, very unsafe for standing or any attempts at mobility.                              Pertinent Vitals/Pain Pain Assessment:  (Patient does not report of any pain, nor provide any appearance of discomfort. )    Home Living Family/patient expects to be discharged to:: Skilled nursing facility Living Arrangements:  (Lives with sister)                    Prior Function Level of Independence: Independent with assistive device(s)         Comments: Patient is a poor historian, and claims no falls with RW in the past 6 months.      Hand  Dominance        Extremity/Trunk Assessment   Upper Extremity Assessment: Generalized weakness;LUE deficits/detail       LUE Deficits / Details: Unable to complete full range AROM flexion of the shoulder from previous CVA   Lower Extremity Assessment: Generalized weakness (Difficult to fully assess due to poor sitting balance.)      Cervical / Trunk Assessment: Kyphotic  Communication   Communication: Expressive difficulties (Patient mumbles and intermittently provides garbled speech.)   Cognition Arousal/Alertness: Awake/alert Behavior During Therapy: Restless;Flat affect;Impulsive Overall Cognitive Status: Difficult to assess                      General Comments General comments (skin integrity, edema, etc.): Cuts, and open wounds on bilateral LEs and UEs.     Exercises Other Exercises Other Exercises: Finger nose finger intermittent ataxia and inability to locate both her nose and finger bilaterally L worse than R.       Assessment/Plan    PT Assessment Patient needs continued PT services  PT Diagnosis Difficulty walking;Generalized weakness;Altered mental status;Abnormality of gait   PT Problem List Decreased strength;Decreased knowledge of use of DME;Decreased safety awareness;Decreased knowledge of precautions;Decreased activity tolerance;Decreased balance;Decreased mobility  PT Treatment Interventions DME instruction;Gait training;Stair training;Therapeutic activities;Therapeutic exercise;Balance training   PT Goals (Current goals can be found in the Care Plan section) Acute Rehab PT Goals Patient Stated Goal: To return home  PT Goal Formulation: With patient Time For Goal Achievement: 06/09/15 Potential to Achieve Goals: Fair    Frequency 7X/week   Barriers to discharge        Co-evaluation               End of Session Equipment Utilized During Treatment: Gait belt Activity Tolerance: Patient tolerated treatment well Patient left: in bed;with call bell/phone within reach;with bed alarm set Nurse Communication: Mobility status         Time: 7209-4709 PT Time Calculation (min) (ACUTE ONLY): 21 min   Charges:   PT Evaluation $Initial PT Evaluation Tier I: 1 Procedure     PT G Codes:       Kerman Passey, PT, DPT    05/26/2015, 5:18 PM

## 2015-05-26 NOTE — Care Management Important Message (Signed)
Important Message  Patient Details  Name: Sherri Davidson MRN: [id number] Date of Birth: 30-Nov-1955    Medicare Important Message Given:  Yes  Explained notice to patient's son    Mathis Bud 05/26/2015, 5:36 PM

## 2015-05-26 NOTE — Care Management (Signed)
Lives at home and is followed by [facility].  No home care.services in the home.  She has esrd and home peritoneal dialysis.  PT consult is pending.  Updated Kim with [facility] and it sounds as though patient is below her baseline.

## 2015-05-26 NOTE — Consult Note (Signed)
WOC wound consult note Reason for Consult:Admitted from home following a seizure.  Pneumonia and positive for C-diff Wound type: Moisture associated skin damage from fecal incontinence.  Dry skin and linear abrasions from scratching present to legs and arms.  Pressure Ulcer POA: Yes  MASD and pressure to sacrum and coccyx  Stage 1 nonblanchable redness Measurement: 3 cm x 3.4 cm redness to coccyx and sacrum.  Incontinent of stool.  Is confused and rolling around in bed.   Wound bed:Intact nonblanchable redness Drainage (amount, consistency, odor) none Periwound:Nonintact linear abrasions from scratching dry skin Dressing procedure/placement/frequency:Cleanse skin with soap and water and moisturize daily.  ALlevyn pink foam dressing.  IF patient having multiple stools daily., discontinue silicone border foam and apply CriticAid clear barrier cream twice daily and PRN soilage.  Will not follow at this time.  Please re-consult if needed.  Domenic Moras RN BSN [place] Pager [phone]

## 2015-05-27 DIAGNOSIS — J189 Pneumonia, unspecified organism: Secondary | ICD-10-CM

## 2015-05-27 LAB — BASIC METABOLIC PANEL
ANION GAP: 7 (ref 5–15)
BUN: 26 mg/dL — ABNORMAL HIGH (ref 6–20)
CALCIUM: 8.1 mg/dL — AB (ref 8.9–10.3)
CO2: 24 mmol/L (ref 22–32)
Chloride: 101 mmol/L (ref 101–111)
Creatinine, Ser: 4.31 mg/dL — ABNORMAL HIGH (ref 0.44–1.00)
GFR, EST AFRICAN AMERICAN: 12 mL/min — AB (ref >60)
GFR, EST NON AFRICAN AMERICAN: 10 mL/min — AB (ref >60)
GLUCOSE: 86 mg/dL (ref 65–99)
Potassium: 3.3 mmol/L — ABNORMAL LOW (ref 3.5–5.1)
Sodium: 132 mmol/L — ABNORMAL LOW (ref 135–145)

## 2015-05-27 LAB — GLUCOSE, CAPILLARY
GLUCOSE-CAPILLARY: 114 mg/dL — AB (ref 65–99)
GLUCOSE-CAPILLARY: 91 mg/dL (ref 65–99)
Glucose-Capillary: 100 mg/dL — ABNORMAL HIGH (ref 65–99)
Glucose-Capillary: 108 mg/dL — ABNORMAL HIGH (ref 65–99)

## 2015-05-27 NOTE — Progress Notes (Signed)
Patient was alert and oriented this morning and this evening. Around lunch and the afternoon time patient was very sleepy and her speech was somewhat delayed. Easily arousable to voice. No complaints of pain. Grip on left side is slightly weaker than right, which is baseline per patient's sister. Patient normally uses a trapeze bar at home to get herself out of bed. PT attempted to stand again today and patient was unable to get her feet under her to move forward. Not safe at this time to walk to chair. Sister is comfortable taking her back home with her instead of going to inpatient rehab. Still receiving IV keppra and PO dilantin. Patient and sister were informed today that patient did have a small acute infarct. Care plan added. Will continue to monitor.

## 2015-05-27 NOTE — Progress Notes (Signed)
I met with pt's sister, [name], RN, and P.T. At bedside. We discussed her baseline functional mobility as therapy worked with pt at bedside. [name] states she has spoken with [facility] and they will provide therapy five days per week and will also provide transportation to their facility. [name] states pt is 80% back to her baseline to her and she fills she can manage her care at home with pt going to [facility] five times per week. [name] has cared for pt since 2010 and provides her dialysis and all her care needs 24/7. Sister is requesting trapeze bar to her bed here for that is what she uses at home in her hospital bed.  I have updated RN CM and SW. [phone]

## 2015-05-27 NOTE — Progress Notes (Signed)
Physical Therapy Treatment Patient Details Name: Sherri Davidson MRN: [id number] DOB: Nov 03, 1955 Today's Date: 05/27/2015    History of Present Illness Patient is a 59 y/o female that presents after an acute seizure and small acute L PCA infarct. On this admission she has had a peritoneal port placed for dialysis. 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     PT Comments    Patient's sister and POA is present in this session to provide more insight into patient's background. Patient typically is quite reliant upon trapeze overhead for bed mobility and this appears to be impacting her current performance. She demonstrates improved sitting balance in this session, with prolonged period of cga - supervision level with bilateral hand support. She is unable to stand on multiple attempts with RW without assistance from PT. Patient becomes frustrated with attempts at ambulation, as PT asks for her to side shuffle, she demonstrates L LE weakness as she is unable to bring to her side stepping. Patient does participate throughout session, though she becomes increasingly frustrated. Per the sister patient is followed by [facility] and she feels comfortable managing her sister in her current condition and having [facility] assist with PT needs. This Pryor Curia continues to encourage CIR to increase safety with OOB mobility and generalized strengthening/transfers as patient may be more appropriate for slideboard transfers currently. PT will continue to follow patient.   Follow Up Recommendations  CIR     Equipment Recommendations       Recommendations for Other Services       Precautions / Restrictions Precautions Precautions: Fall Restrictions Weight Bearing Restrictions: No    Mobility  Bed Mobility Overal bed mobility: Needs Assistance Bed Mobility: Supine to Sit;Sit to Supine     Supine to sit: Mod assist Sit to supine: Min assist;Mod assist   General bed mobility comments: Patient demonstrates  improved sitting balance during transfer, though she continues to require assistance to elevate her trunk off of bed surface due to generalized weakness.   Transfers Overall transfer level: Needs assistance Equipment used: Rolling walker (2 wheeled) Transfers: Sit to/from Stand Sit to Stand: Min assist;Mod assist         General transfer comment: Patient requires cuing and assistance with transfer and insists PT needs to step back. Per her sister present she typically has RW anterior to her COM and pushes through her knuckles to elevate herself off the bed. She is unable to complete this independently today.   Ambulation/Gait Ambulation/Gait assistance: Min assist Ambulation Distance (Feet): 2 Feet Assistive device: Rolling walker (2 wheeled)     Gait velocity interpretation: <1.8 ft/sec, indicative of risk for recurrent falls General Gait Details: Attempted ambulation x 3 attempts during this session, patient volitionally? returns to sitting without control during bedside side stepping with RW. She is able to take 3-5 steps on her 3rd attempt.    Stairs            Wheelchair Mobility    Modified Rankin (Stroke Patients Only)       Balance Overall balance assessment: Needs assistance Sitting-balance support: Bilateral upper extremity supported Sitting balance-Leahy Scale: Fair Sitting balance - Comments: Patiet demonstrates improved sitting balance, she is able to maintain herself in upright position without assistance from PT with bilateral UE assistance.    Standing balance support: Bilateral upper extremity supported Standing balance-Leahy Scale: Poor Standing balance comment: Patient stood with PT x 3 attempts and during each attempt patient became frustrated with attempts at ambulation  and lowered herself to the bed surface quickly without warning all three occasions.                     Cognition Arousal/Alertness: Awake/alert Behavior During Therapy:  Restless;Flat affect;Impulsive Overall Cognitive Status: Difficult to assess                      Exercises      General Comments General comments (skin integrity, edema, etc.): Bruising and small cuts/wounds on bilateral UEs and LEs.       Pertinent Vitals/Pain Pain Assessment: No/denies pain (Pt intermittently complains of R arm pain, no rating scale provided, RN aware. )    Home Living                      Prior Function            PT Goals (current goals can now be found in the care plan section) Acute Rehab PT Goals Patient Stated Goal: To return home  PT Goal Formulation: With patient Time For Goal Achievement: 06/09/15 Potential to Achieve Goals: Fair Progress towards PT goals: Progressing toward goals    Frequency  7X/week    PT Plan Current plan remains appropriate    Co-evaluation             End of Session Equipment Utilized During Treatment: Gait belt Activity Tolerance: Treatment limited secondary to agitation Patient left: in bed;with call bell/phone within reach;with bed alarm set;with family/visitor present;with nursing/sitter in room     Time: 1419-1445 PT Time Calculation (min) (ACUTE ONLY): 26 min  Charges:  $Gait Training: 8-22 mins $Therapeutic Activity: 8-22 mins                    G Codes:      Kerman Passey, PT, DPT    05/27/2015, 4:15 PM

## 2015-05-27 NOTE — Consult Note (Signed)
Patient to be evaluated by CIR this afternoon.  Paged Zeylikman to discuss neuro follow up.  Patient will be seen today.  [facility] family and [facility], patient's current mental and functional state is significantly below her baseline.  There is discussion that seizure meds may be causing  her decline in cognitive/functional status

## 2015-05-27 NOTE — Progress Notes (Signed)
[place] [place] NAME: Sherri Davidson    MR#:  [id number]  DATE OF BIRTH:  [birth year]  SUBJECTIVE:  CHIEF COMPLAINT:   Chief Complaint  Patient presents with  . Seizures   alert, but Somewhat confused, no complains, appears very weak. 2-3 episodes of loose stools in last 24 hrs.   Considering rehab options today.  REVIEW OF SYSTEMS:   CONSTITUTIONAL: No fever, fatigue or weakness.  EYES: No blurred or double vision.  EARS, NOSE, AND THROAT: No tinnitus or ear pain.  RESPIRATORY: No cough, shortness of breath, wheezing or hemoptysis.  CARDIOVASCULAR: No chest pain, orthopnea, edema.  GASTROINTESTINAL: no nausea, vomiting, diarrhea , abdominal pain. GENITOURINARY: No dysuria, hematuria.  ENDOCRINE: No polyuria, nocturia,  HEMATOLOGY: No anemia, easy bruising or bleeding SKIN: No rash or lesion. MUSCULOSKELETAL: No joint pain or arthritis.  NEUROLOGIC: No tingling, numbness, positive for generalized weakness.  PSYCHIATRY: No anxiety or depression  ROS  DRUG ALLERGIES:   Allergies  Allergen Reactions  . Asa [Aspirin] Other (See Comments)    GI upset for non-enteric coated aspirin    VITALS:  Blood pressure 124/74, pulse 80, temperature 97.3 F (36.3 C), temperature source Oral, resp. rate 16, height '5\' 5"'$  (1.651 m), weight 58.2 kg (128 lb 4.9 oz), SpO2 95 %.  PHYSICAL EXAMINATION:  GENERAL:  59 y.o.-year-old patient lying in the bed with no acute distress.  EYES: Pupils equal, round, reactive to light and accommodation. No scleral icterus. Extraocular muscles intact.  HEENT: Head atraumatic, normocephalic. Oropharynx and nasopharynx clear.  NECK:  Supple, no jugular venous distention. No thyroid enlargement, no tenderness.  LUNGS: Normal breath sounds bilaterally, no wheezing, rales,rhonchi or crepitation. No use of accessory muscles of respiration.  CARDIOVASCULAR: S1, S2 normal. No murmurs, rubs, or gallops.   ABDOMEN: Soft, nontender, distended. Bowel sounds present. No organomegaly or mass. PD catheter present on abdomen. EXTREMITIES: No pedal edema, cyanosis, or clubbing.  NEUROLOGIC: Cranial nerves II through XII are intact. Muscle strength 4/5 in all extremities. Sensation intact. Gait not checked.  PSYCHIATRIC: The patient is alert and oriented. SKIN: No obvious rash, lesion, or ulcer.   Physical Exam LABORATORY PANEL:   CBC  Recent Labs Lab 05/26/15 0515  WBC 13.1*  HGB 11.1*  HCT 32.9*  PLT 313   ------------------------------------------------------------------------------------------------------------------  Chemistries   Recent Labs Lab 05/23/15 2334  05/26/15 1227 05/27/15 0625  NA 138  < >  --  132*  K 4.1  < > 3.1* 3.3*  CL 107  < >  --  101  CO2 15*  < >  --  24  GLUCOSE 214*  < >  --  86  BUN 31*  < >  --  26*  CREATININE 6.43*  < >  --  4.31*  CALCIUM 8.8*  < >  --  8.1*  MG  --   --  1.7  --   AST 39  --   --   --   ALT 17  --   --   --   ALKPHOS 124  --   --   --   BILITOT 0.3  --   --   --   < > = values in this interval not displayed. ------------------------------------------------------------------------------------------------------------------  Cardiac Enzymes  Recent Labs Lab 05/23/15 2334  TROPONINI <0.03   ------------------------------------------------------------------------------------------------------------------  RADIOLOGY:  Dg Chest 2 View  05/26/2015  CLINICAL DATA:  Cough and congestion. EXAM: CHEST  2 VIEW COMPARISON:  05/23/2015. FINDINGS: Trachea is midline. Heart size is accentuated by AP technique and apical lordotic view. Small right pleural effusion with associated mild right basilar airspace opacification, stable. Left lung is clear. IMPRESSION: Small right pleural effusion and right basilar airspace opacification, stable. Electronically Signed   By: Lorin Picket M.D.   On: 05/26/2015 11:22    ASSESSMENT AND PLAN:    Principal Problem:   Community acquired pneumonia Active Problems:   Seizure ([place])   Pressure ulcer  * Seizures   Loaded with dilantin.   Appreciated Neurology consult.    CT and MRI negative for any other findings. ( as per neurologist MRI does not have acute stroke)    Corrected level of Dilantin is in range- so spoke to neurologist- suggested to add keppra.   Appreciated final recommendation by neurologist.  * Pneumonia   On vanc + zosyn- changed to rocephin IV for UTI.   Negative cultures.  * UTI   Was On zosyn- ur cx sent.   Now on rocephin- Negative final cx.  * C diff colitis   On oral flagyl.   Isolation.   As flagyl decrease seizure threshold- changed to oral vancomycin.  * ESRD- on peritoneal dialysis   Appreciated nephrology help.  * DM- ISS  * Htn- Metoprolol.  * Generalized weakness   Pt was [facility], now may need rehab,   CM and social worker are working on that.  All the records are reviewed and case discussed with Care Management/Social Workerr. Management plans discussed with the patient, family and they are in agreement.  CODE STATUS: FUll.  TOTAL TIME TAKING CARE OF THIS PATIENT: 35 minutes.   POSSIBLE D/C IN 1-2 DAYS, DEPENDING ON CLINICAL CONDITION. Spoke to her sister on phone about the plan.   [name] on 05/27/2015   Between 7am to 6pm - Pager - [phone]  After 6pm go to [email or web address] - password [email or web address] [facility]  Office  [phone]  CC: Primary care physician; [facility]  Note: This dictation was prepared with Dragon dictation along with smaller phrase technology. Any transcriptional errors that result from this process are unintentional.

## 2015-05-27 NOTE — Progress Notes (Signed)
OT Cancellation Note  Patient Details Name: Sherri Davidson MRN: [id number] DOB: 08/10/1955   Cancelled Treatment:    Reason Eval/Treat Not Completed: Other (comment). Patient with Physical Therapy at time of checking on the patient.  Sharon Mt 05/27/2015, 4:38 PM

## 2015-05-27 NOTE — Progress Notes (Signed)
Subjective:  Continues to do well with peritoneal dialysis. Sodium currently up to 132. Potassium currently 3.3.   Objective:  Vital signs in last 24 hours:  Temp:  [97.5 F (36.4 C)-98.7 F (37.1 C)] 97.8 F (36.6 C) (12/13 1127) Pulse Rate:  [59-108] 74 (12/13 1127) Resp:  [16-22] 18 (12/13 1127) BP: (100-149)/(53-93) 100/71 mmHg (12/13 1127) SpO2:  [77 %-98 %] 98 % (12/13 1127) Weight:  [58.2 kg (128 lb 4.9 oz)] 58.2 kg (128 lb 4.9 oz) (12/13 0544)  Weight change: -0.541 kg (-1 lb 3.1 oz) Filed Weights   05/24/15 0354 05/26/15 0412 05/27/15 0544  Weight: 59.194 kg (130 lb 8 oz) 58.741 kg (129 lb 8 oz) 58.2 kg (128 lb 4.9 oz)    Intake/Output:    Intake/Output Summary (Last 24 hours) at 05/27/15 1143 Last data filed at 05/27/15 0915  Gross per 24 hour  Intake    420 ml  Output    801 ml  Net   -381 ml     Physical Exam: General:  chronically ill-appearing   HEENT  moist mucous membranes   Neck  supple   Pulm/lungs  clear to auscultation bilaterally, normal effort   CVS/Heart  irregular rhythm   Abdomen:   soft, mildly distended, BS present  Extremities:  no peripheral edema   Neurologic:  lethargic, but arousable   Skin:  chronic excoriation lesions on b/l LE's  Access:  PD catheter        Basic Metabolic Panel:   Recent Labs Lab 05/23/15 2334 05/24/15 0627 05/26/15 0515 05/26/15 1227 05/27/15 0625  NA 138 137 130*  --  132*  K 4.1 4.1 2.7* 3.1* 3.3*  CL Sherri 106 96*  --  101  CO2 15* 20* 26  --  24  GLUCOSE 214* 284* 146*  --  86  BUN 31* 30* 26*  --  26*  CREATININE 6.43* 6.18* 4.65*  --  4.31*  CALCIUM 8.8* 8.5* 8.3*  --  8.1*  MG  --   --   --  1.7  --      CBC:  Recent Labs Lab 05/23/15 2334 05/24/15 0627 05/26/15 0515  WBC 20.5* 16.0* 13.1*  NEUTROABS 16.2* 14.2*  --   HGB 12.1 11.6* 11.1*  HCT 37.6 35.4 32.9*  MCV 94.5 93.4 91.8  PLT 401 276 313      Microbiology:  Recent Results (from the past 720 hour(s))   Culture, blood (routine x 2)     Status: None (Preliminary result)   Collection Time: 05/23/15 11:34 PM  Result Value Ref Range Status   Specimen Description BLOOD RIGHT HAND  Final   Special Requests BOTTLES DRAWN AEROBIC AND ANAEROBIC 4CC  Final   Culture NO GROWTH 3 DAYS  Final   Report Status PENDING  Incomplete  Culture, blood (routine x 2)     Status: None (Preliminary result)   Collection Time: 05/24/15 12:59 AM  Result Value Ref Range Status   Specimen Description BLOOD  Final   Special Requests BOTTLES DRAWN AEROBIC AND ANAEROBIC 4CC  Final   Culture NO GROWTH 3 DAYS  Final   Report Status PENDING  Incomplete  Urine culture     Status: None   Collection Time: 05/24/15  1:10 AM  Result Value Ref Range Status   Specimen Description URINE, RANDOM  Final   Special Requests Normal  Final   Culture NO GROWTH 2 DAYS  Final   Report Status 05/26/2015 FINAL  Final  C difficile quick scan w PCR reflex     Status: Abnormal   Collection Time: 05/24/15  4:37 AM  Result Value Ref Range Status   C Diff antigen POSITIVE (A) NEGATIVE Final   C Diff toxin NEGATIVE NEGATIVE Final   C Diff interpretation   Final    Positive for toxigenic C. difficile, active toxin production not detected. Patient has toxigenic C. difficile organisms present in the bowel, but toxin was not detected. The patient may be a carrier or the level of toxin in the sample was below the limit  of detection. This information should be used in conjunction with the patient's clinical history when deciding on possible therapy.   Clostridium Difficile by PCR     Status: Abnormal   Collection Time: 05/24/15  4:37 AM  Result Value Ref Range Status   Toxigenic C Difficile by pcr POSITIVE (A) NEGATIVE Final    Comment: CRITICAL RESULT CALLED TO, READ BACK BY AND VERIFIED WITH: MARCEL TURNER ON 05/24/15 AT 0643 BY QSD     Coagulation Studies: No results for input(s): LABPROT, INR in the last 72 hours.  Urinalysis: No  results for input(s): COLORURINE, LABSPEC, PHURINE, GLUCOSEU, HGBUR, BILIRUBINUR, KETONESUR, PROTEINUR, UROBILINOGEN, NITRITE, LEUKOCYTESUR in the last 72 hours.  Invalid input(s): APPERANCEUR    Imaging: Dg Chest 2 View  05/26/2015  CLINICAL DATA:  Cough and congestion. EXAM: CHEST  2 VIEW COMPARISON:  05/23/2015. FINDINGS: Trachea is midline. Heart size is accentuated by AP technique and apical lordotic view. Small right pleural effusion with associated mild right basilar airspace opacification, stable. Left lung is clear. IMPRESSION: Small right pleural effusion and right basilar airspace opacification, stable. Electronically Signed   By: Lorin Picket M.D.   On: 05/26/2015 11:22     Medications:     . aspirin EC  81 mg Oral Daily  . cefTRIAXone (ROCEPHIN)  IV  1 g Intravenous Q24H  . citalopram  10 mg Oral Daily  . clopidogrel  75 mg Oral Daily  . dialysis solution 1.5% low-MG/low-CA   Intraperitoneal Q24H  . dialysis solution 1.5% low-MG/low-CA   Intraperitoneal Q24H  . heparin  5,000 Units Subcutaneous 3 times per day  . insulin aspart  0-5 Units Subcutaneous QHS  . insulin aspart  0-9 Units Subcutaneous TID WC  . levETIRAcetam  500 mg Intravenous Q12H  . levothyroxine  25 mcg Oral QAC breakfast  . mirtazapine  15 mg Oral QHS  . nicotine  21 mg Transdermal Daily  . pantoprazole  40 mg Oral QAC breakfast  . phenytoin  300 mg Oral QHS  . simvastatin  20 mg Oral Daily  . vancomycin  125 mg Oral 4 times per day  . Vitamin D (Ergocalciferol)  50,000 Units Oral Q30 days   acetaminophen, hydrALAZINE, LORazepam, ondansetron (ZOFRAN) IV  Assessment/ Plan:  59 y.o. Caucasian female with end-stage renal disease on peritoneal dialysis, anemia, secondary hyperparathyroidism, diabetes type 2, malnutrition, history of stroke with left leg weakness, current smoker, presents from home due to witnessed tonic-clonic seizure, new left-sided weakness   1. ESRD. Chronic  PD . Most recent  kt/v 2.02.  CCPD 6 x 2000 cc as outpatient -  Doing well with peritoneal dialysis. Continue current prescription.    2. HTN  - Remains normotensive off antihypertensive therapy.  3. Seizures - MRI findings noted, currently on aspirin '81mg'$  po daily as CVA maybe the the cause.   4. AOCKD - hold Epogen now given seizure  activity and CVA.   5. C diff colitis - Treatment as per hospitalist team   LOS: 3 Darbi Chandran 12/13/201611:43 AM

## 2015-05-27 NOTE — Progress Notes (Signed)
Neurology:  59 y/o F. Admitted with seizures, PNA, UTI, C diff colitis.   Mental status waxes through out the day likely metabolic in nature not convinced seizure related.   Called to review MRI: R T2 hyperintensity is due to history of seizure activity. Pt has restricted diffusion on L occipital on on DWI sequence which is only on one cute, unable to see it on ADC sequence of the MRI.  Not convinced true new ischemia but could be artifact vs small PCA emboli.    - Phenytoin level is 9.8 total but albumin is low so corrected level would be acceptable - Continue Keppra 500 BID which can be switched to PO as well as daily phenytoin of 300  Call with questions.  Leotis Pain

## 2015-05-27 NOTE — Care Management (Signed)
Informed that a referral has been made to [facility] Acute Inpatient Rehab.

## 2015-05-27 NOTE — Progress Notes (Addendum)
Nursing supervisor- Estill Batten looked up on call dialysis nurse due to machine malfunction- error message on peritoneal dialysis machine.  Dialysis nurse on call notified-- Festus Barren.  Kenney Houseman said to turn off machine that she would come in and fix it in the morning/ she was not coming in tonight.  AC- Alecia verified with Kenney Houseman that pt would be okay with fluid in all night, Tonya said yes, to turn off machine- do not unhook from patient.  Jessee Avers

## 2015-05-27 NOTE — Progress Notes (Signed)
Pre-PD tx start

## 2015-05-27 NOTE — Care Management (Signed)
Contacted by Ms Rolena Infante from [place] who is patient's care provider at [facility].  Patient's sister wishes for patient to return home.  Patient will have access to PT and OT and "any other services needed" when she is medically stable for discharge.  She does have a hospital bed at home and uses trapeze bar to assist with bed mobility.  Informed that patient cognitive status has greatly improved this afternoon.  She is talking and answering questions.

## 2015-05-27 NOTE — Progress Notes (Addendum)
Rehab Admissions Coordinator Note:  Patient was screened by Cleatrice Burke for appropriateness for an Inpatient Acute Rehab Consult per PT recommendation.  At this time, we are recommending Inpatient Rehab consult order for onsite assessment for potential admission to [facility] acute inpt rehabilitation in [place]. I contacted Dr. Anselm Jungling for order and discussion. I will alert RN CM and SW. I contacted pt's sister, [name], by phone and will meet her at bedside of pt at 1 pm today for assessment of rehab options.  Cleatrice Burke 05/27/2015, 8:28 AM  I can be reached at [phone].

## 2015-05-28 DIAGNOSIS — R569 Unspecified convulsions: Secondary | ICD-10-CM

## 2015-05-28 LAB — HEPATITIS B SURFACE ANTIGEN: Hepatitis B Surface Ag: NEGATIVE

## 2015-05-28 LAB — GLUCOSE, CAPILLARY
GLUCOSE-CAPILLARY: 148 mg/dL — AB (ref 65–99)
Glucose-Capillary: 83 mg/dL (ref 65–99)

## 2015-05-28 MED ORDER — LEVETIRACETAM 500 MG PO TABS: 500.0000 mg | ORAL_TABLET | Freq: Two times a day (BID) | ORAL | Status: AC

## 2015-05-28 MED ORDER — VANCOMYCIN 50 MG/ML ORAL SOLUTION: 125.0000 mg | Freq: Four times a day (QID) | ORAL | Status: AC

## 2015-05-28 MED ORDER — PHENYTOIN SODIUM EXTENDED 300 MG PO CAPS: 300.0000 mg | ORAL_CAPSULE | Freq: Every day | ORAL | Status: DC

## 2015-05-28 MED ORDER — CEFUROXIME AXETIL 250 MG PO TABS: 250.0000 mg | ORAL_TABLET | Freq: Two times a day (BID) | ORAL | Status: AC

## 2015-05-28 MED ORDER — NICOTINE 21 MG/24HR TD PT24: 21.0000 mg | MEDICATED_PATCH | Freq: Every day | TRANSDERMAL | Status: DC

## 2015-05-28 NOTE — Consult Note (Signed)
HPI: Sherri Davidson is an 59 y.o. female F admitted with seizures, PNA, UTI, C diff colitis.  Mental status waxes.   Past Medical History  Diagnosis Date  . CVA, old, hemiparesis ([place])   . Hypertension   . Diabetes ([place])   . ESRD on peritoneal dialysis ([place])   . CHF (congestive heart failure) ([place])   . Hypothyroidism     Past Surgical History  Procedure Laterality Date  . Cholecystectomy    . Amputation toe    . Insertion of dialysis catheter      History reviewed. No pertinent family history.  Social History:  reports that she has been smoking Cigarettes.  She has a 30 pack-year smoking history. She does not have any smokeless tobacco history on file. She reports that she does not drink alcohol or use illicit drugs.  Allergies  Allergen Reactions  . Asa [Aspirin] Other (See Comments)    GI upset for non-enteric coated aspirin     Physical Examination: Blood pressure 119/60, pulse 75, temperature 97.9 F (36.6 C), temperature source Oral, resp. rate 18, height '5\' 5"'$  (1.651 m), weight 129 lb 3 oz (58.6 kg), SpO2 97 %.  Able to tell me name, and hospital No visual field cuts observed.  EOM intact Generalized weakness. Through out   Laboratory Studies:   Basic Metabolic Panel:  Recent Labs Lab 05/23/15 2334 05/24/15 0627 05/26/15 0515 05/26/15 1227 05/27/15 0625  NA 138 137 130*  --  132*  K 4.1 4.1 2.7* 3.1* 3.3*  CL 107 106 96*  --  101  CO2 15* 20* 26  --  24  GLUCOSE 214* 284* 146*  --  86  BUN 31* 30* 26*  --  26*  CREATININE 6.43* 6.18* 4.65*  --  4.31*  CALCIUM 8.8* 8.5* 8.3*  --  8.1*  MG  --   --   --  1.7  --     Liver Function Tests:  Recent Labs Lab 05/23/15 2334  AST 39  ALT 17  ALKPHOS 124  BILITOT 0.3  PROT 7.3  ALBUMIN 2.4*   No results for input(s): LIPASE, AMYLASE in the last 168 hours. No results for input(s): AMMONIA in the last 168 hours.  CBC:  Recent Labs Lab 05/23/15 2334 05/24/15 0627 05/26/15 0515  WBC  20.5* 16.0* 13.1*  NEUTROABS 16.2* 14.2*  --   HGB 12.1 11.6* 11.1*  HCT 37.6 35.4 32.9*  MCV 94.5 93.4 91.8  PLT 401 276 313    Cardiac Enzymes:  Recent Labs Lab 05/23/15 2334  TROPONINI <0.03    BNP: Invalid input(s): POCBNP  CBG:  Recent Labs Lab 05/27/15 1125 05/27/15 1633 05/27/15 2118 05/28/15 0820 05/28/15 1149  GLUCAP 114* 69 108* 10 148*    Microbiology: Results for orders placed or performed during the hospital encounter of 05/23/15  Culture, blood (routine x 2)     Status: None (Preliminary result)   Collection Time: 05/23/15 11:34 PM  Result Value Ref Range Status   Specimen Description BLOOD RIGHT HAND  Final   Special Requests BOTTLES DRAWN AEROBIC AND ANAEROBIC 4CC  Final   Culture NO GROWTH 3 DAYS  Final   Report Status PENDING  Incomplete  Culture, blood (routine x 2)     Status: None (Preliminary result)   Collection Time: 05/24/15 12:59 AM  Result Value Ref Range Status   Specimen Description BLOOD  Final   Special Requests BOTTLES DRAWN AEROBIC AND ANAEROBIC 4CC  Final   Culture NO GROWTH 3 DAYS  Final   Report Status PENDING  Incomplete  Urine culture     Status: None   Collection Time: 05/24/15  1:10 AM  Result Value Ref Range Status   Specimen Description URINE, RANDOM  Final   Special Requests Normal  Final   Culture NO GROWTH 2 DAYS  Final   Report Status 05/26/2015 FINAL  Final  C difficile quick scan w PCR reflex     Status: Abnormal   Collection Time: 05/24/15  4:37 AM  Result Value Ref Range Status   C Diff antigen POSITIVE (A) NEGATIVE Final   C Diff toxin NEGATIVE NEGATIVE Final   C Diff interpretation   Final    Positive for toxigenic C. difficile, active toxin production not detected. Patient has toxigenic C. difficile organisms present in the bowel, but toxin was not detected. The patient may be a carrier or the level of toxin in the sample was below the limit  of detection. This information should be used in conjunction  with the patient's clinical history when deciding on possible therapy.   Clostridium Difficile by PCR     Status: Abnormal   Collection Time: 05/24/15  4:37 AM  Result Value Ref Range Status   Toxigenic C Difficile by pcr POSITIVE (A) NEGATIVE Final    Comment: CRITICAL RESULT CALLED TO, READ BACK BY AND VERIFIED WITH: MARCEL TURNER ON 05/24/15 AT 0643 BY QSD     Coagulation Studies: No results for input(s): LABPROT, INR in the last 72 hours.  Urinalysis:  Recent Labs Lab 05/24/15 0110  COLORURINE YELLOW*  LABSPEC 1.011  PHURINE 7.0  GLUCOSEU NEGATIVE  HGBUR 1+*  BILIRUBINUR NEGATIVE  KETONESUR NEGATIVE  PROTEINUR 100*  NITRITE NEGATIVE  LEUKOCYTESUR 2+*    Lipid Panel:  No results found for: CHOL, TRIG, HDL, CHOLHDL, VLDL, LDLCALC  HgbA1C:  Lab Results  Component Value Date   HGBA1C 8.0* 05/31/2014    Urine Drug Screen:  No results found for: LABOPIA, COCAINSCRNUR, LABBENZ, AMPHETMU, THCU, LABBARB  Alcohol Level:  Recent Labs Lab 05/23/15 [place] <5     Imaging: No results found.   Assessment/Plan:  59 y.o. female F admitted with seizures, PNA, UTI, C diff colitis.   Mental status improved this AM/afternoon. Able to hold conversation.  Likely component of hypoactive delirium and mental status can change through out the day. MRI as described a note from yesterday consistent with seizure activity and non convincing diffusion abnormality on L occipital lobe in one cute On dual anti platelet therapy 05/28/2015, 2:11 PM

## 2015-05-28 NOTE — Discharge Summary (Signed)
[facility] NAME: Sherri Davidson    MR#:  [id number]  DATE OF BIRTH:  June 29, 1955  DATE OF ADMISSION:  05/23/2015 ADMITTING PHYSICIAN: Sherri Shelling, MD  DATE OF DISCHARGE: 05/28/2015  PRIMARY CARE PHYSICIAN: Sherri Davidson    ADMISSION DIAGNOSIS:  Renal failure [N19] UTI (lower urinary tract infection) [N39.0] Community acquired pneumonia [J18.9] CVA (cerebral infarction) [I63.9] Peritoneal dialysis status ([place]) [Z99.2] New onset seizure ([place]) [R56.9]  DISCHARGE DIAGNOSIS:  Principal Problem:   Community acquired pneumonia Active Problems:   Seizure ([place])   Pressure ulcer    c diff      SECONDARY DIAGNOSIS:   Past Medical History  Diagnosis Date  . CVA, old, hemiparesis ([place])   . Hypertension   . Diabetes ([place])   . ESRD on peritoneal dialysis ([place])   . CHF (congestive heart failure) ([place])   . Hypothyroidism     HOSPITAL COURSE:   * Seizures  Loaded with dilantin.  Appreciated Neurology consult.  CT and MRI negative for any other findings. ( as per neurologist MRI does not have acute stroke)  Corrected level of Dilantin is in range- so spoke to neurologist- suggested to add keppra.  Appreciated final recommendation by neurologist.  * Pneumonia  On vanc + zosyn- changed to rocephin IV for UTI.  Negative cultures.  * UTI  Was On zosyn- ur cx sent.  Now on rocephin- Negative final cx. \  Will give 2 more days of cefuroxime.  * C diff colitis  On oral flagyl.  Isolation.  As flagyl decrease seizure threshold- changed to oral vancomycin.   Will give 14 days of oral van after stopping cefuroxime.  * ESRD- on peritoneal dialysis  Appreciated nephrology help.  * DM- ISS  * Htn- Metoprolol.  * Generalized weakness  Pt was [facility], now may need rehab,  CM and social worker are working on that.   DISCHARGE CONDITIONS:   Stable.  CONSULTS  OBTAINED:  Treatment Team:  Sherri Corporal, MD Sherri Iba, MD  DRUG ALLERGIES:   Allergies  Allergen Reactions  . Asa [Aspirin] Other (See Comments)    GI upset for non-enteric coated aspirin    DISCHARGE MEDICATIONS:   Current Discharge Medication List    START taking these medications   Details  cefUROXime (CEFTIN) 250 MG tablet Take 1 tablet (250 mg total) by mouth 2 (two) times daily with a meal. Qty: 6 tablet, Refills: 0    levETIRAcetam (KEPPRA) 500 MG tablet Take 1 tablet (500 mg total) by mouth 2 (two) times daily. Qty: 60 tablet, Refills: 0    nicotine (NICODERM CQ - DOSED IN MG/24 HOURS) 21 mg/24hr patch Place 1 patch (21 mg total) onto the skin daily. Qty: 28 patch, Refills: 0    phenytoin (DILANTIN) 300 MG ER capsule Take 1 capsule (300 mg total) by mouth at bedtime. Qty: 30 capsule, Refills: 0    vancomycin (VANCOCIN) 50 mg/mL oral solution Take 2.5 mLs (125 mg total) by mouth every 6 (six) hours. Qty: 100 mL, Refills: 1      CONTINUE these medications which have NOT CHANGED   Details  aspirin EC 81 MG tablet Take 81 mg by mouth daily.    ciprofloxacin (CIPRO) 500 MG tablet Take 500 mg by mouth See admin instructions. Take 1 tablet by mouth once a day after dialysis in case of peritonitis    citalopram (CELEXA) 10 MG tablet Take 10  mg by mouth daily.    clopidogrel (PLAVIX) 75 MG tablet Take 75 mg by mouth daily.    hydrOXYzine (VISTARIL) 25 MG capsule Take 25 mg by mouth daily as needed for itching.    levothyroxine (SYNTHROID, LEVOTHROID) 25 MCG tablet Take 25 mcg by mouth daily before breakfast.    mirtazapine (REMERON) 15 MG tablet Take 15 mg by mouth at bedtime.    pantoprazole (PROTONIX) 40 MG tablet Take 40 mg by mouth daily.    potassium chloride SA (K-DUR,KLOR-CON) 20 MEQ tablet Take 20 mEq by mouth at bedtime.    simvastatin (ZOCOR) 20 MG tablet Take 20 mg by mouth daily.    Vitamin D, Ergocalciferol, (DRISDOL) 50000 UNITS CAPS capsule  Take 50,000 Units by mouth every 30 (thirty) days.         DISCHARGE INSTRUCTIONS:    Follow with PMD , nephrologist in 2 weeks.  If you experience worsening of your admission symptoms, develop shortness of breath, life threatening emergency, suicidal or homicidal thoughts you must seek medical attention immediately by calling 911 or calling your MD immediately  if symptoms less severe.  You Must read complete instructions/literature along with all the possible adverse reactions/side effects for all the Medicines you take and that have been prescribed to you. Take any new Medicines after you have completely understood and accept all the possible adverse reactions/side effects.   Please note  You were cared for by a hospitalist during your hospital stay. If you have any questions about your discharge medications or the care you received while you were in the hospital after you are discharged, you can call the unit and asked to speak with the hospitalist on call if the hospitalist that took care of you is not available. Once you are discharged, your primary care physician will handle any further medical issues. Please note that NO REFILLS for any discharge medications will be authorized once you are discharged, as it is imperative that you return to your primary care physician (or establish a relationship with a primary care physician if you do not have one) for your aftercare needs so that they can reassess your need for medications and monitor your lab values.    Today   CHIEF COMPLAINT:   Chief Complaint  Patient presents with  . Seizures    HISTORY OF PRESENT ILLNESS:  Sherri Davidson  is a 59 y.o. female with a known history of CVA with hemorrhagic stroke, left hemiparesis, hypertension, diabetes mellitus, end-stage renal disease on peritoneal dialysis, congestive heart failure, hypothyroidism was brought by the EMS today after the patient had a seizure. According to the family member  was at bedside patient became stiff and her eyes rolled over. She had a seizure and EMS gave IV Versed medication. According to the family the seizure lasted for for 2-3 minutes. Patient was lethargic and confused. Patient's sister was at bedside says patient is weaker in the left upper extremity and left leg which is more pronounced than when she had the stroke in 2010. Patient was worked up with a CT head in the emergency room which showed no evidence of new stroke. After the CT scan was done, patient more awake and responsive to verbal commands. Patient opens eyes and is able to recognize family members. No complaints of any pain. Patient has cough for the last 2 days. Family member also suggested that patient had some difficulty speech after she had a seizure. The seizure occurred around 11 PM on 05/23/2015.  No history of any fall or head injury. During the workup in the emergency room chest x-ray showed pneumonia and patient's lactate level was elevated. No history of any fever or any chills.   VITAL SIGNS:  Blood pressure 103/75, pulse 75, temperature 97.6 F (36.4 C), temperature source Oral, resp. rate 16, height '5\' 5"'$  (1.651 m), weight 58.6 kg (129 lb 3 oz), SpO2 98 %.  I/O:   Intake/Output Summary (Last 24 hours) at 05/28/15 1102 Last data filed at 05/27/15 1750  Gross per 24 hour  Intake    395 ml  Output      0 ml  Net    395 ml    PHYSICAL EXAMINATION:   GENERAL: 59 y.o.-year-old patient lying in the bed with no acute distress.  EYES: Pupils equal, round, reactive to light and accommodation. No scleral icterus. Extraocular muscles intact.  HEENT: Head atraumatic, normocephalic. Oropharynx and nasopharynx clear.  NECK: Supple, no jugular venous distention. No thyroid enlargement, no tenderness.  LUNGS: Normal breath sounds bilaterally, no wheezing, rales,rhonchi or crepitation. No use of accessory muscles of respiration.  CARDIOVASCULAR: S1, S2 normal. No murmurs, rubs, or  gallops.  ABDOMEN: Soft, nontender, distended. Bowel sounds present. No organomegaly or mass. PD catheter present on abdomen. EXTREMITIES: No pedal edema, cyanosis, or clubbing.  NEUROLOGIC: Cranial nerves II through XII are intact. Muscle strength 4/5 in all extremities. Sensation intact. Gait not checked.  PSYCHIATRIC: The patient is alert and oriented. SKIN: No obvious rash, lesion, or ulcer. Marland Kitchen   DATA REVIEW:   CBC  Recent Labs Lab 05/26/15 0515  WBC 13.1*  HGB 11.1*  HCT 32.9*  PLT 313    Chemistries   Recent Labs Lab 05/23/15 2334  05/26/15 1227 05/27/15 0625  NA 138  < >  --  132*  K 4.1  < > 3.1* 3.3*  CL 107  < >  --  101  CO2 15*  < >  --  24  GLUCOSE 214*  < >  --  86  BUN 31*  < >  --  26*  CREATININE 6.43*  < >  --  4.31*  CALCIUM 8.8*  < >  --  8.1*  MG  --   --  1.7  --   AST 39  --   --   --   ALT 17  --   --   --   ALKPHOS 124  --   --   --   BILITOT 0.3  --   --   --   < > = values in this interval not displayed.  Cardiac Enzymes  Recent Labs Lab 05/23/15 2334  TROPONINI <0.03    Microbiology Results  Results for orders placed or performed during the hospital encounter of 05/23/15  Culture, blood (routine x 2)     Status: None (Preliminary result)   Collection Time: 05/23/15 11:34 PM  Result Value Ref Range Status   Specimen Description BLOOD RIGHT HAND  Final   Special Requests BOTTLES DRAWN AEROBIC AND ANAEROBIC 4CC  Final   Culture NO GROWTH 3 DAYS  Final   Report Status PENDING  Incomplete  Culture, blood (routine x 2)     Status: None (Preliminary result)   Collection Time: 05/24/15 12:59 AM  Result Value Ref Range Status   Specimen Description BLOOD  Final   Special Requests BOTTLES DRAWN AEROBIC AND ANAEROBIC 4CC  Final   Culture NO GROWTH 3 DAYS  Final  Report Status PENDING  Incomplete  Urine culture     Status: None   Collection Time: 05/24/15  1:10 AM  Result Value Ref Range Status   Specimen Description URINE,  RANDOM  Final   Special Requests Normal  Final   Culture NO GROWTH 2 DAYS  Final   Report Status 05/26/2015 FINAL  Final  C difficile quick scan w PCR reflex     Status: Abnormal   Collection Time: 05/24/15  4:37 AM  Result Value Ref Range Status   C Diff antigen POSITIVE (A) NEGATIVE Final   C Diff toxin NEGATIVE NEGATIVE Final   C Diff interpretation   Final    Positive for toxigenic C. difficile, active toxin production not detected. Patient has toxigenic C. difficile organisms present in the bowel, but toxin was not detected. The patient may be a carrier or the level of toxin in the sample was below the limit  of detection. This information should be used in conjunction with the patient's clinical history when deciding on possible therapy.   Clostridium Difficile by PCR     Status: Abnormal   Collection Time: 05/24/15  4:37 AM  Result Value Ref Range Status   Toxigenic C Difficile by pcr POSITIVE (A) NEGATIVE Final    Comment: CRITICAL RESULT CALLED TO, READ BACK BY AND VERIFIED WITH: MARCEL TURNER ON 05/24/15 AT 0643 BY QSD     RADIOLOGY:  No results found.    Management plans discussed with the patient, family and they are in agreement.  CODE STATUS:     Code Status Orders        Start     Ordered   05/24/15 0342  Full code   Continuous     05/24/15 0341    Advance Directive Documentation        Most Recent Value   Type of Advance Directive  Healthcare Power of Attorney   Pre-existing out of facility DNR order (yellow form or pink MOST form)     "MOST" Form in Place?        TOTAL TIME TAKING CARE OF THIS PATIENT: 35 minutes.    [name] on 05/28/2015 at 11:02 AM  Between 7am to 6pm - Pager - [phone]  After 6pm go to [email or web address] - password [email or web address] [facility]  Office  [phone]  CC: Primary care physician; [facility]   Note: This dictation was prepared with Dragon dictation  along with smaller phrase technology. Any transcriptional errors that result from this process are unintentional.

## 2015-05-28 NOTE — Care Management Important Message (Signed)
Important Message  Patient Details  Name: Sherri Davidson MRN: [id number] Date of Birth: [birth year]   Medicare Important Message Given:  Yes      Katrina Stack, RN 05/28/2015, 9:25 AM

## 2015-05-28 NOTE — Evaluation (Signed)
Occupational Therapy Evaluation Patient Details Name: Sherri Davidson MRN: [id number] DOB: 05/09/1956 Today's Date: 05/28/2015    History of Present Illness Patient is a 59 y/o female that presents after an acute seizure and small acute L PCA infarct. On this admission she has had a peritoneal port placed for dialysis. 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    Clinical Impression   Pt seen in room and is on enteric precautions.  No family present and pt about to be transported by [facility] back home. Pt was anxious and restless and assisted with moving up in bed with max assist due to decreased ability to follow instructions and rolled to her side instead of pushing up with her legs bent to move up in bed.  She has decreased gross and fine motor skills in Left non-dominant hand and unable to assess sensation due to decreased ability to follow directions.  She completed grooming skills with min assist and neglected left side of head but completed with mod cues.  She demonstrated a chewing motion in her mouth at all times but nothing was in her mouth and she does not have any teeth.  She requires min to mod assist for self feeding and rec built up plate and bowl to help with scooping using one hand, adaptive feeding utensils with a curved spoon and dycem to hold plate or bowl in place.  Gave pt printed copy of adaptive equipment catalog and no family present to review with.  She requires min assist and mod cues for LB dressing due to decreased safety from impulsivity, decreased awareness and problem solving and decreased balance when sitting up EOB.  Rec continued OT services to increase independence in ADLs and pt to go home today with [facility] providing OT and PT services.      Follow Up Recommendations  Home health OT (via [facility] which she had prior to hospitalization)    Equipment Recommendations   (rec built up feeding plate and bowl for self feeding and adaptive utensils)     Recommendations for Other Services       Precautions / Restrictions Precautions Precautions: Fall Restrictions Weight Bearing Restrictions: No      Mobility Bed Mobility                  Transfers                      Balance                                            ADL Overall ADL's : Needs assistance/impaired                                       General ADL Comments: Pt is able to use comb for grooming on R side of head only and neglects left side.  She requires constant cues to maintain attention to task and is impulsive and needs verbal reminders not to get up out of bed.  Min assist for set up for self feeding due to decreased function of LUE and hand.   Min assist for LB dressing with mod cues for safety.  Mod assit for donning and doffing  socks and shoes due to decreased balance and  poor safety awareness.        Vision     Perception     Praxis      Pertinent Vitals/Pain Pain Assessment: No/denies pain     Hand Dominance Right   Extremity/Trunk Assessment Upper Extremity Assessment Upper Extremity Assessment: Generalized weakness;LUE deficits/detail LUE Deficits / Details: Unable to complete full range AROM flexion of the shoulder from previous CVA LUE:  (pt not able to follow instructions to assess sensation and would point to the air vs on her arm for light touch) LUE Coordination: decreased fine motor;decreased gross motor   Lower Extremity Assessment Lower Extremity Assessment: Defer to PT evaluation   Cervical / Trunk Assessment Cervical / Trunk Assessment: Kyphotic   Communication Communication Communication: Expressive difficulties   Cognition Arousal/Alertness: Awake/alert Behavior During Therapy: Anxious;Flat affect;Impulsive Overall Cognitive Status: Difficult to assess Area of Impairment: Safety/judgement;Problem solving         Safety/Judgement: Decreased awareness of safety    Problem Solving: Slow processing     General Comments       Exercises       Shoulder Instructions      Home Living Family/patient expects to be discharged to:: Private residence Living Arrangements: Other relatives Available Help at Discharge: Family (pt reports she lives with her sister)               Bathroom Shower/Tub: Tub/shower unit Shower/tub characteristics: Curtain Biochemist, clinical: Standard     Home Equipment: Environmental consultant - 2 wheels;Shower seat;Hand held shower head          Prior Functioning/Environment Level of Independence: Independent with assistive device(s)        Comments: Patient is a poor historian, and does not answer all questions asked and not sure of reliability of answers and has expresssive difficulties.    OT Diagnosis: Generalized weakness;Cognitive deficits;Hemiplegia non-dominant side   OT Problem List: Decreased strength;Decreased range of motion;Decreased activity tolerance;Pain;Decreased coordination;Decreased cognition;Decreased safety awareness;Impaired balance (sitting and/or standing)   OT Treatment/Interventions: Self-care/ADL training;Neuromuscular education;Patient/family education;Therapeutic activities    OT Goals(Current goals can be found in the care plan section) Acute Rehab OT Goals Patient Stated Goal: To return home  OT Goal Formulation: With patient Time For Goal Achievement: 06/11/15 Potential to Achieve Goals: Good  OT Frequency: Min 1X/week   Barriers to D/C:            Co-evaluation              End of Session Equipment Utilized During Treatment:  (adaptive equip catalog left with patient in room--rec feeding equip)  Activity Tolerance: Patient tolerated treatment well Patient left: in bed;with call bell/phone within reach;with bed alarm set   Time: 1350-1415 OT Time Calculation (min): 25 min Charges:  OT General Charges $OT Visit: 1 Procedure OT Evaluation $Initial OT Evaluation Tier I: 1  Procedure OT Treatments $Self Care/Home Management : 8-22 mins G-Codes:    Wofford,Susan 06-01-2015, 2:35 PM    Chrys Racer, OTR/L

## 2015-05-28 NOTE — Care Management (Signed)
Left voicemail per request to patient's primary provider at [facility] regarding discharge today.  Patient and her sister decline skilled nursing placement.  [facility] will be able to provide PT OT and additional in home services.  [facility] to transport

## 2015-05-28 NOTE — Progress Notes (Signed)
Discharge packet given to patient along with education on new medications and stroke. IV removed and tele. [facility] is to pick patient up. Have been in contact with them and they will be here around 1330-1345 to give the dialysis nurse time to get patient off peritoneal dialysis. Treatment is currently still running. Prescriptions also packed with patient's belongings. No further questions at this time. [name], patient's sister, has been called and updated patient is going home.

## 2015-05-28 NOTE — Progress Notes (Signed)
Subjective:  Issue encountered with PD machine overnight by floor RN. Therefore PD was paused. Pt placed on PD this AM and now functioning well. Overall pt much more awake and alert this AM.    Objective:  Vital signs in last 24 hours:  Temp:  [97.3 F (36.3 C)-97.9 F (36.6 C)] 97.9 F (36.6 C) (12/14 1147) Pulse Rate:  [75-80] 75 (12/14 1147) Resp:  [16-18] 18 (12/14 1147) BP: (103-124)/(60-75) 119/60 mmHg (12/14 1147) SpO2:  [95 %-98 %] 97 % (12/14 1147) Weight:  [58.6 kg (129 lb 3 oz)] 58.6 kg (129 lb 3 oz) (12/14 0507)  Weight change: 0.4 kg (14.1 oz) Filed Weights   05/26/15 0412 05/27/15 0544 05/28/15 0507  Weight: 58.741 kg (129 lb 8 oz) 58.2 kg (128 lb 4.9 oz) 58.6 kg (129 lb 3 oz)    Intake/Output:    Intake/Output Summary (Last 24 hours) at 05/28/15 1213 Last data filed at 05/27/15 1750  Gross per 24 hour  Intake    395 ml  Output      0 ml  Net    395 ml     Physical Exam: General:  chronically ill-appearing   HEENT  moist mucous membranes   Neck  supple   Pulm/lungs  clear to auscultation bilaterally, normal effort   CVS/Heart  irregular rhythm   Abdomen:   soft, mildly distended, BS present  Extremities:  no peripheral edema   Neurologic:  awake, alert, follows commands   Skin:  chronic excoriation lesions on b/l LE's  Access:  PD catheter        Basic Metabolic Panel:   Recent Labs Lab 05/23/15 2334 05/24/15 0627 05/26/15 0515 05/26/15 1227 05/27/15 0625  NA 138 137 130*  --  132*  K 4.1 4.1 2.7* 3.1* 3.3*  CL 107 106 96*  --  101  CO2 15* 20* 26  --  24  GLUCOSE 214* 284* 146*  --  86  BUN 31* 30* 26*  --  26*  CREATININE 6.43* 6.18* 4.65*  --  4.31*  CALCIUM 8.8* 8.5* 8.3*  --  8.1*  MG  --   --   --  1.7  --      CBC:  Recent Labs Lab 05/23/15 2334 05/24/15 0627 05/26/15 0515  WBC 20.5* 16.0* 13.1*  NEUTROABS 16.2* 14.2*  --   HGB 12.1 11.6* 11.1*  HCT 37.6 35.4 32.9*  MCV 94.5 93.4 91.8  PLT 401 276 313       Microbiology:  Recent Results (from the past 720 hour(s))  Culture, blood (routine x 2)     Status: None (Preliminary result)   Collection Time: 05/23/15 11:34 PM  Result Value Ref Range Status   Specimen Description BLOOD RIGHT HAND  Final   Special Requests BOTTLES DRAWN AEROBIC AND ANAEROBIC 4CC  Final   Culture NO GROWTH 3 DAYS  Final   Report Status PENDING  Incomplete  Culture, blood (routine x 2)     Status: None (Preliminary result)   Collection Time: 05/24/15 12:59 AM  Result Value Ref Range Status   Specimen Description BLOOD  Final   Special Requests BOTTLES DRAWN AEROBIC AND ANAEROBIC 4CC  Final   Culture NO GROWTH 3 DAYS  Final   Report Status PENDING  Incomplete  Urine culture     Status: None   Collection Time: 05/24/15  1:10 AM  Result Value Ref Range Status   Specimen Description URINE, RANDOM  Final   Special  Requests Normal  Final   Culture NO GROWTH 2 DAYS  Final   Report Status 05/26/2015 FINAL  Final  C difficile quick scan w PCR reflex     Status: Abnormal   Collection Time: 05/24/15  4:37 AM  Result Value Ref Range Status   C Diff antigen POSITIVE (A) NEGATIVE Final   C Diff toxin NEGATIVE NEGATIVE Final   C Diff interpretation   Final    Positive for toxigenic C. difficile, active toxin production not detected. Patient has toxigenic C. difficile organisms present in the bowel, but toxin was not detected. The patient may be a carrier or the level of toxin in the sample was below the limit  of detection. This information should be used in conjunction with the patient's clinical history when deciding on possible therapy.   Clostridium Difficile by PCR     Status: Abnormal   Collection Time: 05/24/15  4:37 AM  Result Value Ref Range Status   Toxigenic C Difficile by pcr POSITIVE (A) NEGATIVE Final    Comment: CRITICAL RESULT CALLED TO, READ BACK BY AND VERIFIED WITH: MARCEL TURNER ON 05/24/15 AT 0643 BY QSD     Coagulation Studies: No results  for input(s): LABPROT, INR in the last 72 hours.  Urinalysis: No results for input(s): COLORURINE, LABSPEC, PHURINE, GLUCOSEU, HGBUR, BILIRUBINUR, KETONESUR, PROTEINUR, UROBILINOGEN, NITRITE, LEUKOCYTESUR in the last 72 hours.  Invalid input(s): APPERANCEUR    Imaging: No results found.   Medications:     . aspirin EC  81 mg Oral Daily  . cefTRIAXone (ROCEPHIN)  IV  1 g Intravenous Q24H  . citalopram  10 mg Oral Daily  . clopidogrel  75 mg Oral Daily  . dialysis solution 1.5% low-MG/low-CA   Intraperitoneal Q24H  . dialysis solution 1.5% low-MG/low-CA   Intraperitoneal Q24H  . heparin  5,000 Units Subcutaneous 3 times per day  . insulin aspart  0-5 Units Subcutaneous QHS  . insulin aspart  0-9 Units Subcutaneous TID WC  . levETIRAcetam  500 mg Intravenous Q12H  . levothyroxine  25 mcg Oral QAC breakfast  . mirtazapine  15 mg Oral QHS  . nicotine  21 mg Transdermal Daily  . pantoprazole  40 mg Oral QAC breakfast  . phenytoin  300 mg Oral QHS  . simvastatin  20 mg Oral Daily  . vancomycin  125 mg Oral 4 times per day  . Vitamin D (Ergocalciferol)  50,000 Units Oral Q30 days   acetaminophen, hydrALAZINE, LORazepam, ondansetron (ZOFRAN) IV  Assessment/ Plan:  59 y.o. Caucasian female with end-stage renal disease on peritoneal dialysis, anemia, secondary hyperparathyroidism, diabetes type 2, malnutrition, history of stroke with left leg weakness, current smoker, presents from home due to witnessed tonic-clonic seizure, new left-sided weakness   1. ESRD. Chronic  PD . Most recent kt/v 2.02.  CCPD 6 x 2000 cc as outpatient -  Seen during PD treatment, tolerating well, complete PD today.    2. HTN  - Remains normotensive off antihypertensive therapy.  3. Seizures - MRI findings noted, currently on aspirin '81mg'$  po daily as CVA maybe the the cause.    4. AOCKD - hgb 11.1, hold Epogen now given seizure activity and CVA.   5. C diff colitis - Treatment as per hospitalist  team   LOS: 4 Khylon Davies 12/14/201612:13 PM

## 2015-05-28 NOTE — Progress Notes (Signed)
[facility] transport arrived to get patient. Patient ready and dressed in transport gown due to none of her clothing being available. Transferred patient into wheelchair and [facility] employee took patient out. Dialysis nurse has already been in to take patient off treatment and dressing is clean and intact. Paperwork sent with patient.

## 2015-05-29 LAB — CULTURE, BLOOD (ROUTINE X 2)
CULTURE: NO GROWTH
Culture: NO GROWTH

## 2015-05-29 LAB — MISC LABCORP TEST (SEND OUT): Labcorp test code: 9985

## 2015-07-31 ENCOUNTER — Emergency Department
Admission: EM | Admit: 2015-07-31 | Discharge: 2015-07-31 | Disposition: A | Discharge status: Home / Self Care | Payer: Medicare (Managed Care) | Attending: Emergency Medicine | Admitting: Emergency Medicine

## 2015-07-31 ENCOUNTER — Encounter: Payer: Self-pay | Admitting: Emergency Medicine

## 2015-07-31 DIAGNOSIS — R112 Nausea with vomiting, unspecified: Secondary | ICD-10-CM | POA: Diagnosis present

## 2015-07-31 DIAGNOSIS — Z7902 Long term (current) use of antithrombotics/antiplatelets: Secondary | ICD-10-CM | POA: Diagnosis not present

## 2015-07-31 DIAGNOSIS — Z7982 Long term (current) use of aspirin: Secondary | ICD-10-CM | POA: Insufficient documentation

## 2015-07-31 DIAGNOSIS — Z87891 Personal history of nicotine dependence: Secondary | ICD-10-CM | POA: Insufficient documentation

## 2015-07-31 DIAGNOSIS — N186 End stage renal disease: Secondary | ICD-10-CM | POA: Insufficient documentation

## 2015-07-31 DIAGNOSIS — E119 Type 2 diabetes mellitus without complications: Secondary | ICD-10-CM | POA: Diagnosis not present

## 2015-07-31 DIAGNOSIS — Z792 Long term (current) use of antibiotics: Secondary | ICD-10-CM | POA: Diagnosis not present

## 2015-07-31 DIAGNOSIS — Z79899 Other long term (current) drug therapy: Secondary | ICD-10-CM | POA: Diagnosis not present

## 2015-07-31 DIAGNOSIS — R110 Nausea: Secondary | ICD-10-CM

## 2015-07-31 DIAGNOSIS — I120 Hypertensive chronic kidney disease with stage 5 chronic kidney disease or end stage renal disease: Secondary | ICD-10-CM | POA: Diagnosis not present

## 2015-07-31 DIAGNOSIS — Z992 Dependence on renal dialysis: Secondary | ICD-10-CM | POA: Insufficient documentation

## 2015-07-31 LAB — COMPREHENSIVE METABOLIC PANEL
ALBUMIN: 2.3 g/dL — AB (ref 3.5–5.0)
ALT: 11 U/L — ABNORMAL LOW (ref 14–54)
ANION GAP: 10 (ref 5–15)
AST: 22 U/L (ref 15–41)
Alkaline Phosphatase: 154 U/L — ABNORMAL HIGH (ref 38–126)
BUN: 34 mg/dL — ABNORMAL HIGH (ref 6–20)
CALCIUM: 8.5 mg/dL — AB (ref 8.9–10.3)
CHLORIDE: 102 mmol/L (ref 101–111)
CO2: 20 mmol/L — AB (ref 22–32)
Creatinine, Ser: 5.15 mg/dL — ABNORMAL HIGH (ref 0.44–1.00)
GFR calc non Af Amer: 8 mL/min — ABNORMAL LOW (ref >60)
GFR, EST AFRICAN AMERICAN: 10 mL/min — AB (ref >60)
GLUCOSE: 141 mg/dL — AB (ref 65–99)
POTASSIUM: 3.9 mmol/L (ref 3.5–5.1)
SODIUM: 132 mmol/L — AB (ref 135–145)
Total Bilirubin: 0.6 mg/dL (ref 0.3–1.2)
Total Protein: 7.3 g/dL (ref 6.5–8.1)

## 2015-07-31 LAB — CBC
HCT: 34.3 % — ABNORMAL LOW (ref 35.0–47.0)
HEMOGLOBIN: 11.3 g/dL — AB (ref 12.0–16.0)
MCH: 27.7 pg (ref 26.0–34.0)
MCHC: 33 g/dL (ref 32.0–36.0)
MCV: 83.9 fL (ref 80.0–100.0)
PLATELETS: 450 10*3/uL — AB (ref 150–440)
RBC: 4.08 MIL/uL (ref 3.80–5.20)
RDW: 14.7 % — ABNORMAL HIGH (ref 11.5–14.5)
WBC: 9.9 10*3/uL (ref 3.6–11.0)

## 2015-07-31 LAB — LIPASE, BLOOD: Lipase: 22 U/L (ref 11–51)

## 2015-07-31 MED ORDER — ONDANSETRON HCL 4 MG PO TABS: 4.0000 mg | ORAL_TABLET | Freq: Three times a day (TID) | ORAL | Status: DC | PRN

## 2015-07-31 MED ORDER — ONDANSETRON HCL 4 MG/2ML IJ SOLN
4.0000 mg | Freq: Once | INTRAMUSCULAR | Status: AC
  Administered 2015-07-31: 4 mg via INTRAVENOUS
  Filled 2015-07-31: qty 2

## 2015-07-31 NOTE — ED Notes (Signed)
PO challenge given per MD; pt w/out her dentures, water and applesauce given.

## 2015-07-31 NOTE — Discharge Instructions (Signed)
Please seek medical attention for any high fevers, chest pain, shortness of breath, change in behavior, persistent vomiting, bloody stool or any other new or concerning symptoms. ° ° °Nausea and Vomiting °Nausea is a sick feeling that often comes before throwing up (vomiting). Vomiting is a reflex where stomach contents come out of your mouth. Vomiting can cause severe loss of body fluids (dehydration). Children and elderly adults can become dehydrated quickly, especially if they also have diarrhea. Nausea and vomiting are symptoms of a condition or disease. It is important to find the cause of your symptoms. °CAUSES  °· Direct irritation of the stomach lining. This irritation can result from increased acid production (gastroesophageal reflux disease), infection, food poisoning, taking certain medicines (such as nonsteroidal anti-inflammatory drugs), alcohol use, or tobacco use. °· Signals from the brain. These signals could be caused by a headache, heat exposure, an inner ear disturbance, increased pressure in the brain from injury, infection, a tumor, or a concussion, pain, emotional stimulus, or metabolic problems. °· An obstruction in the gastrointestinal tract (bowel obstruction). °· Illnesses such as diabetes, hepatitis, gallbladder problems, appendicitis, kidney problems, cancer, sepsis, atypical symptoms of a heart attack, or eating disorders. °· Medical treatments such as chemotherapy and radiation. °· Receiving medicine that makes you sleep (general anesthetic) during surgery. °DIAGNOSIS °Your caregiver may ask for tests to be done if the problems do not improve after a few days. Tests may also be done if symptoms are severe or if the reason for the nausea and vomiting is not clear. Tests may include: °· Urine tests. °· Blood tests. °· Stool tests. °· Cultures (to look for evidence of infection). °· X-rays or other imaging studies. °Test results can help your caregiver make decisions about treatment or the  need for additional tests. °TREATMENT °You need to stay well hydrated. Drink frequently but in small amounts. You may wish to drink water, sports drinks, clear broth, or eat frozen ice pops or gelatin dessert to help stay hydrated. When you eat, eating slowly may help prevent nausea. There are also some antinausea medicines that may help prevent nausea. °HOME CARE INSTRUCTIONS  °· Take all medicine as directed by your caregiver. °· If you do not have an appetite, do not force yourself to eat. However, you must continue to drink fluids. °· If you have an appetite, eat a normal diet unless your caregiver tells you differently. °¨ Eat a variety of complex carbohydrates (rice, wheat, potatoes, bread), lean meats, yogurt, fruits, and vegetables. °¨ Avoid high-fat foods because they are more difficult to digest. °· Drink enough water and fluids to keep your urine clear or pale yellow. °· If you are dehydrated, ask your caregiver for specific rehydration instructions. Signs of dehydration may include: °¨ Severe thirst. °¨ Dry lips and mouth. °¨ Dizziness. °¨ Dark urine. °¨ Decreasing urine frequency and amount. °¨ Confusion. °¨ Rapid breathing or pulse. °SEEK IMMEDIATE MEDICAL CARE IF:  °· You have blood or brown flecks (like coffee grounds) in your vomit. °· You have black or bloody stools. °· You have a severe headache or stiff neck. °· You are confused. °· You have severe abdominal pain. °· You have chest pain or trouble breathing. °· You do not urinate at least once every 8 hours. °· You develop cold or clammy skin. °· You continue to vomit for longer than 24 to 48 hours. °· You have a fever. °MAKE SURE YOU:  °· Understand these instructions. °· Will watch your condition. °· Will get help right away   if you are not doing well or get worse. °  °This information is not intended to replace advice given to you by your health care provider. Make sure you discuss any questions you have with your health care provider. °    °Document Released: 05/31/2005 Document Revised: 08/23/2011 Document Reviewed: 10/28/2010 °Elsevier Interactive Patient Education ©2016 [employer] ° °

## 2015-07-31 NOTE — ED Provider Notes (Signed)
[facility] Emergency Department Provider Note   ____________________________________________  Time seen: ~1345  I have reviewed the triage vital signs and the nursing notes.   HISTORY  Chief Complaint Nausea and Emesis   History limited by: Poor historian   HPI Sherri Davidson is a 60 y.o. female with multiple medical problems who presents to the emergency department today because of concern for vomiting. The patient states that she started vomiting yesterday. She states that she has been having vomiting constantly. States it has been over 10 times. She denies any bloody or biliary emesis. She denies any associated abdominal pain. States her last BM was yesterday and that it was normal for her. She denies similar symptoms in the past. Denies any fevers.    Past Medical History  Diagnosis Date  . CVA, old, hemiparesis ([place])   . Hypertension   . Diabetes ([place])   . ESRD on peritoneal dialysis ([place])   . CHF (congestive heart failure) ([place])   . Hypothyroidism     Patient Active Problem List   Diagnosis Date Noted  . Seizure ([place]) 05/24/2015  . Community acquired pneumonia 05/24/2015  . Pressure ulcer 05/24/2015    Past Surgical History  Procedure Laterality Date  . Cholecystectomy    . Amputation toe    . Insertion of dialysis catheter      Current Outpatient Rx  Name  Route  Sig  Dispense  Refill  . aspirin EC 81 MG tablet   Oral   Take 81 mg by mouth daily.         . ciprofloxacin (CIPRO) 500 MG tablet   Oral   Take 500 mg by mouth See admin instructions. Take 1 tablet by mouth once a day after dialysis in case of peritonitis         . citalopram (CELEXA) 10 MG tablet   Oral   Take 10 mg by mouth daily.         . clopidogrel (PLAVIX) 75 MG tablet   Oral   Take 75 mg by mouth daily.         . hydrOXYzine (VISTARIL) 25 MG capsule   Oral   Take 25 mg by mouth daily as needed for itching.         . levETIRAcetam (KEPPRA)  500 MG tablet   Oral   Take 1 tablet (500 mg total) by mouth 2 (two) times daily.   60 tablet   0   . levothyroxine (SYNTHROID, LEVOTHROID) 25 MCG tablet   Oral   Take 25 mcg by mouth daily before breakfast.         . mirtazapine (REMERON) 15 MG tablet   Oral   Take 15 mg by mouth at bedtime.         . nicotine (NICODERM CQ - DOSED IN MG/24 HOURS) 21 mg/24hr patch   Transdermal   Place 1 patch (21 mg total) onto the skin daily.   28 patch   0   . pantoprazole (PROTONIX) 40 MG tablet   Oral   Take 40 mg by mouth daily.         . phenytoin (DILANTIN) 300 MG ER capsule   Oral   Take 1 capsule (300 mg total) by mouth at bedtime.   30 capsule   0   . potassium chloride SA (K-DUR,KLOR-CON) 20 MEQ tablet   Oral   Take 20 mEq by mouth at bedtime.         Marland Kitchen  simvastatin (ZOCOR) 20 MG tablet   Oral   Take 20 mg by mouth daily.         . Vitamin D, Ergocalciferol, (DRISDOL) 50000 UNITS CAPS capsule   Oral   Take 50,000 Units by mouth every 30 (thirty) days.           Allergies Asa  History reviewed. No pertinent family history.  Social History Social History  Substance Use Topics  . Smoking status: Former Smoker -- 1.00 packs/day for 30 years    Types: Cigarettes  . Smokeless tobacco: None  . Alcohol Use: No    Review of Systems  Constitutional: Negative for fever. Cardiovascular: Negative for chest pain. Respiratory: Negative for shortness of breath. Gastrointestinal: Negative for abdominal pain. Positive for vomiting. Negative for diarrhea. Neurological: Negative for headaches, focal weakness or numbness.  10-point ROS otherwise negative.  ____________________________________________   PHYSICAL EXAM:  VITAL SIGNS: ED Triage Vitals  Enc Vitals Group     BP 07/31/15 1313 180/86 mmHg     Pulse Rate 07/31/15 1313 83     Resp 07/31/15 1313 24     Temp 07/31/15 1313 97.4 F (36.3 C)     Temp Source 07/31/15 1313 Oral     SpO2 07/31/15 1313  100 %     Weight 07/31/15 1313 129 lb 10.1 oz (58.8 kg)     Height 07/31/15 1313 '5\' 4"'$  (1.626 m)   Constitutional: Alert and oriented. Well appearing and in no distress. Eyes: Conjunctivae are normal. PERRL. Normal extraocular movements. ENT   Head: Normocephalic and atraumatic.   Nose: No congestion/rhinnorhea.   Mouth/Throat: Mucous membranes are moist.   Neck: No stridor. Hematological/Lymphatic/Immunilogical: No cervical lymphadenopathy. Cardiovascular: Normal rate, regular rhythm.  No murmurs, rubs, or gallops. Respiratory: Normal respiratory effort without tachypnea nor retractions. Breath sounds are clear and equal bilaterally. No wheezes/rales/rhonchi. Gastrointestinal: Soft and nontender. No distention. There is no CVA tenderness. Genitourinary: Deferred Musculoskeletal: Normal range of motion in all extremities. No joint effusions.  No lower extremity tenderness nor edema. Neurologic:  Normal speech and language. No gross focal neurologic deficits are appreciated.  Skin:  Skin is warm, dry and intact. No rash noted. Psychiatric: Mood and affect are normal. Speech and behavior are normal. Patient exhibits appropriate insight and judgment.  ____________________________________________    LABS (pertinent positives/negatives)  Labs Reviewed  COMPREHENSIVE METABOLIC PANEL - Abnormal; Notable for the following:    Sodium 132 (*)    CO2 20 (*)    Glucose, Bld 141 (*)    BUN 34 (*)    Creatinine, Ser 5.15 (*)    Calcium 8.5 (*)    Albumin 2.3 (*)    ALT 11 (*)    Alkaline Phosphatase 154 (*)    GFR calc non Af Amer 8 (*)    GFR calc Af Amer 10 (*)    All other components within normal limits  CBC - Abnormal; Notable for the following:    Hemoglobin 11.3 (*)    HCT 34.3 (*)    RDW 14.7 (*)    Platelets 450 (*)    All other components within normal limits  LIPASE, BLOOD      ____________________________________________   EKG  None  ____________________________________________    RADIOLOGY  None  ____________________________________________   PROCEDURES  Procedure(s) performed: None  Critical Care performed: No  ____________________________________________   INITIAL IMPRESSION / ASSESSMENT AND PLAN / ED COURSE  Pertinent labs & imaging results that were available during my  care of the patient were reviewed by me and considered in my medical decision making (see chart for details).  Patient presented to the emergency department today because of concerns for nausea. On exam patient with some dry heaving. Patient's blood work without any concerning findings. Patient felt better after Zofran was able to tolerate by mouth. The patient never had any abdominal pain did not have any abdominal pain after eating here. Her abdomen was benign. Will plan on discharging home with Zofran. Discussed importance of following up with primary care doctor partner  ____________________________________________   FINAL CLINICAL IMPRESSION(S) / ED DIAGNOSES  Final diagnoses:  Nausea     Nance Pear, MD 07/31/15 [phone]

## 2015-07-31 NOTE — ED Notes (Signed)
Pt sister and healthcare POA, Lennix Rotundo contacted to be notified of pt discharge.  Pt placed in lobby to wait on ride.  First nurse made aware.

## 2015-07-31 NOTE — ED Notes (Signed)
Pt to ED via EMS from home c/o n/v since last night.  Pt unable to complete dialysis at home last night.  Denies pain at this time.  Pt A&Ox4, speaking in complete and coherent sentences and in NAD at this time.

## 2015-08-17 ENCOUNTER — Encounter: Payer: Self-pay | Admitting: Emergency Medicine

## 2015-08-17 ENCOUNTER — Emergency Department: Payer: Medicare (Managed Care)

## 2015-08-17 ENCOUNTER — Observation Stay
Admission: EM | Admit: 2015-08-17 | Discharge: 2015-08-18 | Disposition: A | Discharge status: Home / Self Care | Payer: Medicare (Managed Care) | Attending: Internal Medicine | Admitting: Internal Medicine

## 2015-08-17 DIAGNOSIS — I69354 Hemiplegia and hemiparesis following cerebral infarction affecting left non-dominant side: Secondary | ICD-10-CM | POA: Insufficient documentation

## 2015-08-17 DIAGNOSIS — F329 Major depressive disorder, single episode, unspecified: Secondary | ICD-10-CM | POA: Diagnosis not present

## 2015-08-17 DIAGNOSIS — N186 End stage renal disease: Secondary | ICD-10-CM | POA: Insufficient documentation

## 2015-08-17 DIAGNOSIS — I739 Peripheral vascular disease, unspecified: Secondary | ICD-10-CM | POA: Insufficient documentation

## 2015-08-17 DIAGNOSIS — E1122 Type 2 diabetes mellitus with diabetic chronic kidney disease: Secondary | ICD-10-CM | POA: Diagnosis not present

## 2015-08-17 DIAGNOSIS — R001 Bradycardia, unspecified: Secondary | ICD-10-CM | POA: Diagnosis not present

## 2015-08-17 DIAGNOSIS — G40909 Epilepsy, unspecified, not intractable, without status epilepticus: Secondary | ICD-10-CM | POA: Diagnosis not present

## 2015-08-17 DIAGNOSIS — I120 Hypertensive chronic kidney disease with stage 5 chronic kidney disease or end stage renal disease: Secondary | ICD-10-CM | POA: Insufficient documentation

## 2015-08-17 DIAGNOSIS — Z87891 Personal history of nicotine dependence: Secondary | ICD-10-CM | POA: Diagnosis not present

## 2015-08-17 DIAGNOSIS — F0150 Vascular dementia without behavioral disturbance: Secondary | ICD-10-CM | POA: Insufficient documentation

## 2015-08-17 DIAGNOSIS — Z992 Dependence on renal dialysis: Secondary | ICD-10-CM | POA: Diagnosis not present

## 2015-08-17 DIAGNOSIS — E039 Hypothyroidism, unspecified: Secondary | ICD-10-CM | POA: Diagnosis not present

## 2015-08-17 DIAGNOSIS — R1110 Vomiting, unspecified: Secondary | ICD-10-CM | POA: Diagnosis present

## 2015-08-17 DIAGNOSIS — E876 Hypokalemia: Secondary | ICD-10-CM | POA: Insufficient documentation

## 2015-08-17 DIAGNOSIS — R531 Weakness: Secondary | ICD-10-CM | POA: Diagnosis present

## 2015-08-17 DIAGNOSIS — I951 Orthostatic hypotension: Secondary | ICD-10-CM | POA: Diagnosis not present

## 2015-08-17 DIAGNOSIS — I509 Heart failure, unspecified: Secondary | ICD-10-CM | POA: Insufficient documentation

## 2015-08-17 DIAGNOSIS — E872 Acidosis: Secondary | ICD-10-CM | POA: Insufficient documentation

## 2015-08-17 DIAGNOSIS — Z79899 Other long term (current) drug therapy: Secondary | ICD-10-CM | POA: Insufficient documentation

## 2015-08-17 DIAGNOSIS — R42 Dizziness and giddiness: Secondary | ICD-10-CM | POA: Diagnosis present

## 2015-08-17 DIAGNOSIS — Z7982 Long term (current) use of aspirin: Secondary | ICD-10-CM | POA: Diagnosis not present

## 2015-08-17 DIAGNOSIS — I2510 Atherosclerotic heart disease of native coronary artery without angina pectoris: Secondary | ICD-10-CM | POA: Diagnosis not present

## 2015-08-17 DIAGNOSIS — R197 Diarrhea, unspecified: Secondary | ICD-10-CM | POA: Diagnosis not present

## 2015-08-17 DIAGNOSIS — R4781 Slurred speech: Secondary | ICD-10-CM | POA: Insufficient documentation

## 2015-08-17 DIAGNOSIS — Z7902 Long term (current) use of antithrombotics/antiplatelets: Secondary | ICD-10-CM | POA: Diagnosis not present

## 2015-08-17 DIAGNOSIS — D631 Anemia in chronic kidney disease: Secondary | ICD-10-CM | POA: Insufficient documentation

## 2015-08-17 LAB — URINALYSIS COMPLETE WITH MICROSCOPIC (ARMC ONLY)
BILIRUBIN URINE: NEGATIVE
Glucose, UA: 150 mg/dL — AB
HGB URINE DIPSTICK: NEGATIVE
KETONES UR: NEGATIVE mg/dL
LEUKOCYTES UA: NEGATIVE
NITRITE: NEGATIVE
PH: 6 (ref 5.0–8.0)
PROTEIN: 100 mg/dL — AB
RBC / HPF: NONE SEEN RBC/hpf (ref 0–5)
SPECIFIC GRAVITY, URINE: 1.013 (ref 1.005–1.030)

## 2015-08-17 LAB — CBC
HCT: 28.5 % — ABNORMAL LOW (ref 35.0–47.0)
Hemoglobin: 9.4 g/dL — ABNORMAL LOW (ref 12.0–16.0)
MCH: 26.6 pg (ref 26.0–34.0)
MCHC: 32.9 g/dL (ref 32.0–36.0)
MCV: 80.8 fL (ref 80.0–100.0)
PLATELETS: 276 10*3/uL (ref 150–440)
RBC: 3.53 MIL/uL — ABNORMAL LOW (ref 3.80–5.20)
RDW: 16.5 % — AB (ref 11.5–14.5)
WBC: 7.6 10*3/uL (ref 3.6–11.0)

## 2015-08-17 LAB — COMPREHENSIVE METABOLIC PANEL
ALT: 10 U/L — ABNORMAL LOW (ref 14–54)
ANION GAP: 10 (ref 5–15)
AST: 26 U/L (ref 15–41)
Albumin: 2 g/dL — ABNORMAL LOW (ref 3.5–5.0)
Alkaline Phosphatase: 142 U/L — ABNORMAL HIGH (ref 38–126)
BUN: 45 mg/dL — AB (ref 6–20)
CHLORIDE: 109 mmol/L (ref 101–111)
CO2: 18 mmol/L — ABNORMAL LOW (ref 22–32)
Calcium: 8.2 mg/dL — ABNORMAL LOW (ref 8.9–10.3)
Creatinine, Ser: 6.47 mg/dL — ABNORMAL HIGH (ref 0.44–1.00)
GFR calc Af Amer: 7 mL/min — ABNORMAL LOW (ref >60)
GFR, EST NON AFRICAN AMERICAN: 6 mL/min — AB (ref >60)
Glucose, Bld: 143 mg/dL — ABNORMAL HIGH (ref 65–99)
POTASSIUM: 4.1 mmol/L (ref 3.5–5.1)
Sodium: 137 mmol/L (ref 135–145)
Total Bilirubin: 0.4 mg/dL (ref 0.3–1.2)
Total Protein: 6 g/dL — ABNORMAL LOW (ref 6.5–8.1)

## 2015-08-17 LAB — TROPONIN I
TROPONIN I: 0.03 ng/mL (ref <0.031)
Troponin I: <0.03 ng/mL (ref <0.031)

## 2015-08-17 LAB — C DIFFICILE QUICK SCREEN W PCR REFLEX
C DIFFICILE (CDIFF) TOXIN: NEGATIVE
C DIFFICLE (CDIFF) ANTIGEN: NEGATIVE
C Diff interpretation: NEGATIVE

## 2015-08-17 LAB — MRSA PCR SCREENING: MRSA by PCR: POSITIVE — AB

## 2015-08-17 MED ORDER — HEPARIN 1000 UNIT/ML FOR PERITONEAL DIALYSIS
500.0000 [IU] | INTRAMUSCULAR | Status: DC | PRN
  Filled 2015-08-17: qty 0.5

## 2015-08-17 MED ORDER — HYDROXYZINE PAMOATE 25 MG PO CAPS
25.0000 mg | ORAL_CAPSULE | Freq: Every day | ORAL | Status: DC | PRN
  Filled 2015-08-17: qty 1

## 2015-08-17 MED ORDER — ASPIRIN EC 81 MG PO TBEC
81.0000 mg | DELAYED_RELEASE_TABLET | Freq: Every day | ORAL | Status: DC
  Administered 2015-08-17 – 2015-08-18 (×2): 81 mg via ORAL
  Filled 2015-08-17 (×2): qty 1

## 2015-08-17 MED ORDER — CIPROFLOXACIN HCL 500 MG PO TABS: 500.0000 mg | ORAL_TABLET | ORAL | Status: DC | PRN

## 2015-08-17 MED ORDER — GENTAMICIN SULFATE 0.1 % EX CREA
1.0000 "application " | TOPICAL_CREAM | Freq: Every day | CUTANEOUS | Status: DC
  Administered 2015-08-18: 1 via TOPICAL
  Filled 2015-08-17: qty 15

## 2015-08-17 MED ORDER — CIPROFLOXACIN HCL 500 MG PO TABS: 500.0000 mg | ORAL_TABLET | ORAL | Status: DC

## 2015-08-17 MED ORDER — BISACODYL 5 MG PO TBEC
5.0000 mg | DELAYED_RELEASE_TABLET | Freq: Every day | ORAL | Status: DC | PRN
  Filled 2015-08-17: qty 1

## 2015-08-17 MED ORDER — SODIUM CHLORIDE 0.9 % IV BOLUS (SEPSIS)
1000.0000 mL | Freq: Once | INTRAVENOUS | Status: AC
  Administered 2015-08-17: 1000 mL via INTRAVENOUS

## 2015-08-17 MED ORDER — CLOPIDOGREL BISULFATE 75 MG PO TABS
75.0000 mg | ORAL_TABLET | Freq: Every day | ORAL | Status: DC
  Administered 2015-08-17 – 2015-08-18 (×2): 75 mg via ORAL
  Filled 2015-08-17 (×2): qty 1

## 2015-08-17 MED ORDER — ACETAMINOPHEN 650 MG RE SUPP: 650.0000 mg | Freq: Four times a day (QID) | RECTAL | Status: DC | PRN

## 2015-08-17 MED ORDER — CITALOPRAM HYDROBROMIDE 20 MG PO TABS
10.0000 mg | ORAL_TABLET | Freq: Every day | ORAL | Status: DC
  Administered 2015-08-17 – 2015-08-18 (×2): 10 mg via ORAL
  Filled 2015-08-17 (×2): qty 1

## 2015-08-17 MED ORDER — ONDANSETRON HCL 4 MG PO TABS: 4.0000 mg | ORAL_TABLET | Freq: Four times a day (QID) | ORAL | Status: DC | PRN

## 2015-08-17 MED ORDER — PANTOPRAZOLE SODIUM 40 MG PO TBEC
40.0000 mg | DELAYED_RELEASE_TABLET | Freq: Every day | ORAL | Status: DC
  Administered 2015-08-17 – 2015-08-18 (×2): 40 mg via ORAL
  Filled 2015-08-17 (×2): qty 1

## 2015-08-17 MED ORDER — ONDANSETRON HCL 4 MG/2ML IJ SOLN: 4.0000 mg | Freq: Four times a day (QID) | INTRAMUSCULAR | Status: DC | PRN

## 2015-08-17 MED ORDER — POTASSIUM CHLORIDE CRYS ER 20 MEQ PO TBCR
20.0000 meq | EXTENDED_RELEASE_TABLET | Freq: Every day | ORAL | Status: DC
  Administered 2015-08-17: 20 meq via ORAL
  Filled 2015-08-17: qty 1

## 2015-08-17 MED ORDER — LEVETIRACETAM 500 MG PO TABS
500.0000 mg | ORAL_TABLET | Freq: Every day | ORAL | Status: DC
  Administered 2015-08-17 – 2015-08-18 (×2): 500 mg via ORAL
  Filled 2015-08-17 (×2): qty 1

## 2015-08-17 MED ORDER — HEPARIN SODIUM (PORCINE) 5000 UNIT/ML IJ SOLN
5000.0000 [IU] | Freq: Three times a day (TID) | INTRAMUSCULAR | Status: DC
  Administered 2015-08-17 – 2015-08-18 (×3): 5000 [IU] via SUBCUTANEOUS
  Filled 2015-08-17 (×3): qty 1

## 2015-08-17 MED ORDER — NICOTINE 21 MG/24HR TD PT24
21.0000 mg | MEDICATED_PATCH | Freq: Every day | TRANSDERMAL | Status: DC
  Administered 2015-08-17 – 2015-08-18 (×2): 21 mg via TRANSDERMAL
  Filled 2015-08-17 (×2): qty 1

## 2015-08-17 MED ORDER — MIRTAZAPINE 15 MG PO TABS
15.0000 mg | ORAL_TABLET | Freq: Every day | ORAL | Status: DC
  Administered 2015-08-17: 15 mg via ORAL
  Filled 2015-08-17: qty 1

## 2015-08-17 MED ORDER — LEVOTHYROXINE SODIUM 50 MCG PO TABS
25.0000 ug | ORAL_TABLET | Freq: Every day | ORAL | Status: DC
  Administered 2015-08-17 – 2015-08-18 (×2): 25 ug via ORAL
  Filled 2015-08-17 (×3): qty 1

## 2015-08-17 MED ORDER — ALUM & MAG HYDROXIDE-SIMETH 200-200-20 MG/5ML PO SUSP: 30.0000 mL | Freq: Four times a day (QID) | ORAL | Status: DC | PRN

## 2015-08-17 MED ORDER — VITAMIN D (ERGOCALCIFEROL) 1.25 MG (50000 UNIT) PO CAPS
50000.0000 [IU] | ORAL_CAPSULE | ORAL | Status: DC
  Administered 2015-08-17: 50000 [IU] via ORAL
  Filled 2015-08-17 (×2): qty 1

## 2015-08-17 MED ORDER — PHENYTOIN SODIUM EXTENDED 100 MG PO CAPS: 300.0000 mg | ORAL_CAPSULE | Freq: Every day | ORAL | Status: DC

## 2015-08-17 MED ORDER — SIMVASTATIN 20 MG PO TABS
20.0000 mg | ORAL_TABLET | Freq: Every day | ORAL | Status: DC
  Administered 2015-08-17 – 2015-08-18 (×2): 20 mg via ORAL
  Filled 2015-08-17 (×2): qty 1

## 2015-08-17 MED ORDER — HYDROCODONE-ACETAMINOPHEN 5-325 MG PO TABS: 1.0000 | ORAL_TABLET | ORAL | Status: DC | PRN

## 2015-08-17 MED ORDER — ACETAMINOPHEN 325 MG PO TABS: 650.0000 mg | ORAL_TABLET | Freq: Four times a day (QID) | ORAL | Status: DC | PRN

## 2015-08-17 MED ORDER — DELFLEX-LC/1.5% DEXTROSE 346 MOSM/L IP SOLN
INTRAPERITONEAL | Status: DC
  Administered 2015-08-17: 12 L via INTRAPERITONEAL
  Filled 2015-08-17 (×2): qty 3000

## 2015-08-17 NOTE — ED Notes (Signed)
Per EMS patient was eating her dinner sitting in her bed and she had a "dizzy spell" and laid herself back into her pillow.  She felt frightened by that and called her sister and her sister called EMS.  Patient is AOx4 and in no obvious distress.  Pt denies pain.

## 2015-08-17 NOTE — Care Management Obs Status (Signed)
[place] NOTIFICATION   Patient Details  Name: Sherri Davidson MRN: [id number] Date of Birth: 05-18-1956   Medicare Observation Status Notification Given:  Yes (dizziness)    Ival Bible, RN 08/17/2015, 8:37 AM

## 2015-08-17 NOTE — ED Notes (Signed)
Dining services called for breakfast tray.

## 2015-08-17 NOTE — H&P (Signed)
[place] [facility] [place] NAME: Sherri Davidson    MR#:  [id number]  DATE OF BIRTH:  20-Jul-1955  DATE OF ADMISSION:  08/17/2015  PRIMARY CARE PHYSICIAN: [facility]   REQUESTING/REFERRING PHYSICIAN: Dr. Dahlia Client  CHIEF COMPLAINT: Near syncope    Chief Complaint  Patient presents with  . Near Syncope    HISTORY OF PRESENT ILLNESS:  Sherri Davidson  is a 60 y.o. female with a known history of multi-infarct dementia, hypertension, peritoneal dialysis brought in by the cystoscopy secondary to episode of generalized weakness, pale looking skin and slurred speech yesterday afternoon. Because of that patient was brought in. Patient has multi-infarct dementia and cared by her sister. History of seizure disorder and Dr. Melrose Nakayama adjusted seizure medications and week ago patient now not taking Dilantin but she is taking only Keppra 500 mg daily. [facility]. Still says she  never had vomiting but the ER doctor mentioned that she had a vomiting noted to have orthostatic hypotension. Bradycardia or 50 when she stood up in the emergency room. Concerning orthostatic hypotension. we were asked to admit the patient.Marland Kitchen PAST MEDICAL HISTORY:   Past Medical History  Diagnosis Date  . CVA, old, hemiparesis ([place])   . Hypertension   . Diabetes ([place])   . ESRD on peritoneal dialysis ([place])   . CHF (congestive heart failure) ([place])   . Hypothyroidism     PAST SURGICAL HISTOIRY:   Past Surgical History  Procedure Laterality Date  . Cholecystectomy    . Amputation toe    . Insertion of dialysis catheter      SOCIAL HISTORY:   Social History  Substance Use Topics  . Smoking status: Former Smoker -- 1.00 packs/day for 30 years    Types: Cigarettes  . Smokeless tobacco: Not on file  . Alcohol Use: No    FAMILY HISTORY:  History reviewed. No pertinent family history.  DRUG ALLERGIES:   Allergies  Allergen  Reactions  . Salicylates Hives and Nausea Only    Uncoated.  Diona Fanti [Aspirin] Other (See Comments)    GI upset for non-enteric coated aspirin    REVIEW OF SYSTEMS:  CONSTITUTIONAL: No fever, fatigue or weakness.  EYES: No blurred or double vision.  EARS, NOSE, AND THROAT: No tinnitus or ear pain.  RESPIRATORY: No cough, shortness of breath, wheezing or hemoptysis.  CARDIOVASCULAR: No chest pain, orthopnea, edema.  GASTROINTESTINAL: No nausea, vomiting, diarrhea or abdominal pain.  GENITOURINARY: No dysuria, hematuria.  ENDOCRINE: No polyuria, nocturia,  HEMATOLOGY: No anemia, easy bruising or bleeding SKIN: No rash or lesion. MUSCULOSKELETAL: No joint pain or arthritis.   NEUROLOGIC: generalized  weakness of hands and legs secondary to her previous infarcts.  PSYCHIATRY: No anxiety or depression.   MEDICATIONS AT HOME:   Prior to Admission medications   Medication Sig Start Date End Date Taking? Authorizing Provider  aspirin EC 81 MG tablet Take 81 mg by mouth every morning.    Yes Historical Provider, MD  ciprofloxacin (CIPRO) 500 MG tablet Take 500 mg by mouth See admin instructions. Take 1 tablet by mouth once a day after dialysis in case of peritonitis. *Only take if directed by kidney doctor*   Yes Historical Provider, MD  citalopram (CELEXA) 10 MG tablet Take 10 mg by mouth every morning.    Yes Historical Provider, MD  clopidogrel (PLAVIX) 75 MG tablet Take 75 mg by mouth every morning.  Yes Historical Provider, MD  hydrOXYzine (VISTARIL) 25 MG capsule Take 25 mg by mouth every morning.    Yes Historical Provider, MD  levETIRAcetam (KEPPRA) 500 MG tablet Take 1 tablet (500 mg total) by mouth 2 (two) times daily. Patient taking differently: Take 500 mg by mouth daily.  05/28/15  Yes Vaughan Basta, MD  levothyroxine (SYNTHROID, LEVOTHROID) 25 MCG tablet Take 25 mcg by mouth daily before breakfast.   Yes Historical Provider, MD  mirtazapine (REMERON) 15 MG tablet Take  15 mg by mouth at bedtime.   Yes Historical Provider, MD  nicotine (NICODERM CQ - DOSED IN MG/24 HOURS) 21 mg/24hr patch Place 1 patch (21 mg total) onto the skin daily. 05/28/15  Yes Vaughan Basta, MD  nicotine (NICODERM CQ - DOSED IN MG/24 HR) 7 mg/24hr patch Place 7 mg onto the skin daily.   Yes Historical Provider, MD  ondansetron (ZOFRAN) 4 MG tablet Take 1 tablet (4 mg total) by mouth every 8 (eight) hours as needed for nausea or vomiting. 07/31/15  Yes Nance Pear, MD  oxyCODONE-acetaminophen (PERCOCET/ROXICET) 5-325 MG tablet Take 1 tablet by mouth every 6 (six) hours as needed. For pain.   Yes Historical Provider, MD  pantoprazole (PROTONIX) 40 MG tablet Take 40 mg by mouth every morning.    Yes Historical Provider, MD  potassium chloride SA (K-DUR,KLOR-CON) 20 MEQ tablet Take 20 mEq by mouth at bedtime.   Yes Historical Provider, MD  promethazine (PHENERGAN) 25 MG tablet Take 25 mg by mouth every 8 (eight) hours as needed for nausea or vomiting.   Yes Historical Provider, MD  simvastatin (ZOCOR) 20 MG tablet Take 20 mg by mouth at bedtime.    Yes Historical Provider, MD  Vitamin D, Ergocalciferol, (DRISDOL) 50000 UNITS CAPS capsule Take 50,000 Units by mouth every 30 (thirty) days.   Yes Historical Provider, MD      VITAL SIGNS:  Blood pressure 76/27, pulse 74, temperature 97.8 F (36.6 C), temperature source Oral, resp. rate 16, height '5\' 5"'$  (1.651 m), SpO2 99 %.  PHYSICAL EXAMINATION:  GENERAL:  60 y.o.-year-old patient lying in the bed with no acute distress.  EYES: Pupils equal, round, reactive to light and accommodation. No scleral icterus. Extraocular muscles intact.  HEENT: Head atraumatic, normocephalic. Oropharynx and nasopharynx clear.  NECK:  Supple, no jugular venous distention. No thyroid enlargement, no tenderness.  LUNGS: Normal breath sounds bilaterally, no wheezing, rales,rhonchi or crepitation. No use of accessory muscles of respiration.  CARDIOVASCULAR:  S1, S2 normal. No murmurs, rubs, or gallops. Ejection murmur present in the aortic area..N: Soft, nontender, nondistended. Bowel sounds present. No organomegaly or mass. Dialysis catheter is in the abdomen and the site is clean.  EXTREMITIES: No pedal edema, cyanosis, or clubbing.  NEUROLOGIC: Cranial nerves II through XII are intact. Muscle strength 5/5 in all extremities. Sensation intact. Gait not checked.  PSYCHIATRIC: The patient is alert and oriented x 3.  SKIN: No obvious rash, lesion, or ulcer.   LABORATORY PANEL:   CBC  Recent Labs Lab 08/17/15 0042  WBC 7.6  HGB 9.4*  HCT 28.5*  PLT 276   ------------------------------------------------------------------------------------------------------------------  Chemistries   Recent Labs Lab 08/17/15 0042  NA 137  K 4.1  CL 109  CO2 18*  GLUCOSE 143*  BUN 45*  CREATININE 6.47*  CALCIUM 8.2*  AST 26  ALT 10*  ALKPHOS 142*  BILITOT 0.4   ------------------------------------------------------------------------------------------------------------------  Cardiac Enzymes  Recent Labs Lab 08/17/15 0412  TROPONINI <0.03   ------------------------------------------------------------------------------------------------------------------  RADIOLOGY:  Ct Head Wo Contrast  08/17/2015  CLINICAL DATA:  Acute onset of generalized weakness. Initial encounter. EXAM: CT HEAD WITHOUT CONTRAST TECHNIQUE: Contiguous axial images were obtained from the base of the skull through the vertex without intravenous contrast. COMPARISON:  CT of the head performed 05/23/2015, and MRI of the brain performed 05/24/2015 FINDINGS: There is no evidence of acute infarction, mass lesion, or intra- or extra-axial hemorrhage on CT. Prominence of the ventricles and sulci reflects mild cortical volume loss. Scattered periventricular and subcortical white matter change likely reflects small vessel ischemic microangiopathy. Chronic encephalomalacia is noted at  the high right posterior parietal lobe, reflecting remote infarct. Chronic ischemic change is noted at the basal ganglia bilaterally. The brainstem and fourth ventricle are within normal limits. No mass effect or midline shift is seen. There is no evidence of fracture; visualized osseous structures are unremarkable in appearance. The visualized portions of the orbits are within normal limits. The paranasal sinuses and mastoid air cells are well-aerated. No significant soft tissue abnormalities are seen. IMPRESSION: 1. No acute intracranial pathology seen on CT. 2. Mild cortical volume loss and scattered small vessel ischemic microangiopathy. 3. Chronic encephalomalacia at the high right posterior parietal lobe, reflecting remote infarct. Chronic ischemic change at the basal ganglia bilaterally. Electronically Signed   By: Garald Balding M.D.   On: 08/17/2015 06:11    EKG:   Orders placed or performed during the hospital encounter of 08/17/15  . ED EKG  . ED EKG  EKG shows normal sinus rhythm with T-wave flattening in aVL, V3 and V4 V5 and V6. Heart rate 80 bpm  IMPRESSION AND PLAN:  #1 .dizziness on Saturday hypertension: Monitor closely for possible early sepsis: Head CAT scan unremarkable. Patient will be monitored overnight for possible early sepsis versus seizure #2 near syncope due to hypotension likely vasovagal. #3 multi-infarct dementia. #4. History of seizure disorder: EEG done on Friday but I cannot access the results. But according to the patient's sister she is right now on Keppra 500 mg daily only. Continue keppra  500 mg Per day. #5 hypertension, coronary artery disease: Patient had a history of stent placed before. #6 ESRD: Patient is on peritoneal dialysis. Consult nephrology.  7 depression #8. Hypothyroidism: #9.DVT  prophylaxis, GI prophylaxis.   All the records are reviewed and case discussed with ED provider. Management plans discussed with the patient, family and they are  in agreement.  CODE STATUS: full  TOTAL TIME TAKING CARE OF THIS PATIENT: 27mnutes.    KEpifanio LeschesM.D on 08/17/2015 at 10:19 AM  Between 7am to 6pm - Pager - [phone]  After 6pm go to [email or web address] - password [email or web address]  Office  [phone] CC: Primary care physician; [facility] Note: This dictation was prepared with Dragon dictation along with smaller phrase technology. Any transcriptional errors that result from this process are unintentional.

## 2015-08-17 NOTE — ED Notes (Signed)
Patient family came out asking about a plan. I spoke with Dr. Dahlia Client and she said that they were waiting on the admitting Dr. I relayed this information to the family and they stated that they understood. The patient stated that she was hungry, spoke with Elenore Rota, RN and he is placing diet order at this time.

## 2015-08-17 NOTE — Progress Notes (Signed)
Subjective:  Patient is known to our practice from outpatient. She is followed for peritoneal dialysis. She has history of stroke with hemiparesis, hypertension, diabetes, end-stage renal disease requiring peritoneal dialysis, congestive heart failure and hypothyroidism. She presents for complaints of dizziness and slurred speech that started on the afternoon of admission. This morning, she feels better No acute complains of shortness of breath   Objective:  Vital signs in last 24 hours:  Temp:  [97.2 F (36.2 C)-98.3 F (36.8 C)] 97.2 F (36.2 C) (03/05 1300) Pulse Rate:  [61-74] 71 (03/05 1300) Resp:  [13-21] 18 (03/05 1300) BP: (76-145)/(27-109) 130/75 mmHg (03/05 1300) SpO2:  [71 %-100 %] 100 % (03/05 1030) Weight:  [56.7 kg (125 lb)] 56.7 kg (125 lb) (03/05 0936)  Weight change:  Filed Weights   08/17/15 0936  Weight: 56.7 kg (125 lb)    Intake/Output:    Intake/Output Summary (Last 24 hours) at 08/17/15 1501 Last data filed at 08/17/15 1300  Gross per 24 hour  Intake   1300 ml  Output      0 ml  Net   1300 ml     Physical Exam: General:  no acute distress, laying in the bed, thin, frail   HEENT  anicteric, moist oral mucous membranes   Neck  supple   Pulm/lungs  normal breathing effort, no crackles   CVS/Heart  irregular, no rub or gallop   Abdomen:   soft, nontender   Extremities:  no peripheral edema   Neurologic:  alert, able to follow commands   Skin:  no acute rashes   Access:  PD catheter        Basic Metabolic Panel:   Recent Labs Lab 08/17/15 0042  NA 137  K 4.1  CL 109  CO2 18*  GLUCOSE 143*  BUN 45*  CREATININE 6.47*  CALCIUM 8.2*     CBC:  Recent Labs Lab 08/17/15 0042  WBC 7.6  HGB 9.4*  HCT 28.5*  MCV 80.8  PLT 276      Microbiology:  Recent Results (from the past 720 hour(s))  MRSA PCR Screening     Status: Abnormal   Collection Time: 08/17/15 12:02 PM  Result Value Ref Range Status   MRSA by PCR POSITIVE (A)  NEGATIVE Final    Comment:        The GeneXpert MRSA Assay (FDA approved for NASAL specimens only), is one component of a comprehensive MRSA colonization surveillance program. It is not intended to diagnose MRSA infection nor to guide or monitor treatment for MRSA infections. CRITICAL RESULT CALLED TO, READ BACK BY AND VERIFIED WITH: MICHAEL JACOBS AT 1340 ON 08/17/15. CTJ     Coagulation Studies: No results for input(s): LABPROT, INR in the last 72 hours.  Urinalysis:  Recent Labs  08/17/15 0215  COLORURINE YELLOW*  LABSPEC 1.013  PHURINE 6.0  GLUCOSEU 150*  HGBUR NEGATIVE  BILIRUBINUR NEGATIVE  KETONESUR NEGATIVE  PROTEINUR 100*  NITRITE NEGATIVE  LEUKOCYTESUR NEGATIVE      Imaging: Ct Head Wo Contrast  08/17/2015  CLINICAL DATA:  Acute onset of generalized weakness. Initial encounter. EXAM: CT HEAD WITHOUT CONTRAST TECHNIQUE: Contiguous axial images were obtained from the base of the skull through the vertex without intravenous contrast. COMPARISON:  CT of the head performed 05/23/2015, and MRI of the brain performed 05/24/2015 FINDINGS: There is no evidence of acute infarction, mass lesion, or intra- or extra-axial hemorrhage on CT. Prominence of the ventricles and sulci reflects mild cortical volume  loss. Scattered periventricular and subcortical white matter change likely reflects small vessel ischemic microangiopathy. Chronic encephalomalacia is noted at the high right posterior parietal lobe, reflecting remote infarct. Chronic ischemic change is noted at the basal ganglia bilaterally. The brainstem and fourth ventricle are within normal limits. No mass effect or midline shift is seen. There is no evidence of fracture; visualized osseous structures are unremarkable in appearance. The visualized portions of the orbits are within normal limits. The paranasal sinuses and mastoid air cells are well-aerated. No significant soft tissue abnormalities are seen. IMPRESSION: 1. No  acute intracranial pathology seen on CT. 2. Mild cortical volume loss and scattered small vessel ischemic microangiopathy. 3. Chronic encephalomalacia at the high right posterior parietal lobe, reflecting remote infarct. Chronic ischemic change at the basal ganglia bilaterally. Electronically Signed   By: Garald Balding M.D.   On: 08/17/2015 06:11     Medications:     . aspirin EC  81 mg Oral Daily  . citalopram  10 mg Oral Daily  . clopidogrel  75 mg Oral Daily  . heparin  5,000 Units Subcutaneous 3 times per day  . levETIRAcetam  500 mg Oral Daily  . levothyroxine  25 mcg Oral QAC breakfast  . mirtazapine  15 mg Oral QHS  . nicotine  21 mg Transdermal Daily  . pantoprazole  40 mg Oral Daily  . potassium chloride SA  20 mEq Oral QHS  . simvastatin  20 mg Oral Daily  . Vitamin D (Ergocalciferol)  50,000 Units Oral Q30 days   acetaminophen **OR** acetaminophen, alum & mag hydroxide-simeth, bisacodyl, ciprofloxacin, HYDROcodone-acetaminophen, hydrOXYzine, ondansetron **OR** ondansetron (ZOFRAN) IV  Assessment/ Plan:  60 y.o. female with end-stage renal disease on peritoneal dialysis, anemia, secondary hyperparathyroidism, diabetes type 2, malnutrition, history of stroke with left leg weakness, current smoker,h/o tonic-clonic seizure, CVA with left-sided weakness, history of C. difficile colitis This time presents for dizziness and slurred speech. Found to have significant orthostatic hypotension here. Patient does have some orthostatic hypotension at baseline  1. ESRD. ChronicPD/[facility]/ [address] .  CCPD 6 x 2000 cc   - will order PD during the hospitalization   2. HTN  - Blood pressure control is acceptable at present - Low-dose clonidine as necessary  3. AOCKD - hgb 9.4,  - Continue to monitor    LOS:  Islam Villescas 3/5/20173:01 PM

## 2015-08-17 NOTE — ED Notes (Signed)
Consulted with MD on wound on right arm with discoloration and rancid smell.  Wound appeared to be healing well and no infection found.  The old dressing was gauze with tegaderm over the whole wound and the patient was scratching the bandage.  We found petechiae under the tegaderm where the patient had been scratching.

## 2015-08-17 NOTE — ED Notes (Signed)
Caregiver arrived to patients room and I was given a different story that EMS gave.  This caregiver describes the episode at home like a TIA or weakness spell.  She witnessed the patient go from a sitting up eating her meal position to a fading back to the bed scenario.  She also stated the patient looked like she was trying to muster up the strength to stay sitting up.  Patient does have a hx of CVA (with a seizure) with the most recent one being Dec 2016.  This caregiver also said she had slurred speech but has not exhibited that since her arrival here.  She had an EEG on March 3rd 2017 @ [facility] by Dr. Melrose Nakayama.  Patient is irate at EMS because she says they made the patient walk to their stretcher although the patient was obviously weak.

## 2015-08-17 NOTE — ED Notes (Signed)
Per patient's sister, patient had "two bites of food and a carton of milk". Family made aware that once in her room on the floor, she can call down and order what she wants to eat. Patient moving to room 220 at this time.

## 2015-08-17 NOTE — ED Provider Notes (Signed)
[facility] Emergency Department Provider Note  ____________________________________________  Time seen: Approximately 6:43 AM  I have reviewed the triage vital signs and the nursing notes.   HISTORY  Chief Complaint Near Syncope    HPI Sherri Davidson is a 60 y.o. female who comes into the hospital today with some feeling of dizziness. The patient reports that she felt swimmy headed tonight and couldn't sit up. She reports that she started feeling sick this afternoon and vomited twice. The patient has no diarrhea or abdominal pain. She reports that her emesis looked yucky like her food. She denies any sick contacts. The patient has a history of end-stage renal disease and does peritoneal dialysis at home. The patient reports that she typically doesn't nightly but did miss her dialysis yesterday. She's had no chest pain no shortness of breath or numbness. The patient has not had any focal weakness as well. The patient reports she's never had this before and denies any pain. When the patient's caretaker arrived she reports that the patient seemed to be slurring her speech. The patient does have a skin tear with some discoloration on a bandage on her right forearm which is concerning to the caretaker as well. They report that given her history of stroke they wanted her to be evaluated.   Past Medical History  Diagnosis Date  . CVA, old, hemiparesis ([place])   . Hypertension   . Diabetes ([place])   . ESRD on peritoneal dialysis ([place])   . CHF (congestive heart failure) ([place])   . Hypothyroidism     Patient Active Problem List   Diagnosis Date Noted  . Seizure ([place]) 05/24/2015  . Community acquired pneumonia 05/24/2015  . Pressure ulcer 05/24/2015    Past Surgical History  Procedure Laterality Date  . Cholecystectomy    . Amputation toe    . Insertion of dialysis catheter      Current Outpatient Rx  Name  Route  Sig  Dispense  Refill  . aspirin EC 81 MG tablet   Oral   Take 81 mg by mouth daily.         . ciprofloxacin (CIPRO) 500 MG tablet   Oral   Take 500 mg by mouth See admin instructions. Take 1 tablet by mouth once a day after dialysis in case of peritonitis         . citalopram (CELEXA) 10 MG tablet   Oral   Take 10 mg by mouth daily.         . clopidogrel (PLAVIX) 75 MG tablet   Oral   Take 75 mg by mouth daily.         . hydrOXYzine (VISTARIL) 25 MG capsule   Oral   Take 25 mg by mouth daily as needed for itching.         . levETIRAcetam (KEPPRA) 500 MG tablet   Oral   Take 1 tablet (500 mg total) by mouth 2 (two) times daily. Patient taking differently: Take 500 mg by mouth daily.    60 tablet   0   . levothyroxine (SYNTHROID, LEVOTHROID) 25 MCG tablet   Oral   Take 25 mcg by mouth daily before breakfast.         . mirtazapine (REMERON) 15 MG tablet   Oral   Take 15 mg by mouth at bedtime.         . nicotine (NICODERM CQ - DOSED IN MG/24 HOURS) 21 mg/24hr patch   Transdermal  Place 1 patch (21 mg total) onto the skin daily.   28 patch   0   . pantoprazole (PROTONIX) 40 MG tablet   Oral   Take 40 mg by mouth daily.         . potassium chloride SA (K-DUR,KLOR-CON) 20 MEQ tablet   Oral   Take 20 mEq by mouth at bedtime.         . promethazine (PHENERGAN) 25 MG tablet   Oral   Take 25 mg by mouth every 8 (eight) hours as needed for nausea or vomiting.         . simvastatin (ZOCOR) 20 MG tablet   Oral   Take 20 mg by mouth daily.         . Vitamin D, Ergocalciferol, (DRISDOL) 50000 UNITS CAPS capsule   Oral   Take 50,000 Units by mouth every 30 (thirty) days.         . ondansetron (ZOFRAN) 4 MG tablet   Oral   Take 1 tablet (4 mg total) by mouth every 8 (eight) hours as needed for nausea or vomiting.   20 tablet   0   . phenytoin (DILANTIN) 300 MG ER capsule   Oral   Take 1 capsule (300 mg total) by mouth at bedtime.   30 capsule   0     Allergies Asa  History  reviewed. No pertinent family history.  Social History Social History  Substance Use Topics  . Smoking status: Former Smoker -- 1.00 packs/day for 30 years    Types: Cigarettes  . Smokeless tobacco: None  . Alcohol Use: No    Review of Systems Constitutional: No fever/chills Eyes: No visual changes. ENT: No sore throat. Cardiovascular: Denies chest pain. Respiratory: Denies shortness of breath. Gastrointestinal: Vomiting with no abdominal pain Genitourinary: Negative for dysuria. Musculoskeletal: Negative for back pain. Skin: Negative for rash. Neurological: Dizziness and lightheadedness  10-point ROS otherwise negative.  ____________________________________________   PHYSICAL EXAM:  VITAL SIGNS: ED Triage Vitals  Enc Vitals Group     BP 08/17/15 0017 138/75 mmHg     Pulse Rate 08/17/15 0017 74     Resp 08/17/15 0017 20     Temp 08/17/15 0017 98.3 F (36.8 C)     Temp Source 08/17/15 0017 Oral     SpO2 08/17/15 0017 100 %     Weight --      Height --      Head Cir --      Peak Flow --      Pain Score 08/17/15 0018 0     Pain Loc --      Pain Edu? --      Excl. in [place]? --     Constitutional: Alert and oriented. Well appearing and in mild distress. Eyes: Conjunctivae are normal. PERRL. EOMI. Head: Atraumatic. Nose: No congestion/rhinnorhea. Mouth/Throat: Mucous membranes are moist.  Oropharynx non-erythematous. Cardiovascular: Normal rate, regular rhythm. Systolic murmur.  Good peripheral circulation. Respiratory: Normal respiratory effort.  No retractions. Lungs CTAB. Gastrointestinal: Soft and nontender. No distention. Positive bowel sounds Musculoskeletal: No lower extremity tenderness nor edema.   Neurologic:  Normal speech and language. Cranial nerves II through XII are grossly intact with no focal, motor or neuro deficits Skin:  Skin is warm, dry skin tear noted to right forearm with no significant warmth or cellulitis. The patient does have some  nonblanching erythema scattered about her right forearm as well. Nontender to palpation.  Psychiatric: Mood and affect  are normal.   ____________________________________________   LABS (all labs ordered are listed, but only abnormal results are displayed)  Labs Reviewed  CBC - Abnormal; Notable for the following:    RBC 3.53 (*)    Hemoglobin 9.4 (*)    HCT 28.5 (*)    RDW 16.5 (*)    All other components within normal limits  COMPREHENSIVE METABOLIC PANEL - Abnormal; Notable for the following:    CO2 18 (*)    Glucose, Bld 143 (*)    BUN 45 (*)    Creatinine, Ser 6.47 (*)    Calcium 8.2 (*)    Total Protein 6.0 (*)    Albumin 2.0 (*)    ALT 10 (*)    Alkaline Phosphatase 142 (*)    GFR calc non Af Amer 6 (*)    GFR calc Af Amer 7 (*)    All other components within normal limits  URINALYSIS COMPLETEWITH MICROSCOPIC ([facility] ONLY) - Abnormal; Notable for the following:    Color, Urine YELLOW (*)    APPearance CLEAR (*)    Glucose, UA 150 (*)    Protein, ur 100 (*)    Bacteria, UA RARE (*)    Squamous Epithelial / LPF 0-5 (*)    All other components within normal limits  TROPONIN I  TROPONIN I   ____________________________________________  EKG  ED ECG REPORT I, Loney Hering, the attending physician, personally viewed and interpreted this ECG.   Date: 08/17/2015  EKG Time: 0011  Rate: 80  Rhythm: normal sinus rhythm  Axis: normal  Intervals:none  ST&T Change: t wave flattening in leads 1, aVL, V3, V4, V5, V6  ____________________________________________  RADIOLOGY  CT head: No acute intracranial pathology seen on CT, mild cortical volume loss and scattered small vessel ischemic microangiopathy, chronic encephalomalacia at that high right posterior parietal lobe reflecting remote infarct, chronic ischemic change at the basal ganglia bilaterally. ____________________________________________   PROCEDURES  Procedure(s) performed: None  Critical Care  performed: No  ____________________________________________   INITIAL IMPRESSION / ASSESSMENT AND PLAN / ED COURSE  Pertinent labs & imaging results that were available during my care of the patient were reviewed by me and considered in my medical decision making (see chart for details).  The patient's initial blood work was unremarkable. We did do some orthostatic vital signs and it was found that the patient was orthostatic with a blood pressure in the 80s over 40s when she was standing. I did give the patient a liter although she had not yet received dialysis and even after the liter the patient's blood pressure was still in the 80s over 60s. Given that the patient has a history of end-stage renal disease on dialysis as well as previous strokes I feel the patient would be better served admitted to the hospital for gentle fluid hydration and further evaluation of her orthostasis. The patient does still feel dizzy as well when she stands. I will contact the hospitalist to admit the patient to the hospitalist service.  Patient will be admitted to the hospitalist service ____________________________________________   FINAL CLINICAL IMPRESSION(S) / ED DIAGNOSES  Final diagnoses:  Orthostatic hypotension  Dizziness  Weakness      Loney Hering, MD 08/17/15 [phone]

## 2015-08-17 NOTE — ED Notes (Signed)
Patient returned from CT

## 2015-08-17 NOTE — ED Notes (Signed)
Patient transported to CT 

## 2015-08-17 NOTE — Care Management Obs Status (Signed)
[facility] NOTIFICATION   Patient Details  Name: Sherri Davidson MRN: [id number] Date of Birth: August 31, 1955   Medicare Observation Status Notification Given:   (letter explained to patient's daughterpresent in room)    Ival Bible, RN 08/17/2015, 9:13 AM

## 2015-08-17 NOTE — ED Notes (Signed)
Breakfast tray and sprite given to patient. Patient asking about wait time. Informed patient that we have orders in and are waiting on a bed at this time. Admitting Dr. Lu Duffel see down here or on the floor. Patient understanding at this time.

## 2015-08-17 NOTE — Progress Notes (Signed)
Notified Dr Vianne Bulls that pt experiencing multiple loose watery stools; asked Dr if she wanted to screen for c-diff; Dr stated yes to test for cdiff; pt placed on enteric precautions

## 2015-08-18 LAB — BASIC METABOLIC PANEL
Anion gap: 11 (ref 5–15)
BUN: 39 mg/dL — ABNORMAL HIGH (ref 6–20)
CALCIUM: 8.2 mg/dL — AB (ref 8.9–10.3)
CHLORIDE: 108 mmol/L (ref 101–111)
CO2: 17 mmol/L — AB (ref 22–32)
CREATININE: 5.51 mg/dL — AB (ref 0.44–1.00)
GFR calc non Af Amer: 8 mL/min — ABNORMAL LOW (ref >60)
GFR, EST AFRICAN AMERICAN: 9 mL/min — AB (ref >60)
GLUCOSE: 105 mg/dL — AB (ref 65–99)
Potassium: 4 mmol/L (ref 3.5–5.1)
Sodium: 136 mmol/L (ref 135–145)

## 2015-08-18 LAB — CBC
HCT: 25.3 % — ABNORMAL LOW (ref 35.0–47.0)
HEMOGLOBIN: 8.4 g/dL — AB (ref 12.0–16.0)
MCH: 27.1 pg (ref 26.0–34.0)
MCHC: 33 g/dL (ref 32.0–36.0)
MCV: 82.3 fL (ref 80.0–100.0)
PLATELETS: 235 10*3/uL (ref 150–440)
RBC: 3.08 MIL/uL — AB (ref 3.80–5.20)
RDW: 16.1 % — ABNORMAL HIGH (ref 11.5–14.5)
WBC: 6.6 10*3/uL (ref 3.6–11.0)

## 2015-08-18 LAB — GLUCOSE, CAPILLARY: GLUCOSE-CAPILLARY: 86 mg/dL (ref 65–99)

## 2015-08-18 MED ORDER — MUPIROCIN 2 % EX OINT
1.0000 "application " | TOPICAL_OINTMENT | Freq: Two times a day (BID) | CUTANEOUS | Status: DC
  Administered 2015-08-18: 1 via NASAL
  Filled 2015-08-18: qty 22

## 2015-08-18 MED ORDER — CHLORHEXIDINE GLUCONATE CLOTH 2 % EX PADS
6.0000 | MEDICATED_PAD | Freq: Every day | CUTANEOUS | Status: DC
  Administered 2015-08-18: 6 via TOPICAL

## 2015-08-18 NOTE — Progress Notes (Signed)
Subjective:   Peritoneal dialysis last night. Tolerated treatment well. UF of 561m.   Denies any lightheadedness of dizziness today   Objective:  Vital signs in last 24 hours:  Temp:  [97.7 F (36.5 C)-98 F (36.7 C)] 98 F (36.7 C) (03/06 0538) Pulse Rate:  [76-79] 76 (03/06 0538) Resp:  [18] 18 (03/06 0538) BP: (111-113)/(62-63) 113/62 mmHg (03/06 0538) SpO2:  [100 %] 100 % (03/06 0538)  Weight change:  Filed Weights   08/17/15 0936  Weight: 56.7 kg (125 lb)    Intake/Output:    Intake/Output Summary (Last 24 hours) at 08/18/15 1437 Last data filed at 08/18/15 0745  Gross per 24 hour  Intake      0 ml  Output    250 ml  Net   -250 ml     Physical Exam: General:  no acute distress, laying in the bed, thin, frail   HEENT  anicteric, moist oral mucous membranes   Neck  supple   Pulm/lungs  normal breathing effort, no crackles   CVS/Heart  irregular, no rub or gallop   Abdomen:   soft, nontender   Extremities:  no peripheral edema   Neurologic:  alert, able to follow commands   Skin:  no acute rashes   Access:  PD catheter        Basic Metabolic Panel:   Recent Labs Lab 08/17/15 0042 08/18/15 0506  NA 137 136  K 4.1 4.0  CL 109 108  CO2 18* 17*  GLUCOSE 143* 105*  BUN 45* 39*  CREATININE 6.47* 5.51*  CALCIUM 8.2* 8.2*     CBC:  Recent Labs Lab 08/17/15 0042 08/18/15 0506  WBC 7.6 6.6  HGB 9.4* 8.4*  HCT 28.5* 25.3*  MCV 80.8 82.3  PLT 276 235      Microbiology:  Recent Results (from the past 720 hour(s))  MRSA PCR Screening     Status: Abnormal   Collection Time: 08/17/15 12:02 PM  Result Value Ref Range Status   MRSA by PCR POSITIVE (A) NEGATIVE Final    Comment:        The GeneXpert MRSA Assay (FDA approved for NASAL specimens only), is one component of a comprehensive MRSA colonization surveillance program. It is not intended to diagnose MRSA infection nor to guide or monitor treatment for MRSA  infections. CRITICAL RESULT CALLED TO, READ BACK BY AND VERIFIED WITH: MICHAEL JACOBS AT 1340 ON 08/17/15. CTJ   C difficile quick scan w PCR reflex     Status: None   Collection Time: 08/17/15 10:15 PM  Result Value Ref Range Status   C Diff antigen NEGATIVE NEGATIVE Final   C Diff toxin NEGATIVE NEGATIVE Final   C Diff interpretation Negative for C. difficile  Final    Coagulation Studies: No results for input(s): LABPROT, INR in the last 72 hours.  Urinalysis:  Recent Labs  08/17/15 0215  COLORURINE YELLOW*  LABSPEC 1.013  PHURINE 6.0  GLUCOSEU 150*  HGBUR NEGATIVE  BILIRUBINUR NEGATIVE  KETONESUR NEGATIVE  PROTEINUR 100*  NITRITE NEGATIVE  LEUKOCYTESUR NEGATIVE      Imaging: Ct Head Wo Contrast  08/17/2015  CLINICAL DATA:  Acute onset of generalized weakness. Initial encounter. EXAM: CT HEAD WITHOUT CONTRAST TECHNIQUE: Contiguous axial images were obtained from the base of the skull through the vertex without intravenous contrast. COMPARISON:  CT of the head performed 05/23/2015, and MRI of the brain performed 05/24/2015 FINDINGS: There is no evidence of acute infarction, mass lesion, or  intra- or extra-axial hemorrhage on CT. Prominence of the ventricles and sulci reflects mild cortical volume loss. Scattered periventricular and subcortical white matter change likely reflects small vessel ischemic microangiopathy. Chronic encephalomalacia is noted at the high right posterior parietal lobe, reflecting remote infarct. Chronic ischemic change is noted at the basal ganglia bilaterally. The brainstem and fourth ventricle are within normal limits. No mass effect or midline shift is seen. There is no evidence of fracture; visualized osseous structures are unremarkable in appearance. The visualized portions of the orbits are within normal limits. The paranasal sinuses and mastoid air cells are well-aerated. No significant soft tissue abnormalities are seen. IMPRESSION: 1. No acute  intracranial pathology seen on CT. 2. Mild cortical volume loss and scattered small vessel ischemic microangiopathy. 3. Chronic encephalomalacia at the high right posterior parietal lobe, reflecting remote infarct. Chronic ischemic change at the basal ganglia bilaterally. Electronically Signed   By: Garald Balding M.D.   On: 08/17/2015 06:11     Medications:     . aspirin EC  81 mg Oral Daily  . Chlorhexidine Gluconate Cloth  6 each Topical Q0600  . citalopram  10 mg Oral Daily  . clopidogrel  75 mg Oral Daily  . dialysis solution 1.5% low-MG/low-CA   Intraperitoneal Q24H  . gentamicin cream  1 application Topical Daily  . heparin  5,000 Units Subcutaneous 3 times per day  . levETIRAcetam  500 mg Oral Daily  . levothyroxine  25 mcg Oral QAC breakfast  . mirtazapine  15 mg Oral QHS  . mupirocin ointment  1 application Nasal BID  . nicotine  21 mg Transdermal Daily  . pantoprazole  40 mg Oral Daily  . potassium chloride SA  20 mEq Oral QHS  . simvastatin  20 mg Oral Daily  . Vitamin D (Ergocalciferol)  50,000 Units Oral Q30 days   acetaminophen **OR** acetaminophen, alum & mag hydroxide-simeth, bisacodyl, ciprofloxacin, heparin, HYDROcodone-acetaminophen, hydrOXYzine, ondansetron **OR** ondansetron (ZOFRAN) IV  Assessment/ Plan:  60 y.o. female with end-stage renal disease on peritoneal dialysis, anemia, secondary hyperparathyroidism, diabetes type 2, malnutrition, history of stroke with left leg weakness, current smoker,h/o tonic-clonic seizure, CVA with left-sided weakness, history of C. difficile colitis This time presents for dizziness and slurred speech. Found to have significant orthostatic hypotension here. Patient does have some orthostatic hypotension at baseline  1. ESRD. ChronicPD/[facility]/ [address] [place]  CCPD 6 x 2000 cc   Tolerated peritoneal dialysis   2. Hypertension - Blood pressure control is acceptable at present. Not currently on blood pressure agents.   3.  Anemia of chronic kidney disease: hemoglobin 8.4 - epo as outpatient.   4. Hypokalemia: due to dialysis - oral potassium supplements  5. Metabolic acidosis: even though patient is reaching adequacy on peritoneal dialysis.  - outpatient CO2 of 21 on 07/22/15   LOS:  Ashok Sawaya 3/6/20172:37 PM

## 2015-08-18 NOTE — Progress Notes (Signed)
08/18/2015 3:43 PM  Sherri Davidson to be D/C'd Home per MD order.  Discussed prescriptions and follow up appointments with the patient. Prescriptions given to patient, medication list explained in detail. Pt verbalized understanding.    Medication List    TAKE these medications        aspirin EC 81 MG tablet  Take 81 mg by mouth every morning.     ciprofloxacin 500 MG tablet  Commonly known as:  CIPRO  Take 500 mg by mouth See admin instructions. Take 1 tablet by mouth once a day after dialysis in case of peritonitis. *Only take if directed by kidney doctor*     citalopram 10 MG tablet  Commonly known as:  CELEXA  Take 10 mg by mouth every morning.     clopidogrel 75 MG tablet  Commonly known as:  PLAVIX  Take 75 mg by mouth every morning.     hydrOXYzine 25 MG capsule  Commonly known as:  VISTARIL  Take 25 mg by mouth every morning.     levETIRAcetam 500 MG tablet  Commonly known as:  KEPPRA  Take 1 tablet (500 mg total) by mouth 2 (two) times daily.     levothyroxine 25 MCG tablet  Commonly known as:  SYNTHROID, LEVOTHROID  Take 25 mcg by mouth daily before breakfast.     mirtazapine 15 MG tablet  Commonly known as:  REMERON  Take 15 mg by mouth at bedtime.     nicotine 7 mg/24hr patch  Commonly known as:  NICODERM CQ - dosed in mg/24 hr  Place 7 mg onto the skin daily.     nicotine 21 mg/24hr patch  Commonly known as:  NICODERM CQ - dosed in mg/24 hours  Place 1 patch (21 mg total) onto the skin daily.     ondansetron 4 MG tablet  Commonly known as:  ZOFRAN  Take 1 tablet (4 mg total) by mouth every 8 (eight) hours as needed for nausea or vomiting.     oxyCODONE-acetaminophen 5-325 MG tablet  Commonly known as:  PERCOCET/ROXICET  Take 1 tablet by mouth every 6 (six) hours as needed. For pain.     pantoprazole 40 MG tablet  Commonly known as:  PROTONIX  Take 40 mg by mouth every morning.     potassium chloride SA 20 MEQ tablet  Commonly known as:   K-DUR,KLOR-CON  Take 20 mEq by mouth at bedtime.     promethazine 25 MG tablet  Commonly known as:  PHENERGAN  Take 25 mg by mouth every 8 (eight) hours as needed for nausea or vomiting.     simvastatin 20 MG tablet  Commonly known as:  ZOCOR  Take 20 mg by mouth at bedtime.     Vitamin D (Ergocalciferol) 50000 units Caps capsule  Commonly known as:  DRISDOL  Take 50,000 Units by mouth every 30 (thirty) days.        Filed Vitals:   08/17/15 2017 08/18/15 0538  BP: 111/63 113/62  Pulse: 79 76  Temp: 97.7 F (36.5 C) 98 F (36.7 C)  Resp: 18 18    Skin clean, dry and intact without evidence of skin break down, no evidence of skin tears noted. IV catheter discontinued intact. Site without signs and symptoms of complications. Dressing and pressure applied. Pt denies pain at this time. No complaints noted.  An After Visit Summary was printed and given to the patient. Patient escorted via [place], and D/C home via private auto.  Dola Argyle

## 2015-08-18 NOTE — Discharge Summary (Signed)
Sherri Davidson, is a 60 y.o. female  DOB December 18, 1955  MRN [id number].  Admission date:  08/17/2015  Admitting Physician  Epifanio Lesches, MD  Discharge Date:  08/18/2015   Primary MD  [facility]  Recommendations for primary care physician for things to follow:  Follow up with physician at [facility]  in one week.   Admission Diagnosis  Orthostatic hypotension [I95.1] Dizziness [R42] Weakness [R53.1]   Discharge Diagnosis  Orthostatic hypotension [I95.1] Dizziness [R42] Weakness [R53.1]    Active Problems:   Vomiting      Past Medical History  Diagnosis Date  . CVA, old, hemiparesis ([place])   . Hypertension   . Diabetes ([place])   . ESRD on peritoneal dialysis ([place])   . CHF (congestive heart failure) ([place])   . Hypothyroidism     Past Surgical History  Procedure Laterality Date  . Cholecystectomy    . Amputation toe    . Insertion of dialysis catheter         History of present illness and  Hospital Course:     Kindly see H&P for history of present illness and admission details, please review complete Labs, Consult reports and Test reports for all details in brief  HPI  from the history and physical done on the day of admission  60 yr old female admitted yersteday for dizziness/orthostatic hypotension.  Hospital Course  1.dizziness due to orthostatic hypotension;resolved.had diarrhea but stool for  Cdiff is negative.aslo had some slurred speech but  CT head was unremarkable, Sepsis work also is negative.so we discharged her home. 2.ESRD;on peritoneal dialysis.site is clean ,follows up with DR. Harmeet Singh. 3.MULTIINFACRT DEMENTIA;stable. 4.AOCD:;stable 5.h/o tonic clonic seizure;follows up with DR.Potter,rescently  Changed toKeppra '500mg'$  daily,and no dilantin, Pt has  orthostatic hypotension at baseline  Discharge Condition: stable   Follow UP  Follow-up Information    Follow up with [facility] In 1 week.   Contact information:   [address] [place] [address] [phone]         Discharge Instructions  and  Discharge Medications       Medication List    TAKE these medications        aspirin EC 81 MG tablet  Take 81 mg by mouth every morning.     ciprofloxacin 500 MG tablet  Commonly known as:  CIPRO  Take 500 mg by mouth See admin instructions. Take 1 tablet by mouth once a day after dialysis in case of peritonitis. *Only take if directed by kidney doctor*     citalopram 10 MG tablet  Commonly known as:  CELEXA  Take 10 mg by mouth every morning.     clopidogrel 75 MG tablet  Commonly known as:  PLAVIX  Take 75 mg by mouth every morning.     hydrOXYzine 25 MG capsule  Commonly known as:  VISTARIL  Take 25 mg by mouth every morning.     levETIRAcetam 500 MG tablet  Commonly known as:  KEPPRA  Take 1 tablet (500 mg total) by mouth 2 (two) times daily.     levothyroxine 25 MCG tablet  Commonly known as:  SYNTHROID, LEVOTHROID  Take 25 mcg by mouth daily before breakfast.     mirtazapine 15 MG tablet  Commonly known as:  REMERON  Take 15 mg by mouth at bedtime.     nicotine 7 mg/24hr patch  Commonly known as:  NICODERM CQ - dosed in mg/24 hr  Place 7 mg onto the skin daily.     nicotine 21 mg/24hr patch  Commonly known as:  NICODERM CQ - dosed in mg/24 hours  Place 1 patch (21 mg total) onto the skin daily.     ondansetron 4 MG tablet  Commonly known as:  ZOFRAN  Take 1 tablet (4 mg total) by mouth every 8 (eight) hours as needed for nausea or vomiting.     oxyCODONE-acetaminophen 5-325 MG tablet  Commonly known as:  PERCOCET/ROXICET  Take 1 tablet by mouth every 6 (six) hours as needed. For pain.     pantoprazole 40 MG tablet  Commonly known as:  PROTONIX  Take 40 mg by mouth  every morning.     potassium chloride SA 20 MEQ tablet  Commonly known as:  K-DUR,KLOR-CON  Take 20 mEq by mouth at bedtime.     promethazine 25 MG tablet  Commonly known as:  PHENERGAN  Take 25 mg by mouth every 8 (eight) hours as needed for nausea or vomiting.     simvastatin 20 MG tablet  Commonly known as:  ZOCOR  Take 20 mg by mouth at bedtime.     Vitamin D (Ergocalciferol) 50000 units Caps capsule  Commonly known as:  DRISDOL  Take 50,000 Units by mouth every 30 (thirty) days.          Diet and Activity recommendation: See Discharge Instructions above   Consults obtained - nephrology   Major procedures and Radiology Reports - PLEASE review detailed and final reports for all details, in brief -     Ct Head Wo Contrast  08/17/2015  CLINICAL DATA:  Acute onset of generalized weakness. Initial encounter. EXAM: CT HEAD WITHOUT CONTRAST TECHNIQUE: Contiguous axial images were obtained from the base of the skull through the vertex without intravenous contrast. COMPARISON:  CT of the head performed 05/23/2015, and MRI of the brain performed 05/24/2015 FINDINGS: There is no evidence of acute infarction, mass lesion, or intra- or extra-axial hemorrhage on CT. Prominence of the ventricles and sulci reflects mild cortical volume loss. Scattered periventricular and subcortical white matter change likely reflects small vessel ischemic microangiopathy. Chronic encephalomalacia is noted at the high right posterior parietal lobe, reflecting remote infarct. Chronic ischemic change is noted at the basal ganglia bilaterally. The brainstem and fourth ventricle are within normal limits. No mass effect or midline shift is seen. There is no evidence of fracture; visualized osseous structures are unremarkable in appearance. The visualized portions of the orbits are within normal limits. The paranasal sinuses and mastoid air cells are well-aerated. No significant soft tissue abnormalities are seen.  IMPRESSION: 1. No acute intracranial pathology seen on CT. 2. Mild cortical volume loss and scattered small vessel ischemic microangiopathy. 3. Chronic encephalomalacia at the high right posterior parietal lobe, reflecting remote infarct. Chronic ischemic change at the basal ganglia bilaterally. Electronically Signed   By: Garald Balding M.D.   On: 08/17/2015 06:11    Micro Results    Recent Results (from the past 240 hour(s))  MRSA PCR Screening     Status: Abnormal   Collection Time: 08/17/15 12:02 PM  Result Value Ref Range Status   MRSA by PCR POSITIVE (A) NEGATIVE Final    Comment:        The GeneXpert MRSA Assay (FDA approved for NASAL specimens only), is one component of a comprehensive MRSA colonization surveillance program. It is not intended to diagnose MRSA infection nor to guide or monitor treatment for MRSA infections. CRITICAL  RESULT CALLED TO, READ BACK BY AND VERIFIED WITH: MICHAEL JACOBS AT 1340 ON 08/17/15. CTJ   C difficile quick scan w PCR reflex     Status: None   Collection Time: 08/17/15 10:15 PM  Result Value Ref Range Status   C Diff antigen NEGATIVE NEGATIVE Final   C Diff toxin NEGATIVE NEGATIVE Final   C Diff interpretation Negative for C. difficile  Final       Today   Subjective:   Damesha Lenart today has no headache,no chest abdominal pain,no new weakness tingling or numbness, feels much better wants to go home today.   Objective:   Blood pressure 113/62, pulse 76, temperature 98 F (36.7 C), temperature source Oral, resp. rate 18, height '5\' 5"'$  (1.651 m), weight 56.7 kg (125 lb), SpO2 100 %.   Intake/Output Summary (Last 24 hours) at 08/18/15 1719 Last data filed at 08/18/15 0745  Gross per 24 hour  Intake      0 ml  Output    250 ml  Net   -250 ml    Exam Awake Alert, Oriented x 3, No new F.N deficits, Normal affect [place].AT,PERRAL Supple Neck,No JVD, No cervical lymphadenopathy appriciated.  Symmetrical Chest wall movement, Good air  movement bilaterally, CTAB RRR,No Gallops,Rubs or new Murmurs, No Parasternal Heave +ve B.Sounds, Abd Soft, Non tender, No organomegaly appriciated, No rebound -guarding or rigidity. No Cyanosis, Clubbing or edema, No new Rash or bruise  Data Review   CBC w Diff: Lab Results  Component Value Date   WBC 6.6 08/18/2015   WBC 17.4* 05/31/2014   HGB 8.4* 08/18/2015   HGB 11.3* 05/31/2014   HCT 25.3* 08/18/2015   HCT 35.0 05/31/2014   PLT 235 08/18/2015   PLT 282 05/31/2014   LYMPHOPCT 7 05/24/2015   LYMPHOPCT 11.9 05/31/2014   MONOPCT 4 05/24/2015   MONOPCT 5.1 05/31/2014   EOSPCT 0 05/24/2015   EOSPCT 0.0 05/31/2014   BASOPCT 0 05/24/2015   BASOPCT 0.2 05/31/2014    CMP: Lab Results  Component Value Date   NA 136 08/18/2015   NA 135* 05/31/2014   K 4.0 08/18/2015   K 3.0* 05/31/2014   CL 108 08/18/2015   CL 98 05/31/2014   CO2 17* 08/18/2015   CO2 21 05/31/2014   BUN 39* 08/18/2015   BUN 35* 05/31/2014   CREATININE 5.51* 08/18/2015   CREATININE 5.19* 05/31/2014   PROT 6.0* 08/17/2015   PROT 8.0 05/30/2014   ALBUMIN 2.0* 08/17/2015   ALBUMIN 2.6* 05/30/2014   BILITOT 0.4 08/17/2015   BILITOT 0.5 05/30/2014   ALKPHOS 142* 08/17/2015   ALKPHOS 233* 05/30/2014   AST 26 08/17/2015   AST 45* 05/30/2014   ALT 10* 08/17/2015   ALT 37 05/30/2014  .   Total Time in preparing paper work, data evaluation and todays exam - 77 minutes  Kenna Kirn M.D on 08/18/2015 at 5:19 PM    Note: This dictation was prepared with Dragon dictation along with smaller phrase technology. Any transcriptional errors that result from this process are unintentional.

## 2015-09-12 ENCOUNTER — Emergency Department: Payer: Medicare (Managed Care)

## 2015-09-12 ENCOUNTER — Emergency Department
Admission: EM | Admit: 2015-09-12 | Discharge: 2015-09-12 | Disposition: A | Discharge status: Home / Self Care | Payer: Medicare (Managed Care) | Attending: Emergency Medicine | Admitting: Emergency Medicine

## 2015-09-12 DIAGNOSIS — Z87891 Personal history of nicotine dependence: Secondary | ICD-10-CM | POA: Diagnosis not present

## 2015-09-12 DIAGNOSIS — E039 Hypothyroidism, unspecified: Secondary | ICD-10-CM | POA: Diagnosis not present

## 2015-09-12 DIAGNOSIS — Z8673 Personal history of transient ischemic attack (TIA), and cerebral infarction without residual deficits: Secondary | ICD-10-CM | POA: Diagnosis not present

## 2015-09-12 DIAGNOSIS — Z7982 Long term (current) use of aspirin: Secondary | ICD-10-CM | POA: Insufficient documentation

## 2015-09-12 DIAGNOSIS — J189 Pneumonia, unspecified organism: Secondary | ICD-10-CM | POA: Insufficient documentation

## 2015-09-12 DIAGNOSIS — I132 Hypertensive heart and chronic kidney disease with heart failure and with stage 5 chronic kidney disease, or end stage renal disease: Secondary | ICD-10-CM | POA: Diagnosis not present

## 2015-09-12 DIAGNOSIS — Z79899 Other long term (current) drug therapy: Secondary | ICD-10-CM | POA: Insufficient documentation

## 2015-09-12 DIAGNOSIS — I509 Heart failure, unspecified: Secondary | ICD-10-CM | POA: Insufficient documentation

## 2015-09-12 DIAGNOSIS — E119 Type 2 diabetes mellitus without complications: Secondary | ICD-10-CM | POA: Diagnosis not present

## 2015-09-12 DIAGNOSIS — N186 End stage renal disease: Secondary | ICD-10-CM | POA: Insufficient documentation

## 2015-09-12 DIAGNOSIS — R55 Syncope and collapse: Secondary | ICD-10-CM | POA: Diagnosis not present

## 2015-09-12 DIAGNOSIS — Z792 Long term (current) use of antibiotics: Secondary | ICD-10-CM | POA: Insufficient documentation

## 2015-09-12 LAB — TROPONIN I: Troponin I: <0.03 ng/mL (ref <0.031)

## 2015-09-12 LAB — URINALYSIS COMPLETE WITH MICROSCOPIC (ARMC ONLY)
Bacteria, UA: NONE SEEN
Bilirubin Urine: NEGATIVE
GLUCOSE, UA: 150 mg/dL — AB
Hgb urine dipstick: NEGATIVE
Ketones, ur: NEGATIVE mg/dL
Nitrite: NEGATIVE
PROTEIN: 100 mg/dL — AB
RBC / HPF: NONE SEEN RBC/hpf (ref 0–5)
Specific Gravity, Urine: 1.009 (ref 1.005–1.030)
pH: 6 (ref 5.0–8.0)

## 2015-09-12 LAB — CBC WITH DIFFERENTIAL/PLATELET
BASOS ABS: 0.1 10*3/uL (ref 0–0.1)
BASOS PCT: 1 %
Eosinophils Absolute: 0.2 10*3/uL (ref 0–0.7)
Eosinophils Relative: 2 %
HEMATOCRIT: 28 % — AB (ref 35.0–47.0)
HEMOGLOBIN: 8.9 g/dL — AB (ref 12.0–16.0)
Lymphocytes Relative: 23 %
Lymphs Abs: 2.1 10*3/uL (ref 1.0–3.6)
MCH: 23.9 pg — ABNORMAL LOW (ref 26.0–34.0)
MCHC: 32 g/dL (ref 32.0–36.0)
MCV: 74.9 fL — ABNORMAL LOW (ref 80.0–100.0)
Monocytes Absolute: 0.9 10*3/uL (ref 0.2–0.9)
Monocytes Relative: 10 %
NEUTROS ABS: 5.8 10*3/uL (ref 1.4–6.5)
NEUTROS PCT: 64 %
Platelets: 282 10*3/uL (ref 150–440)
RBC: 3.73 MIL/uL — ABNORMAL LOW (ref 3.80–5.20)
RDW: 19.6 % — AB (ref 11.5–14.5)
WBC: 9.1 10*3/uL (ref 3.6–11.0)

## 2015-09-12 LAB — BASIC METABOLIC PANEL
Anion gap: 5 (ref 5–15)
BUN: 24 mg/dL — AB (ref 6–20)
CALCIUM: 6.8 mg/dL — AB (ref 8.9–10.3)
CHLORIDE: 106 mmol/L (ref 101–111)
CO2: 20 mmol/L — AB (ref 22–32)
CREATININE: 4.59 mg/dL — AB (ref 0.44–1.00)
GFR calc non Af Amer: 10 mL/min — ABNORMAL LOW (ref >60)
GFR, EST AFRICAN AMERICAN: 11 mL/min — AB (ref >60)
Glucose, Bld: 89 mg/dL (ref 65–99)
Potassium: 3.4 mmol/L — ABNORMAL LOW (ref 3.5–5.1)
SODIUM: 131 mmol/L — AB (ref 135–145)

## 2015-09-12 MED ORDER — LEVOFLOXACIN 500 MG PO TABS: ORAL_TABLET | ORAL | Status: AC

## 2015-09-12 MED ORDER — LEVOFLOXACIN 750 MG PO TABS
750.0000 mg | ORAL_TABLET | Freq: Once | ORAL | Status: AC
  Administered 2015-09-12: 750 mg via ORAL
  Filled 2015-09-12: qty 1

## 2015-09-12 NOTE — ED Notes (Signed)
Patient transported to CT 

## 2015-09-12 NOTE — ED Notes (Signed)
Pt refusing Chest XR, EDP made aware and order cancelled

## 2015-09-12 NOTE — ED Provider Notes (Addendum)
[facility] Emergency Department Provider Note  ____________________________________________  Time seen:  Seen upon arrival to the emergency department  I have reviewed the triage vital signs and the nursing notes.   HISTORY  Chief Complaint Hypotension    HPI Sherri Davidson is a 60 y.o. female with a history of CVA, hypertension, diabetes and stage renal disease on peritoneal dialysis who is presenting to the emergency department today with a near syncopal episode. Per EMS she was still very confused when they arrived on scene. There was no reported loss of consciousness. The patient denies any pain. She says that she has not had a lot to drink or eat today. She does report a baseline left-sided weakness since an old stroke. She reports compliance with her peritoneal dialysis. No seizure activity reported. The patient says that she urinates about 3-4 times per day.Prior to arrival the patient was also found, by EMS, to be hypotensive to the 80s. The family reports that her normal blood pressures in the 443X and 540G systolic. Altered mental status is now resolved upon arrival to the emergency department.   Past Medical History  Diagnosis Date  . CVA, old, hemiparesis ([place])   . Hypertension   . Diabetes ([place])   . ESRD on peritoneal dialysis ([place])   . CHF (congestive heart failure) ([place])   . Hypothyroidism     Patient Active Problem List   Diagnosis Date Noted  . Vomiting 08/17/2015  . Seizure ([place]) 05/24/2015  . Community acquired pneumonia 05/24/2015  . Pressure ulcer 05/24/2015    Past Surgical History  Procedure Laterality Date  . Cholecystectomy    . Amputation toe    . Insertion of dialysis catheter      Current Outpatient Rx  Name  Route  Sig  Dispense  Refill  . aspirin EC 81 MG tablet   Oral   Take 81 mg by mouth every morning.          . ciprofloxacin (CIPRO) 500 MG tablet   Oral   Take 500 mg by mouth See admin instructions. Take  1 tablet by mouth once a day after dialysis in case of peritonitis. *Only take if directed by kidney doctor*         . citalopram (CELEXA) 10 MG tablet   Oral   Take 10 mg by mouth every morning.          . clopidogrel (PLAVIX) 75 MG tablet   Oral   Take 75 mg by mouth every morning.          . hydrOXYzine (VISTARIL) 25 MG capsule   Oral   Take 25 mg by mouth every morning.          . levETIRAcetam (KEPPRA) 500 MG tablet   Oral   Take 1 tablet (500 mg total) by mouth 2 (two) times daily. Patient taking differently: Take 500 mg by mouth daily.    60 tablet   0   . levothyroxine (SYNTHROID, LEVOTHROID) 25 MCG tablet   Oral   Take 25 mcg by mouth daily before breakfast.         . mirtazapine (REMERON) 15 MG tablet   Oral   Take 15 mg by mouth at bedtime.         . nicotine (NICODERM CQ - DOSED IN MG/24 HOURS) 21 mg/24hr patch   Transdermal   Place 1 patch (21 mg total) onto the skin daily.   28 patch   0   .  nicotine (NICODERM CQ - DOSED IN MG/24 HR) 7 mg/24hr patch   Transdermal   Place 7 mg onto the skin daily.         . ondansetron (ZOFRAN) 4 MG tablet   Oral   Take 1 tablet (4 mg total) by mouth every 8 (eight) hours as needed for nausea or vomiting.   20 tablet   0   . oxyCODONE-acetaminophen (PERCOCET/ROXICET) 5-325 MG tablet   Oral   Take 1 tablet by mouth every 6 (six) hours as needed. For pain.         . pantoprazole (PROTONIX) 40 MG tablet   Oral   Take 40 mg by mouth every morning.          . potassium chloride SA (K-DUR,KLOR-CON) 20 MEQ tablet   Oral   Take 20 mEq by mouth at bedtime.         . promethazine (PHENERGAN) 25 MG tablet   Oral   Take 25 mg by mouth every 8 (eight) hours as needed for nausea or vomiting.         . simvastatin (ZOCOR) 20 MG tablet   Oral   Take 20 mg by mouth at bedtime.          . Vitamin D, Ergocalciferol, (DRISDOL) 50000 UNITS CAPS capsule   Oral   Take 50,000 Units by mouth every 30  (thirty) days.           Allergies Salicylates and Asa  No family history on file.  Social History Social History  Substance Use Topics  . Smoking status: Former Smoker -- 1.00 packs/day for 30 years    Types: Cigarettes  . Smokeless tobacco: None  . Alcohol Use: No    Review of Systems Constitutional: No fever/chills Eyes: No visual changes. ENT: No sore throat. Cardiovascular: Denies chest pain. Respiratory: Denies shortness of breath. Gastrointestinal: No abdominal pain.  No nausea, no vomiting.  No diarrhea.  No constipation. Genitourinary: Negative for dysuria. Musculoskeletal: Negative for back pain. Skin: Negative for rash. Neurological: Negative for headaches, chronic left-sided weakness. or numbness.  10-point ROS otherwise negative.  ____________________________________________   PHYSICAL EXAM:  VITAL SIGNS: ED Triage Vitals  Enc Vitals Group     BP --      Pulse --      Resp --      Temp 09/12/15 1620 98.1 F (36.7 C)     Temp src --      SpO2 --      Weight --      Height --      Head Cir --      Peak Flow --      Pain Score --      Pain Loc --      Pain Edu? --      Excl. in [place]? --     Constitutional: Alert and oriented. Well appearing and in no acute distress. Eyes: Conjunctivae are normal. PERRL. EOMI. Head: Atraumatic. Nose: No congestion/rhinnorhea. Mouth/Throat: Mucous membranes are moist.   Neck: No stridor.   Cardiovascular: Normal rate, regular rhythm. Grossly normal heart sounds.  Good peripheral circulation. Respiratory: Normal respiratory effort.  No retractions. Lungs CTAB. Gastrointestinal: Soft and nontender. No distention. No CVA tenderness.Inferior peritoneal dialysis port without any tenderness, surrounding erythema or displacement. Musculoskeletal: No lower extremity tenderness nor edema.  No joint effusions. Neurologic:  Normal speech and language. Left upper extremity with 4 out of 5 strength on extension. Patient  says  this is her baseline. Skin:  Skin is warm, dry and intact. No rash noted. Psychiatric: Mood and affect are normal. Speech and behavior are normal.  ____________________________________________   LABS (all labs ordered are listed, but only abnormal results are displayed)  Labs Reviewed  CBC WITH DIFFERENTIAL/PLATELET - Abnormal; Notable for the following:    RBC 3.73 (*)    Hemoglobin 8.9 (*)    HCT 28.0 (*)    MCV 74.9 (*)    MCH 23.9 (*)    RDW 19.6 (*)    All other components within normal limits  BASIC METABOLIC PANEL - Abnormal; Notable for the following:    Sodium 131 (*)    Potassium 3.4 (*)    CO2 20 (*)    BUN 24 (*)    Creatinine, Ser 4.59 (*)    Calcium 6.8 (*)    GFR calc non Af Amer 10 (*)    GFR calc Af Amer 11 (*)    All other components within normal limits  URINALYSIS COMPLETEWITH MICROSCOPIC ([facility] ONLY) - Abnormal; Notable for the following:    Color, Urine YELLOW (*)    APPearance HAZY (*)    Glucose, UA 150 (*)    Protein, ur 100 (*)    Leukocytes, UA TRACE (*)    Squamous Epithelial / LPF 0-5 (*)    All other components within normal limits  URINE CULTURE  TROPONIN I  TROPONIN I   ____________________________________________  EKG  ED ECG REPORT I, Doran Stabler, the attending physician, personally viewed and interpreted this ECG.   Date: 09/12/2015  EKG Time: 1617  Rate: 89  Rhythm: normal sinus rhythm  Axis: Normal  Intervals:none  ST&T Change: No ST segment elevation or depression. T-wave inversions in V3 through 5. Poor baseline. Unable to be imaging her EKG from March 5. However, T-wave flattening charted in multiple leads.  ____________________________________________  RADIOLOGY  Imaging Results       DG Chest 2 View (Final result) Result time: 09/12/15 18:09:32   Final result by Rad Results In Interface (09/12/15 18:09:32)   Narrative:   CLINICAL DATA: Altered mental status  EXAM: CHEST 2  VIEW  COMPARISON: 05/26/2015 chest radiograph.  FINDINGS: Stable cardiomediastinal silhouette with normal heart size. No pneumothorax. Small to moderate right pleural effusion. No left pleural effusion. Patchy opacity at the right lung base. No pulmonary edema. Cholecystectomy clips are seen in the right upper quadrant of the abdomen.  IMPRESSION: 1. Small to moderate right pleural effusion. 2. Patchy right lung base opacity, nonspecific, likely atelectasis, cannot exclude a component of pneumonia.   Electronically Signed By: Ilona Sorrel M.D. On: 09/12/2015 18:09          CT Head Wo Contrast (Final result) Result time: 09/12/15 17:59:15   Final result by Rad Results In Interface (09/12/15 17:59:15)   Narrative:   CLINICAL DATA: Weakness and confusion, not acting RIGHT, blood pressure in 80s on arrival ED, history stroke, hypertension, diabetes mellitus, CHF, end-stage renal disease on peritoneal dialysis, former smoker  EXAM: CT HEAD WITHOUT CONTRAST  TECHNIQUE: Contiguous axial images were obtained from the base of the skull through the vertex without intravenous contrast.  COMPARISON: 08/17/2015  FINDINGS: Generalized atrophy.  Stable ventricular morphology with mild ex vacuo dilatation of the RIGHT lateral ventricle.  No midline shift or mass effect.  Small vessel chronic ischemic changes of deep cerebral white matter.  Old lacunar infarct RIGHT basal ganglia.  Old RIGHT parietal infarct.  No intracranial hemorrhage, mass lesion, or evidence acute infarction.  No extra-axial fluid collections.  Visualized paranasal sinuses and mastoid air cells clear.  Bones demineralized.  Atherosclerotic calcifications at skullbase.  IMPRESSION: Atrophy with small vessel chronic ischemic changes of deep cerebral white matter.  Old RIGHT parietal and RIGHT basal ganglia infarcts.  No acute intracranial abnormalities.   Electronically  Signed By: Lavonia Dana M.D. On: 09/12/2015 17:59    ____________________________________________   PROCEDURES    ____________________________________________   INITIAL IMPRESSION / ASSESSMENT AND PLAN / ED COURSE  Pertinent labs & imaging results that were available during my care of the patient were reviewed by me and considered in my medical decision making (see chart for details).  ----------------------------------------- 5:26 PM on 09/12/2015 -----------------------------------------  Patient's sisters now the bedside and saying that the patient has been more agitated today. She says that during the episode, which lasted several minutes, she was looking in her hands and acting overall strangely. Says that the patient is now acting at her baseline mental status. Says the patient has been having intermittent episodes of acting strangely with agitation all day today. She says ever since coming over the hospital several weeks ago the patient has not been acting like herself. The sister says that her blood pressure has been intermittently low. She says that this happens especially when the patient does not eat and drink. The sister says that she is having to force to the patient at home and despite being uninsured protein supplementation the patient is only eating a small amount. She says the patient is also having a decrease in her bili level of functioning such as bowel incontinence.  ----------------------------------------- 9:48 PM on 09/12/2015 -----------------------------------------  Patient with blood pressures which have held all above 161 systolic. No further episodes of staring or near syncope. placed on the patient. We'll treat possible pneumonia with Levaquin. Discussed this treatment plan with the patient as well as the family who understand that it will be an every other day dosing of the medication starting this Sunday and will consist 2 total doses at home.  [place] a planter when to comply. The patient will follow-up with her primary care doctor at [facility] starting this Monday. There has also been a family discussion about hospice. The patient has been on a prolonged course of peritoneal dialysis and has been unable to go into a long-term care facility secondary to many long-term care facilities not being able to support a PD patient per the sister. The sister says that they'll be having a long family discussion and then relatively coming from out of town this Sunday to discuss goals of care. ____________________________________________   FINAL CLINICAL IMPRESSION(S) / ED DIAGNOSES  Near-syncope. Pneumonia.    Orbie Pyo, MD 09/12/15 2149  Possible dehydration versus failure to thrive as cause for the hypotension. The sister knows to encourage the patient to drink as well as eat solids at home although she says this has been difficult.  Orbie Pyo, MD 09/12/15 2152 Less likely as hypotension as the cause of sepsis.  Orbie Pyo, MD 09/12/15 2152

## 2015-09-12 NOTE — ED Notes (Signed)
Pt from [facility] via EMS with C/O weakness and confusion. Family stated pt wasn't acting right. BP in the 80s upon EMS arrival

## 2015-09-15 ENCOUNTER — Other Ambulatory Visit: Payer: Self-pay | Admitting: Family

## 2015-09-15 DIAGNOSIS — R9389 Abnormal findings on diagnostic imaging of other specified body structures: Secondary | ICD-10-CM

## 2015-09-15 LAB — URINE CULTURE

## 2015-09-23 ENCOUNTER — Ambulatory Visit
Admission: RE | Admit: 2015-09-23 | Discharge: 2015-09-23 | Disposition: A | Discharge status: Home / Self Care | Payer: Medicare (Managed Care) | Source: Ambulatory Visit | Attending: Family | Admitting: Family

## 2015-09-23 DIAGNOSIS — I2510 Atherosclerotic heart disease of native coronary artery without angina pectoris: Secondary | ICD-10-CM | POA: Diagnosis not present

## 2015-09-23 DIAGNOSIS — M5124 Other intervertebral disc displacement, thoracic region: Secondary | ICD-10-CM | POA: Diagnosis not present

## 2015-09-23 DIAGNOSIS — J90 Pleural effusion, not elsewhere classified: Secondary | ICD-10-CM | POA: Insufficient documentation

## 2015-09-23 DIAGNOSIS — R918 Other nonspecific abnormal finding of lung field: Secondary | ICD-10-CM | POA: Insufficient documentation

## 2015-09-23 DIAGNOSIS — R9389 Abnormal findings on diagnostic imaging of other specified body structures: Secondary | ICD-10-CM

## 2015-11-08 ENCOUNTER — Encounter (HOSPITAL_COMMUNITY): Payer: Self-pay | Admitting: Emergency Medicine

## 2015-11-08 ENCOUNTER — Emergency Department (HOSPITAL_COMMUNITY): Payer: Medicare (Managed Care)

## 2015-11-08 ENCOUNTER — Inpatient Hospital Stay (HOSPITAL_COMMUNITY)
Admission: EM | Admit: 2015-11-08 | Discharge: 2015-11-21 | DRG: 100 | Disposition: A | Discharge status: Home / Self Care | Payer: Medicare (Managed Care) | Attending: Internal Medicine | Admitting: Internal Medicine

## 2015-11-08 DIAGNOSIS — Z681 Body mass index (BMI) 19 or less, adult: Secondary | ICD-10-CM

## 2015-11-08 DIAGNOSIS — Z87891 Personal history of nicotine dependence: Secondary | ICD-10-CM | POA: Diagnosis not present

## 2015-11-08 DIAGNOSIS — R69 Illness, unspecified: Secondary | ICD-10-CM | POA: Diagnosis not present

## 2015-11-08 DIAGNOSIS — G8384 Todd's paralysis (postepileptic): Secondary | ICD-10-CM

## 2015-11-08 DIAGNOSIS — K625 Hemorrhage of anus and rectum: Secondary | ICD-10-CM | POA: Diagnosis present

## 2015-11-08 DIAGNOSIS — Z7982 Long term (current) use of aspirin: Secondary | ICD-10-CM

## 2015-11-08 DIAGNOSIS — E213 Hyperparathyroidism, unspecified: Secondary | ICD-10-CM | POA: Diagnosis present

## 2015-11-08 DIAGNOSIS — R05 Cough: Secondary | ICD-10-CM

## 2015-11-08 DIAGNOSIS — L8990 Pressure ulcer of unspecified site, unspecified stage: Secondary | ICD-10-CM | POA: Diagnosis present

## 2015-11-08 DIAGNOSIS — R1310 Dysphagia, unspecified: Secondary | ICD-10-CM | POA: Diagnosis present

## 2015-11-08 DIAGNOSIS — E118 Type 2 diabetes mellitus with unspecified complications: Secondary | ICD-10-CM | POA: Diagnosis not present

## 2015-11-08 DIAGNOSIS — D631 Anemia in chronic kidney disease: Secondary | ICD-10-CM | POA: Diagnosis present

## 2015-11-08 DIAGNOSIS — Z7189 Other specified counseling: Secondary | ICD-10-CM | POA: Diagnosis present

## 2015-11-08 DIAGNOSIS — Z8711 Personal history of peptic ulcer disease: Secondary | ICD-10-CM

## 2015-11-08 DIAGNOSIS — E875 Hyperkalemia: Secondary | ICD-10-CM | POA: Diagnosis not present

## 2015-11-08 DIAGNOSIS — E1122 Type 2 diabetes mellitus with diabetic chronic kidney disease: Secondary | ICD-10-CM | POA: Diagnosis present

## 2015-11-08 DIAGNOSIS — Z992 Dependence on renal dialysis: Secondary | ICD-10-CM

## 2015-11-08 DIAGNOSIS — I69359 Hemiplegia and hemiparesis following cerebral infarction affecting unspecified side: Secondary | ICD-10-CM | POA: Diagnosis not present

## 2015-11-08 DIAGNOSIS — E43 Unspecified severe protein-calorie malnutrition: Secondary | ICD-10-CM | POA: Diagnosis present

## 2015-11-08 DIAGNOSIS — E876 Hypokalemia: Secondary | ICD-10-CM | POA: Diagnosis not present

## 2015-11-08 DIAGNOSIS — G40901 Epilepsy, unspecified, not intractable, with status epilepticus: Secondary | ICD-10-CM | POA: Diagnosis present

## 2015-11-08 DIAGNOSIS — Z515 Encounter for palliative care: Secondary | ICD-10-CM | POA: Diagnosis not present

## 2015-11-08 DIAGNOSIS — N390 Urinary tract infection, site not specified: Secondary | ICD-10-CM | POA: Diagnosis present

## 2015-11-08 DIAGNOSIS — I9589 Other hypotension: Secondary | ICD-10-CM | POA: Diagnosis present

## 2015-11-08 DIAGNOSIS — I639 Cerebral infarction, unspecified: Secondary | ICD-10-CM

## 2015-11-08 DIAGNOSIS — I4581 Long QT syndrome: Secondary | ICD-10-CM | POA: Diagnosis present

## 2015-11-08 DIAGNOSIS — I132 Hypertensive heart and chronic kidney disease with heart failure and with stage 5 chronic kidney disease, or end stage renal disease: Secondary | ICD-10-CM | POA: Diagnosis present

## 2015-11-08 DIAGNOSIS — Z91048 Other nonmedicinal substance allergy status: Secondary | ICD-10-CM

## 2015-11-08 DIAGNOSIS — Z79899 Other long term (current) drug therapy: Secondary | ICD-10-CM

## 2015-11-08 DIAGNOSIS — R441 Visual hallucinations: Secondary | ICD-10-CM | POA: Diagnosis not present

## 2015-11-08 DIAGNOSIS — E039 Hypothyroidism, unspecified: Secondary | ICD-10-CM | POA: Diagnosis present

## 2015-11-08 DIAGNOSIS — I998 Other disorder of circulatory system: Secondary | ICD-10-CM | POA: Diagnosis not present

## 2015-11-08 DIAGNOSIS — K920 Hematemesis: Secondary | ICD-10-CM | POA: Diagnosis not present

## 2015-11-08 DIAGNOSIS — G9340 Encephalopathy, unspecified: Secondary | ICD-10-CM | POA: Diagnosis present

## 2015-11-08 DIAGNOSIS — R059 Cough, unspecified: Secondary | ICD-10-CM

## 2015-11-08 DIAGNOSIS — I7090 Unspecified atherosclerosis: Secondary | ICD-10-CM

## 2015-11-08 DIAGNOSIS — R443 Hallucinations, unspecified: Secondary | ICD-10-CM | POA: Diagnosis not present

## 2015-11-08 DIAGNOSIS — R569 Unspecified convulsions: Secondary | ICD-10-CM

## 2015-11-08 DIAGNOSIS — R64 Cachexia: Secondary | ICD-10-CM | POA: Diagnosis present

## 2015-11-08 DIAGNOSIS — I749 Embolism and thrombosis of unspecified artery: Secondary | ICD-10-CM | POA: Diagnosis not present

## 2015-11-08 DIAGNOSIS — Z886 Allergy status to analgesic agent status: Secondary | ICD-10-CM

## 2015-11-08 DIAGNOSIS — I82890 Acute embolism and thrombosis of other specified veins: Secondary | ICD-10-CM | POA: Diagnosis present

## 2015-11-08 DIAGNOSIS — I5032 Chronic diastolic (congestive) heart failure: Secondary | ICD-10-CM | POA: Diagnosis present

## 2015-11-08 DIAGNOSIS — N186 End stage renal disease: Secondary | ICD-10-CM | POA: Diagnosis present

## 2015-11-08 DIAGNOSIS — I999 Unspecified disorder of circulatory system: Secondary | ICD-10-CM | POA: Diagnosis not present

## 2015-11-08 DIAGNOSIS — IMO0002 Reserved for concepts with insufficient information to code with codable children: Secondary | ICD-10-CM | POA: Diagnosis present

## 2015-11-08 DIAGNOSIS — Z7902 Long term (current) use of antithrombotics/antiplatelets: Secondary | ICD-10-CM

## 2015-11-08 HISTORY — DX: Cerebral infarction, unspecified: I63.9

## 2015-11-08 HISTORY — DX: Unspecified convulsions: R56.9

## 2015-11-08 LAB — CBC
HCT: 35.3 % — ABNORMAL LOW (ref 36.0–46.0)
Hemoglobin: 10.5 g/dL — ABNORMAL LOW (ref 12.0–15.0)
MCH: 25.7 pg — ABNORMAL LOW (ref 26.0–34.0)
MCHC: 29.7 g/dL — ABNORMAL LOW (ref 30.0–36.0)
MCV: 86.5 fL (ref 78.0–100.0)
Platelets: 386 K/uL (ref 150–400)
RBC: 4.08 MIL/uL (ref 3.87–5.11)
RDW: 20.2 % — ABNORMAL HIGH (ref 11.5–15.5)
WBC: 10.6 K/uL — ABNORMAL HIGH (ref 4.0–10.5)

## 2015-11-08 LAB — DIFFERENTIAL
Basophils Absolute: 0 K/uL (ref 0.0–0.1)
Basophils Relative: 0 %
Eosinophils Absolute: 0.1 K/uL (ref 0.0–0.7)
Eosinophils Relative: 1 %
Lymphocytes Relative: 9 %
Lymphs Abs: 1 K/uL (ref 0.7–4.0)
Monocytes Absolute: 0.5 K/uL (ref 0.1–1.0)
Monocytes Relative: 5 %
Neutro Abs: 9 K/uL — ABNORMAL HIGH (ref 1.7–7.7)
Neutrophils Relative %: 85 %

## 2015-11-08 LAB — URINALYSIS, ROUTINE W REFLEX MICROSCOPIC
Bilirubin Urine: NEGATIVE
Glucose, UA: 100 mg/dL — AB
Ketones, ur: NEGATIVE mg/dL
NITRITE: NEGATIVE
Protein, ur: >300 mg/dL — AB
SPECIFIC GRAVITY, URINE: 1.018 (ref 1.005–1.030)
pH: 7.5 (ref 5.0–8.0)

## 2015-11-08 LAB — COMPREHENSIVE METABOLIC PANEL WITH GFR
ALT: 14 U/L (ref 14–54)
AST: 32 U/L (ref 15–41)
Albumin: 2.1 g/dL — ABNORMAL LOW (ref 3.5–5.0)
Alkaline Phosphatase: 98 U/L (ref 38–126)
Anion gap: 10 (ref 5–15)
BUN: 30 mg/dL — ABNORMAL HIGH (ref 6–20)
CO2: 20 mmol/L — ABNORMAL LOW (ref 22–32)
Calcium: 9.2 mg/dL (ref 8.9–10.3)
Chloride: 107 mmol/L (ref 101–111)
Creatinine, Ser: 5.17 mg/dL — ABNORMAL HIGH (ref 0.44–1.00)
GFR calc Af Amer: 10 mL/min — ABNORMAL LOW
GFR calc non Af Amer: 8 mL/min — ABNORMAL LOW
Glucose, Bld: 179 mg/dL — ABNORMAL HIGH (ref 65–99)
Potassium: 5.3 mmol/L — ABNORMAL HIGH (ref 3.5–5.1)
Sodium: 137 mmol/L (ref 135–145)
Total Bilirubin: 0.3 mg/dL (ref 0.3–1.2)
Total Protein: 7.1 g/dL (ref 6.5–8.1)

## 2015-11-08 LAB — I-STAT CHEM 8, ED
BUN: 32 mg/dL — ABNORMAL HIGH (ref 6–20)
CALCIUM ION: 1.15 mmol/L (ref 1.12–1.23)
CHLORIDE: 109 mmol/L (ref 101–111)
Creatinine, Ser: 5.3 mg/dL — ABNORMAL HIGH (ref 0.44–1.00)
GLUCOSE: 176 mg/dL — AB (ref 65–99)
HCT: 33 % — ABNORMAL LOW (ref 36.0–46.0)
HEMOGLOBIN: 11.2 g/dL — AB (ref 12.0–15.0)
Potassium: 5 mmol/L (ref 3.5–5.1)
SODIUM: 140 mmol/L (ref 135–145)
TCO2: 20 mmol/L (ref 0–100)

## 2015-11-08 LAB — RAPID URINE DRUG SCREEN, HOSP PERFORMED
Amphetamines: NOT DETECTED
Barbiturates: NOT DETECTED
Benzodiazepines: NOT DETECTED
Cocaine: NOT DETECTED
Opiates: NOT DETECTED
Tetrahydrocannabinol: NOT DETECTED

## 2015-11-08 LAB — APTT: aPTT: 31 s (ref 24–37)

## 2015-11-08 LAB — I-STAT TROPONIN, ED: TROPONIN I, POC: 0.01 ng/mL (ref 0.00–0.08)

## 2015-11-08 LAB — URINE MICROSCOPIC-ADD ON

## 2015-11-08 LAB — ETHANOL: Alcohol, Ethyl (B): <5 mg/dL

## 2015-11-08 LAB — GLUCOSE, CAPILLARY: Glucose-Capillary: 104 mg/dL — ABNORMAL HIGH (ref 65–99)

## 2015-11-08 LAB — PROTIME-INR
INR: 1.06 (ref 0.00–1.49)
Prothrombin Time: 14 seconds (ref 11.6–15.2)

## 2015-11-08 MED ORDER — ACETAMINOPHEN 325 MG PO TABS: 650.0000 mg | ORAL_TABLET | Freq: Four times a day (QID) | ORAL | Status: DC | PRN

## 2015-11-08 MED ORDER — TOBRAMYCIN 0.3 % OP SOLN: 2.0000 [drp] | Freq: Every day | OPHTHALMIC | Status: DC

## 2015-11-08 MED ORDER — LORAZEPAM 2 MG/ML IJ SOLN
INTRAMUSCULAR | Status: AC
  Administered 2015-11-08: 2 mg
  Filled 2015-11-08: qty 1

## 2015-11-08 MED ORDER — SODIUM CHLORIDE 0.9 % IV SOLN
1500.0000 mg | Freq: Once | INTRAVENOUS | Status: AC
  Administered 2015-11-08: 1500 mg via INTRAVENOUS
  Filled 2015-11-08: qty 15

## 2015-11-08 MED ORDER — LORAZEPAM 2 MG/ML IJ SOLN
1.0000 mg | Freq: Once | INTRAMUSCULAR | Status: AC
  Administered 2015-11-08: 1 mg via INTRAVENOUS
  Filled 2015-11-08: qty 1

## 2015-11-08 MED ORDER — PHENYTOIN SODIUM 50 MG/ML IJ SOLN: 100.0000 mg | Freq: Three times a day (TID) | INTRAMUSCULAR | Status: DC

## 2015-11-08 MED ORDER — SODIUM CHLORIDE 0.9 % IV SOLN: 1100.0000 mg | Freq: Once | INTRAVENOUS | Status: DC

## 2015-11-08 MED ORDER — ONDANSETRON HCL 4 MG PO TABS: 4.0000 mg | ORAL_TABLET | Freq: Four times a day (QID) | ORAL | Status: DC | PRN

## 2015-11-08 MED ORDER — DEXTROSE 5 % IV SOLN
1.0000 g | Freq: Once | INTRAVENOUS | Status: AC
  Administered 2015-11-08: 1 g via INTRAVENOUS
  Filled 2015-11-08: qty 10

## 2015-11-08 MED ORDER — SODIUM CHLORIDE 0.9 % IV SOLN: 75.0000 mL/h | INTRAVENOUS | Status: DC

## 2015-11-08 MED ORDER — DEXTROSE 5 % IV SOLN
1.0000 g | INTRAVENOUS | Status: AC
  Administered 2015-11-09 – 2015-11-11 (×3): 1 g via INTRAVENOUS
  Filled 2015-11-08 (×3): qty 10

## 2015-11-08 MED ORDER — SODIUM CHLORIDE 0.9 % IV SOLN
INTRAVENOUS | Status: DC
  Administered 2015-11-08: 23:00:00 via INTRAVENOUS

## 2015-11-08 MED ORDER — PHENYTOIN SODIUM 50 MG/ML IJ SOLN
100.0000 mg | Freq: Three times a day (TID) | INTRAMUSCULAR | Status: DC
  Administered 2015-11-08 – 2015-11-12 (×11): 100 mg via INTRAVENOUS
  Filled 2015-11-08 (×12): qty 2

## 2015-11-08 MED ORDER — DEXTROSE 5 % IV SOLN
1500.0000 mg | INTRAVENOUS | Status: DC
  Filled 2015-11-08: qty 15

## 2015-11-08 MED ORDER — SODIUM CHLORIDE 0.9 % IV SOLN
1100.0000 mg | INTRAVENOUS | Status: AC
  Administered 2015-11-08: 1100 mg via INTRAVENOUS
  Filled 2015-11-08: qty 22

## 2015-11-08 MED ORDER — ACETAMINOPHEN 650 MG RE SUPP: 650.0000 mg | Freq: Four times a day (QID) | RECTAL | Status: DC | PRN

## 2015-11-08 MED ORDER — LORAZEPAM 2 MG/ML IJ SOLN: 1.0000 mg | INTRAMUSCULAR | Status: DC | PRN

## 2015-11-08 MED ORDER — ONDANSETRON HCL 4 MG/2ML IJ SOLN
4.0000 mg | Freq: Four times a day (QID) | INTRAMUSCULAR | Status: DC | PRN
  Administered 2015-11-13: 4 mg via INTRAVENOUS
  Filled 2015-11-08: qty 2

## 2015-11-08 MED ORDER — SODIUM CHLORIDE 0.9 % IV SOLN
500.0000 mg | INTRAVENOUS | Status: DC
  Administered 2015-11-09 – 2015-11-14 (×6): 500 mg via INTRAVENOUS
  Filled 2015-11-08 (×6): qty 5

## 2015-11-08 MED ORDER — DEXTROSE 5 % IV SOLN: 500.0000 mg | Freq: Three times a day (TID) | INTRAVENOUS | Status: DC

## 2015-11-08 MED ORDER — INSULIN ASPART 100 UNIT/ML ~~LOC~~ SOLN
0.0000 [IU] | SUBCUTANEOUS | Status: DC
  Administered 2015-11-10 – 2015-11-11 (×5): 1 [IU] via SUBCUTANEOUS
  Administered 2015-11-11: 2 [IU] via SUBCUTANEOUS
  Administered 2015-11-11: 1 [IU] via SUBCUTANEOUS
  Administered 2015-11-11: 2 [IU] via SUBCUTANEOUS

## 2015-11-08 MED ORDER — HEPARIN SODIUM (PORCINE) 5000 UNIT/ML IJ SOLN
5000.0000 [IU] | Freq: Three times a day (TID) | INTRAMUSCULAR | Status: DC
  Administered 2015-11-08 – 2015-11-13 (×15): 5000 [IU] via SUBCUTANEOUS
  Filled 2015-11-08 (×15): qty 1

## 2015-11-08 MED ORDER — ALBUTEROL SULFATE (2.5 MG/3ML) 0.083% IN NEBU: 2.5000 mg | INHALATION_SOLUTION | RESPIRATORY_TRACT | Status: DC | PRN

## 2015-11-08 NOTE — ED Notes (Signed)
Pt POA at bedside

## 2015-11-08 NOTE — ED Notes (Signed)
Pt's daughter and son at bedside.  Stating they feel patient is restless, and "showing signs of having another seizure".  MD made aware and came to bedside to discuss POC with patient's family.  Family reassured and pt made comfortable.

## 2015-11-08 NOTE — ED Notes (Signed)
Pt continues to be in MRI  

## 2015-11-08 NOTE — ED Notes (Addendum)
Pt had 1 episode of vomiting.  MD aware.  Pt was constantly clearing her throat, so this RN attempted to suction.  Vomiting may have been related to gag reflex stimulation.  Pt propped on side, suction done, cleaning and linen change completed.

## 2015-11-08 NOTE — Progress Notes (Signed)
Dr. Tamala Julian paged about no order for peritoneal dialysis. Sister [name] stated she missed last night's treatment. Pt is in no respiratory distress, no edema or apparent fluid overload, lung sounds clear. Per Dr. Tamala Julian, nephrology has been paged with no answer regarding PD. Last BP 161/67 so IVF order discontinued.

## 2015-11-08 NOTE — ED Notes (Signed)
Dr. Kirkpatrick at bedside 

## 2015-11-08 NOTE — ED Provider Notes (Signed)
CSN: [id number]     Arrival date & time 11/08/15  1531 History   First MD Initiated Contact with Patient 11/08/15 1559     Chief Complaint  Patient presents with  . Code Stroke  . Seizures    '@EDPCLEARED'$ @ (Consider location/radiation/quality/duration/timing/severity/associated sxs/prior Treatment) Patient is a 60 y.o. female presenting with seizures.  Seizures Seizure activity on arrival: yes   Seizure type:  Partial simple Initial focality:  Unable to specify Postictal symptoms: somnolence   Return to baseline: no   Severity:  Severe   Per Dr. Wyvonnia Dusky who initiated MSE:   patient arrived via EMS as code stroke. History of seizures reportedly stopped her Keppra 3 days ago. EMS was called out for generalized tonic-clonic seizure activity around 11 AM. Patient refused transport. She had a second seizure around 2:20 PM and left-sided paralysis. EMS called code stroke. Patient with seizure activity on arrival and twitching of her face. She was given 5 mg of Valium by EMS and 2 mg of Ativan by Dr. Leonel Ramsay.  On my exam, patient is somnolent and not making verbal communication thus no further history or ROS able to be obtained.   Past Medical History  Diagnosis Date  . CVA, old, hemiparesis ([place])   . Hypertension   . Diabetes ([place])   . ESRD on peritoneal dialysis ([place])   . CHF (congestive heart failure) ([place])   . Hypothyroidism   . Seizures [facility])    Past Surgical History  Procedure Laterality Date  . Cholecystectomy    . Amputation toe    . Insertion of dialysis catheter     History reviewed. No pertinent family history. Social History  Substance Use Topics  . Smoking status: Former Smoker -- 1.00 packs/day for 30 years    Types: Cigarettes  . Smokeless tobacco: None  . Alcohol Use: No   OB History    No data available     Review of Systems  Unable to perform ROS: Patient nonverbal  Neurological: Positive for seizures.      Allergies  Salicylates and  Asa  Home Medications   Prior to Admission medications   Medication Sig Start Date End Date Taking? Authorizing Provider  aspirin EC 81 MG tablet Take 81 mg by mouth every morning.     Historical Provider, MD  ciprofloxacin (CIPRO) 500 MG tablet Take 500 mg by mouth See admin instructions. Take 1 tablet by mouth once a day after dialysis in case of peritonitis. *Only take if directed by kidney doctor*    Historical Provider, MD  citalopram (CELEXA) 10 MG tablet Take 10 mg by mouth every morning.     Historical Provider, MD  clopidogrel (PLAVIX) 75 MG tablet Take 75 mg by mouth every morning.     Historical Provider, MD  hydrOXYzine (VISTARIL) 25 MG capsule Take 25 mg by mouth every morning.     Historical Provider, MD  levETIRAcetam (KEPPRA) 500 MG tablet Take 1 tablet (500 mg total) by mouth 2 (two) times daily. Patient taking differently: Take 500 mg by mouth daily.  05/28/15   Vaughan Basta, MD  levothyroxine (SYNTHROID, LEVOTHROID) 25 MCG tablet Take 25 mcg by mouth daily before breakfast.    Historical Provider, MD  mirtazapine (REMERON) 15 MG tablet Take 15 mg by mouth at bedtime.    Historical Provider, MD  nicotine (NICODERM CQ - DOSED IN MG/24 HOURS) 21 mg/24hr patch Place 1 patch (21 mg total) onto the skin daily. 05/28/15   Vaughan Basta, MD  nicotine (NICODERM CQ - DOSED IN MG/24 HR) 7 mg/24hr patch Place 7 mg onto the skin daily.    Historical Provider, MD  ondansetron (ZOFRAN) 4 MG tablet Take 1 tablet (4 mg total) by mouth every 8 (eight) hours as needed for nausea or vomiting. 07/31/15   Nance Pear, MD  oxyCODONE-acetaminophen (PERCOCET/ROXICET) 5-325 MG tablet Take 1 tablet by mouth every 6 (six) hours as needed. For pain.    Historical Provider, MD  pantoprazole (PROTONIX) 40 MG tablet Take 40 mg by mouth every morning.     Historical Provider, MD  potassium chloride SA (K-DUR,KLOR-CON) 20 MEQ tablet Take 20 mEq by mouth at bedtime.    Historical Provider, MD   promethazine (PHENERGAN) 25 MG tablet Take 25 mg by mouth every 8 (eight) hours as needed for nausea or vomiting.    Historical Provider, MD  simvastatin (ZOCOR) 20 MG tablet Take 20 mg by mouth at bedtime.     Historical Provider, MD  Vitamin D, Ergocalciferol, (DRISDOL) 50000 UNITS CAPS capsule Take 50,000 Units by mouth every 30 (thirty) days.    Historical Provider, MD   BP 180/94 mmHg  Pulse 102  Temp(Src) 98.2 F (36.8 C) (Oral)  Resp 20  SpO2 100% Physical Exam  Constitutional: She appears well-developed and well-nourished.  HENT:  Head: Normocephalic and atraumatic.  Eyes: Pupils are equal, round, and reactive to light.  Neck: Normal range of motion.  Cardiovascular: Normal rate and regular rhythm.   Pulmonary/Chest: Effort normal. She has no wheezes.  Abdominal: Soft. There is no tenderness.  Musculoskeletal: Normal range of motion. She exhibits no edema or tenderness.  Neurological: She is alert.  Skin: Skin is warm and dry.  Nursing note and vitals reviewed.   ED Course  Procedures (including critical care time)  CRITICAL CARE Performed by: Merrily Pew  Total critical care time: 35 minutes Critical care time was exclusive of separately billable procedures and treating other patients. Critical care was necessary to treat or prevent imminent or life-threatening deterioration. Critical care was time spent personally by me on the following activities: development of treatment plan with patient and/or surrogate as well as nursing, discussions with consultants, evaluation of patient's response to treatment, examination of patient, obtaining history from patient or surrogate, ordering and performing treatments and interventions, ordering and review of laboratory studies, ordering and review of radiographic studies, pulse oximetry and re-evaluation of patient's condition.   Labs Review Labs Reviewed  I-STAT CHEM 8, ED - Abnormal; Notable for the following:    BUN 32 (*)     Creatinine, Ser 5.30 (*)    Glucose, Bld 176 (*)    Hemoglobin 11.2 (*)    HCT 33.0 (*)    All other components within normal limits  ETHANOL  PROTIME-INR  APTT  CBC  DIFFERENTIAL  COMPREHENSIVE METABOLIC PANEL  URINE RAPID DRUG SCREEN, HOSP PERFORMED  URINALYSIS, ROUTINE W REFLEX MICROSCOPIC (NOT AT [facility])  I-STAT TROPOININ, ED    Imaging Review Ct Head Wo Contrast  11/08/2015  CLINICAL DATA:  Seizure activity EXAM: CT HEAD WITHOUT CONTRAST TECHNIQUE: Contiguous axial images were obtained from the base of the skull through the vertex without intravenous contrast. COMPARISON:  09/12/2015 FINDINGS: The bony calvarium is intact. Heavy calcification of the carotid arteries is again seen at the skullbase. Mild atrophic changes are seen. Changes of prior infarcts are noted in the distribution of the right middle cerebral artery. Lacunar infarct is again identified on the right in the basal ganglia.  No findings to suggest acute hemorrhage, acute infarction or space-occupying mass lesion are noted. IMPRESSION: Chronic ischemic changes on the right. No acute abnormality is noted. These results were called by telephone at the time of interpretation on 11/08/2015 at 3:55 pm to Dr. Leonel Ramsay, who verbally acknowledged these results. Electronically Signed   By: Inez Catalina M.D.   On: 11/08/2015 15:57   I have personally reviewed and evaluated these images and lab results as part of my medical decision-making.   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 Has already had keppra and multiple rounds of benzoes. Will continue attempting to break seizures.  Patient has continued to improve. MRI negative, neurology thinks it is more c/w likely post ictal todds paralysis. Started cerebrex. Also with UTI, started on rocephin. Will observe while getting cerebrex and if MS is still appropriate will plan for admission to floor.   Patient with slightly decreased MS with the cerebrex but still protexting airway even with an episode of vomiting she was able to cough. No need for invasive ventilation at this point. Will admit to stepdown 2/2 still altered mental status.     Merrily Pew, MD 11/08/15 [phone]

## 2015-11-08 NOTE — ED Provider Notes (Signed)
MSE was initiated and I personally evaluated the patient and placed orders (if any) at  3:55 PM on Nov 08, 2015.  The patient appears stable so that the remainder of the MSE may be completed by another provider.   patient arrives via EMS as code stroke. History of seizures reportedly stopped her Keppra 3 days ago. EMS was called out for generalized tonic-clonic seizure activity around 11 AM. Patient refused transport. She had a second seizure around 2:20 PM and left-sided paralysis. EMS called code stroke. Patient with seizure activity on arrival and twitching of her face. She was given 5 mg of Valium by EMS and 2 mg of Ativan by Dr. Leonel Ramsay.  Patient is somnolent, nonverbal, following some commands on the right. No movement on the left. She is protecting her airway.  CBG is normal. CT head ordered, keppra ordered.  Ezequiel Essex, MD 11/08/15 [phone]

## 2015-11-08 NOTE — H&P (Signed)
History and Physical    [name]:[id number] DOB: 16-Apr-1956 DOA: 11/08/2015  Referring MD/NP/PA: Dr. Dolly Rias PCP: [facility]  Patient coming from: home  Chief Complaint: Seizures  HPI: Sherri Davidson is a 60 y.o. female with medical history significant of CVA, HTN, CHF, hypothyroidism, diabetes mellitus type 2, ESRD on peritoneal hemodialysis daily seen at [facility]  by Dr. Candiss Norse; who presents with complaints of seizures. History is obtained by the patient's sister with whom she lives named Sherri Davidson as the patient is acutely altered. Patient was reportedly in her normal state of health until around 12:45-1pm when patient was noted to have her first seizure that lasted 5-6 minutes with generalized tonic-clonic movements. EMS was called and patient was able to return to baseline or refused transport to the hospital at that time. She was given her morning meds including Keppra around 1:30 PM. Subsequently around 2:18 PM she had a second seizure with acute left-sided facial drooping noted for which EMS was again called. Patient has stopped seizing by the time EMS arrived, but was very lethargic and altered. On transferring her into the ambulance patient had a third seizure. Given Ativan in route. Patient was noted to have just recently been taken off of [place] by her neurologist Dr. Melrose Nakayama at [facility] in approximately 3 days ago. Patient had also been on Dilantin previously but this was stopped in 06/2015 after the patient had repeated falls. Her last seizure activity prior to this was noted to be in 05/2015.   ED Course: Upon admission into the emergency department patient was not noted to have any subsequent seizures. Workup including CT scan and MRI of the brain showed previous right sided CVA, but no acute abnormalities. UA was found to be positive for many bacteria, large leukocytes, and too numerous to count PVCs. UDS negative. Patient was  evaluated by neurology who recommended continuation of Keppra 500 mg daily and Dilantin 100 mg 3 times a day.   Review of Systems:  unable to obtain due to patient's being acutely altered   Past Medical History  Diagnosis Date  . CVA, old, hemiparesis ([place])   . Hypertension   . Diabetes ([place])   . ESRD on peritoneal dialysis ([place])   . CHF (congestive heart failure) ([place])   . Hypothyroidism   . Seizures [facility])     Past Surgical History  Procedure Laterality Date  . Cholecystectomy    . Amputation toe    . Insertion of dialysis catheter       reports that she has quit smoking. Her smoking use included Cigarettes. She has a 30 pack-year smoking history. She does not have any smokeless tobacco history on file. She reports that she does not drink alcohol or use illicit drugs.  Allergies  Allergen Reactions  . Adhesive [Tape] Hives    Please use paper tape  . Salicylates Hives and Nausea Only    Uncoated.  Diona Fanti [Aspirin] Other (See Comments)    GI upset for non-enteric coated aspirin    History reviewed. No pertinent family history.  Prior to Admission medications   Medication Sig Start Date End Date Taking? Authorizing Provider  aspirin EC 81 MG tablet Take 81 mg by mouth daily. For heart protection   Yes Historical Provider, MD  ciprofloxacin (CIPRO) 500 MG tablet Take 750 mg by mouth See admin instructions. Take 1 1/2 tablet (750 mg) by mouth once a day after dialysis if instructed to take  by PD nurse for  peritonitis. *Only take if directed by kidney doctor*   Yes Historical Provider, MD  citalopram (CELEXA) 10 MG tablet Take 10 mg by mouth daily as needed (depression).    Yes Historical Provider, MD  clopidogrel (PLAVIX) 75 MG tablet Take 75 mg by mouth daily. For heart protection   Yes Historical Provider, MD  gentamicin (GARAMYCIN) 0.3 % ophthalmic solution Apply 2 drops topically daily. Apply to wound site daily   Yes Historical Provider, MD  hydrOXYzine (VISTARIL) 25 MG  capsule Take 25 mg by mouth daily. For itching   Yes Historical Provider, MD  levETIRAcetam (KEPPRA) 500 MG tablet Take 1 tablet (500 mg total) by mouth 2 (two) times daily. Patient taking differently: Take 500 mg by mouth daily. For seizure prevention 05/28/15  Yes Vaughan Basta, MD  levothyroxine (SYNTHROID, LEVOTHROID) 50 MCG tablet Take 50 mcg by mouth daily. For hypothyroidism   Yes Historical Provider, MD  loperamide (IMODIUM) 2 MG capsule Take 2 mg by mouth See admin instructions. Take 1 capsule (2 mg) by mouth every mornin , may also  take 1 capsule (2 mg) after each loose stool if needed (max 4 capsules in 24 hours)   Yes Historical Provider, MD  oxyCODONE-acetaminophen (PERCOCET/ROXICET) 5-325 MG tablet Take 1 tablet by mouth every 6 (six) hours as needed (pain).    Yes Historical Provider, MD  pantoprazole (PROTONIX) 40 MG tablet Take 40 mg by mouth daily.    Yes Historical Provider, MD  potassium chloride SA (K-DUR,KLOR-CON) 20 MEQ tablet Take 20 mEq by mouth at bedtime.   Yes Historical Provider, MD  promethazine (PHENERGAN) 25 MG tablet Take 25 mg by mouth every 8 (eight) hours as needed for nausea or vomiting.   Yes Historical Provider, MD  ranitidine (ZANTAC) 150 MG tablet Take 150 mg by mouth daily as needed for heartburn.   Yes Historical Provider, MD  simvastatin (ZOCOR) 20 MG tablet Take 20 mg by mouth at bedtime. For elevated cholesterol   Yes Historical Provider, MD  Vitamin D, Ergocalciferol, (DRISDOL) 50000 units CAPS capsule Take 50,000 Units by mouth every 30 (thirty) days. On or about the 1st of each month   Yes Historical Provider, MD  ondansetron (ZOFRAN) 4 MG tablet Take 1 tablet (4 mg total) by mouth every 8 (eight) hours as needed for nausea or vomiting. Patient not taking: Reported on 11/08/2015 07/31/15   Nance Pear, MD    Physical Exam: Filed Vitals:   11/08/15 1930 11/08/15 2000 11/08/15 2015 11/08/15 2030  BP: 154/76 159/84 136/76 150/79  Pulse: 103  102 99 98  Temp:      TempSrc:      Resp: '20 20 20 23  '$ SpO2: 95% 100% 100% 98%      Constitutional:  Obtunded not responding to verbal commands  Filed Vitals:   11/08/15 1930 11/08/15 2000 11/08/15 2015 11/08/15 2030  BP: 154/76 159/84 136/76 150/79  Pulse: 103 102 99 98  Temp:      TempSrc:      Resp: '20 20 20 23  '$ SpO2: 95% 100% 100% 98%   Eyes: PERRL, lids and conjunctivae normal ENMT: Mucous membranes are moist. Posterior pharynx clear of any exudate or lesions.Normal dentition.  Neck: normal, supple, no masses, no thyromegaly Respiratory:  Shallow breathing with Intermittent apneic spells and cough Cardiovascular: Mildly tachycardic, no murmurs / rubs / gallops. No extremity edema. 2+ pedal pulses. No carotid bruits.  Abdomen: no tenderness, no masses palpated. No hepatosplenomegaly.  Bowel sounds positive.  Musculoskeletal: no clubbing / cyanosis. No joint deformity upper and lower extremities. Good ROM, no contractures. Normal muscle tone.  Skin: no rashes, lesions, ulcers. No induration Neurologic: Moves to noxious stimuli and has cough. Mild left facial droop Psychiatric: Somnolent cannot assess mood and judgment  Labs on Admission: I have personally reviewed following labs and imaging studies  CBC:  Recent Labs Lab 11/08/15 1539 11/08/15 1559  WBC  --  10.6*  NEUTROABS  --  9.0*  HGB 11.2* 10.5*  HCT 33.0* 35.3*  MCV  --  86.5  PLT  --  176   Basic Metabolic Panel:  Recent Labs Lab 11/08/15 1539 11/08/15 1559  NA 140 137  K 5.0 5.3*  CL 109 107  CO2  --  20*  GLUCOSE 176* 179*  BUN 32* 30*  CREATININE 5.30* 5.17*  CALCIUM  --  9.2   GFR: CrCl cannot be calculated (Unknown ideal weight.). Liver Function Tests:  Recent Labs Lab 11/08/15 1559  AST 32  ALT 14  ALKPHOS 98  BILITOT 0.3  PROT 7.1  ALBUMIN 2.1*   No results for input(s): LIPASE, AMYLASE in the last 168 hours. No results for input(s): AMMONIA in the last 168  hours. Coagulation Profile:  Recent Labs Lab 11/08/15 1559  INR 1.06   Cardiac Enzymes: No results for input(s): CKTOTAL, CKMB, CKMBINDEX, TROPONINI in the last 168 hours. BNP (last 3 results) No results for input(s): PROBNP in the last 8760 hours. HbA1C: No results for input(s): HGBA1C in the last 72 hours. CBG: No results for input(s): GLUCAP in the last 168 hours. Lipid Profile: No results for input(s): CHOL, HDL, LDLCALC, TRIG, CHOLHDL, LDLDIRECT in the last 72 hours. Thyroid Function Tests: No results for input(s): TSH, T4TOTAL, FREET4, T3FREE, THYROIDAB in the last 72 hours. Anemia Panel: No results for input(s): VITAMINB12, FOLATE, FERRITIN, TIBC, IRON, RETICCTPCT in the last 72 hours. Urine analysis:    Component Value Date/Time   COLORURINE YELLOW 11/08/2015 1707   COLORURINE Yellow 05/30/2014 1618   APPEARANCEUR TURBID* 11/08/2015 1707   APPEARANCEUR Clear 05/30/2014 1618   LABSPEC 1.018 11/08/2015 1707   LABSPEC 1.009 05/30/2014 1618   PHURINE 7.5 11/08/2015 1707   PHURINE 7.0 05/30/2014 1618   GLUCOSEU 100* 11/08/2015 1707   GLUCOSEU >=500 05/30/2014 1618   HGBUR LARGE* 11/08/2015 1707   HGBUR Negative 05/30/2014 1618   BILIRUBINUR NEGATIVE 11/08/2015 1707   BILIRUBINUR Negative 05/30/2014 1618   KETONESUR NEGATIVE 11/08/2015 1707   KETONESUR Negative 05/30/2014 1618   PROTEINUR >300* 11/08/2015 1707   PROTEINUR 100 mg/dL 05/30/2014 1618   NITRITE NEGATIVE 11/08/2015 1707   NITRITE Negative 05/30/2014 1618   LEUKOCYTESUR LARGE* 11/08/2015 1707   LEUKOCYTESUR Negative 05/30/2014 1618   Sepsis Labs: No results found for this or any previous visit (from the past 240 hour(s)).   Radiological Exams on Admission: Ct Head Wo Contrast  11/08/2015  CLINICAL DATA:  Seizure activity EXAM: CT HEAD WITHOUT CONTRAST TECHNIQUE: Contiguous axial images were obtained from the base of the skull through the vertex without intravenous contrast. COMPARISON:  09/12/2015  FINDINGS: The bony calvarium is intact. Heavy calcification of the carotid arteries is again seen at the skullbase. Mild atrophic changes are seen. Changes of prior infarcts are noted in the distribution of the right middle cerebral artery. Lacunar infarct is again identified on the right in the basal ganglia. No findings to suggest acute hemorrhage, acute infarction or space-occupying mass lesion are noted. IMPRESSION: Chronic  ischemic changes on the right. No acute abnormality is noted. These results were called by telephone at the time of interpretation on 11/08/2015 at 3:55 pm to Dr. Leonel Ramsay, who verbally acknowledged these results. Electronically Signed   By: Inez Catalina M.D.   On: 11/08/2015 15:57   Mr Virgel Paling Wo Contrast  11/08/2015  CLINICAL DATA:  60 year old hypertensive diabetic female on dialysis. History of seizures. Off seizure medication for 3 days. Seizure this morning. Left-sided flaccid. Remote infarct. Subsequent encounter. EXAM: MRI HEAD WITHOUT CONTRAST MRA HEAD WITHOUT CONTRAST TECHNIQUE: Multiplanar, multiecho pulse sequences of the brain and surrounding structures were obtained without intravenous contrast. Angiographic images of the head were obtained using MRA technique without contrast. COMPARISON:  11/08/2015 head CT.  05/24/2015 brain MR. FINDINGS: MRI HEAD FINDINGS Exam is motion degraded. No acute infarct or intracranial hemorrhage. Remote large right hemispheric infarct with encephalomalacia right frontal lobe, parietal lobe and temporal lobe with subsequent dilation right temporal horn. Remote infarct right corona radiata/lenticular nucleus. Remote small infarct left caudate. Remote small infarct left thalamus. Prominent chronic microvascular changes. Global atrophy without hydrocephalus. No intracranial mass lesion noted on this unenhanced exam. Cervical medullary junction unremarkable. MRA HEAD FINDINGS Exam limited by motion degradation with markedly limited evaluation for  grading stenosis and detecting aneurysm. All that can be stated with certainty is that there is flow within portions of the internal carotid arteries bilaterally, vertebral arteries and basilar artery. Findings suspicious for prominent intracranial atherosclerotic changes including significant stenosis left internal carotid artery cavernous segment, significant posterior cerebral artery narrowing greater on the left and decrease number of visualized right middle cerebral artery branch vessels (consistent with patient's remote right hemispheric infarct). IMPRESSION: MRI HEAD Exam is significantly motion degraded. No acute infarct or intracranial hemorrhage. Remote large right hemispheric infarct. Remote infarct right corona radiata/lenticular nucleus. Remote small infarct left caudate. Remote small infarct left thalamus. Prominent chronic microvascular changes. Global atrophy without hydrocephalus. MRA HEAD Exam limited by motion degradation with markedly limited evaluation for grading stenosis and detecting aneurysm. All that can be stated with certainty is that there is flow within portions of the internal carotid arteries bilaterally, vertebral arteries and basilar artery. Findings suspicious for prominent intracranial atherosclerotic changes. Electronically Signed   By: Genia Del M.D.   On: 11/08/2015 18:33   Mr Brain Wo Contrast  11/08/2015  CLINICAL DATA:  60 year old hypertensive diabetic female on dialysis. History of seizures. Off seizure medication for 3 days. Seizure this morning. Left-sided flaccid. Remote infarct. Subsequent encounter. EXAM: MRI HEAD WITHOUT CONTRAST MRA HEAD WITHOUT CONTRAST TECHNIQUE: Multiplanar, multiecho pulse sequences of the brain and surrounding structures were obtained without intravenous contrast. Angiographic images of the head were obtained using MRA technique without contrast. COMPARISON:  11/08/2015 head CT.  05/24/2015 brain MR. FINDINGS: MRI HEAD FINDINGS Exam is  motion degraded. No acute infarct or intracranial hemorrhage. Remote large right hemispheric infarct with encephalomalacia right frontal lobe, parietal lobe and temporal lobe with subsequent dilation right temporal horn. Remote infarct right corona radiata/lenticular nucleus. Remote small infarct left caudate. Remote small infarct left thalamus. Prominent chronic microvascular changes. Global atrophy without hydrocephalus. No intracranial mass lesion noted on this unenhanced exam. Cervical medullary junction unremarkable. MRA HEAD FINDINGS Exam limited by motion degradation with markedly limited evaluation for grading stenosis and detecting aneurysm. All that can be stated with certainty is that there is flow within portions of the internal carotid arteries bilaterally, vertebral arteries and basilar artery. Findings suspicious for prominent intracranial atherosclerotic  changes including significant stenosis left internal carotid artery cavernous segment, significant posterior cerebral artery narrowing greater on the left and decrease number of visualized right middle cerebral artery branch vessels (consistent with patient's remote right hemispheric infarct). IMPRESSION: MRI HEAD Exam is significantly motion degraded. No acute infarct or intracranial hemorrhage. Remote large right hemispheric infarct. Remote infarct right corona radiata/lenticular nucleus. Remote small infarct left caudate. Remote small infarct left thalamus. Prominent chronic microvascular changes. Global atrophy without hydrocephalus. MRA HEAD Exam limited by motion degradation with markedly limited evaluation for grading stenosis and detecting aneurysm. All that can be stated with certainty is that there is flow within portions of the internal carotid arteries bilaterally, vertebral arteries and basilar artery. Findings suspicious for prominent intracranial atherosclerotic changes. Electronically Signed   By: Genia Del M.D.   On: 11/08/2015  18:33    EKG: Independently reviewed. 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 Patient with 3 reported seizures earlier in the day for which patient was noted to have facial drooping. Recent history of stopping seizure medications. Imaging studies show no acute abnormalities except for previous right-sided CVA. Neurology evaluated and gave recommendations as seen below. - Admit to stepdown for close monitoring  - Patient nothing by mouth for now - seizure precautions  -  question other causes for prolonged postictal period - Speech therapy to eval and treat in am   - Continue Keppra 500 mg daily and Dilantin 100 mg 3 times a day per neurology - neurology will continue to follow  Urinary tract infection: Acute. Question of urinary tract infection as cause and lower seizure threshold and continued encephalopathy. - Follow-up urine culture - continue Empiric antibiotics of Rocephin per pharmacy   Cough: Question possibility of aspiration - Checking chest x-ray  Diabetes mellitus type 2 with hyperglycemia - CBG q4 hrs with sensitive sliding scale insulin  Hyperkalemia:5.3 on admission - Unable to give a lot of IV fluids 2/2 concern for putting patient in fluid overload   End-stage renal disease on peritoneal dialysis: Patient missed last peritoneal dosing. - Tried to page nephrology twice overnight without response. Left message on HD line at [phone] - Will likely need to consult nephrology in a.m. to continue peritoneal dialysis   Pressure ulcer - Wound care consult - low air mattress  Will need to reconcile medications in a.m. patient more alert and able to swallow evaluation   DVT prophylaxis: heparin Code Status: Full Family Communication: Discussed plan with her sister present at bedside Disposition Plan: Undetermined   Consults called: Neurology  Admission status:  Observation  Norval Morton MD [facility] Pager [phone]  If 7PM-7AM, please contact night-coverage [email or web address] Password [email or web address]  11/08/2015, 9:01 PM

## 2015-11-08 NOTE — ED Notes (Signed)
  Per EMS:  Pt w/ a hx of seizures.  Pt has a seizure this afternoon at approximately 1130am.  Pt was taken off of her seizure meds approx 3 days ago.  Pt was seen by EMS at this time and refused transport.  Pt then had another seizure 1420 and EMs was called again.  After the seizure patient was experiencing left sided paralysis.  Pt was transported, and during transport had another tonic/clonic seizure.  '5mg'$  of Valium was given.  Pt responds to voice, but is still having difficulties manipulating her left side.  Some left-sided movement noted in CT.  Pt given '2mg'$  Ativan upon arrival.

## 2015-11-08 NOTE — ED Notes (Signed)
Admitting at bedside 

## 2015-11-08 NOTE — ED Notes (Signed)
Pt's family concerned pt is irritable and not able to lay still in the bed.  Pt moving her right arm in the air, and fidgeting a lot.  Spoke to MD.  Will order Ativan.

## 2015-11-08 NOTE — Consult Note (Signed)
Neurology Consultation Reason for Consult: Seizures Referring Physician: Verneda Skill  CC: Seizures  History is obtained from: Medical record, EMS  HPI: Sherri Davidson is a 60 y.o. female who was in her normal state of health this morning until 11:30 AM. At that time, the patient had a seizure but returned completely to baseline. EMS had been called, but she refused transport.  At 2:20 PM, she had a second seizure and following this when she had a persistent left hemiparesis. EMS was called and began transporting the patient. En route, she had a generalized seizure and was given 5 of Valium with cessation of seizure activity, but the patient had a persistent left hemiparesis and therefore a code stroke was activated. On arrival the bridge here, she began having left facial twitching which persisted until she was given 2 mg of IV Ativan with cessation of twitching activity. She was loaded with IV Keppra 1500 mg. She had a persistent dense left hemi-parasis was unable to cross midline with her eyes to the left. An MRI was obtained emergently to rule out stroke as a component of this which was negative for acute stroke. Her MRA is severely limited, but I do not see clear evidence of large vessel occlusion.  Given that she sought a persistent left hemiparesis, IV Cerebyx was started and she began having improvement in her left-sided weakness just as the IV Cerebyx was getting started.  Of note, she had a history of seizures with a very similar presentation including prolonged postictal hemiparesis in December of last year. She was found on MRI to have a small acute infarct on the left as well as significant hippocampal diffusion change on the right consistent with postictal phenomenon. She was started on Keppra and Phenytek at that time. Phenytek was taken off, and she is maintained on Keppra until 3 days ago at which time it was stopped by her neurologist given that she only had a signal seizure in the  setting of acute stroke previously.  LKW: 2:20 PM tpa given?: no, not a stroke  ROS:  Unable to obtain due to altered mental status.   Past Medical History  Diagnosis Date  . CVA, old, hemiparesis ([place])   . Hypertension   . Diabetes ([place])   . ESRD on peritoneal dialysis ([place])   . CHF (congestive heart failure) ([place])   . Hypothyroidism   . Seizures ([place])     Family history: Unable to obtain due to nonverbal nature of the patient   Social History:  reports that she has quit smoking. Her smoking use included Cigarettes. She has a 30 pack-year smoking history. She does not have any smokeless tobacco history on file. She reports that she does not drink alcohol or use illicit drugs.   Exam: Current vital signs: BP 141/71 mmHg  Pulse 96  Temp(Src) 98.2 F (36.8 C) (Oral)  Resp 15  SpO2 98% Vital signs in last 24 hours: Temp:  [98.2 F (36.8 C)] 98.2 F (36.8 C) (05/27 1550) Pulse Rate:  [95-103] 96 (05/27 1900) Resp:  [14-20] 15 (05/27 1900) BP: (141-212)/(71-95) 141/71 mmHg (05/27 1900) SpO2:  [98 %-100 %] 98 % (05/27 1900)   Physical Exam  Constitutional: Appears older than stated age Psych: Affect appropriate to situation Eyes: No scleral injection HENT: No OP obstrucion Head: Normocephalic.  Cardiovascular: Normal rate and regular rhythm.  Respiratory: Effort normal and breath sounds normal to anterior ascultation GI: Soft.  No distension. There is no tenderness.  Skin:  WDI  Neuro: Mental Status: Patient is awake, alert, she follows commands briskly with the right side. She does not speak. She appears to neglect the left side Cranial Nerves: II: She appears to have a left hemianopia Pupils are equal, round, and reactive to light.   III,IV, VI: She does not cross midline to the left  V: Facial sensation is decreased on the left VII: Facial movement is decreased on the left VIII: hearing is intact to voice X: She does cough Motor: 5/5 on the right, she has a  dense left hemiplegia with no movement of her left arm and only minimal movement of her left leg Sensory: Sensation is diminished on the left Cerebellar: No clear ataxia on the right    I have reviewed labs in epic and the results pertinent to this consultation are: Elevated creatinine  I have reviewed the images obtained: CT head-negative, MRI head-negative  Impression: 60 year old female who presented in status epilepticus with subsequent resolution posttreatment. 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 I would favor continuing to treat at this time with antiepileptics and continued observation for improvement.  Recommendations: 1) Keppra 500 mg daily 2) Dilantin 100 mg 3 times a day 3) neurology will continue to follow  Roland Rack, MD [facility] [phone]  If 7pm- 7am, please page neurology on call as listed in [place].

## 2015-11-08 NOTE — ED Notes (Signed)
Carlyle Lip[facility] - [name] - [phone] - call this if [name] does not answer her home phone number.

## 2015-11-08 NOTE — Progress Notes (Signed)
Code stroke called at 1501.  Patient arrived to [facility] ED via [facility] at 1531.  AS per EMS, patient was taken off her seizure meds 3 days ago, had a seizure this am and refused transport to hospital by EMS, EMS called back out for seizure then noticed left side flaccid.  LSN [id number].  En route patient had tonic clonic seizure and was given 5 mg valium, On arrival to ED patient had facial twitching, 2 mg IV ativan given. Code stroke cancelled.

## 2015-11-09 ENCOUNTER — Observation Stay (HOSPITAL_COMMUNITY): Payer: Medicare (Managed Care)

## 2015-11-09 DIAGNOSIS — G9340 Encephalopathy, unspecified: Secondary | ICD-10-CM | POA: Diagnosis present

## 2015-11-09 DIAGNOSIS — R569 Unspecified convulsions: Secondary | ICD-10-CM | POA: Diagnosis not present

## 2015-11-09 DIAGNOSIS — E875 Hyperkalemia: Secondary | ICD-10-CM | POA: Diagnosis present

## 2015-11-09 LAB — BASIC METABOLIC PANEL
ANION GAP: 10 (ref 5–15)
BUN: 33 mg/dL — AB (ref 6–20)
CHLORIDE: 109 mmol/L (ref 101–111)
CO2: 22 mmol/L (ref 22–32)
Calcium: 8.9 mg/dL (ref 8.9–10.3)
Creatinine, Ser: 5.54 mg/dL — ABNORMAL HIGH (ref 0.44–1.00)
GFR calc Af Amer: 9 mL/min — ABNORMAL LOW (ref >60)
GFR calc non Af Amer: 8 mL/min — ABNORMAL LOW (ref >60)
GLUCOSE: 120 mg/dL — AB (ref 65–99)
POTASSIUM: 5.2 mmol/L — AB (ref 3.5–5.1)
SODIUM: 141 mmol/L (ref 135–145)

## 2015-11-09 LAB — GLUCOSE, CAPILLARY
GLUCOSE-CAPILLARY: 104 mg/dL — AB (ref 65–99)
GLUCOSE-CAPILLARY: 110 mg/dL — AB (ref 65–99)
GLUCOSE-CAPILLARY: 87 mg/dL (ref 65–99)
GLUCOSE-CAPILLARY: 98 mg/dL (ref 65–99)
Glucose-Capillary: 119 mg/dL — ABNORMAL HIGH (ref 65–99)
Glucose-Capillary: 91 mg/dL (ref 65–99)

## 2015-11-09 LAB — CBC
HCT: 31.4 % — ABNORMAL LOW (ref 36.0–46.0)
HEMOGLOBIN: 9.5 g/dL — AB (ref 12.0–15.0)
MCH: 26.1 pg (ref 26.0–34.0)
MCHC: 30.3 g/dL (ref 30.0–36.0)
MCV: 86.3 fL (ref 78.0–100.0)
Platelets: 356 10*3/uL (ref 150–400)
RBC: 3.64 MIL/uL — AB (ref 3.87–5.11)
RDW: 20.5 % — AB (ref 11.5–15.5)
WBC: 8.4 10*3/uL (ref 4.0–10.5)

## 2015-11-09 LAB — FERRITIN: Ferritin: 661 ng/mL — ABNORMAL HIGH (ref 11–307)

## 2015-11-09 LAB — IRON AND TIBC
Iron: 35 ug/dL (ref 28–170)
SATURATION RATIOS: 18 % (ref 10.4–31.8)
TIBC: 190 ug/dL — ABNORMAL LOW (ref 250–450)
UIBC: 155 ug/dL

## 2015-11-09 LAB — MRSA PCR SCREENING: MRSA BY PCR: POSITIVE — AB

## 2015-11-09 LAB — AMMONIA: Ammonia: 17 umol/L (ref 9–35)

## 2015-11-09 MED ORDER — GENTAMICIN SULFATE 0.1 % EX CREA
1.0000 "application " | TOPICAL_CREAM | Freq: Every day | CUTANEOUS | Status: DC
  Administered 2015-11-09: 1 via TOPICAL
  Filled 2015-11-09: qty 15

## 2015-11-09 MED ORDER — DARBEPOETIN ALFA 60 MCG/0.3ML IJ SOSY
60.0000 ug | PREFILLED_SYRINGE | INTRAMUSCULAR | Status: DC
  Administered 2015-11-10: 60 ug via SUBCUTANEOUS
  Filled 2015-11-09 (×2): qty 0.3

## 2015-11-09 MED ORDER — HEPARIN 1000 UNIT/ML FOR PERITONEAL DIALYSIS
2500.0000 [IU] | INTRAMUSCULAR | Status: DC | PRN
  Administered 2015-11-10: 2500 [IU] via INTRAPERITONEAL
  Filled 2015-11-09 (×6): qty 2.5

## 2015-11-09 MED ORDER — CLOPIDOGREL BISULFATE 75 MG PO TABS
75.0000 mg | ORAL_TABLET | Freq: Every day | ORAL | Status: DC
  Administered 2015-11-09 – 2015-11-10 (×2): 75 mg via ORAL
  Filled 2015-11-09 (×2): qty 1

## 2015-11-09 MED ORDER — MUPIROCIN 2 % EX OINT
1.0000 "application " | TOPICAL_OINTMENT | Freq: Two times a day (BID) | CUTANEOUS | Status: AC
  Administered 2015-11-09 – 2015-11-13 (×10): 1 via NASAL
  Filled 2015-11-09 (×2): qty 22

## 2015-11-09 MED ORDER — PANTOPRAZOLE SODIUM 40 MG PO TBEC
40.0000 mg | DELAYED_RELEASE_TABLET | Freq: Every day | ORAL | Status: DC
  Administered 2015-11-09 – 2015-11-10 (×2): 40 mg via ORAL
  Filled 2015-11-09 (×2): qty 1

## 2015-11-09 MED ORDER — SODIUM CHLORIDE 0.9 % IV SOLN: INTRAVENOUS | Status: DC

## 2015-11-09 MED ORDER — SIMVASTATIN 40 MG PO TABS
20.0000 mg | ORAL_TABLET | Freq: Every day | ORAL | Status: DC
  Administered 2015-11-09: 20 mg via ORAL
  Filled 2015-11-09: qty 1

## 2015-11-09 MED ORDER — CHLORHEXIDINE GLUCONATE CLOTH 2 % EX PADS
6.0000 | MEDICATED_PAD | Freq: Every day | CUTANEOUS | Status: AC
Start: 2015-11-09 — End: 2015-11-13
  Administered 2015-11-09 – 2015-11-13 (×5): 6 via TOPICAL

## 2015-11-09 MED ORDER — DELFLEX-LC/2.5% DEXTROSE 394 MOSM/L IP SOLN
INTRAPERITONEAL | Status: DC
  Administered 2015-11-09: 15000 mL via INTRAPERITONEAL

## 2015-11-09 MED ORDER — LEVOTHYROXINE SODIUM 50 MCG PO TABS
50.0000 ug | ORAL_TABLET | Freq: Every day | ORAL | Status: DC
  Administered 2015-11-10: 50 ug via ORAL
  Filled 2015-11-09 (×2): qty 1

## 2015-11-09 MED ORDER — PANTOPRAZOLE SODIUM 40 MG IV SOLR
40.0000 mg | Freq: Two times a day (BID) | INTRAVENOUS | Status: DC
Start: 2015-11-09 — End: 2015-11-09
  Administered 2015-11-09: 40 mg via INTRAVENOUS
  Filled 2015-11-09: qty 40

## 2015-11-09 MED ORDER — ASPIRIN EC 81 MG PO TBEC
81.0000 mg | DELAYED_RELEASE_TABLET | Freq: Every day | ORAL | Status: DC
  Administered 2015-11-09 – 2015-11-10 (×2): 81 mg via ORAL
  Filled 2015-11-09 (×2): qty 1

## 2015-11-09 NOTE — Procedures (Signed)
History: 60 year old female with a history of seizures who presented with status epilepticus with subsequent left-sided weakness.  Sedation: None  Technique: This is a 21 channel routine scalp EEG performed at the bedside with bipolar and monopolar montages arranged in accordance to the international 10/20 system of electrode placement. One channel was dedicated to EKG recording.    Background: There is a poorly sustained, poorly organized posterior dominant rhythm of 7 Hz which is seen bilaterally. There is also generalized irregular delta and theta activities, with a focal prominence in the right temporal region. There are also occasional sharp waves at T8 > F8 > P8.   Photic stimulation: Physiologic driving is not performed  EEG Abnormalities: 1) right temporal sharp waves 2) right focal irregular temporal delta activity 3) generalized irregular slow activity 4) slow PDR  Clinical Interpretation: This EEG is consistent with an area of epileptogenic potential in the right temporal lobe in the setting of a generalized nonspecific cerebral dysfunction (encephalopathy).  Roland Rack, MD [facility] [phone]  If 7pm- 7am, please page neurology on call as listed in [place].

## 2015-11-09 NOTE — Consult Note (Signed)
WOC wound consult note Reason for Consult: Differential between moisture associated skin damage and pressure injuries Wound type:Moisture associated skin damage, intertriginous dermatitis in the intragluteal cleft, incontinence associated skin damage at the perineum Pressure Ulcer POA: No Measurement: perineal areas partial thickness tissue loss:  0.8cm round x 0.1cm.  Pink, moist wound bed.  Intragluteal cleft:  3cm x 0.2cm x 0.1cm: Pink, moist and with scant serous exudate. Wound bed:AS described above Drainage (amount, consistency, odor): As described above Periwound:intact, dry.  Bilateral heels are intact Dressing procedure/placement/frequency:I will provide a mattress replacement with low air loss feature as patient is at risk for skin breakdown and has moisture management/microclimate issues.  Additionally, I will provide bilateral pressure distribution boots for her heels.  Topical wound and skin care orders are provided for Nursing for the perineum and intragluteal moisture wounds. [place] nursing team will not follow, but will remain available to this patient, the nursing and medical teams.  Please re-consult if needed. Thanks, Maudie Flakes, MSN, RN, [place], Arther Abbott  Pager# [phone]

## 2015-11-09 NOTE — Progress Notes (Signed)
Bedside EEG completed, results pending. 

## 2015-11-09 NOTE — Plan of Care (Signed)
Problem: Health Behavior: Goal: Compliance with prescribed medication regimen will improve Outcome: Progressing PTA patient had reportedly discontinued seizure medications. Upon inpatient admission, medications have been resumed.

## 2015-11-09 NOTE — Progress Notes (Signed)
[place] TEAM 1 - Stepdown/ICU TEAM  Sherri Davidson  FUX:[id number] DOB: [birth year] DOA: 11/08/2015 PCP: [facility]    Brief Narrative:  60 y.o. female with history of CVA, HTN, CHF, hypothyroidism, DM2, and ESRD on peritoneal hemodialysis daily who presented with seizures.  Around 1pm the patient was noted to have her first seizure that lasted 5-6 minutes with generalized tonic-clonic movements. EMS was called and patient was able to return to baseline, at which time she refused transport to the hospital. Around 2:18 PM she had a second seizure with acute left-sided facial drooping for which EMS was again called. Patient was very lethargic and altered after her seizing stoped. In the ambulance the patient had a third seizure. Patient was recently taken off of [place] by her Neurologist (Dr. Melrose Nakayama at [facility]). Dilantin was stopped in 06/2015 after the patient had repeated falls. Her last prior seizure activity was 05/2015.  In the ED CT and MRI of the brain showed previous right sided CVA, but no acute abnormalities. UA was positive. UDS negative. Patient was evaluated by Neurology who recommended continuation of Keppra 500 mg daily and Dilantin 100 mg 3 times a day.   Assessment & Plan:  Seizures / Todd's paralysis/ acute encephalopathy Slowly beginning to wake up - cont current sz meds IV  Possible Urinary tract infection Urine is indeed abnormal, but unclear how often pt urinates/if she makes regular urine - f/u culture - cont abx for now   DM2 CBG well controlled at this time   Hyperkalemia PD to resume today per Nephrology   End-stage renal disease on peritoneal dialysis Nephrology has been consulted   Pressure ulcer Wound care consulted  MRSA screen +  DVT prophylaxis: SQ heparin  Code Status: FULL CODE Family Communication: no family present at time of exam  Disposition Plan: SDU until mental status improved   Consultants:  Nephrology    Neurology   Procedures: none  Antimicrobials:  Ceftriaxone 5/27 >  Subjective: Patient is slowly becoming more alert.  She remains somewhat confused presently but can follow simple commands and provide a limited history.  She denies chest pain nausea vomiting or abdominal pain.  Objective: Blood pressure 129/70, pulse 73, temperature 97.8 F (36.6 C), temperature source Oral, resp. rate 14, height '5\' 6"'$  (1.676 m), weight 54.7 kg (120 lb 9.5 oz), SpO2 100 %. No intake or output data in the 24 hours ending 11/09/15 0828 Filed Weights   11/08/15 2227  Weight: 54.7 kg (120 lb 9.5 oz)    Examination: General: No acute respiratory distress Lungs: Clear to auscultation bilaterally without wheezes or crackles Cardiovascular: Regular rate and rhythm without murmur gallop or rub normal S1 and S2 Abdomen: Nontender, nondistended, soft, bowel sounds positive, no rebound, no ascites, no appreciable mass Extremities: No significant cyanosis, clubbing, or edema bilateral lower extremities  CBC:  Recent Labs Lab 11/08/15 1539 11/08/15 1559 11/09/15 0255  WBC  --  10.6* 8.4  NEUTROABS  --  9.0*  --   HGB 11.2* 10.5* 9.5*  HCT 33.0* 35.3* 31.4*  MCV  --  86.5 86.3  PLT  --  386 025   Basic Metabolic Panel:  Recent Labs Lab 11/08/15 1539 11/08/15 1559 11/09/15 0255  NA 140 137 141  K 5.0 5.3* 5.2*  CL 109 107 109  CO2  --  20* 22  GLUCOSE 176* 179* 120*  BUN 32* 30* 33*  CREATININE 5.30* 5.17* 5.54*  CALCIUM  --  9.2 8.9   GFR: Estimated Creatinine Clearance: 9.4 mL/min (by C-G formula based on Cr of 5.54).  Liver Function Tests:  Recent Labs Lab 11/08/15 1559  AST 32  ALT 14  ALKPHOS 98  BILITOT 0.3  PROT 7.1  ALBUMIN 2.1*    Recent Labs Lab 11/09/15 0635  AMMONIA 17    Coagulation Profile:  Recent Labs Lab 11/08/15 1559  INR 1.06    HbA1C: HEMOGLOBIN A1C  Date/Time Value Ref Range Status  05/31/2014 04:55 AM 8.0* 4.2-6.3 % Final    Comment:     The American Diabetes Association recommends that a primary goal of therapy should be <7% and that physicians should reevaluate the treatment regimen in patients with HbA1c values consistently >8%.     CBG:  Recent Labs Lab 11/08/15 2321 11/09/15 0334  GLUCAP 104* 119*    Recent Results (from the past 240 hour(s))  MRSA PCR Screening     Status: Abnormal   Collection Time: 11/08/15  9:58 PM  Result Value Ref Range Status   MRSA by PCR POSITIVE (A) NEGATIVE Final    Comment:        The GeneXpert MRSA Assay (FDA approved for NASAL specimens only), is one component of a comprehensive MRSA colonization surveillance program. It is not intended to diagnose MRSA infection nor to guide or monitor treatment for MRSA infections. RESULT CALLED TO, READ BACK BY AND VERIFIED WITH: [name],[name] 0055 11/09/15 MITCHELL,L      Scheduled Meds: . cefTRIAXone (ROCEPHIN) IVPB 1 gram/50 mL D5W  1 g Intravenous Q24H  . Chlorhexidine Gluconate Cloth  6 each Topical Q0600  . heparin  5,000 Units Subcutaneous Q8H  . insulin aspart  0-9 Units Subcutaneous Q4H  . levETIRAcetam  500 mg Intravenous Q24H  . mupirocin ointment  1 application Nasal BID  . pantoprazole (PROTONIX) IV  40 mg Intravenous Q12H  . phenytoin (DILANTIN) IV  100 mg Intravenous Q8H     LOS: 1 day   Time spent: 35 minutes   Cherene Altes, MD [facility] Office  [phone] Pager - Text Page per Amion as per below:  On-Call/Text Page:      [email or web address]      password [email or web address]  If 7PM-7AM, please contact night-coverage [email or web address] Password [email or web address] 11/09/2015, 8:28 AM

## 2015-11-09 NOTE — Consult Note (Addendum)
Reason for Consult: Continuity of ESRD care Referring Physician: Joette Catching M.D. [facility])  HPI:  60 year old Caucasian woman with past medical history significant for end-stage renal disease on peritoneal dialysis (followed by [facility] in [place]) from what appears to be underlying hypertension and diabetes. She also has CVA, hypothyroidism, history of congestive heart failure and seizure disorder. She was brought in after witnessed generalized tonic-clonic seizures and associated right-sided facial droop. She was recently taken off of Keppra possibly because of repeated dizziness and falls.  Further history could not be obtained from the patient as she is confused/lethargic-still post ictal.  Past Medical History  Diagnosis Date  . CVA, old, hemiparesis ([place])   . Hypertension   . Diabetes ([place])   . ESRD on peritoneal dialysis ([place])   . CHF (congestive heart failure) ([place])   . Hypothyroidism   . Seizures ([place])   . Stroke [facility])     Past Surgical History  Procedure Laterality Date  . Cholecystectomy    . Amputation toe    . Insertion of dialysis catheter      History reviewed. No pertinent family history.  Social History:  reports that she has quit smoking. Her smoking use included Cigarettes. She has a 30 pack-year smoking history. She does not have any smokeless tobacco history on file. She reports that she does not drink alcohol or use illicit drugs.  Allergies:  Allergies  Allergen Reactions  . Adhesive [Tape] Hives    Please use paper tape  . Salicylates Hives and Nausea Only    Uncoated.  Diona Fanti [Aspirin] Other (See Comments)    GI upset for non-enteric coated aspirin    Medications:  Scheduled: . aspirin EC  81 mg Oral Daily  . cefTRIAXone (ROCEPHIN) IVPB 1 gram/50 mL D5W  1 g Intravenous Q24H  . Chlorhexidine Gluconate Cloth  6 each Topical Q0600  . clopidogrel  75 mg Oral Daily  . dialysis solution 2.5% low-MG/low-CA   Intraperitoneal Q24H  .  gentamicin cream  1 application Topical Daily  . heparin  5,000 Units Subcutaneous Q8H  . insulin aspart  0-9 Units Subcutaneous Q4H  . levETIRAcetam  500 mg Intravenous Q24H  . levothyroxine  50 mcg Oral Daily  . mupirocin ointment  1 application Nasal BID  . pantoprazole  40 mg Oral Daily  . phenytoin (DILANTIN) IV  100 mg Intravenous Q8H  . simvastatin  20 mg Oral QHS    BMP Latest Ref Rng 11/09/2015 11/08/2015 11/08/2015  Glucose 65 - 99 mg/dL 120(H) 179(H) 176(H)  BUN 6 - 20 mg/dL 33(H) 30(H) 32(H)  Creatinine 0.44 - 1.00 mg/dL 5.54(H) 5.17(H) 5.30(H)  Sodium 135 - 145 mmol/L 141 137 140  Potassium 3.5 - 5.1 mmol/L 5.2(H) 5.3(H) 5.0  Chloride 101 - 111 mmol/L 109 107 109  CO2 22 - 32 mmol/L 22 20(L) -  Calcium 8.9 - 10.3 mg/dL 8.9 9.2 -   CBC Latest Ref Rng 11/09/2015 11/08/2015 11/08/2015  WBC 4.0 - 10.5 K/uL 8.4 10.6(H) -  Hemoglobin 12.0 - 15.0 g/dL 9.5(L) 10.5(L) 11.2(L)  Hematocrit 36.0 - 46.0 % 31.4(L) 35.3(L) 33.0(L)  Platelets 150 - 400 K/uL 356 386 -       Ct Head Wo Contrast  11/08/2015  CLINICAL DATA:  Seizure activity EXAM: CT HEAD WITHOUT CONTRAST TECHNIQUE: Contiguous axial images were obtained from the base of the skull through the vertex without intravenous contrast. COMPARISON:  09/12/2015 FINDINGS: The bony calvarium is intact. Heavy calcification of the  carotid arteries is again seen at the skullbase. Mild atrophic changes are seen. Changes of prior infarcts are noted in the distribution of the right middle cerebral artery. Lacunar infarct is again identified on the right in the basal ganglia. No findings to suggest acute hemorrhage, acute infarction or space-occupying mass lesion are noted. IMPRESSION: Chronic ischemic changes on the right. No acute abnormality is noted. These results were called by telephone at the time of interpretation on 11/08/2015 at 3:55 pm to Dr. Leonel Ramsay, who verbally acknowledged these results. Electronically Signed   By: Inez Catalina  M.D.   On: 11/08/2015 15:57   Mr Virgel Paling Wo Contrast  11/08/2015  CLINICAL DATA:  60 year old hypertensive diabetic female on dialysis. History of seizures. Off seizure medication for 3 days. Seizure this morning. Left-sided flaccid. Remote infarct. Subsequent encounter. EXAM: MRI HEAD WITHOUT CONTRAST MRA HEAD WITHOUT CONTRAST TECHNIQUE: Multiplanar, multiecho pulse sequences of the brain and surrounding structures were obtained without intravenous contrast. Angiographic images of the head were obtained using MRA technique without contrast. COMPARISON:  11/08/2015 head CT.  05/24/2015 brain MR. FINDINGS: MRI HEAD FINDINGS Exam is motion degraded. No acute infarct or intracranial hemorrhage. Remote large right hemispheric infarct with encephalomalacia right frontal lobe, parietal lobe and temporal lobe with subsequent dilation right temporal horn. Remote infarct right corona radiata/lenticular nucleus. Remote small infarct left caudate. Remote small infarct left thalamus. Prominent chronic microvascular changes. Global atrophy without hydrocephalus. No intracranial mass lesion noted on this unenhanced exam. Cervical medullary junction unremarkable. MRA HEAD FINDINGS Exam limited by motion degradation with markedly limited evaluation for grading stenosis and detecting aneurysm. All that can be stated with certainty is that there is flow within portions of the internal carotid arteries bilaterally, vertebral arteries and basilar artery. Findings suspicious for prominent intracranial atherosclerotic changes including significant stenosis left internal carotid artery cavernous segment, significant posterior cerebral artery narrowing greater on the left and decrease number of visualized right middle cerebral artery branch vessels (consistent with patient's remote right hemispheric infarct). IMPRESSION: MRI HEAD Exam is significantly motion degraded. No acute infarct or intracranial hemorrhage. Remote large right  hemispheric infarct. Remote infarct right corona radiata/lenticular nucleus. Remote small infarct left caudate. Remote small infarct left thalamus. Prominent chronic microvascular changes. Global atrophy without hydrocephalus. MRA HEAD Exam limited by motion degradation with markedly limited evaluation for grading stenosis and detecting aneurysm. All that can be stated with certainty is that there is flow within portions of the internal carotid arteries bilaterally, vertebral arteries and basilar artery. Findings suspicious for prominent intracranial atherosclerotic changes. Electronically Signed   By: Genia Del M.D.   On: 11/08/2015 18:33   Mr Brain Wo Contrast  11/08/2015  CLINICAL DATA:  60 year old hypertensive diabetic female on dialysis. History of seizures. Off seizure medication for 3 days. Seizure this morning. Left-sided flaccid. Remote infarct. Subsequent encounter. EXAM: MRI HEAD WITHOUT CONTRAST MRA HEAD WITHOUT CONTRAST TECHNIQUE: Multiplanar, multiecho pulse sequences of the brain and surrounding structures were obtained without intravenous contrast. Angiographic images of the head were obtained using MRA technique without contrast. COMPARISON:  11/08/2015 head CT.  05/24/2015 brain MR. FINDINGS: MRI HEAD FINDINGS Exam is motion degraded. No acute infarct or intracranial hemorrhage. Remote large right hemispheric infarct with encephalomalacia right frontal lobe, parietal lobe and temporal lobe with subsequent dilation right temporal horn. Remote infarct right corona radiata/lenticular nucleus. Remote small infarct left caudate. Remote small infarct left thalamus. Prominent chronic microvascular changes. Global atrophy without hydrocephalus. No intracranial mass lesion noted on  this unenhanced exam. Cervical medullary junction unremarkable. MRA HEAD FINDINGS Exam limited by motion degradation with markedly limited evaluation for grading stenosis and detecting aneurysm. All that can be stated  with certainty is that there is flow within portions of the internal carotid arteries bilaterally, vertebral arteries and basilar artery. Findings suspicious for prominent intracranial atherosclerotic changes including significant stenosis left internal carotid artery cavernous segment, significant posterior cerebral artery narrowing greater on the left and decrease number of visualized right middle cerebral artery branch vessels (consistent with patient's remote right hemispheric infarct). IMPRESSION: MRI HEAD Exam is significantly motion degraded. No acute infarct or intracranial hemorrhage. Remote large right hemispheric infarct. Remote infarct right corona radiata/lenticular nucleus. Remote small infarct left caudate. Remote small infarct left thalamus. Prominent chronic microvascular changes. Global atrophy without hydrocephalus. MRA HEAD Exam limited by motion degradation with markedly limited evaluation for grading stenosis and detecting aneurysm. All that can be stated with certainty is that there is flow within portions of the internal carotid arteries bilaterally, vertebral arteries and basilar artery. Findings suspicious for prominent intracranial atherosclerotic changes. Electronically Signed   By: Genia Del M.D.   On: 11/08/2015 18:33   Dg Chest Port 1 View  11/09/2015  CLINICAL DATA:  cough EXAM: PORTABLE CHEST - 1 VIEW COMPARISON:  CT 09/23/2015 and previous FINDINGS: Persistent right pleural effusion and right lower lung airspace opacity. Left lung clear. Heart size normal.  Atheromatous aorta. No pneumothorax. Visualized bones unremarkable. IMPRESSION: 1. Persistent right pleural effusion and right lower lung consolidation/atelectasis. Electronically Signed   By: Lucrezia Europe M.D.   On: 11/09/2015 09:36    Review of Systems  Unable to perform ROS: mental acuity   Blood pressure 144/89, pulse 74, temperature 97.4 F (36.3 C), temperature source Oral, resp. rate 18, height '5\' 6"'$  (1.676 m),  weight 54.7 kg (120 lb 9.5 oz), SpO2 97 %. Physical Exam  Nursing note and vitals reviewed. Constitutional: She appears well-developed.  Appears poorly nourished and dishevelled  HENT:  Head: Normocephalic.  Edentulous with dry oropharynx  Eyes: EOM are normal. Pupils are equal, round, and reactive to light. No scleral icterus.  Neck: Normal range of motion. Neck supple. No JVD present.  Cardiovascular: Normal rate and regular rhythm.   Murmur heard. 3/6 ejection systolic murmur  Respiratory: Effort normal and breath sounds normal. She has no wheezes. She has no rales.  GI: Soft. Bowel sounds are normal. There is no tenderness. There is no rebound and no guarding.  PD catheter exit site clean  Musculoskeletal: She exhibits no edema.  Skin: Skin is warm and dry. Rash noted.  Focal areas of excoriated skin-possible pruritic rash  Psychiatric:  The patient is somnolent and appears confused when awoken-without useful responses to questions.    Assessment/Plan: 1. Seizure: Appears to be secondary to recently discontinued antiseizure medication-ongoing evaluation and management by neurology. She still appears to be in the post ictal phase when seen. 2. End-stage renal disease: Resume peritoneal dialysis-unable to obtain clear details regarding PD prescription from the patient following which I called Dr. Holley Raring and discussed her prescription-this has been reordered. She is euvolemic and close to normotensive-use 2.5% dianeal. 3. Hyperkalemia: Mild, anticipate to improve with PD/providing clearance. Have been exacerbated by recent seizure. 4. Anemia: Anemia of chronic kidney disease, check iron studies and restart ESA. 5. Metabolic bone disease: Currently no consistent dietary intake, off binders at this time-restart when mental status back to baseline and able to take satisfactorily. We will need to  obtain more accurate medication list from her outpatient dialysis providers.   Travor Royce  K. 11/09/2015, 3:21 PM

## 2015-11-09 NOTE — Progress Notes (Addendum)
Subjective: Patient continues to improve  Exam: Filed Vitals:   11/09/15 1000 11/09/15 1200  BP: 124/63 144/89  Pulse: 72 74  Temp:  97.4 F (36.3 C)  Resp: 11 18   Gen: In bed, NAD Resp: non-labored breathing, no acute distress Abd: soft, nt  Neuro: MS: Awake, alert, continues to have some neglect of the left. She is verbalizing an answer some questions today. CN: Pupils equal round and reactive to light, she does not cross midline to the left with her eyes. Motor: She has significantly improved strength of the left side and is now moving it spontaneously and against gravity.  Sensory: Endorses symmetric sensation  Pertinent Labs: Elevated creatinine  Impression: 60 year old female with end-stage renal disease on dialysis who presented with status epilepticus following cessation of Keppra 3 days prior. I restarted Keppra and Dilantin given that she had had response to be in the past, however on further review of notes it seems that she was not tolerating these well. Given that she is improving, would continue this for now, but may need to try something different in the long-term.  Recommendations: 1) continue Keppra 500 mg daily 2) Continue Dilantin 100 3 times a day 3) EEG given the patient has still not returned to baseline.  Roland Rack, MD [facility] [phone]  If 7pm- 7am, please page neurology on call as listed in [place].

## 2015-11-10 LAB — CBC
HEMATOCRIT: 33.8 % — AB (ref 36.0–46.0)
HEMOGLOBIN: 10.2 g/dL — AB (ref 12.0–15.0)
MCH: 26.1 pg (ref 26.0–34.0)
MCHC: 30.2 g/dL (ref 30.0–36.0)
MCV: 86.4 fL (ref 78.0–100.0)
Platelets: 371 10*3/uL (ref 150–400)
RBC: 3.91 MIL/uL (ref 3.87–5.11)
RDW: 20.8 % — AB (ref 11.5–15.5)
WBC: 7.3 10*3/uL (ref 4.0–10.5)

## 2015-11-10 LAB — RENAL FUNCTION PANEL
ALBUMIN: 2.1 g/dL — AB (ref 3.5–5.0)
ANION GAP: 10 (ref 5–15)
BUN: 30 mg/dL — AB (ref 6–20)
CHLORIDE: 110 mmol/L (ref 101–111)
CO2: 22 mmol/L (ref 22–32)
Calcium: 8.8 mg/dL — ABNORMAL LOW (ref 8.9–10.3)
Creatinine, Ser: 5.51 mg/dL — ABNORMAL HIGH (ref 0.44–1.00)
GFR, EST AFRICAN AMERICAN: 9 mL/min — AB (ref >60)
GFR, EST NON AFRICAN AMERICAN: 8 mL/min — AB (ref >60)
Glucose, Bld: 136 mg/dL — ABNORMAL HIGH (ref 65–99)
PHOSPHORUS: 6.5 mg/dL — AB (ref 2.5–4.6)
POTASSIUM: 4.2 mmol/L (ref 3.5–5.1)
Sodium: 142 mmol/L (ref 135–145)

## 2015-11-10 LAB — GLUCOSE, CAPILLARY
GLUCOSE-CAPILLARY: 132 mg/dL — AB (ref 65–99)
GLUCOSE-CAPILLARY: 124 mg/dL — AB (ref 65–99)
Glucose-Capillary: 140 mg/dL — ABNORMAL HIGH (ref 65–99)
Glucose-Capillary: 80 mg/dL (ref 65–99)

## 2015-11-10 MED ORDER — DELFLEX-LC/2.5% DEXTROSE 394 MOSM/L IP SOLN
INTRAPERITONEAL | Status: DC
  Administered 2015-11-10: 15000 mL via INTRAPERITONEAL
  Administered 2015-11-12: 23:00:00 via INTRAPERITONEAL
  Administered 2015-11-12: 5000 mL via INTRAPERITONEAL

## 2015-11-10 MED ORDER — LEVETIRACETAM 500 MG PO TABS: 500.0000 mg | ORAL_TABLET | ORAL | Status: DC | PRN

## 2015-11-10 MED ORDER — SODIUM CHLORIDE 0.9 % IV SOLN
250.0000 mg | Freq: Once | INTRAVENOUS | Status: AC
  Administered 2015-11-10: 250 mg via INTRAVENOUS
  Filled 2015-11-10: qty 20

## 2015-11-10 MED ORDER — LANTHANUM CARBONATE 500 MG PO CHEW
1000.0000 mg | CHEWABLE_TABLET | Freq: Three times a day (TID) | ORAL | Status: DC
  Administered 2015-11-10 (×2): 1000 mg via ORAL
  Filled 2015-11-10 (×2): qty 2

## 2015-11-10 MED ORDER — GENTAMICIN SULFATE 0.1 % EX CREA
1.0000 "application " | TOPICAL_CREAM | Freq: Every day | CUTANEOUS | Status: DC
  Administered 2015-11-10 – 2015-11-18 (×13): 1 via TOPICAL
  Filled 2015-11-10: qty 15

## 2015-11-10 MED ORDER — LEVOTHYROXINE SODIUM 100 MCG IV SOLR
25.0000 ug | Freq: Every day | INTRAVENOUS | Status: DC
  Administered 2015-11-11 – 2015-11-12 (×2): 25 ug via INTRAVENOUS
  Filled 2015-11-10 (×2): qty 5

## 2015-11-10 MED ORDER — RENA-VITE PO TABS: 1.0000 | ORAL_TABLET | Freq: Every day | ORAL | Status: DC

## 2015-11-10 NOTE — Progress Notes (Signed)
PT Cancellation Note  Patient Details Name: Sherri Davidson MRN: [id number] DOB: [birth year]   Cancelled Treatment:    Reason Eval/Treat Not Completed: Patient at procedure or test/unavailable (pt currently on dialysis ).  PT will follow.  Collie Siad PT, DPT  Pager: [phone] Phone: [phone] 11/10/2015, 9:33 AM

## 2015-11-10 NOTE — Clinical Social Work Note (Signed)
Clinical Social Work Assessment  Patient Details  Name: Sherri Davidson MRN: [id number] Date of Birth: 1956/01/15  Date of referral:  11/10/15               Reason for consult:  Facility Placement                Permission sought to share information with:  Family Supports Permission granted to share information::  No  Name::     Horticulturist, commercial::  [facility]  Relationship::  sister/HCPOA  Contact Information:     Housing/Transportation Living arrangements for the past 2 months:  Single Family Home Source of Information:  Other (Comment Required) (sibling) Patient Interpreter Needed:  None Criminal Activity/Legal Involvement Pertinent to Current Situation/Hospitalization:  No - Comment as needed Significant Relationships:  Siblings Lives with:  Siblings Do you feel safe going back to the place where you live?  Yes Need for family participation in patient care:  Yes (Comment) (ADL assistance)  Care giving concerns:  Pt lives with sister who is primary caregiver- per chart review pt sister has been caregiver for 7 years.  Pt sister feels comfortable continuing care for pt as long as pt isn't too deconditioned.   Social Worker assessment / plan:  CSW spoke with pt sister concerning pt current condition.  Pt sister states that prior to admission pt was able to set self up in bed, mobilize in wheelchair and some in a rolling walker- pt sister responsible for cleaning up patient, cooking, etc.  Pt [facility] also [facility] and received [facility] there after last admission.  Pt sister would like pt to get therapy prior to return home if possible so that it could relieve some caregiver burden- feels badly that pt becomes bruised from the amount of pressure it takes to get her up and around.  CSW paged PT and informed of what sister said about pt baseline mobility  Employment status:  Retired Nurse, adult PT Recommendations:  [place] / Referral to community resources:  [place]  Patient/Family's Response to care:  Pt sister is agreeable to SNF or CIR placement if pt can be admitted- CSW explained that peritoneal dialysis could be a barrier (during December admission pt was being considered for CIR because could not be placed due to peritoneal.  Patient/Family's Understanding of and Emotional Response to Diagnosis, Current Treatment, and Prognosis:  No questions or concerns- pt sister has been caregiver for a long time and is comfortable with her care- hopeful pt can regain some strength prior to returning home.  Emotional Assessment Appearance:  Appears stated age Attitude/Demeanor/Rapport:  Unable to Assess Affect (typically observed):  Unable to Assess Orientation:  Oriented to Self, Oriented to Place Alcohol / Substance use:  Not Applicable Psych involvement (Current and /or in the community):  No (Comment)  Discharge Needs  Concerns to be addressed:  Care Coordination, Home Safety Concerns Readmission within the last 30 days:  No Current discharge risk:  Physical Impairment Barriers to Discharge:  Continued Medical Work up   Cranford Mon, LCSW 11/10/2015, 4:08 PM

## 2015-11-10 NOTE — Progress Notes (Signed)
Patient evaluated with physician assistant, Etta Quill, and neurology nurse, April Pugh.   Sherri Davidson is an 60 y.o. female patient who was admitted with  seizures, currently stable from neurological standpoint with no further seizures, tolerating seizure medications Keppra 500 mg twice a day and Dilantin 100 mg 3 times a day without any problems. She has end-stage renal failure, on peritoneal dialysis. Her blood pressure on monitor was low with systolics in 25K when I entered the patient room to evaluate her. She was alert, able to respond to simple questions and follows one-step commands. Not sure if the low blood pressure reading on monitor was a technical error, advised the nurse to recheck blood pressure manually with Dopplers, and speak to hospitalist physician regarding further monitoring and management of the blood pressure. She is on IV fluids, rate increased.     For now continue same dose of seizure medications. No other new recommendations from neurology service otherwise. We'll sign off, please call for any further questions.    Past medical history: Past Medical History  Diagnosis Date  . CVA, old, hemiparesis ([place])   . Hypertension   . Diabetes ([place])   . ESRD on peritoneal dialysis ([place])   . CHF (congestive heart failure) ([place])   . Hypothyroidism   . Seizures ([place])   . Stroke [facility])     Past Surgical History  Procedure Laterality Date  . Cholecystectomy    . Amputation toe    . Insertion of dialysis catheter      Family History: History reviewed. No pertinent family history.  Social History:   reports that she has quit smoking. Her smoking use included Cigarettes. She has a 30 pack-year smoking history. She does not have any smokeless tobacco history on file. She reports that she does not drink alcohol or use illicit drugs.  Allergies:  Allergies  Allergen Reactions  . Adhesive [Tape] Hives    Please use paper tape  . Salicylates Hives and Nausea Only     Uncoated.  Diona Fanti [Aspirin] Other (See Comments)    GI upset for non-enteric coated aspirin    Medications:   Current facility-administered medications:  .  [DISCONTINUED] acetaminophen (TYLENOL) tablet 650 mg, 650 mg, Oral, Q6H PRN **OR** acetaminophen (TYLENOL) suppository 650 mg, 650 mg, Rectal, Q6H PRN, Norval Morton, MD .  albuterol (PROVENTIL) (2.5 MG/3ML) 0.083% nebulizer solution 2.5 mg, 2.5 mg, Nebulization, Q2H PRN, Norval Morton, MD .  cefTRIAXone (ROCEPHIN) 1 g in dextrose 5 % 50 mL IVPB, 1 g, Intravenous, Q24H, Cherene Altes, MD, 1 g at 11/09/15 1841 .  Chlorhexidine Gluconate Cloth 2 % PADS 6 each, 6 each, Topical, Q0600, Norval Morton, MD, 6 each at 11/10/15 0546 .  Darbepoetin Alfa (ARANESP) injection 60 mcg, 60 mcg, Subcutaneous, Q Mon-1800, Elmarie Shiley, MD, 60 mcg at 11/10/15 1807 .  dialysis solution 2.5% low-MG/low-CA dianeal solution, , Intraperitoneal, Q24H, Elmarie Shiley, MD .  gentamicin cream (GARAMYCIN) 0.1 % 1 application, 1 application, Topical, Daily, Elmarie Shiley, MD .  heparin 1000 unit/ml injection 2,500 Units, 2,500 Units, Intraperitoneal, PRN, Elmarie Shiley, MD .  heparin injection 5,000 Units, 5,000 Units, Subcutaneous, Q8H, Norval Morton, MD, 5,000 Units at 11/10/15 1331 .  insulin aspart (novoLOG) injection 0-9 Units, 0-9 Units, Subcutaneous, Q4H, Norval Morton, MD, 1 Units at 11/10/15 1630 .  levETIRAcetam (KEPPRA) 500 mg in sodium chloride 0.9 % 100 mL IVPB, 500 mg, Intravenous, Q24H, Greta Doom, MD, 500 mg  at 11/10/15 1100 .  [START ON 11/11/2015] levothyroxine (SYNTHROID, LEVOTHROID) injection 25 mcg, 25 mcg, Intravenous, QAC breakfast, Cherene Altes, MD .  LORazepam (ATIVAN) injection 1-2 mg, 1-2 mg, Intravenous, Q2H PRN, Norval Morton, MD .  mupirocin ointment (BACTROBAN) 2 % 1 application, 1 application, Nasal, BID, Norval Morton, MD, 1 application at 38/46/65 1101 .  [DISCONTINUED] ondansetron (ZOFRAN) tablet 4 mg, 4 mg, Oral, Q6H  PRN **OR** ondansetron (ZOFRAN) injection 4 mg, 4 mg, Intravenous, Q6H PRN, Norval Morton, MD .  phenytoin (DILANTIN) injection 100 mg, 100 mg, Intravenous, Q8H, Greta Doom, MD, 100 mg at 11/10/15 1332  Lab Results: Basic Metabolic Panel:  Recent Labs Lab 11/08/15 1539 11/08/15 1559 11/09/15 0255 11/10/15 0227  NA 140 137 141 142  K 5.0 5.3* 5.2* 4.2  CL 109 107 109 110  CO2  --  20* 22 22  GLUCOSE 176* 179* 120* 136*  BUN 32* 30* 33* 30*  CREATININE 5.30* 5.17* 5.54* 5.51*  CALCIUM  --  9.2 8.9 8.8*  PHOS  --   --   --  6.5*    Liver Function Tests:  Recent Labs Lab 11/08/15 1559 11/10/15 0227  AST 32  --   ALT 14  --   ALKPHOS 98  --   BILITOT 0.3  --   PROT 7.1  --   ALBUMIN 2.1* 2.1*   No results for input(s): LIPASE, AMYLASE in the last 168 hours.  Recent Labs Lab 11/09/15 0635  AMMONIA 17    CBC:  Recent Labs Lab 11/08/15 1539 11/08/15 1559 11/09/15 0255 11/10/15 0227  WBC  --  10.6* 8.4 7.3  NEUTROABS  --  9.0*  --   --   HGB 11.2* 10.5* 9.5* 10.2*  HCT 33.0* 35.3* 31.4* 33.8*  MCV  --  86.5 86.3 86.4  PLT  --  386 356 371    Cardiac Enzymes: No results for input(s): CKTOTAL, CKMB, CKMBINDEX, TROPONINI in the last 168 hours.  Lipid Panel: No results for input(s): CHOL, TRIG, HDL, CHOLHDL, VLDL, LDLCALC in the last 168 hours.  CBG:  Recent Labs Lab 11/09/15 2053 11/09/15 2343 11/10/15 0400 11/10/15 0758 11/10/15 1626  GLUCAP 87 98 132* 140* 124*    Microbiology: Results for orders placed or performed during the hospital encounter of 11/08/15  MRSA PCR Screening     Status: Abnormal   Collection Time: 11/08/15  9:58 PM  Result Value Ref Range Status   MRSA by PCR POSITIVE (A) NEGATIVE Final    Comment:        The GeneXpert MRSA Assay (FDA approved for NASAL specimens only), is one component of a comprehensive MRSA colonization surveillance program. It is not intended to diagnose MRSA infection nor to guide  or monitor treatment for MRSA infections. RESULT CALLED TO, READ BACK BY AND VERIFIED WITH: [name],[name] 0055 11/09/15 MITCHELL,L      Imaging: Ct Head Wo Contrast  11/08/2015  CLINICAL DATA:  Seizure activity EXAM: CT HEAD WITHOUT CONTRAST TECHNIQUE: Contiguous axial images were obtained from the base of the skull through the vertex without intravenous contrast. COMPARISON:  09/12/2015 FINDINGS: The bony calvarium is intact. Heavy calcification of the carotid arteries is again seen at the skullbase. Mild atrophic changes are seen. Changes of prior infarcts are noted in the distribution of the right middle cerebral artery. Lacunar infarct is again identified on the right in the basal ganglia. No findings to suggest acute hemorrhage, acute infarction or  space-occupying mass lesion are noted. IMPRESSION: Chronic ischemic changes on the right. No acute abnormality is noted. These results were called by telephone at the time of interpretation on 11/08/2015 at 3:55 pm to Dr. Leonel Ramsay, who verbally acknowledged these results. Electronically Signed   By: Inez Catalina M.D.   On: 11/08/2015 15:57   Mr Virgel Paling Wo Contrast  11/08/2015  CLINICAL DATA:  60 year old hypertensive diabetic female on dialysis. History of seizures. Off seizure medication for 3 days. Seizure this morning. Left-sided flaccid. Remote infarct. Subsequent encounter. EXAM: MRI HEAD WITHOUT CONTRAST MRA HEAD WITHOUT CONTRAST TECHNIQUE: Multiplanar, multiecho pulse sequences of the brain and surrounding structures were obtained without intravenous contrast. Angiographic images of the head were obtained using MRA technique without contrast. COMPARISON:  11/08/2015 head CT.  05/24/2015 brain MR. FINDINGS: MRI HEAD FINDINGS Exam is motion degraded. No acute infarct or intracranial hemorrhage. Remote large right hemispheric infarct with encephalomalacia right frontal lobe, parietal lobe and temporal lobe with subsequent dilation right temporal horn.  Remote infarct right corona radiata/lenticular nucleus. Remote small infarct left caudate. Remote small infarct left thalamus. Prominent chronic microvascular changes. Global atrophy without hydrocephalus. No intracranial mass lesion noted on this unenhanced exam. Cervical medullary junction unremarkable. MRA HEAD FINDINGS Exam limited by motion degradation with markedly limited evaluation for grading stenosis and detecting aneurysm. All that can be stated with certainty is that there is flow within portions of the internal carotid arteries bilaterally, vertebral arteries and basilar artery. Findings suspicious for prominent intracranial atherosclerotic changes including significant stenosis left internal carotid artery cavernous segment, significant posterior cerebral artery narrowing greater on the left and decrease number of visualized right middle cerebral artery branch vessels (consistent with patient's remote right hemispheric infarct). IMPRESSION: MRI HEAD Exam is significantly motion degraded. No acute infarct or intracranial hemorrhage. Remote large right hemispheric infarct. Remote infarct right corona radiata/lenticular nucleus. Remote small infarct left caudate. Remote small infarct left thalamus. Prominent chronic microvascular changes. Global atrophy without hydrocephalus. MRA HEAD Exam limited by motion degradation with markedly limited evaluation for grading stenosis and detecting aneurysm. All that can be stated with certainty is that there is flow within portions of the internal carotid arteries bilaterally, vertebral arteries and basilar artery. Findings suspicious for prominent intracranial atherosclerotic changes. Electronically Signed   By: Genia Del M.D.   On: 11/08/2015 18:33   Mr Brain Wo Contrast  11/08/2015  CLINICAL DATA:  60 year old hypertensive diabetic female on dialysis. History of seizures. Off seizure medication for 3 days. Seizure this morning. Left-sided flaccid. Remote  infarct. Subsequent encounter. EXAM: MRI HEAD WITHOUT CONTRAST MRA HEAD WITHOUT CONTRAST TECHNIQUE: Multiplanar, multiecho pulse sequences of the brain and surrounding structures were obtained without intravenous contrast. Angiographic images of the head were obtained using MRA technique without contrast. COMPARISON:  11/08/2015 head CT.  05/24/2015 brain MR. FINDINGS: MRI HEAD FINDINGS Exam is motion degraded. No acute infarct or intracranial hemorrhage. Remote large right hemispheric infarct with encephalomalacia right frontal lobe, parietal lobe and temporal lobe with subsequent dilation right temporal horn. Remote infarct right corona radiata/lenticular nucleus. Remote small infarct left caudate. Remote small infarct left thalamus. Prominent chronic microvascular changes. Global atrophy without hydrocephalus. No intracranial mass lesion noted on this unenhanced exam. Cervical medullary junction unremarkable. MRA HEAD FINDINGS Exam limited by motion degradation with markedly limited evaluation for grading stenosis and detecting aneurysm. All that can be stated with certainty is that there is flow within portions of the internal carotid arteries bilaterally, vertebral arteries and basilar  artery. Findings suspicious for prominent intracranial atherosclerotic changes including significant stenosis left internal carotid artery cavernous segment, significant posterior cerebral artery narrowing greater on the left and decrease number of visualized right middle cerebral artery branch vessels (consistent with patient's remote right hemispheric infarct). IMPRESSION: MRI HEAD Exam is significantly motion degraded. No acute infarct or intracranial hemorrhage. Remote large right hemispheric infarct. Remote infarct right corona radiata/lenticular nucleus. Remote small infarct left caudate. Remote small infarct left thalamus. Prominent chronic microvascular changes. Global atrophy without hydrocephalus. MRA HEAD Exam limited  by motion degradation with markedly limited evaluation for grading stenosis and detecting aneurysm. All that can be stated with certainty is that there is flow within portions of the internal carotid arteries bilaterally, vertebral arteries and basilar artery. Findings suspicious for prominent intracranial atherosclerotic changes. Electronically Signed   By: Genia Del M.D.   On: 11/08/2015 18:33   Dg Chest Port 1 View  11/09/2015  CLINICAL DATA:  cough EXAM: PORTABLE CHEST - 1 VIEW COMPARISON:  CT 09/23/2015 and previous FINDINGS: Persistent right pleural effusion and right lower lung airspace opacity. Left lung clear. Heart size normal.  Atheromatous aorta. No pneumothorax. Visualized bones unremarkable. IMPRESSION: 1. Persistent right pleural effusion and right lower lung consolidation/atelectasis. Electronically Signed   By: Lucrezia Europe M.D.   On: 11/09/2015 09:36

## 2015-11-10 NOTE — Evaluation (Signed)
Physical Therapy Evaluation Patient Details Name: Sherri Davidson MRN: [id number] DOB: August 14, 1955 Today's Date: 11/10/2015   History of Present Illness  Pt is a 60 year old Caucasian woman with past medical history significant for end-stage renal disease on peritoneal dialysis (followed by [facility] in [place]) from what appears to be underlying hypertension and diabetes. She also has CVA, hypothyroidism, history of congestive heart failure and seizure disorder. She was brought in after witnessed generalized tonic-clonic seizures and associated right-sided facial droop. She was recently taken off of Keppra possibly because of repeated dizziness and falls.    Clinical Impression  Pt admitted with above diagnosis. Pt currently with functional limitations due to the deficits listed below (see PT Problem List). Unsure of pt's PLOF as family not present.  Pt reports she was ambulatory using RW and cane PTA and performing ADLs w/ assist of her sister, however she is a poor historian often providing conflicting information. Per PT order, pt nonambulatory at baseline. Sherri Davidson presents w/ impaired cognition, increased tone, generalized weakness, and is quick to fatigue sitting EOB, requiring up to mod assist.  Given pt's current functional status and inability to confirm amount of assist at home, recommending SNF at d/c.  Pt will benefit from skilled PT to increase their independence and safety with mobility to allow discharge to the venue listed below.      Follow Up Recommendations SNF;Supervision/Assistance - 24 hour    Equipment Recommendations  Other (comment) (TBD at next venue of care)    Recommendations for Other Services OT consult;Speech consult     Precautions / Restrictions Precautions Precautions: Fall;Other (comment) Precaution Comments: seizure Restrictions Weight Bearing Restrictions: No      Mobility  Bed Mobility Overal bed mobility: Needs Assistance Bed  Mobility: Supine to Sit;Sit to Supine     Supine to sit: Mod assist;HOB elevated Sit to supine: Max assist   General bed mobility comments: Multimodal cues to initiate supine>sit, pt reaching out for therapist hand to pull up to sitting, requiring additional assist to elevate trunk.    Transfers                 General transfer comment: Did not attempt as pt unable to maintain upright sitting EOB  Ambulation/Gait                Stairs            Wheelchair Mobility    Modified Rankin (Stroke Patients Only)       Balance Overall balance assessment: Needs assistance Sitting-balance support: Bilateral upper extremity supported;Feet supported Sitting balance-Leahy Scale: Poor Sitting balance - Comments: Close min guard for initial ~3 minutes sitting EOB. Requires mod assist as pt fatigues quickly sitting EOB. Postural control: Left lateral lean                                   Pertinent Vitals/Pain Pain Assessment: No/denies pain    Home Living Family/patient expects to be discharged to:: Private residence Living Arrangements: Other relatives (sister) Available Help at Discharge: Family;Available 24 hours/day Type of Home: House Home Access: Stairs to enter Entrance Stairs-Rails: Can reach both;Left;Right Entrance Stairs-Number of Steps: 4 Home Layout: One level Home Equipment: Walker - 2 wheels;Shower seat;Hand held shower head;Cane - single point;Wheelchair - power Additional Comments: Pt is a poor historian, not sure of reliability of information provided as no family present.    Prior  Function Level of Independence: Needs assistance   Gait / Transfers Assistance Needed: Pt says she uses a RW and cane to ambulate, although PT order indicates she is non-ambulatory at baseline  ADL's / Homemaking Assistance Needed: Needs assist from sister w/ bathing dressing  Comments: Pt is a poor historian, not sure of reliability of information  provided as no family present.     Hand Dominance        Extremity/Trunk Assessment   Upper Extremity Assessment: Defer to OT evaluation           Lower Extremity Assessment: RLE deficits/detail;LLE deficits/detail RLE Deficits / Details: Increased tone hamstrings.  Lacking ~30 deg knee extension.  Inversion and PF resting position.  LLE Deficits / Details: Increased tone and lacking ~35 deg knee extension.   Cervical / Trunk Assessment: Kyphotic  Communication   Communication: Expressive difficulties  Cognition Arousal/Alertness: Awake/alert Behavior During Therapy: Flat affect Overall Cognitive Status: No family/caregiver present to determine baseline cognitive functioning Area of Impairment: Orientation;Attention;Memory;Following commands;Safety/judgement;Awareness;Problem solving Orientation Level: Disoriented to;Time;Situation Current Attention Level: Focused Memory: Decreased short-term memory Following Commands: Follows one step commands inconsistently Safety/Judgement: Decreased awareness of safety;Decreased awareness of deficits Awareness: Intellectual Problem Solving: Slow processing;Decreased initiation;Requires verbal cues      General Comments General comments (skin integrity, edema, etc.): Poor eye tracking in Lt quadrants.  Pt looks around in bed for call bell when this PT holding it directly in front of her w/ prompting.  Pt takes sip of water at end of session and was coughing for at least 5 minutes following.  RN notified of needed Speech Evaluation and water placed out of reach of pt.    Exercises General Exercises - Lower Extremity Ankle Circles/Pumps: PROM;Both;10 reps;Seated Quad Sets: AAROM;Both;5 reps;Seated      Assessment/Plan    PT Assessment Patient needs continued PT services  PT Diagnosis Difficulty walking;Generalized weakness;Altered mental status   PT Problem List Decreased strength;Decreased range of motion;Decreased activity  tolerance;Decreased balance;Decreased mobility;Decreased coordination;Decreased cognition;Decreased knowledge of use of DME;Decreased safety awareness;Impaired tone  PT Treatment Interventions DME instruction;Gait training;Functional mobility training;Therapeutic activities;Therapeutic exercise;Balance training;Neuromuscular re-education;Cognitive remediation;Patient/family education;Modalities   PT Goals (Current goals can be found in the Care Plan section) Acute Rehab PT Goals Patient Stated Goal: none stated PT Goal Formulation: Patient unable to participate in goal setting Time For Goal Achievement: 11/24/15 Potential to Achieve Goals: Good    Frequency Min 3X/week   Barriers to discharge   No family present to confirm amount of assist available at home    Co-evaluation               End of Session   Activity Tolerance: Patient limited by fatigue Patient left: in bed;with call bell/phone within reach;with bed alarm set Nurse Communication: Mobility status;Other (comment);Need for lift equipment (pt needs speech therapy evaluation)         Time: 9417-4081 PT Time Calculation (min) (ACUTE ONLY): 23 min   Charges:   PT Evaluation $PT Eval Moderate Complexity: 1 Procedure PT Treatments $Therapeutic Activity: 8-22 mins   PT G Codes:       Collie Siad PT, DPT  Pager: [phone] Phone: [phone] 11/10/2015, 11:10 AM

## 2015-11-10 NOTE — Progress Notes (Signed)
[place] TEAM 1 - Stepdown/ICU TEAM  ASHMI BLAS  PPJ:[id number] DOB: 04/18/1956 DOA: 11/08/2015 PCP: [facility]    Brief Narrative:  60 y.o. female with history of CVA, HTN, CHF, hypothyroidism, DM2, and ESRD on peritoneal hemodialysis daily who presented with seizures.  Around 1pm the patient was noted to have her first seizure that lasted 5-6 minutes with generalized tonic-clonic movements. EMS was called and patient was able to return to baseline, at which time she refused transport to the hospital. Around 2:18 PM she had a second seizure with acute left-sided facial drooping for which EMS was again called. Patient was very lethargic and altered after her seizing stoped. In the ambulance the patient had a third seizure. Patient was recently taken off of [place] by her Neurologist (Dr. Melrose Nakayama at [facility]). Dilantin was stopped in 06/2015 after the patient had repeated falls. Her last prior seizure activity was 05/2015.  In the ED CT and MRI of the brain showed previous right sided CVA, but no acute abnormalities. UA was positive. UDS negative. Patient was evaluated by Neurology who recommended continuation of Keppra 500 mg daily and Dilantin 100 mg 3 times a day.   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 Pt is more awake, but remains quite confused - cont supportive care   Dysphagia Pt was observed to cough multiple time/appear to choke when consuming clear liquids - change to NPO status - SLP eval - may simple improve as/if mental status does   Possible Urinary tract infection Urine is abnormal, but unclear how often pt urinates/if she makes regular urine - culture does not appear to have been sent - will d/c abx after 3 days of tx and follow clinically    DM2 CBG well controlled   Hyperkalemia Resolved w/ resumption of PD  End-stage renal disease on peritoneal dialysis PD per Nephrology   Pressure ulcer v/s  MASD See WOC note for wound info and tx recs   MRSA screen +  DVT prophylaxis: SQ heparin  Code Status: FULL CODE Family Communication: no family present at time of exam  Disposition Plan: SDU until mental status improved   Consultants:  Nephrology  Neurology   Procedures: none  Antimicrobials:  Ceftriaxone 5/27 >  Subjective: The patient is alert but drowsy.  She cannot tell me where she is or why she is here.  She does not know the year.    Of note the patient's nurse left a message stating that the patient's primary care physician wished to receive a call, but presently the patient is not able to give me permission to share health information and therefore I'm unable to make a return call.  Objective: Blood pressure 91/75, pulse 81, temperature 97.8 F (36.6 C), temperature source Oral, resp. rate 14, height '5\' 6"'$  (1.676 m), weight 54.7 kg (120 lb 9.5 oz), SpO2 98 %.  Intake/Output Summary (Last 24 hours) at 11/10/15 1353 Last data filed at 11/10/15 1002  Gross per 24 hour  Intake      0 ml  Output   1326 ml  Net  -1326 ml   Filed Weights   11/08/15 2227  Weight: 54.7 kg (120 lb 9.5 oz)    Examination: General: No acute respiratory distress - alert but confused  Lungs: Clear to auscultation bilaterally - no wheezes or crackles Cardiovascular: Regular rate and rhythm without murmur gallop or rub  Abdomen: Nontender, nondistended, soft, bowel sounds positive, PD  cath insertion site clean an dry  Extremities: No significant cyanosis, clubbing, or edema bilateral lower extremities  CBC:  Recent Labs Lab 11/08/15 1539 11/08/15 1559 11/09/15 0255 11/10/15 0227  WBC  --  10.6* 8.4 7.3  NEUTROABS  --  9.0*  --   --   HGB 11.2* 10.5* 9.5* 10.2*  HCT 33.0* 35.3* 31.4* 33.8*  MCV  --  86.5 86.3 86.4  PLT  --  386 356 400   Basic Metabolic Panel:  Recent Labs Lab 11/08/15 1539 11/08/15 1559 11/09/15 0255 11/10/15 0227  NA 140 137 141 142  K 5.0 5.3* 5.2*  4.2  CL 109 107 109 110  CO2  --  20* 22 22  GLUCOSE 176* 179* 120* 136*  BUN 32* 30* 33* 30*  CREATININE 5.30* 5.17* 5.54* 5.51*  CALCIUM  --  9.2 8.9 8.8*  PHOS  --   --   --  6.5*   GFR: Estimated Creatinine Clearance: 9.5 mL/min (by C-G formula based on Cr of 5.51).  Liver Function Tests:  Recent Labs Lab 11/08/15 1559 11/10/15 0227  AST 32  --   ALT 14  --   ALKPHOS 98  --   BILITOT 0.3  --   PROT 7.1  --   ALBUMIN 2.1* 2.1*    Recent Labs Lab 11/09/15 0635  AMMONIA 17    Coagulation Profile:  Recent Labs Lab 11/08/15 1559  INR 1.06    HbA1C: HEMOGLOBIN A1C  Date/Time Value Ref Range Status  05/31/2014 04:55 AM 8.0* 4.2-6.3 % Final    Comment:    The American Diabetes Association recommends that a primary goal of therapy should be <7% and that physicians should reevaluate the treatment regimen in patients with HbA1c values consistently >8%.     CBG:  Recent Labs Lab 11/09/15 1636 11/09/15 2053 11/09/15 2343 11/10/15 0400 11/10/15 0758  GLUCAP 91 87 98 132* 140*    Recent Results (from the past 240 hour(s))  MRSA PCR Screening     Status: Abnormal   Collection Time: 11/08/15  9:58 PM  Result Value Ref Range Status   MRSA by PCR POSITIVE (A) NEGATIVE Final    Comment:        The GeneXpert MRSA Assay (FDA approved for NASAL specimens only), is one component of a comprehensive MRSA colonization surveillance program. It is not intended to diagnose MRSA infection nor to guide or monitor treatment for MRSA infections. RESULT CALLED TO, READ BACK BY AND VERIFIED WITH: [name],[name] 0055 11/09/15 MITCHELL,L      Scheduled Meds: . aspirin EC  81 mg Oral Daily  . cefTRIAXone (ROCEPHIN) IVPB 1 gram/50 mL D5W  1 g Intravenous Q24H  . Chlorhexidine Gluconate Cloth  6 each Topical Q0600  . clopidogrel  75 mg Oral Daily  . darbepoetin (ARANESP) injection - NON-DIALYSIS  60 mcg Subcutaneous Q Mon-1800  . dialysis solution 2.5% low-MG/low-CA    Intraperitoneal Q24H  . gentamicin cream  1 application Topical Daily  . heparin  5,000 Units Subcutaneous Q8H  . insulin aspart  0-9 Units Subcutaneous Q4H  . lanthanum  1,000 mg Oral TID WC  . levETIRAcetam  500 mg Intravenous Q24H  . levothyroxine  50 mcg Oral Daily  . multivitamin  1 tablet Oral QHS  . mupirocin ointment  1 application Nasal BID  . pantoprazole  40 mg Oral Daily  . phenytoin (DILANTIN) IV  100 mg Intravenous Q8H     LOS: 2 days  Time spent: 35 minutes   Cherene Altes, MD [facility] Office  [phone] Pager - Text Page per Amion as per below:  On-Call/Text Page:      [email or web address]      password [email or web address]  If 7PM-7AM, please contact night-coverage [email or web address] Password [email or web address] 11/10/2015, 1:53 PM

## 2015-11-10 NOTE — Progress Notes (Addendum)
Interval History:                                                                                                                      60 y.o. female with history of CVA, HTN, CHF, hypothyroidism, DM2, and ESRD on peritoneal hemodialysis daily who presented with seizures. Around 1pm the patient was noted to have her first seizure that lasted 5-6 minutes with generalized tonic-clonic movements. EMS was called and patient was able to return to baseline, at which time she refused transport to the hospital. Around 2:18 PM she had a second seizure with acute left-sided facial drooping for which EMS was again called. Patient was very lethargic and altered after her seizing stoped. In the ambulance the patient had a third seizure. Patient was recently taken off of [place] by her Neurologist (Dr. Melrose Nakayama at [facility]). Dilantin was stopped in 06/2015 after the patient had repeated falls. Her last prior seizure activity was 05/2015.  Patient was started back on Keppra 500 mg BID and Dilantin 100 TID and has had no further seizures. See is alert and awake at this time.    Past Medical History: Past Medical History  Diagnosis Date  . CVA, old, hemiparesis ([place])   . Hypertension   . Diabetes ([place])   . ESRD on peritoneal dialysis ([place])   . CHF (congestive heart failure) ([place])   . Hypothyroidism   . Seizures ([place])   . Stroke [facility])     Past Surgical History  Procedure Laterality Date  . Cholecystectomy    . Amputation toe    . Insertion of dialysis catheter      Family History: History reviewed. No pertinent family history.  Social History:   reports that she has quit smoking. Her smoking use included Cigarettes. She has a 30 pack-year smoking history. She does not have any smokeless tobacco history on file. She reports that she does not drink alcohol or use illicit drugs.  Allergies:  Allergies  Allergen Reactions  . Adhesive [Tape] Hives    Please use paper tape  . Salicylates Hives  and Nausea Only    Uncoated.  Diona Fanti [Aspirin] Other (See Comments)    GI upset for non-enteric coated aspirin     Medications:                                                                                                                         Current facility-administered medications:  .  acetaminophen (TYLENOL) tablet 650 mg, 650 mg, Oral, Q6H PRN **OR** acetaminophen (TYLENOL) suppository 650 mg, 650 mg, Rectal, Q6H PRN, Norval Morton, MD .  albuterol (PROVENTIL) (2.5 MG/3ML) 0.083% nebulizer solution 2.5 mg, 2.5 mg, Nebulization, Q2H PRN, Norval Morton, MD .  aspirin EC tablet 81 mg, 81 mg, Oral, Daily, Cherene Altes, MD, 81 mg at 11/10/15 1100 .  cefTRIAXone (ROCEPHIN) 1 g in dextrose 5 % 50 mL IVPB, 1 g, Intravenous, Q24H, Rebecka Apley, RPH, 1 g at 11/09/15 1841 .  Chlorhexidine Gluconate Cloth 2 % PADS 6 each, 6 each, Topical, Q0600, Norval Morton, MD, 6 each at 11/10/15 0546 .  clopidogrel (PLAVIX) tablet 75 mg, 75 mg, Oral, Daily, Cherene Altes, MD, 75 mg at 11/10/15 1100 .  Darbepoetin Alfa (ARANESP) injection 60 mcg, 60 mcg, Subcutaneous, Q Mon-1800, Elmarie Shiley, MD .  dialysis solution 2.5% low-MG/low-CA dianeal solution, , Intraperitoneal, Q24H, Elmarie Shiley, MD .  ferric gluconate (NULECIT) 250 mg in sodium chloride 0.9 % 100 mL IVPB, 250 mg, Intravenous, Once, Mauricia Area, MD, 250 mg at 11/10/15 1100 .  gentamicin cream (GARAMYCIN) 0.1 % 1 application, 1 application, Topical, Daily, Elmarie Shiley, MD .  heparin 1000 unit/ml injection 2,500 Units, 2,500 Units, Intraperitoneal, PRN, Elmarie Shiley, MD .  heparin injection 5,000 Units, 5,000 Units, Subcutaneous, Q8H, Norval Morton, MD, 5,000 Units at 11/10/15 0546 .  insulin aspart (novoLOG) injection 0-9 Units, 0-9 Units, Subcutaneous, Q4H, Norval Morton, MD, 1 Units at 11/10/15 0817 .  lanthanum (FOSRENOL) chewable tablet 1,000 mg, 1,000 mg, Oral, TID WC, Mauricia Area, MD, 1,000 mg at 11/10/15 0817 .  levETIRAcetam  (KEPPRA) 500 mg in sodium chloride 0.9 % 100 mL IVPB, 500 mg, Intravenous, Q24H, Greta Doom, MD, 500 mg at 11/10/15 1100 .  levothyroxine (SYNTHROID, LEVOTHROID) tablet 50 mcg, 50 mcg, Oral, Daily, Cherene Altes, MD, 50 mcg at 11/10/15 1100 .  LORazepam (ATIVAN) injection 1-2 mg, 1-2 mg, Intravenous, Q2H PRN, Norval Morton, MD .  multivitamin (RENA-VIT) tablet 1 tablet, 1 tablet, Oral, QHS, Mauricia Area, MD .  mupirocin ointment (BACTROBAN) 2 % 1 application, 1 application, Nasal, BID, Norval Morton, MD, 1 application at 16/10/96 1101 .  ondansetron (ZOFRAN) tablet 4 mg, 4 mg, Oral, Q6H PRN **OR** ondansetron (ZOFRAN) injection 4 mg, 4 mg, Intravenous, Q6H PRN, Rondell A Smith, MD .  pantoprazole (PROTONIX) EC tablet 40 mg, 40 mg, Oral, Daily, Cherene Altes, MD, 40 mg at 11/10/15 1100 .  phenytoin (DILANTIN) injection 100 mg, 100 mg, Intravenous, Q8H, Greta Doom, MD, 100 mg at 11/10/15 0546   Neurologic Examination:                                                                                                     Today's Vitals   11/09/15 2324 11/10/15 0317 11/10/15 0400 11/10/15 0758  BP: 123/85  106/59 107/93  Pulse: 74  73 76  Temp: 97.6 F (36.4 C) 97.4 F (36.3 C)  97.5 F (36.4 C)  TempSrc: Axillary Axillary  Oral  Resp: '17  17 20  '$ Height:      Weight:      SpO2: 98%  98% 100%  PainSc:    0-No pain   Neuro: MS: Awake, alert, continues to have some neglect of the left. She is verbalizing an answer  Questions to  Today, pl;ace and year CN: Pupils equal round and reactive to light, she does not cross midline to the left with her eyes. Motor: She has significantly improved strength of the left side  moving it spontaneously and against gravity. Old left UE contraction.  Sensory: Endorses symmetric sensation  Lab Results: Basic Metabolic Panel:  Recent Labs Lab 11/08/15 1539 11/08/15 1559 11/09/15 0255 11/10/15 0227  NA 140 137 141 142   K 5.0 5.3* 5.2* 4.2  CL 109 107 109 110  CO2  --  20* 22 22  GLUCOSE 176* 179* 120* 136*  BUN 32* 30* 33* 30*  CREATININE 5.30* 5.17* 5.54* 5.51*  CALCIUM  --  9.2 8.9 8.8*  PHOS  --   --   --  6.5*    Liver Function Tests:  Recent Labs Lab 11/08/15 1559 11/10/15 0227  AST 32  --   ALT 14  --   ALKPHOS 98  --   BILITOT 0.3  --   PROT 7.1  --   ALBUMIN 2.1* 2.1*   No results for input(s): LIPASE, AMYLASE in the last 168 hours.  Recent Labs Lab 11/09/15 0635  AMMONIA 17    CBC:  Recent Labs Lab 11/08/15 1539 11/08/15 1559 11/09/15 0255 11/10/15 0227  WBC  --  10.6* 8.4 7.3  NEUTROABS  --  9.0*  --   --   HGB 11.2* 10.5* 9.5* 10.2*  HCT 33.0* 35.3* 31.4* 33.8*  MCV  --  86.5 86.3 86.4  PLT  --  386 356 371    Cardiac Enzymes: No results for input(s): CKTOTAL, CKMB, CKMBINDEX, TROPONINI in the last 168 hours.  Lipid Panel: No results for input(s): CHOL, TRIG, HDL, CHOLHDL, VLDL, LDLCALC in the last 168 hours.  CBG:  Recent Labs Lab 11/09/15 1636 11/09/15 2053 11/09/15 2343 11/10/15 0400 11/10/15 0758  GLUCAP 91 87 98 132* 140*    Microbiology: Results for orders placed or performed during the hospital encounter of 11/08/15  MRSA PCR Screening     Status: Abnormal   Collection Time: 11/08/15  9:58 PM  Result Value Ref Range Status   MRSA by PCR POSITIVE (A) NEGATIVE Final    Comment:        The GeneXpert MRSA Assay (FDA approved for NASAL specimens only), is one component of a comprehensive MRSA colonization surveillance program. It is not intended to diagnose MRSA infection nor to guide or monitor treatment for MRSA infections. RESULT CALLED TO, READ BACK BY AND VERIFIED WITH: [name],[name] 0055 11/09/15 MITCHELL,L     Imaging: Ct Head Wo Contrast  11/08/2015  CLINICAL DATA:  Seizure activity EXAM: CT HEAD WITHOUT CONTRAST TECHNIQUE: Contiguous axial images were obtained from the base of the skull through the vertex without intravenous  contrast. COMPARISON:  09/12/2015 FINDINGS: The bony calvarium is intact. Heavy calcification of the carotid arteries is again seen at the skullbase. Mild atrophic changes are seen. Changes of prior infarcts are noted in the distribution of the right middle cerebral artery. Lacunar infarct is again identified on the right in the basal ganglia. No findings to suggest acute hemorrhage, acute infarction or space-occupying mass lesion are noted. IMPRESSION:  Chronic ischemic changes on the right. No acute abnormality is noted. These results were called by telephone at the time of interpretation on 11/08/2015 at 3:55 pm to Dr. Leonel Ramsay, who verbally acknowledged these results. Electronically Signed   By: Inez Catalina M.D.   On: 11/08/2015 15:57   Mr Virgel Paling Wo Contrast  11/08/2015  CLINICAL DATA:  60 year old hypertensive diabetic female on dialysis. History of seizures. Off seizure medication for 3 days. Seizure this morning. Left-sided flaccid. Remote infarct. Subsequent encounter. EXAM: MRI HEAD WITHOUT CONTRAST MRA HEAD WITHOUT CONTRAST TECHNIQUE: Multiplanar, multiecho pulse sequences of the brain and surrounding structures were obtained without intravenous contrast. Angiographic images of the head were obtained using MRA technique without contrast. COMPARISON:  11/08/2015 head CT.  05/24/2015 brain MR. FINDINGS: MRI HEAD FINDINGS Exam is motion degraded. No acute infarct or intracranial hemorrhage. Remote large right hemispheric infarct with encephalomalacia right frontal lobe, parietal lobe and temporal lobe with subsequent dilation right temporal horn. Remote infarct right corona radiata/lenticular nucleus. Remote small infarct left caudate. Remote small infarct left thalamus. Prominent chronic microvascular changes. Global atrophy without hydrocephalus. No intracranial mass lesion noted on this unenhanced exam. Cervical medullary junction unremarkable. MRA HEAD FINDINGS Exam limited by motion degradation  with markedly limited evaluation for grading stenosis and detecting aneurysm. All that can be stated with certainty is that there is flow within portions of the internal carotid arteries bilaterally, vertebral arteries and basilar artery. Findings suspicious for prominent intracranial atherosclerotic changes including significant stenosis left internal carotid artery cavernous segment, significant posterior cerebral artery narrowing greater on the left and decrease number of visualized right middle cerebral artery branch vessels (consistent with patient's remote right hemispheric infarct). IMPRESSION: MRI HEAD Exam is significantly motion degraded. No acute infarct or intracranial hemorrhage. Remote large right hemispheric infarct. Remote infarct right corona radiata/lenticular nucleus. Remote small infarct left caudate. Remote small infarct left thalamus. Prominent chronic microvascular changes. Global atrophy without hydrocephalus. MRA HEAD Exam limited by motion degradation with markedly limited evaluation for grading stenosis and detecting aneurysm. All that can be stated with certainty is that there is flow within portions of the internal carotid arteries bilaterally, vertebral arteries and basilar artery. Findings suspicious for prominent intracranial atherosclerotic changes. Electronically Signed   By: Genia Del M.D.   On: 11/08/2015 18:33   Mr Brain Wo Contrast  11/08/2015  CLINICAL DATA:  60 year old hypertensive diabetic female on dialysis. History of seizures. Off seizure medication for 3 days. Seizure this morning. Left-sided flaccid. Remote infarct. Subsequent encounter. EXAM: MRI HEAD WITHOUT CONTRAST MRA HEAD WITHOUT CONTRAST TECHNIQUE: Multiplanar, multiecho pulse sequences of the brain and surrounding structures were obtained without intravenous contrast. Angiographic images of the head were obtained using MRA technique without contrast. COMPARISON:  11/08/2015 head CT.  05/24/2015 brain MR.  FINDINGS: MRI HEAD FINDINGS Exam is motion degraded. No acute infarct or intracranial hemorrhage. Remote large right hemispheric infarct with encephalomalacia right frontal lobe, parietal lobe and temporal lobe with subsequent dilation right temporal horn. Remote infarct right corona radiata/lenticular nucleus. Remote small infarct left caudate. Remote small infarct left thalamus. Prominent chronic microvascular changes. Global atrophy without hydrocephalus. No intracranial mass lesion noted on this unenhanced exam. Cervical medullary junction unremarkable. MRA HEAD FINDINGS Exam limited by motion degradation with markedly limited evaluation for grading stenosis and detecting aneurysm. All that can be stated with certainty is that there is flow within portions of the internal carotid arteries bilaterally, vertebral arteries and basilar artery. Findings suspicious for prominent intracranial  atherosclerotic changes including significant stenosis left internal carotid artery cavernous segment, significant posterior cerebral artery narrowing greater on the left and decrease number of visualized right middle cerebral artery branch vessels (consistent with patient's remote right hemispheric infarct). IMPRESSION: MRI HEAD Exam is significantly motion degraded. No acute infarct or intracranial hemorrhage. Remote large right hemispheric infarct. Remote infarct right corona radiata/lenticular nucleus. Remote small infarct left caudate. Remote small infarct left thalamus. Prominent chronic microvascular changes. Global atrophy without hydrocephalus. MRA HEAD Exam limited by motion degradation with markedly limited evaluation for grading stenosis and detecting aneurysm. All that can be stated with certainty is that there is flow within portions of the internal carotid arteries bilaterally, vertebral arteries and basilar artery. Findings suspicious for prominent intracranial atherosclerotic changes. Electronically Signed   By:  Genia Del M.D.   On: 11/08/2015 18:33   Dg Chest Port 1 View  11/09/2015  CLINICAL DATA:  cough EXAM: PORTABLE CHEST - 1 VIEW COMPARISON:  CT 09/23/2015 and previous FINDINGS: Persistent right pleural effusion and right lower lung airspace opacity. Left lung clear. Heart size normal.  Atheromatous aorta. No pneumothorax. Visualized bones unremarkable. IMPRESSION: 1. Persistent right pleural effusion and right lower lung consolidation/atelectasis. Electronically Signed   By: Lucrezia Europe M.D.   On: 11/09/2015 09:36    Assessment and plan:   Lucindia DEAISHA WELBORN is an 60 y.o. female patient with status epilepticus following cessation of Keppra 3 days prior. Currently having no seizures and back on Dilantin and Keppra.   Recommend: 1) continue Keppra 500 mg daily 2) Continue Dilantin 100 3 times a day  No further recommendations. Will S/O   Etta Quill PA-C [facility] [phone]  11/10/2015, 11:21 AM

## 2015-11-10 NOTE — Progress Notes (Signed)
Subjective: Interval History: has complaints c0ugh.  Objective: Vital signs in last 24 hours: Temp:  [97.4 F (36.3 C)-98 F (36.7 C)] 97.5 F (36.4 C) (05/29 0758) Pulse Rate:  [53-77] 76 (05/29 0758) Resp:  [11-23] 20 (05/29 0758) BP: (106-150)/(59-98) 107/93 mmHg (05/29 0758) SpO2:  [97 %-100 %] 100 % (05/29 0758) Weight change:   Intake/Output from previous day:   Intake/Output this shift:    General appearance: pale and slowed mentation, chronicly ill Resp: diminished breath sounds bilaterally and rhonchi bibasilar Cardio: S1, S2 normal and systolic murmur: holosystolic 2/6, blowing at apex GI: pos bs, pos FW, PD cath L mid abdm Extremities: extremities normal, atraumatic, no cyanosis or edema  Lab Results:  Recent Labs  11/09/15 0255 11/10/15 0227  WBC 8.4 7.3  HGB 9.5* 10.2*  HCT 31.4* 33.8*  PLT 356 371   BMET:  Recent Labs  11/09/15 0255 11/10/15 0227  NA 141 142  K 5.2* 4.2  CL 109 110  CO2 22 22  GLUCOSE 120* 136*  BUN 33* 30*  CREATININE 5.54* 5.51*  CALCIUM 8.9 8.8*   No results for input(s): PTH in the last 72 hours. Iron Studies:  Recent Labs  11/09/15 1557  IRON 35  TIBC 190*  FERRITIN 661*    Studies/Results: Ct Head Wo Contrast  11/08/2015  CLINICAL DATA:  Seizure activity EXAM: CT HEAD WITHOUT CONTRAST TECHNIQUE: Contiguous axial images were obtained from the base of the skull through the vertex without intravenous contrast. COMPARISON:  09/12/2015 FINDINGS: The bony calvarium is intact. Heavy calcification of the carotid arteries is again seen at the skullbase. Mild atrophic changes are seen. Changes of prior infarcts are noted in the distribution of the right middle cerebral artery. Lacunar infarct is again identified on the right in the basal ganglia. No findings to suggest acute hemorrhage, acute infarction or space-occupying mass lesion are noted. IMPRESSION: Chronic ischemic changes on the right. No acute abnormality is noted.  These results were called by telephone at the time of interpretation on 11/08/2015 at 3:55 pm to Dr. Leonel Ramsay, who verbally acknowledged these results. Electronically Signed   By: Inez Catalina M.D.   On: 11/08/2015 15:57   Mr Virgel Paling Wo Contrast  11/08/2015  CLINICAL DATA:  60 year old hypertensive diabetic female on dialysis. History of seizures. Off seizure medication for 3 days. Seizure this morning. Left-sided flaccid. Remote infarct. Subsequent encounter. EXAM: MRI HEAD WITHOUT CONTRAST MRA HEAD WITHOUT CONTRAST TECHNIQUE: Multiplanar, multiecho pulse sequences of the brain and surrounding structures were obtained without intravenous contrast. Angiographic images of the head were obtained using MRA technique without contrast. COMPARISON:  11/08/2015 head CT.  05/24/2015 brain MR. FINDINGS: MRI HEAD FINDINGS Exam is motion degraded. No acute infarct or intracranial hemorrhage. Remote large right hemispheric infarct with encephalomalacia right frontal lobe, parietal lobe and temporal lobe with subsequent dilation right temporal horn. Remote infarct right corona radiata/lenticular nucleus. Remote small infarct left caudate. Remote small infarct left thalamus. Prominent chronic microvascular changes. Global atrophy without hydrocephalus. No intracranial mass lesion noted on this unenhanced exam. Cervical medullary junction unremarkable. MRA HEAD FINDINGS Exam limited by motion degradation with markedly limited evaluation for grading stenosis and detecting aneurysm. All that can be stated with certainty is that there is flow within portions of the internal carotid arteries bilaterally, vertebral arteries and basilar artery. Findings suspicious for prominent intracranial atherosclerotic changes including significant stenosis left internal carotid artery cavernous segment, significant posterior cerebral artery narrowing greater on the left and decrease  number of visualized right middle cerebral artery branch  vessels (consistent with patient's remote right hemispheric infarct). IMPRESSION: MRI HEAD Exam is significantly motion degraded. No acute infarct or intracranial hemorrhage. Remote large right hemispheric infarct. Remote infarct right corona radiata/lenticular nucleus. Remote small infarct left caudate. Remote small infarct left thalamus. Prominent chronic microvascular changes. Global atrophy without hydrocephalus. MRA HEAD Exam limited by motion degradation with markedly limited evaluation for grading stenosis and detecting aneurysm. All that can be stated with certainty is that there is flow within portions of the internal carotid arteries bilaterally, vertebral arteries and basilar artery. Findings suspicious for prominent intracranial atherosclerotic changes. Electronically Signed   By: Genia Del M.D.   On: 11/08/2015 18:33   Mr Brain Wo Contrast  11/08/2015  CLINICAL DATA:  60 year old hypertensive diabetic female on dialysis. History of seizures. Off seizure medication for 3 days. Seizure this morning. Left-sided flaccid. Remote infarct. Subsequent encounter. EXAM: MRI HEAD WITHOUT CONTRAST MRA HEAD WITHOUT CONTRAST TECHNIQUE: Multiplanar, multiecho pulse sequences of the brain and surrounding structures were obtained without intravenous contrast. Angiographic images of the head were obtained using MRA technique without contrast. COMPARISON:  11/08/2015 head CT.  05/24/2015 brain MR. FINDINGS: MRI HEAD FINDINGS Exam is motion degraded. No acute infarct or intracranial hemorrhage. Remote large right hemispheric infarct with encephalomalacia right frontal lobe, parietal lobe and temporal lobe with subsequent dilation right temporal horn. Remote infarct right corona radiata/lenticular nucleus. Remote small infarct left caudate. Remote small infarct left thalamus. Prominent chronic microvascular changes. Global atrophy without hydrocephalus. No intracranial mass lesion noted on this unenhanced exam.  Cervical medullary junction unremarkable. MRA HEAD FINDINGS Exam limited by motion degradation with markedly limited evaluation for grading stenosis and detecting aneurysm. All that can be stated with certainty is that there is flow within portions of the internal carotid arteries bilaterally, vertebral arteries and basilar artery. Findings suspicious for prominent intracranial atherosclerotic changes including significant stenosis left internal carotid artery cavernous segment, significant posterior cerebral artery narrowing greater on the left and decrease number of visualized right middle cerebral artery branch vessels (consistent with patient's remote right hemispheric infarct). IMPRESSION: MRI HEAD Exam is significantly motion degraded. No acute infarct or intracranial hemorrhage. Remote large right hemispheric infarct. Remote infarct right corona radiata/lenticular nucleus. Remote small infarct left caudate. Remote small infarct left thalamus. Prominent chronic microvascular changes. Global atrophy without hydrocephalus. MRA HEAD Exam limited by motion degradation with markedly limited evaluation for grading stenosis and detecting aneurysm. All that can be stated with certainty is that there is flow within portions of the internal carotid arteries bilaterally, vertebral arteries and basilar artery. Findings suspicious for prominent intracranial atherosclerotic changes. Electronically Signed   By: Genia Del M.D.   On: 11/08/2015 18:33   Dg Chest Port 1 View  11/09/2015  CLINICAL DATA:  cough EXAM: PORTABLE CHEST - 1 VIEW COMPARISON:  CT 09/23/2015 and previous FINDINGS: Persistent right pleural effusion and right lower lung airspace opacity. Left lung clear. Heart size normal.  Atheromatous aorta. No pneumothorax. Visualized bones unremarkable. IMPRESSION: 1. Persistent right pleural effusion and right lower lung consolidation/atelectasis. Electronically Signed   By: Lucrezia Europe M.D.   On: 11/09/2015  09:36    I have reviewed the patient's current medications.  Assessment/Plan: 1 ESRD PD going well. bp coming down, use 1.5%.  2 Anemia needs Fe 3 HPTH needs P binder 4 Sz on DPH per Neuro 5 Debill 6 ? CAP on Rocephin P PD, 1.5%, IV Fe, P binders  LOS: 2 days   Ki Corbo L 11/10/2015,8:04 AM

## 2015-11-11 DIAGNOSIS — R443 Hallucinations, unspecified: Secondary | ICD-10-CM | POA: Diagnosis present

## 2015-11-11 DIAGNOSIS — R1310 Dysphagia, unspecified: Secondary | ICD-10-CM | POA: Diagnosis present

## 2015-11-11 DIAGNOSIS — G8384 Todd's paralysis (postepileptic): Secondary | ICD-10-CM

## 2015-11-11 DIAGNOSIS — N186 End stage renal disease: Secondary | ICD-10-CM | POA: Diagnosis present

## 2015-11-11 LAB — GLUCOSE, CAPILLARY
GLUCOSE-CAPILLARY: 134 mg/dL — AB (ref 65–99)
GLUCOSE-CAPILLARY: 176 mg/dL — AB (ref 65–99)
GLUCOSE-CAPILLARY: 180 mg/dL — AB (ref 65–99)
Glucose-Capillary: 122 mg/dL — ABNORMAL HIGH (ref 65–99)
Glucose-Capillary: 137 mg/dL — ABNORMAL HIGH (ref 65–99)
Glucose-Capillary: 140 mg/dL — ABNORMAL HIGH (ref 65–99)
Glucose-Capillary: 176 mg/dL — ABNORMAL HIGH (ref 65–99)

## 2015-11-11 LAB — RENAL FUNCTION PANEL
Albumin: 2.1 g/dL — ABNORMAL LOW (ref 3.5–5.0)
Anion gap: 14 (ref 5–15)
BUN: 22 mg/dL — AB (ref 6–20)
CO2: 19 mmol/L — ABNORMAL LOW (ref 22–32)
CREATININE: 4.82 mg/dL — AB (ref 0.44–1.00)
Calcium: 8.5 mg/dL — ABNORMAL LOW (ref 8.9–10.3)
Chloride: 106 mmol/L (ref 101–111)
GFR calc Af Amer: 10 mL/min — ABNORMAL LOW (ref >60)
GFR, EST NON AFRICAN AMERICAN: 9 mL/min — AB (ref >60)
GLUCOSE: 136 mg/dL — AB (ref 65–99)
PHOSPHORUS: 6.1 mg/dL — AB (ref 2.5–4.6)
POTASSIUM: 4.2 mmol/L (ref 3.5–5.1)
Sodium: 139 mmol/L (ref 135–145)

## 2015-11-11 LAB — CBC
HEMATOCRIT: 36.5 % (ref 36.0–46.0)
Hemoglobin: 11.1 g/dL — ABNORMAL LOW (ref 12.0–15.0)
MCH: 26.3 pg (ref 26.0–34.0)
MCHC: 30.4 g/dL (ref 30.0–36.0)
MCV: 86.5 fL (ref 78.0–100.0)
Platelets: 342 10*3/uL (ref 150–400)
RBC: 4.22 MIL/uL (ref 3.87–5.11)
RDW: 21.1 % — AB (ref 11.5–15.5)
WBC: 7.9 10*3/uL (ref 4.0–10.5)

## 2015-11-11 MED ORDER — RENA-VITE PO TABS
1.0000 | ORAL_TABLET | Freq: Every day | ORAL | Status: DC
  Administered 2015-11-11 – 2015-11-20 (×9): 1 via ORAL
  Filled 2015-11-11 (×9): qty 1

## 2015-11-11 MED ORDER — LANTHANUM CARBONATE 500 MG PO CHEW: 1000.0000 mg | CHEWABLE_TABLET | Freq: Three times a day (TID) | ORAL | Status: DC

## 2015-11-11 MED ORDER — INSULIN ASPART 100 UNIT/ML ~~LOC~~ SOLN
0.0000 [IU] | SUBCUTANEOUS | Status: DC
  Administered 2015-11-12 – 2015-11-13 (×5): 3 [IU] via SUBCUTANEOUS
  Administered 2015-11-13: 8 [IU] via SUBCUTANEOUS
  Administered 2015-11-13: 5 [IU] via SUBCUTANEOUS
  Administered 2015-11-14: 2 [IU] via SUBCUTANEOUS
  Administered 2015-11-14: 5 [IU] via SUBCUTANEOUS
  Administered 2015-11-14: 3 [IU] via SUBCUTANEOUS
  Administered 2015-11-14: 2 [IU] via SUBCUTANEOUS
  Administered 2015-11-14: 5 [IU] via SUBCUTANEOUS
  Administered 2015-11-15 (×2): 3 [IU] via SUBCUTANEOUS
  Administered 2015-11-16: 2 [IU] via SUBCUTANEOUS
  Administered 2015-11-16: 3 [IU] via SUBCUTANEOUS
  Administered 2015-11-16: 2 [IU] via SUBCUTANEOUS
  Administered 2015-11-17: 5 [IU] via SUBCUTANEOUS
  Administered 2015-11-17: 2 [IU] via SUBCUTANEOUS
  Administered 2015-11-17: 3 [IU] via SUBCUTANEOUS

## 2015-11-11 NOTE — Progress Notes (Signed)
CSW attempted to call April Woods pt CSW at [facility] [phone]) to discuss pt case- unable to leave message will attempt later  Domenica Reamer, [place] Worker [phone]

## 2015-11-11 NOTE — Progress Notes (Signed)
Rehab Admissions Coordinator Note:  Patient was screened by Cleatrice Burke for appropriateness for an Inpatient Acute Rehab Consult per PT recommendation. I am familiar with this pt from my previous referral at [facility] 05/2015. Pt is cared for by her sister since 2010. [facility] and is is [facility] by [facility]. 05/2015 was discharged home with sister and [facility] was providing therapy services in their facility .  At this time, we are recommending to follow pt's progress with therapy. [facility] also typically prefers SNF in [place] with coordinating care at the [facility] facility. Unlikely to approve an inpt hospital rehab admission.  Please call me with any questions.  Cleatrice Burke 11/11/2015, 4:00 PM  I can be reached at [phone].

## 2015-11-11 NOTE — Evaluation (Signed)
Clinical/Bedside Swallow Evaluation Patient Details  Name: Sherri Davidson MRN: [id number] Date of Birth: January 23, 1956  Today's Date: 11/11/2015 Time: SLP Start Time (ACUTE ONLY): 1004 SLP Stop Time (ACUTE ONLY): 1019 SLP Time Calculation (min) (ACUTE ONLY): 15 min  Past Medical History:  Past Medical History  Diagnosis Date  . CVA, old, hemiparesis ([place])   . Hypertension   . Diabetes ([place])   . ESRD on peritoneal dialysis ([place])   . CHF (congestive heart failure) ([place])   . Hypothyroidism   . Seizures ([place])   . Stroke [facility])    Past Surgical History:  Past Surgical History  Procedure Laterality Date  . Cholecystectomy    . Amputation toe    . Insertion of dialysis catheter     HPI:  60 y.o. female with history of CVA, HTN, CHF, hypothyroidism, DM2, and ESRD on peritoneal hemodialysis daily who presented with seizures.  Dx include seizures/Todd's paralysis/acute encephalopathy; dysphagia (coughing with clear liquids); possible UTI.  CXR Persistent right pleural effusion and right lower lung consolidation/atelectasis.   Assessment / Plan / Recommendation Clinical Impression  Pt presents with a likely acute reversible dysphagia related primarily to mental status.  Intermittently attentive, attempting to drink from her mitten restraint.  Pereverative chewing with or without POs.  Able to consume purees and nectar-thick liquids with prolonged oral effort but no s/s of aspiration.  Recommend initiating dysphagia 1, nectar thick liquids; meds whole in puree.  Full assist with feeding.  SLP will follow for safety and diet advancement.     Aspiration Risk  Moderate aspiration risk    Diet Recommendation   dysphagia 1, nectar-thick liquid  Medication Administration: Whole meds with puree    Other  Recommendations Oral Care Recommendations: Oral care BID Other Recommendations: Order thickener from pharmacy   Follow up Recommendations   (tba)    Frequency and Duration min 2x/week  2  weeks       Prognosis Prognosis for Safe Diet Advancement: Good      Swallow Study   General Date of Onset: 11/08/15 HPI: 60 y.o. female with history of CVA, HTN, CHF, hypothyroidism, DM2, and ESRD on peritoneal hemodialysis daily who presented with seizures.  Dx include seizures/Todd's paralysis/acute encephalopathy; dysphagia (coughing with clear liquids); possible UTI.  CXR Persistent right pleural effusion and right lower lung consolidation/atelectasis. Type of Study: Bedside Swallow Evaluation Previous Swallow Assessment: no Diet Prior to this Study: NPO Temperature Spikes Noted: No Respiratory Status: Room air History of Recent Intubation: No Behavior/Cognition: Alert;Confused Oral Cavity Assessment: Within Functional Limits Oral Care Completed by SLP: No Oral Cavity - Dentition: Edentulous Vision: Functional for self-feeding Self-Feeding Abilities: Total assist Patient Positioning: Upright in bed Baseline Vocal Quality: Normal Volitional Cough: Strong Volitional Swallow: Unable to elicit    Oral/Motor/Sensory Function Overall Oral Motor/Sensory Function: Within functional limits   Ice Chips Ice chips: Not tested   Thin Liquid Thin Liquid: Impaired Presentation: Cup Pharyngeal  Phase Impairments: Multiple swallows;Cough - Immediate    Nectar Thick Nectar Thick Liquid: Impaired Presentation: Cup Oral Phase Impairments: Impaired mastication Oral phase functional implications: Prolonged oral transit Pharyngeal Phase Impairments: Suspected delayed Swallow   Honey Thick Honey Thick Liquid: Not tested   Puree Puree: Impaired Presentation: Spoon Oral Phase Functional Implications: Prolonged oral transit Pharyngeal Phase Impairments: Multiple swallows   Solid   GO   Solid: Not tested        Juan Quam Laurice 11/11/2015,10:28 AM

## 2015-11-11 NOTE — Progress Notes (Signed)
Subjective: Interval History: has no complaint .  Objective: Vital signs in last 24 hours: Temp:  [97.5 F (36.4 C)-98.3 F (36.8 C)] 97.6 F (36.4 C) (05/30 0328) Pulse Rate:  [76-87] 87 (05/30 0600) Resp:  [14-21] 20 (05/30 0600) BP: (90-146)/(35-93) 115/77 mmHg (05/30 0600) SpO2:  [96 %-100 %] 100 % (05/30 0600) Weight:  [58.1 kg (128 lb 1.4 oz)-59.3 kg (130 lb 11.7 oz)] 58.1 kg (128 lb 1.4 oz) (05/30 0457) Weight change:   Intake/Output from previous day: 05/29 0701 - 05/30 0700 In: 150 [IV Piggyback:150] Out: 1326  Intake/Output this shift:    General appearance: cachectic, pale, slowed mentation and Ox1 Resp: diminished breath sounds bilaterally Cardio: S1, S2 normal and systolic murmur: systolic ejection 2/6, decrescendo at 2nd left intercostal space GI: pos FW, pos bs, nontender , PD cath L mid abdm Extremities: no edema,  Lab Results:  Recent Labs  11/10/15 0227 11/11/15 0447  WBC 7.3 7.9  HGB 10.2* 11.1*  HCT 33.8* 36.5  PLT 371 342   BMET:  Recent Labs  11/10/15 0227 11/11/15 0447  NA 142 139  K 4.2 4.2  CL 110 106  CO2 22 19*  GLUCOSE 136* 136*  BUN 30* 22*  CREATININE 5.51* 4.82*  CALCIUM 8.8* 8.5*   No results for input(s): PTH in the last 72 hours. Iron Studies:  Recent Labs  11/09/15 1557  IRON 35  TIBC 190*  FERRITIN 661*    Studies/Results: No results found.  I have reviewed the patient's current medications.  Assessment/Plan: 1 ESRD PD going well.. Low bicarb ??.  Vol ok. BP with in acceptable range 2 Anemia on ESA,Fe 3 HPTH no value 4 AMS still primary issue  ? Toxic with Sz meds 5 Sz controlled 6 Pneu ?? Mild asp 7 Malnutrition P PD with 1.5%, Sz meds.      LOS: 3 days   Zyrus Hetland L 11/11/2015,7:50 AM

## 2015-11-11 NOTE — Progress Notes (Signed)
When disconnecting patient from cycler this afternoon this RN obtained a bed weight. All extra items were removed from bed and last fill was deducted from weight. The weight was approx 6kg less than previous weights. Dr. Jimmy Footman notified of this and informed him that most recent weight is ACCURATE, whereas previous weights may not have been accurate. Patient is unable to do a standing weight. Will continue to monitor.  Joellen Jersey, RN.

## 2015-11-11 NOTE — Clinical Documentation Improvement (Signed)
Internal Medicine  Can the diagnosis of Malnutrition be further specified? Please document acuity of Malnutrition in next progress note if in agreement. Thank you!   Document Severity - Severe(third degree), Moderate (second degree), Mild (first degree)  Other condition  Unable to clinically determine  Document any associated diagnoses/conditions  Supporting Information: :   Patient is 5'6" tall, weighing 120 pounds with BMI of 19.5  Described as cachectic  No supplementation offered or seen by Nutrition, muscle, fat wasting?  Please exercise your independent, professional judgment when responding. A specific answer is not anticipated or expected.  Thank You, Zoila Shutter RN, BSN, [place] [phone]; Cell: [phone]

## 2015-11-11 NOTE — Progress Notes (Signed)
Physical Therapy Treatment Patient Details Name: Sherri Davidson MRN: [id number] DOB: December 18, 1955 Today's Date: 11/11/2015    History of Present Illness Pt is a 60 year old Caucasian woman with past medical history significant for end-stage renal disease on peritoneal dialysis (followed by [facility] in [place]) from what appears to be underlying hypertension and diabetes. She also has CVA, hypothyroidism, history of congestive heart failure and seizure disorder. She was brought in after witnessed generalized tonic-clonic seizures and associated right-sided facial droop. She was recently taken off of Keppra possibly because of repeated dizziness and falls.    PT Comments    Sister present during treatment session and reports that pt is far from her baseline.  PTA pt typically only needing min guard when using trapeze bar for bed mobility and min assist to steady w/ sit>stand.  Updated follow up recommendation to CIR as pt has strong support at home from sister and sister hopeful that pt can return to min A level w/ transfers prior to returning home. Pt currently requires +2 mod assist for sit<>stand from bed and is quick to fatigue.     Follow Up Recommendations  CIR;Supervision/Assistance - 24 hour     Equipment Recommendations  Other (comment) (TBD at next venue of care)    Recommendations for Other Services Rehab consult;OT consult     Precautions / Restrictions Precautions Precautions: Fall;Other (comment) Precaution Comments: seizure Restrictions Weight Bearing Restrictions: No    Mobility  Bed Mobility Overal bed mobility: Needs Assistance Bed Mobility: Supine to Sit;Sit to Supine     Supine to sit: Mod assist;HOB elevated Sit to supine: Mod assist   General bed mobility comments: Multimodal cues to initiate supine>sit, pt reaching out for therapist hand to pull up to sitting, requiring additional assist to elevate trunk.  Bed pad used for  positioning.  Transfers Overall transfer level: Needs assistance Equipment used: 2 person hand held assist Transfers: Sit to/from Stand Sit to Stand: Mod assist;+2 physical assistance;+2 safety/equipment         General transfer comment: Sit<>stand from bed x2.  Posterior bias and pt bracing legs on side of bed.  Mod assist to steady due to posterior lean and pt fatigues quickly and sits after ~10 seconds on each attempt.  Ambulation/Gait             General Gait Details: not safe to attempt at this time   Stairs            Wheelchair Mobility    Modified Rankin (Stroke Patients Only)       Balance Overall balance assessment: Needs assistance Sitting-balance support: Bilateral upper extremity supported;Feet supported Sitting balance-Leahy Scale: Poor Sitting balance - Comments: Close min guard>mod assist due to Lt lateral and posterior lean.  Pt corrects posture inconsistently w/ verbal cues to do so. Postural control: Posterior lean;Left lateral lean Standing balance support: Bilateral upper extremity supported;During functional activity Standing balance-Leahy Scale: Zero Standing balance comment: requires +2 mod assist due to posterior bias                    Cognition Arousal/Alertness: Awake/alert Behavior During Therapy: Flat affect Overall Cognitive Status: Impaired/Different from baseline Area of Impairment: Orientation;Attention;Memory;Following commands;Safety/judgement;Awareness;Problem solving Orientation Level: Disoriented to;Time;Situation;Place Current Attention Level: Focused Memory: Decreased short-term memory Following Commands: Follows one step commands inconsistently Safety/Judgement: Decreased awareness of safety;Decreased awareness of deficits Awareness: Intellectual Problem Solving: Slow processing;Decreased initiation;Requires verbal cues;Difficulty sequencing General Comments: After her juice is gone pt continues  w/ drinking  motion as if her mittens are full of liquid.    Exercises      General Comments General comments (skin integrity, edema, etc.): Sister present during treatment session and reports that pt is far from her baseline.  PTA pt typically only needing min guard when using trapeze bar for bed mobility and min assist to steady w/ sit>stand.  Updated follow up recommendation to CIR as pt has strong support at home from sister and sister hopeful that pt can return to min A level w/ transfers prior to returning home.       Pertinent Vitals/Pain Pain Assessment: Faces Faces Pain Scale: No hurt Pain Intervention(s): Limited activity within patient's tolerance;Monitored during session    Home Living                      Prior Function            PT Goals (current goals can now be found in the care plan section) Acute Rehab PT Goals Patient Stated Goal: none stated PT Goal Formulation: With family Time For Goal Achievement: 11/24/15 Potential to Achieve Goals: Good Progress towards PT goals: Progressing toward goals (very modestly)    Frequency  Min 3X/week    PT Plan Discharge plan needs to be updated    Co-evaluation             End of Session Equipment Utilized During Treatment: Gait belt Activity Tolerance: Patient limited by fatigue Patient left: in bed;with call bell/phone within reach;with bed alarm set;with family/visitor present     Time: 4503-8882 PT Time Calculation (min) (ACUTE ONLY): 30 min  Charges:  $Therapeutic Activity: 23-37 mins                    G Codes:      Collie Siad PT, DPT  Pager: [phone] Phone: [phone] 11/11/2015, 2:44 PM

## 2015-11-11 NOTE — Progress Notes (Signed)
PROGRESS NOTE    Sherri Davidson  NOB:[id number] DOB: [birth year] DOA: 11/08/2015 PCP: [facility]   Brief Narrative:  60 y.o. WF PMHx CVA, HTN, CHF, hypothyroidism, DM2, and ESRD on peritoneal hemodialysis daily who presented with seizures. Around 1pm the patient was noted to have her first seizure that lasted 5-6 minutes with generalized tonic-clonic movements. EMS was called and patient was able to return to baseline, at which time she refused transport to the hospital. Around 2:18 PM she had a second seizure with acute left-sided facial drooping for which EMS was again called. Patient was very lethargic and altered after her seizing stoped. In the ambulance the patient had a third seizure. Patient was recently taken off of Sherri Davidson by her Neurologist (Dr. Melrose Davidson at [facility]). Dilantin was stopped in 06/2015 after the patient had repeated falls. Her last prior seizure activity was 05/2015.  In the ED CT and MRI of the brain showed previous right sided CVA, but no acute abnormalities. UA was positive. UDS negative. Patient was evaluated by Neurology who recommended continuation of Keppra 500 mg daily and Dilantin 100 mg 3 times a day.    Assessment & Plan:   Principal Problem:   Seizures ([place]) Active Problems:   Pressure ulcer   UTI (lower urinary tract infection)   Acute encephalopathy   Hyperkalemia   Todd's paralysis ([name])   ESRD on peritoneal dialysis ([name])   Hallucinations   Dysphagia    Seizures / Todd's paralysis / acute encephalopathy -Awake but confused, positive hallucinations (seen people in the room)  -Per her sister/caregiver/POA Ms.Sherri Davidson patient usually is A/O 4 at home. Also states patient's primary neurologist had taken patient off of Dilantin secondary to increased lethargy. -Will obtain Dilantin level with a.m. labs, for now continue Dilantin 100 mg TID per neurology recommendation -Continue Keppra 500 mg daily  CVA -CT  and MRI showed multiple old infarcts. Sister aware  Hallucinations -Will continue all medications per neurology but would not add any further psychogenic medication   Dysphagia -5/30 Passed swallow evaluation; dysphagia 1 nectar thick liquid    Possible Urinary tract infection -Urine is abnormal, but unclear how often pt urinates/if she makes regular urine  -No urine culture pending -Completed 3 days of empiric antibiotics   DM type 2 -Hemoglobin A1c pending -Lipid panel pending -Moderate SSI  Hyperkalemia -Resolved w/ resumption of PD  End-stage renal disease on peritoneal dialysis -PD per Nephrology   Pressure ulcer v/s MASD See WOC note for wound info and tx recs   MRSA screen +   DVT prophylaxis: Subcutaneous heparin Code Status: Full Family Communication: sister/caregiver/POA Ms.Sherri Davidson  Disposition Plan: SNF   Consultants:  Nephrology  Neurology    Procedures/Significant Events:  5/27 CT head WO contrast;-Changes of prior infarcts are noted in the distribution of the right middle cerebral artery. -Lacunar infarct again identified on the right in the basal ganglia. No acute hemorrhage, acute infarction or space-occupying mass lesion.  5/27 MRA head;-Findings suspicious for prominent intracranial atherosclerotic changes including significant stenosis left internal carotid artery cavernous segment, significant posterior cerebral artery narrowing greater on the left and decrease number of visualized right middle cerebral artery branch vessels C/W remote right hemispheric infarct. 5/27 MRI brain;-Remote large right hemispheric infarct with encephalomalacia right frontal lobe, parietal lobe and temporal lobe with subsequent dilation right temporal horn. -Remote infarct right corona radiata/lenticular nucleus. Remote small infarct left caudate.Remote small infarct left thalamus.    Cultures 5/27 MRSA  by PCR positive   Antimicrobials: Ceftriaxone 5/27 >  5/30   Devices  LINES / TUBES:      Continuous Infusions:    Subjective: 5/30 A/O 1 (does not aware, why, when). Patient hallucinating and seeing a lady in the room (not there) however on able to tell me her name.   Objective: Filed Vitals:   11/11/15 0800 11/11/15 1305 11/11/15 1621 11/11/15 1939  BP: 96/75 111/73 101/77 114/78  Pulse: 85 88 86 94  Temp: 98.3 F (36.8 C) 98.2 F (36.8 C) 98.3 F (36.8 C) 98.1 F (36.7 C)  TempSrc: Oral Oral Oral Oral  Resp: '16 18 12 21  '$ Height:      Weight:  52.205 kg (115 lb 1.5 oz)    SpO2: 100% 98% 98%     Intake/Output Summary (Last 24 hours) at 11/11/15 2121 Last data filed at 11/11/15 1331  Gross per 24 hour  Intake  11990 ml  Output  13910 ml  Net  -1920 ml   Filed Weights   11/10/15 1558 11/11/15 0457 11/11/15 1305  Weight: 59.3 kg (130 lb 11.7 oz) 58.1 kg (128 lb 1.4 oz) 52.205 kg (115 lb 1.5 oz)    Examination: General: A/O 1(does not know where, when, why), follows some commands, cachectic No acute respiratory distress Eyes: negative scleral hemorrhage, negative anisocoria, negative icterus ENT: Negative Runny nose, negative gingival bleeding, Neck:  Negative scars, masses, torticollis, lymphadenopathy, JVD Lungs: Clear to auscultation bilaterally without wheezes or crackles Cardiovascular: Regular rate and rhythm without murmur gallop or rub normal S1 and S2 Abdomen: negative abdominal pain, nondistended, positive soft, bowel sounds, no rebound, no ascites, no appreciable mass Extremities: No significant cyanosis, clubbing, or edema bilateral lower extremities Skin: Negative rashes, lesions, ulcers Psychiatric:  Positive hallucinations (seen people in the room were not there)  Central nervous system:  Moves all extremities, follows some commands,  negative dysarthria, negative expressive aphasia, negative receptive aphasia.  .     Data Reviewed: Care during the described time interval was provided by me .   I have reviewed this patient's available data, including medical history, events of note, physical examination, and all test results as part of my evaluation. I have personally reviewed and interpreted all radiology studies.  CBC:  Recent Labs Lab 11/08/15 1539 11/08/15 1559 11/09/15 0255 11/10/15 0227 11/11/15 0447  WBC  --  10.6* 8.4 7.3 7.9  NEUTROABS  --  9.0*  --   --   --   HGB 11.2* 10.5* 9.5* 10.2* 11.1*  HCT 33.0* 35.3* 31.4* 33.8* 36.5  MCV  --  86.5 86.3 86.4 86.5  PLT  --  386 356 371 947   Basic Metabolic Panel:  Recent Labs Lab 11/08/15 1539 11/08/15 1559 11/09/15 0255 11/10/15 0227 11/11/15 0447  NA 140 137 141 142 139  K 5.0 5.3* 5.2* 4.2 4.2  CL 109 107 109 110 106  CO2  --  20* 22 22 19*  GLUCOSE 176* 179* 120* 136* 136*  BUN 32* 30* 33* 30* 22*  CREATININE 5.30* 5.17* 5.54* 5.51* 4.82*  CALCIUM  --  9.2 8.9 8.8* 8.5*  PHOS  --   --   --  6.5* 6.1*   GFR: Estimated Creatinine Clearance: 10.4 mL/min (by C-G formula based on Cr of 4.82). Liver Function Tests:  Recent Labs Lab 11/08/15 1559 11/10/15 0227 11/11/15 0447  AST 32  --   --   ALT 14  --   --  ALKPHOS 98  --   --   BILITOT 0.3  --   --   PROT 7.1  --   --   ALBUMIN 2.1* 2.1* 2.1*   No results for input(s): LIPASE, AMYLASE in the last 168 hours.  Recent Labs Lab 11/09/15 0635  AMMONIA 17   Coagulation Profile:  Recent Labs Lab 11/08/15 1559  INR 1.06   Cardiac Enzymes: No results for input(s): CKTOTAL, CKMB, CKMBINDEX, TROPONINI in the last 168 hours. BNP (last 3 results) No results for input(s): PROBNP in the last 8760 hours. HbA1C: No results for input(s): HGBA1C in the last 72 hours. CBG:  Recent Labs Lab 11/11/15 0029 11/11/15 0329 11/11/15 0846 11/11/15 1304 11/11/15 1619  GLUCAP 122* 134* 176* 176* 180*   Lipid Profile: No results for input(s): CHOL, HDL, LDLCALC, TRIG, CHOLHDL, LDLDIRECT in the last 72 hours. Thyroid Function Tests: No results for  input(s): TSH, T4TOTAL, FREET4, T3FREE, THYROIDAB in the last 72 hours. Anemia Panel:  Recent Labs  11/09/15 1557  FERRITIN 661*  TIBC 190*  IRON 35   Urine analysis:    Component Value Date/Time   COLORURINE YELLOW 11/08/2015 1707   COLORURINE Yellow 05/30/2014 1618   APPEARANCEUR TURBID* 11/08/2015 1707   APPEARANCEUR Clear 05/30/2014 1618   LABSPEC 1.018 11/08/2015 1707   LABSPEC 1.009 05/30/2014 1618   PHURINE 7.5 11/08/2015 1707   PHURINE 7.0 05/30/2014 1618   GLUCOSEU 100* 11/08/2015 1707   GLUCOSEU >=500 05/30/2014 1618   HGBUR LARGE* 11/08/2015 1707   HGBUR Negative 05/30/2014 1618   BILIRUBINUR NEGATIVE 11/08/2015 1707   BILIRUBINUR Negative 05/30/2014 1618   KETONESUR NEGATIVE 11/08/2015 1707   KETONESUR Negative 05/30/2014 1618   PROTEINUR >300* 11/08/2015 1707   PROTEINUR 100 mg/dL 05/30/2014 1618   NITRITE NEGATIVE 11/08/2015 1707   NITRITE Negative 05/30/2014 1618   LEUKOCYTESUR LARGE* 11/08/2015 1707   LEUKOCYTESUR Negative 05/30/2014 1618   Sepsis Labs: '@LABRCNTIP'$ (procalcitonin:4,lacticidven:4)  ) Recent Results (from the past 240 hour(s))  MRSA PCR Screening     Status: Abnormal   Collection Time: 11/08/15  9:58 PM  Result Value Ref Range Status   MRSA by PCR POSITIVE (A) NEGATIVE Final    Comment:        The GeneXpert MRSA Assay (FDA approved for NASAL specimens only), is one component of a comprehensive MRSA colonization surveillance program. It is not intended to diagnose MRSA infection nor to guide or monitor treatment for MRSA infections. RESULT CALLED TO, READ BACK BY AND VERIFIED WITH: [name],[name] [phone] 11/09/15 [place]          Radiology Studies: No results found.      Scheduled Meds: . Chlorhexidine Gluconate Cloth  6 each Topical Q0600  . darbepoetin (ARANESP) injection - NON-DIALYSIS  60 mcg Subcutaneous Q Mon-1800  . dialysis solution 2.5% low-MG/low-CA   Intraperitoneal Q24H  . gentamicin cream  1 application  Topical Daily  . heparin  5,000 Units Subcutaneous Q8H  . insulin aspart  0-15 Units Subcutaneous Q4H  . levETIRAcetam  500 mg Intravenous Q24H  . levothyroxine  25 mcg Intravenous QAC breakfast  . multivitamin  1 tablet Oral QHS  . mupirocin ointment  1 application Nasal BID  . phenytoin (DILANTIN) IV  100 mg Intravenous Q8H   Continuous Infusions:    LOS: 3 days    Time spent: 40 minutes    Charod Slawinski, Geraldo Docker, MD [facility] Pager [phone]   If 7PM-7AM, please contact night-coverage [email or web address] Password [facility] 11/11/2015, 9:21  PM

## 2015-11-12 LAB — CBC
HEMATOCRIT: 37.1 % (ref 36.0–46.0)
Hemoglobin: 11.1 g/dL — ABNORMAL LOW (ref 12.0–15.0)
MCH: 25.7 pg — ABNORMAL LOW (ref 26.0–34.0)
MCHC: 29.9 g/dL — AB (ref 30.0–36.0)
MCV: 85.9 fL (ref 78.0–100.0)
Platelets: 344 10*3/uL (ref 150–400)
RBC: 4.32 MIL/uL (ref 3.87–5.11)
RDW: 20.9 % — AB (ref 11.5–15.5)
WBC: 7.8 10*3/uL (ref 4.0–10.5)

## 2015-11-12 LAB — GLUCOSE, CAPILLARY
GLUCOSE-CAPILLARY: 84 mg/dL (ref 65–99)
GLUCOSE-CAPILLARY: 168 mg/dL — AB (ref 65–99)
Glucose-Capillary: 105 mg/dL — ABNORMAL HIGH (ref 65–99)
Glucose-Capillary: 162 mg/dL — ABNORMAL HIGH (ref 65–99)
Glucose-Capillary: 187 mg/dL — ABNORMAL HIGH (ref 65–99)
Glucose-Capillary: 88 mg/dL (ref 65–99)

## 2015-11-12 LAB — PHENYTOIN LEVEL, TOTAL: Phenytoin Lvl: 11.7 ug/mL (ref 10.0–20.0)

## 2015-11-12 LAB — LIPID PANEL
Cholesterol: 119 mg/dL (ref 0–200)
HDL: 60 mg/dL (ref >40)
LDL CALC: 42 mg/dL (ref 0–99)
TRIGLYCERIDES: 85 mg/dL (ref <150)
Total CHOL/HDL Ratio: 2 RATIO
VLDL: 17 mg/dL (ref 0–40)

## 2015-11-12 LAB — MAGNESIUM: Magnesium: 2 mg/dL (ref 1.7–2.4)

## 2015-11-12 LAB — BASIC METABOLIC PANEL
Anion gap: 12 (ref 5–15)
BUN: 18 mg/dL (ref 6–20)
CHLORIDE: 105 mmol/L (ref 101–111)
CO2: 24 mmol/L (ref 22–32)
CREATININE: 4.89 mg/dL — AB (ref 0.44–1.00)
Calcium: 8.6 mg/dL — ABNORMAL LOW (ref 8.9–10.3)
GFR calc Af Amer: 10 mL/min — ABNORMAL LOW (ref >60)
GFR calc non Af Amer: 9 mL/min — ABNORMAL LOW (ref >60)
GLUCOSE: 147 mg/dL — AB (ref 65–99)
Potassium: 2.8 mmol/L — ABNORMAL LOW (ref 3.5–5.1)
Sodium: 141 mmol/L (ref 135–145)

## 2015-11-12 MED ORDER — PHENYTOIN SODIUM 50 MG/ML IJ SOLN
100.0000 mg | Freq: Two times a day (BID) | INTRAMUSCULAR | Status: DC
  Administered 2015-11-12 – 2015-11-14 (×4): 100 mg via INTRAVENOUS
  Filled 2015-11-12 (×5): qty 2

## 2015-11-12 MED ORDER — RESOURCE THICKENUP CLEAR PO POWD
ORAL | Status: DC | PRN
  Administered 2015-11-19: 12:00:00 via ORAL
  Filled 2015-11-12 (×3): qty 125

## 2015-11-12 MED ORDER — ACETAMINOPHEN 325 MG PO TABS
650.0000 mg | ORAL_TABLET | Freq: Four times a day (QID) | ORAL | Status: DC | PRN
  Administered 2015-11-18 – 2015-11-19 (×2): 650 mg via ORAL
  Filled 2015-11-12 (×2): qty 2

## 2015-11-12 MED ORDER — LEVOTHYROXINE SODIUM 50 MCG PO TABS
50.0000 ug | ORAL_TABLET | Freq: Every day | ORAL | Status: DC
  Administered 2015-11-13 – 2015-11-21 (×9): 50 ug via ORAL
  Filled 2015-11-12 (×9): qty 1

## 2015-11-12 MED ORDER — POTASSIUM CHLORIDE 10 MEQ/100ML IV SOLN: 10.0000 meq | INTRAVENOUS | Status: DC

## 2015-11-12 MED ORDER — POTASSIUM CHLORIDE CRYS ER 20 MEQ PO TBCR
40.0000 meq | EXTENDED_RELEASE_TABLET | Freq: Two times a day (BID) | ORAL | Status: AC
  Administered 2015-11-12 (×2): 40 meq via ORAL
  Filled 2015-11-12 (×2): qty 2

## 2015-11-12 NOTE — Progress Notes (Addendum)
Update: April stated patient will be going to respite care soon while her sister is on vacation and will be going to dialysis at [facility], therefore she may be able to coordinate with [facility] to start taking her sooner. [facility] will transport patient to dialysis. [facility] is looking at referral now.   CSW spoke with April Woods at [facility] [phone]) to discuss case. She stated she would check with two facilities and call me back.    Percell Locus [place] LCSWA [phone]

## 2015-11-12 NOTE — Progress Notes (Signed)
Subjective: Interval History: has no complaint  At home, in bedroom, Friday.  Objective: Vital signs in last 24 hours: Temp:  [98.1 F (36.7 C)-98.3 F (36.8 C)] 98.3 F (36.8 C) (05/31 0407) Pulse Rate:  [86-94] 91 (05/31 0407) Resp:  [12-21] 17 (05/31 0407) BP: (101-130)/(73-92) 130/84 mmHg (05/31 0407) SpO2:  [97 %-100 %] 100 % (05/31 0407) Weight:  [52.205 kg (115 lb 1.5 oz)-57 kg (125 lb 10.6 oz)] 57 kg (125 lb 10.6 oz) (05/31 0444) Weight change: -7.095 kg (-15 lb 10.3 oz)  Intake/Output from previous day: 05/30 0701 - 05/31 0700 In: 11990  Out: 13910  Intake/Output this shift:    General appearance: cooperative, cachectic, pale and confused Resp: diminished breath sounds bilaterally Cardio: S1, S2 normal and systolic murmur: systolic ejection 2/6, decrescendo at 2nd left intercostal space GI: pos bs, pos FW, PD cath L mid abdm Extremities: cachectic  Lab Results:  Recent Labs  11/11/15 0447 11/12/15 0316  WBC 7.9 7.8  HGB 11.1* 11.1*  HCT 36.5 37.1  PLT 342 344   BMET:  Recent Labs  11/11/15 0447 11/12/15 0316  NA 139 141  K 4.2 2.8*  CL 106 105  CO2 19* 24  GLUCOSE 136* 147*  BUN 22* 18  CREATININE 4.82* 4.89*  CALCIUM 8.5* 8.6*   No results for input(s): PTH in the last 72 hours. Iron Studies:  Recent Labs  11/09/15 1557  IRON 35  TIBC 190*  FERRITIN 661*    Studies/Results: No results found.  I have reviewed the patient's current medications.  Assessment/Plan: 1 ESRD PD going well. Low K, will replete po now 2 Anemia stable 3 HPTH no value 4 Malnutrition 5 SZ controlled 6 Debill/dispo  If to go to NH will need to go on HD, and need PC and perm access.  Need to know direction of care P Pd with 1.5%, see what plans are, give K    LOS: 4 days   Sherri Davidson L 11/12/2015,8:04 AM

## 2015-11-12 NOTE — Progress Notes (Signed)
[place] TEAM 1 - Stepdown/ICU TEAM  Sherri Davidson  OVZ:[id number] DOB: 05-Jun-1956 DOA: 11/08/2015 PCP: [facility]    Brief Narrative:  60 y.o. female with history of CVA, HTN, CHF, hypothyroidism, DM2, and ESRD on peritoneal hemodialysis daily who presented with seizures.  Around 1pm the patient was noted to have her first seizure that lasted 5-6 minutes with generalized tonic-clonic movements. EMS was called and patient was able to return to baseline, at which time she refused transport to the hospital. Around 2:18 PM she had a second seizure with acute left-sided facial drooping for which EMS was again called. Patient was very lethargic and altered after her seizing stoped. In the ambulance the patient had a third seizure. Patient was recently taken off of [place] by her Neurologist (Dr. Melrose Nakayama at [facility]). Dilantin was stopped in 06/2015 after the patient had repeated falls. Her last prior seizure activity was 05/2015.  In the ED CT and MRI of the brain showed previous right sided CVA, but no acute abnormalities. UA was positive. UDS negative. Patient was evaluated by Neurology who recommended continuation of Keppra 500 mg daily and Dilantin 100 mg 3 times a day.   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 Encephalopathy is proving very slow to improve - Sz meds per Neurology -  ammonia normal - check B12 and folate - dilantin level at low therapeutic range - has had MRI this admit w/o acute findings   Dysphagia SLP has evaluated and suggests D1 diet w/ nectar liquids   End-stage renal disease on peritoneal dialysis PD per Nephrology - pt will NOT be a SNF candidate as long as she is on PD - only option for SNF placement will be to convert to HD - presently the pt's mental status prohibits [place] from having this discussion w/ her - Palliative Care consulted to help establish goals of care, as initiating HD in this pt would not be  appropriate unless her MS improves markedly   Possible Urinary tract infection Urine is abnormal, but unclear how often pt urinates/if she makes regular urine - d/c abx and follow clinically   DM2 CBG well controlled   Hyperkalemia Resolved w/ resumption of PD - now hypokalemic - gently replace   Pressure ulcer v/s MASD See WOC note for wound info and tx recs   MRSA screen +  DVT prophylaxis: SQ heparin  Code Status: FULL CODE Family Communication: no family present at time of exam  Disposition Plan: SDU until mental status improved or goals of care better defined    Consultants:  Nephrology  Neurology Palliative Care   Procedures: none  Antimicrobials:  Ceftriaxone 5/27 > 5/30  Subjective: The patient is lethargic.  She does not awaken to my voice or my exam.  She cannot provide a history.  She does not appear uncomfortable however.  Objective: Blood pressure 81/63, pulse 86, temperature 98.5 F (36.9 C), temperature source Axillary, resp. rate 22, height '5\' 6"'$  (1.676 m), weight 57 kg (125 lb 10.6 oz), SpO2 100 %.  Intake/Output Summary (Last 24 hours) at 11/12/15 1324 Last data filed at 11/12/15 0914  Gross per 24 hour  Intake  12090 ml  Output  13910 ml  Net  -1820 ml   Filed Weights   11/11/15 0457 11/11/15 1305 11/12/15 0444  Weight: 58.1 kg (128 lb 1.4 oz) 52.205 kg (115 lb 1.5 oz) 57 kg (125 lb 10.6 oz)    Examination:  General: No acute respiratory distress - lethargic   Lungs: Clear to auscultation bilaterally  Cardiovascular: Regular rate and rhythm without murmur  Abdomen: Nondistended, soft, bowel sounds positive, PD cath insertion site clean an dry  Extremities: No significant cyanosis, clubbing, edema bilateral lower extremities  CBC:  Recent Labs Lab 11/08/15 1559 11/09/15 0255 11/10/15 0227 11/11/15 0447 11/12/15 0316  WBC 10.6* 8.4 7.3 7.9 7.8  NEUTROABS 9.0*  --   --   --   --   HGB 10.5* 9.5* 10.2* 11.1* 11.1*  HCT 35.3* 31.4*  33.8* 36.5 37.1  MCV 86.5 86.3 86.4 86.5 85.9  PLT 386 356 371 342 962   Basic Metabolic Panel:  Recent Labs Lab 11/08/15 1559 11/09/15 0255 11/10/15 0227 11/11/15 0447 11/12/15 0316  NA 137 141 142 139 141  K 5.3* 5.2* 4.2 4.2 2.8*  CL 107 109 110 106 105  CO2 20* 22 22 19* 24  GLUCOSE 179* 120* 136* 136* 147*  BUN 30* 33* 30* 22* 18  CREATININE 5.17* 5.54* 5.51* 4.82* 4.89*  CALCIUM 9.2 8.9 8.8* 8.5* 8.6*  MG  --   --   --   --  2.0  PHOS  --   --  6.5* 6.1*  --    GFR: Estimated Creatinine Clearance: 11.1 mL/min (by C-G formula based on Cr of 4.89).  Liver Function Tests:  Recent Labs Lab 11/08/15 1559 11/10/15 0227 11/11/15 0447  AST 32  --   --   ALT 14  --   --   ALKPHOS 98  --   --   BILITOT 0.3  --   --   PROT 7.1  --   --   ALBUMIN 2.1* 2.1* 2.1*    Recent Labs Lab 11/09/15 0635  AMMONIA 17    Coagulation Profile:  Recent Labs Lab 11/08/15 1559  INR 1.06    HbA1C: HEMOGLOBIN A1C  Date/Time Value Ref Range Status  05/31/2014 04:55 AM 8.0* 4.2-6.3 % Final    Comment:    The American Diabetes Association recommends that a primary goal of therapy should be <7% and that physicians should reevaluate the treatment regimen in patients with HbA1c values consistently >8%.     CBG:  Recent Labs Lab 11/11/15 2106 11/12/15 0041 11/12/15 0409 11/12/15 0809 11/12/15 1216  GLUCAP 137* 105* 162* 84 168*    Recent Results (from the past 240 hour(s))  MRSA PCR Screening     Status: Abnormal   Collection Time: 11/08/15  9:58 PM  Result Value Ref Range Status   MRSA by PCR POSITIVE (A) NEGATIVE Final    Comment:        The GeneXpert MRSA Assay (FDA approved for NASAL specimens only), is one component of a comprehensive MRSA colonization surveillance program. It is not intended to diagnose MRSA infection nor to guide or monitor treatment for MRSA infections. RESULT CALLED TO, READ BACK BY AND VERIFIED WITH: [name],[name] 0055 11/09/15  MITCHELL,L      Scheduled Meds: . Chlorhexidine Gluconate Cloth  6 each Topical Q0600  . darbepoetin (ARANESP) injection - NON-DIALYSIS  60 mcg Subcutaneous Q Mon-1800  . dialysis solution 2.5% low-MG/low-CA   Intraperitoneal Q24H  . gentamicin cream  1 application Topical Daily  . heparin  5,000 Units Subcutaneous Q8H  . insulin aspart  0-15 Units Subcutaneous Q4H  . levETIRAcetam  500 mg Intravenous Q24H  . levothyroxine  25 mcg Intravenous QAC breakfast  . multivitamin  1 tablet Oral QHS  .  mupirocin ointment  1 application Nasal BID  . phenytoin (DILANTIN) IV  100 mg Intravenous Q8H  . potassium chloride  40 mEq Oral BID     LOS: 4 days   Time spent: 25 minutes   Cherene Altes, MD [facility] Office  [phone] Pager - Text Page per Shea Evans as per below:  On-Call/Text Page:      [email or web address]      password [email or web address]  If 7PM-7AM, please contact night-coverage [email or web address] Password [email or web address] 11/12/2015, 1:24 PM

## 2015-11-13 DIAGNOSIS — Z515 Encounter for palliative care: Secondary | ICD-10-CM | POA: Diagnosis present

## 2015-11-13 DIAGNOSIS — E118 Type 2 diabetes mellitus with unspecified complications: Secondary | ICD-10-CM

## 2015-11-13 DIAGNOSIS — K920 Hematemesis: Secondary | ICD-10-CM | POA: Diagnosis present

## 2015-11-13 DIAGNOSIS — Z7189 Other specified counseling: Secondary | ICD-10-CM

## 2015-11-13 LAB — GLUCOSE, CAPILLARY
GLUCOSE-CAPILLARY: 174 mg/dL — AB (ref 65–99)
Glucose-Capillary: 189 mg/dL — ABNORMAL HIGH (ref 65–99)
Glucose-Capillary: 216 mg/dL — ABNORMAL HIGH (ref 65–99)
Glucose-Capillary: 287 mg/dL — ABNORMAL HIGH (ref 65–99)
Glucose-Capillary: 81 mg/dL (ref 65–99)
Glucose-Capillary: 99 mg/dL (ref 65–99)

## 2015-11-13 LAB — RENAL FUNCTION PANEL
Albumin: 2.3 g/dL — ABNORMAL LOW (ref 3.5–5.0)
Anion gap: 16 — ABNORMAL HIGH (ref 5–15)
BUN: 19 mg/dL (ref 6–20)
CHLORIDE: 105 mmol/L (ref 101–111)
CO2: 21 mmol/L — ABNORMAL LOW (ref 22–32)
Calcium: 9.1 mg/dL (ref 8.9–10.3)
Creatinine, Ser: 4.81 mg/dL — ABNORMAL HIGH (ref 0.44–1.00)
GFR, EST AFRICAN AMERICAN: 10 mL/min — AB (ref >60)
GFR, EST NON AFRICAN AMERICAN: 9 mL/min — AB (ref >60)
Glucose, Bld: 82 mg/dL (ref 65–99)
POTASSIUM: 3.7 mmol/L (ref 3.5–5.1)
Phosphorus: 6.1 mg/dL — ABNORMAL HIGH (ref 2.5–4.6)
Sodium: 142 mmol/L (ref 135–145)

## 2015-11-13 LAB — HEMOGLOBIN A1C
Hgb A1c MFr Bld: 5.6 % (ref 4.8–5.6)
Mean Plasma Glucose: 114 mg/dL

## 2015-11-13 LAB — TYPE AND SCREEN
ABO/RH(D): O NEG
Antibody Screen: NEGATIVE

## 2015-11-13 LAB — ABO/RH: ABO/RH(D): O NEG

## 2015-11-13 LAB — HEMOGLOBIN AND HEMATOCRIT, BLOOD
HCT: 40.7 % (ref 36.0–46.0)
HCT: 43.5 % (ref 36.0–46.0)
HEMOGLOBIN: 13.1 g/dL (ref 12.0–15.0)
Hemoglobin: 12.3 g/dL (ref 12.0–15.0)

## 2015-11-13 LAB — C DIFFICILE QUICK SCREEN W PCR REFLEX
C DIFFICLE (CDIFF) ANTIGEN: NEGATIVE
C Diff interpretation: NEGATIVE
C Diff toxin: NEGATIVE

## 2015-11-13 LAB — VITAMIN B12: VITAMIN B 12: 954 pg/mL — AB (ref 180–914)

## 2015-11-13 LAB — HIV ANTIBODY (ROUTINE TESTING W REFLEX): HIV SCREEN 4TH GENERATION: NONREACTIVE

## 2015-11-13 LAB — OCCULT BLOOD X 1 CARD TO LAB, STOOL: Fecal Occult Bld: NEGATIVE

## 2015-11-13 LAB — RPR: RPR Ser Ql: NONREACTIVE

## 2015-11-13 LAB — FOLATE: FOLATE: 13.8 ng/mL (ref >5.9)

## 2015-11-13 MED ORDER — SODIUM CHLORIDE 0.9 % IV SOLN
8.0000 mg/h | INTRAVENOUS | Status: DC
  Administered 2015-11-13: 8 mg/h via INTRAVENOUS
  Filled 2015-11-13 (×4): qty 80

## 2015-11-13 MED ORDER — SODIUM CHLORIDE 0.9 % IV SOLN: Freq: Once | INTRAVENOUS | Status: DC

## 2015-11-13 MED ORDER — PANTOPRAZOLE SODIUM 40 MG IV SOLR
40.0000 mg | Freq: Two times a day (BID) | INTRAVENOUS | Status: DC
Start: 2015-11-17 — End: 2015-11-14

## 2015-11-13 MED ORDER — LOPERAMIDE HCL 2 MG PO CAPS
2.0000 mg | ORAL_CAPSULE | ORAL | Status: DC | PRN
  Administered 2015-11-13 – 2015-11-19 (×5): 2 mg via ORAL
  Filled 2015-11-13 (×5): qty 1

## 2015-11-13 MED ORDER — SODIUM CHLORIDE 0.9 % IV SOLN
80.0000 mg | Freq: Once | INTRAVENOUS | Status: AC
  Administered 2015-11-13: 80 mg via INTRAVENOUS
  Filled 2015-11-13: qty 80

## 2015-11-13 NOTE — Progress Notes (Signed)
Speech Language Pathology Treatment: Dysphagia  Patient Details Name: Sherri Davidson MRN: [id number] DOB: 1956-06-01 Today's Date: 11/13/2015 Time: [id number] SLP Time Calculation (min) (ACUTE ONLY): 14 min  Assessment / Plan / Recommendation Clinical Impression  Pt alert and anxious to trial thin water. She and the RN report no overt s/s aspiration with current diet (although pt was unwilling to eat any of the current consistency diet during therapy). Pt was positioned upright and trialed with ice chips without overt s/s aspiration. Teaspoons of thin resulted in intermittent delayed throat clear and small cup sips of thin resulted in delayed wet cough response. At this time, recommend continuation of Dys 1 with nectar. If aggressive management is chosen in goals of care discussion, may consider MBSS for objective measure of swallow function. If pt elects to proceed with comfort measures, may consider liberalizing current diet to include thin water given quality of life concerns. Will follow.   HPI HPI: 60 y.o. female with history of CVA, HTN, CHF, hypothyroidism, DM2, and ESRD on peritoneal hemodialysis daily who presented with seizures.  Dx include seizures/Todd's paralysis/acute encephalopathy; dysphagia (coughing with clear liquids); possible UTI.  CXR Persistent right pleural effusion and right lower lung consolidation/atelectasis.      SLP Plan  Continue with current plan of care (pending goals of care discussion)     Recommendations  Diet recommendations: Dysphagia 1 (puree);Nectar-thick liquid Medication Administration: Whole meds with puree Supervision: Staff to assist with self feeding;Full supervision/cueing for compensatory strategies Compensations: Small sips/bites Postural Changes and/or Swallow Maneuvers: Seated upright 90 degrees             Oral Care Recommendations: Oral care BID Follow up Recommendations:  (TBA) Plan: Continue with current plan of care (pending goals of  care discussion)     [place] MA, [place] Pager [phone] 11/13/2015, 3:48 PM

## 2015-11-13 NOTE — Progress Notes (Signed)
PROGRESS NOTE    Sherri Davidson  HFW:[id number] DOB: 10/18/1955 DOA: 11/08/2015 PCP: [facility]   Brief Narrative:  60 y.o. WF PMHx CVA, HTN, CHF, hypothyroidism, DM2, and ESRD on peritoneal hemodialysis daily who presented with seizures. Around 1pm the patient was noted to have her first seizure that lasted 5-6 minutes with generalized tonic-clonic movements. EMS was called and patient was able to return to baseline, at which time she refused transport to the hospital. Around 2:18 PM she had a second seizure with acute left-sided facial drooping for which EMS was again called. Patient was very lethargic and altered after her seizing stoped. In the ambulance the patient had a third seizure. Patient was recently taken off of [place] by her Neurologist (Dr. Melrose Nakayama at [facility]). Dilantin was stopped in 06/2015 after the patient had repeated falls. Her last prior seizure activity was 05/2015.  In the ED CT and MRI of the brain showed previous right sided CVA, but no acute abnormalities. UA was positive. UDS negative. Patient was evaluated by Neurology who recommended continuation of Keppra 500 mg daily and Dilantin 100 mg 3 times a day.    Assessment & Plan:   Principal Problem:   Seizures ([place]) Active Problems:   Pressure ulcer   UTI (lower urinary tract infection)   Acute encephalopathy   Hyperkalemia   Todd's paralysis ([name])   ESRD on peritoneal dialysis ([place])   Hallucinations   Dysphagia   Controlled diabetes mellitus type 2 with complications ([name])   Coffee ground emesis    Seizures / Todd's paralysis / acute encephalopathy -Per her sister/caregiver/POA Ms.Jaunita App patient usually is A/O 4 at home. Also states patient's primary neurologist had taken patient off of Dilantin secondary to increased lethargy. -Hallucinations have resolved.   -5/31 Dilantin level = 11.7  -Dilantin 100 mg BID per neurology recommendation -Continue Keppra 500 mg  daily  CVA -CT and MRI showed multiple old infarcts. Sister aware  Hallucinations -Will continue all medications per neurology but would not add any further psychogenic medication -6/1 resolved   Dysphagia -5/30 Passed swallow evaluation; dysphagia 1 nectar thick liquid    Possible Urinary tract infection -Urine is abnormal, but unclear how often pt urinates/if she makes regular urine  -No urine culture pending -Completed 3 days of empiric antibiotics   End-stage renal disease on peritoneal dialysis -PD per Nephrology  -Spoke at length with [place] nephrology, who is going to speak with her regular nephrologist. Unsure patient will be able to continue PD at home, and unfortunately ability to place patient in SNF which would provide PD slim. Patient also not good candidate for HD. Will await further recommendations -Patient's sister/caregiver/POA Ms.Jaunita [place]ton per nursing staff has departed on vacation. Will attempt to contact via phone, to discuss plan of care.    DM type 2 controlled with complication -0/27 Hemoglobin A1c= 5.6 -Lipid panel; within ADA guideline -Moderate SSI  Hyperkalemia -Resolved w/ resumption of PD  Pressure ulcer v/s MASD See WOC note for wound info and tx recs   Coffee-ground emesis -6/1 Paged at [facility] that patient has had several episodes of coffee-ground emesis just now. -Discontinue heparin products -Nothing by mouth -H/H q4 hr -Gastroccult card. If positive will contact GI in A.m. -Protonix drip -Type and cross -Transfuse for hemoglobin<7  Goals of care -Will attempt to contact patient's sister/caregiver/POA Ms.Jaunita Stepanek who is on vacation I/O discuss when we can have meeting to discuss short-term/long-term goals care  DVT prophylaxis: Subcutaneous heparin Code Status: Full Family Communication: sister/caregiver/POA Ms.Jaunita Stanger  Disposition Plan: SNF(Unlikely given patient on PD)   Consultants:  Dr.James  [facility]  Neurology    Procedures/Significant Events:  5/27 CT head WO contrast;-Changes of prior infarcts are noted in the distribution of the right middle cerebral artery. -Lacunar infarct again identified on the right in the basal ganglia. No acute hemorrhage, acute infarction or space-occupying mass lesion.  5/27 MRA head;-Findings suspicious for prominent intracranial atherosclerotic changes including significant stenosis left internal carotid artery cavernous segment, significant posterior cerebral artery narrowing greater on the left and decrease number of visualized right middle cerebral artery branch vessels C/W remote right hemispheric infarct. 5/27 MRI brain;-Remote large right hemispheric infarct with encephalomalacia right frontal lobe, parietal lobe and temporal lobe with subsequent dilation right temporal horn. -Remote infarct right corona radiata/lenticular nucleus. Remote small infarct left caudate.Remote small infarct left thalamus.    Cultures 5/27 MRSA by PCR positive   Antimicrobials: Ceftriaxone 5/27 > 5/30   Devices  LINES / TUBES:      Continuous Infusions: . pantoprozole (PROTONIX) infusion       Subjective:  6/1  A/O  3  (does not  no when ).  Patient curled up in a fetal position in her own feces. No longer hallucinating.     Objective: Filed Vitals:   11/13/15 0600 11/13/15 0832 11/13/15 1323 11/13/15 1600  BP: 104/74 108/68 99/73 100/73  Pulse: 95 92 100 104  Temp:  98.4 F (36.9 C) 98.3 F (36.8 C) 99.2 F (37.3 C)  TempSrc:  Oral Oral Oral  Resp: '16 20 16 25  '$ Height:      Weight:      SpO2:  95% 95%     Intake/Output Summary (Last 24 hours) at 11/13/15 1905 Last data filed at 11/13/15 1134  Gross per 24 hour  Intake  12474 ml  Output  13341 ml  Net   -867 ml   Filed Weights   11/11/15 1305 11/12/15 0444 11/13/15 0413  Weight: 52.205 kg (115 lb 1.5 oz) 57 kg (125 lb 10.6 oz) 54.1 kg (119 lb 4.3 oz)     Examination: General: A/O  3  (does not  no when ), follows commands, cachectic No acute respiratory distress Eyes: negative scleral hemorrhage, negative anisocoria, negative icterus ENT: Negative Runny nose, negative gingival bleeding, Neck:  Negative scars, masses, torticollis, lymphadenopathy, JVD Lungs: Clear to auscultation bilaterally without wheezes or crackles Cardiovascular: Regular rate and rhythm without murmur gallop or rub normal S1 and S2 Abdomen: negative abdominal pain, nondistended, positive soft, bowel sounds, no rebound, no ascites, no appreciable mass Extremities: No significant cyanosis, clubbing, or edema bilateral lower extremities Skin: Negative rashes, lesions, ulcers Psychiatric:  Positive hallucinations (seen people in the room were not there)  Central nervous system:  Moves all extremities, follows some commands,  negative dysarthria, negative expressive aphasia, negative receptive aphasia.  .     Data Reviewed: Care during the described time interval was provided by me .  I have reviewed this patient's available data, including medical history, events of note, physical examination, and all test results as part of my evaluation. I have personally reviewed and interpreted all radiology studies.  CBC:  Recent Labs Lab 11/08/15 1559 11/09/15 0255 11/10/15 0227 11/11/15 0447 11/12/15 0316  WBC 10.6* 8.4 7.3 7.9 7.8  NEUTROABS 9.0*  --   --   --   --   HGB 10.5* 9.5* 10.2* 11.1* 11.1*  HCT 35.3* 31.4* 33.8* 36.5 37.1  MCV 86.5 86.3 86.4 86.5 85.9  PLT 386 356 371 342 287   Basic Metabolic Panel:  Recent Labs Lab 11/09/15 0255 11/10/15 0227 11/11/15 0447 11/12/15 0316 11/13/15 0340  NA 141 142 139 141 142  K 5.2* 4.2 4.2 2.8* 3.7  CL 109 110 106 105 105  CO2 22 22 19* 24 21*  GLUCOSE 120* 136* 136* 147* 82  BUN 33* 30* 22* 18 19  CREATININE 5.54* 5.51* 4.82* 4.89* 4.81*  CALCIUM 8.9 8.8* 8.5* 8.6* 9.1  MG  --   --   --  2.0  --   PHOS   --  6.5* 6.1*  --  6.1*   GFR: Estimated Creatinine Clearance: 10.8 mL/min (by C-G formula based on Cr of 4.81). Liver Function Tests:  Recent Labs Lab 11/08/15 1559 11/10/15 0227 11/11/15 0447 11/13/15 0340  AST 32  --   --   --   ALT 14  --   --   --   ALKPHOS 98  --   --   --   BILITOT 0.3  --   --   --   PROT 7.1  --   --   --   ALBUMIN 2.1* 2.1* 2.1* 2.3*   No results for input(s): LIPASE, AMYLASE in the last 168 hours.  Recent Labs Lab 11/09/15 0635  AMMONIA 17   Coagulation Profile:  Recent Labs Lab 11/08/15 1559  INR 1.06   Cardiac Enzymes: No results for input(s): CKTOTAL, CKMB, CKMBINDEX, TROPONINI in the last 168 hours. BNP (last 3 results) No results for input(s): PROBNP in the last 8760 hours. HbA1C:  Recent Labs  11/12/15 0320  HGBA1C 5.6   CBG:  Recent Labs Lab 11/13/15 0029 11/13/15 0412 11/13/15 0832 11/13/15 1303 11/13/15 1753  GLUCAP 216* 99 189* 287* 81   Lipid Profile:  Recent Labs  11/12/15 0315  CHOL 119  HDL 60  LDLCALC 42  TRIG 85  CHOLHDL 2.0   Thyroid Function Tests: No results for input(s): TSH, T4TOTAL, FREET4, T3FREE, THYROIDAB in the last 72 hours. Anemia Panel:  Recent Labs  11/13/15 0340  VITAMINB12 954*  FOLATE 13.8   Urine analysis:    Component Value Date/Time   COLORURINE YELLOW 11/08/2015 1707   COLORURINE Yellow 05/30/2014 1618   APPEARANCEUR TURBID* 11/08/2015 1707   APPEARANCEUR Clear 05/30/2014 1618   LABSPEC 1.018 11/08/2015 1707   LABSPEC 1.009 05/30/2014 1618   PHURINE 7.5 11/08/2015 1707   PHURINE 7.0 05/30/2014 1618   GLUCOSEU 100* 11/08/2015 1707   GLUCOSEU >=500 05/30/2014 1618   HGBUR LARGE* 11/08/2015 1707   HGBUR Negative 05/30/2014 1618   BILIRUBINUR NEGATIVE 11/08/2015 1707   BILIRUBINUR Negative 05/30/2014 1618   KETONESUR NEGATIVE 11/08/2015 1707   KETONESUR Negative 05/30/2014 1618   PROTEINUR >300* 11/08/2015 1707   PROTEINUR 100 mg/dL 05/30/2014 1618   NITRITE  NEGATIVE 11/08/2015 1707   NITRITE Negative 05/30/2014 1618   LEUKOCYTESUR LARGE* 11/08/2015 1707   LEUKOCYTESUR Negative 05/30/2014 1618   Sepsis Labs: '@LABRCNTIP'$ (procalcitonin:4,lacticidven:4)  ) Recent Results (from the past 240 hour(s))  MRSA PCR Screening     Status: Abnormal   Collection Time: 11/08/15  9:58 PM  Result Value Ref Range Status   MRSA by PCR POSITIVE (A) NEGATIVE Final    Comment:        The GeneXpert MRSA Assay (FDA approved for NASAL specimens only), is one component of a comprehensive MRSA colonization surveillance program. It  is not intended to diagnose MRSA infection nor to guide or monitor treatment for MRSA infections. RESULT CALLED TO, READ BACK BY AND VERIFIED WITH: [name],[name] [phone] 11/09/15 MITCHELL,L   C difficile quick scan w PCR reflex     Status: None   Collection Time: 11/12/15 10:47 PM  Result Value Ref Range Status   C Diff antigen NEGATIVE NEGATIVE Final   C Diff toxin NEGATIVE NEGATIVE Final   C Diff interpretation Negative for toxigenic C. difficile  Final         Radiology Studies: No results found.      Scheduled Meds: . darbepoetin (ARANESP) injection - NON-DIALYSIS  60 mcg Subcutaneous Q Mon-1800  . gentamicin cream  1 application Topical Daily  . insulin aspart  0-15 Units Subcutaneous Q4H  . levETIRAcetam  500 mg Intravenous Q24H  . levothyroxine  50 mcg Oral QAC breakfast  . multivitamin  1 tablet Oral QHS  . pantoprazole (PROTONIX) IVPB  80 mg Intravenous Once  . [START ON 11/17/2015] pantoprazole  40 mg Intravenous Q12H  . phenytoin (DILANTIN) IV  100 mg Intravenous Q12H   Continuous Infusions: . pantoprozole (PROTONIX) infusion       LOS: 5 days    Time spent: 40 minutes    WOODS, Geraldo Docker, MD [facility] Pager [phone]   If 7PM-7AM, please contact night-coverage [email or web address] Password [email or web address] 11/13/2015, 7:05 PM

## 2015-11-13 NOTE — Progress Notes (Signed)
PT Cancellation Note  Patient Details Name: Sherri Davidson MRN: [id number] DOB: May 12, 1956   Cancelled Treatment:    Reason Eval/Treat Not Completed: Other (comment) (Pt's flexiseal out of place and pt needs to be cleaned up.)  RN notified.  PT will follow.  Collie Siad PT, DPT  Pager: [phone] Phone: [phone] 11/13/2015, 12:15 PM

## 2015-11-13 NOTE — Progress Notes (Signed)
No charge note  Spoke with Sister, Ylianna Almanzar.  We will meet at bedside on 6/2 to discuss [place].  Imogene Burn, [place] [facility] Pager: [phone]

## 2015-11-13 NOTE — Progress Notes (Signed)
Subjective: Interval History: has complaints thirsty.  Objective: Vital signs in last 24 hours: Temp:  [97.9 F (36.6 C)-98.5 F (36.9 C)] 98.1 F (36.7 C) (06/01 0413) Pulse Rate:  [85-98] 95 (06/01 0600) Resp:  [13-23] 16 (06/01 0600) BP: (81-135)/(52-92) 104/74 mmHg (06/01 0600) SpO2:  [93 %-95 %] 93 % (06/01 0413) Weight:  [54.1 kg (119 lb 4.3 oz)] 54.1 kg (119 lb 4.3 oz) (06/01 0413) Weight change: 1.895 kg (4 lb 2.8 oz)  Intake/Output from previous day: 05/31 0701 - 06/01 0700 In: 100 [IV Piggyback:100] Out: -  Intake/Output this shift:    General appearance: cooperative, pale and emaciated, Ox1, doesn't know she is on dialysis Resp: diminished breath sounds bilaterally Cardio: S1, S2 normal GI: pos bs, mild distension, pos FW, PD cath L mid abdm Extremities: atrophied  Lab Results:  Recent Labs  11/11/15 0447 11/12/15 0316  WBC 7.9 7.8  HGB 11.1* 11.1*  HCT 36.5 37.1  PLT 342 344   BMET:  Recent Labs  11/12/15 0316 11/13/15 0340  NA 141 142  K 2.8* 3.7  CL 105 105  CO2 24 21*  GLUCOSE 147* 82  BUN 18 19  CREATININE 4.89* 4.81*  CALCIUM 8.6* 9.1   No results for input(s): PTH in the last 72 hours. Iron Studies: No results for input(s): IRON, TIBC, TRANSFERRIN, FERRITIN in the last 72 hours.  Studies/Results: No results found.  I have reviewed the patient's current medications.  Assessment/Plan: 1 ESRD PD going well  Vol ok.  Mild acidemia, solute ok. 2 Anemia stable 3 SZ controlled 4 Confusion  If not better, not candidate for ongoing therapy.  5 Debill/confusion if to go to NH, will need to improve Ms, get HD access,  6 Malnutrition P PD, ?? Goals of care, follow chem, cont esa    LOS: 5 days   Maie Kesinger L 11/13/2015,8:25 AM

## 2015-11-13 NOTE — Progress Notes (Signed)
Pt started having projectile vomiting coffee ground in large amount and at the same time having a lot of  loose stool yellow color, PRN zofran was given, put a rectal pouch.MD was paged. Pt alert but appears weak,VSS, will continue to monitor.

## 2015-11-13 NOTE — Progress Notes (Signed)
CSW spoke with [facility] social worker who has spoken with pt sister and pt and plan is now to return home since no facility able to accommodate peritoneal dialysis.  [facility] can set up home services an equipment and reports they do not need help from [facility].  CSW signing off  Domenica Reamer, [place] Social Worker [phone]

## 2015-11-13 NOTE — Consult Note (Signed)
Consultation Note Date: 11/13/2015   Patient Name: Sherri Davidson  DOB: 14-Mar-1956  MRN: [id number]  Age / Sex: 60 y.o., female  PCP: [facility] Referring Physician: Allie Bossier, MD  Reason for Consultation: Establishing goals of care  HPI/Patient Profile: 60 y.o. female  with past medical history of CVA, seizures, diastolic CHF, DM, ESRD on PD, and dysphagia.  She was admitted on 11/08/2015 after having multiple seizures. The patient also appears to have acute encephalopathy and diarrhea.  She was c-diff negative as of 5/31. According to the notes she was taken off Dilantin in 06/2015 due to falls, and was recently taken off [place] by her neurologist (Dr. Melrose Nakayama at [facility]). Neurology was consulted on admission and has restarted Keppra and Dilantin.  The patient remains confused.  On our exam she held her arm straight up in the air for no apparent reason while we talked.  She also had moments where she would stop talking and simply stare at the ceiling.  She tells me she came to the hospital to receive peritoneal dialysis and that she has not had a bowel movement since admission.  The patient is incontinent of stool.  Late in the day on 6/1 the patient was noted to have had several episodes of coffee ground emesis.  Clinical Assessment and Goals of Care: Patient is currently poorly nourished (albumin 2.3).  Has poor functional status and appears encephalopathic (possibly due to a GI bleed).  She is currently unable to discuss [place].  NEXT OF KIN:  Sister Sherri Davidson  SUMMARY OF RECOMMENDATIONS   Will meet 6/2 PM with sister, Sherri Davidson and patient at bedside to attempt to discuss [place].   Code Status/Advance Care Planning:  Full code    Symptom Management:   Per primary attending MD.  Palliative Prophylaxis:   Delirium Protocol, Aspiration precautions.  Additional  Recommendations (Limitations, Scope, Preferences):  Full Scope Treatment   Prognosis:  Likely less than 6 months.  Secondary to ESRD, CVA, Seizures, CHF, dysphagia and likely some degree of dementia.  Discharge Planning: To Be Determined pending the patient's medical evolution.  Will likely D/C to sisters home and be followed by [facility].     Primary Diagnoses: Present on Admission:  . UTI (lower urinary tract infection) . Pressure ulcer . Acute encephalopathy . Hyperkalemia . Todd's paralysis ([place]) . ESRD on peritoneal dialysis ([place]) . Hallucinations . Dysphagia . Controlled diabetes mellitus type 2 with complications ([place]) . Coffee ground emesis  I have reviewed the medical record, interviewed the patient and family, and examined the patient. The following aspects are pertinent.  Past Medical History  Diagnosis Date  . CVA, old, hemiparesis ([place])   . Hypertension   . Diabetes ([place])   . ESRD on peritoneal dialysis ([place])   . CHF (congestive heart failure) ([place])   . Hypothyroidism   . Seizures ([place])   . Stroke [facility])    Social History   Social History  . Marital Status: Single  Spouse Name: N/A  . Number of Children: N/A  . Years of Education: N/A   Occupational History  . retired    Social History Main Topics  . Smoking status: Former Smoker -- 1.00 packs/day for 30 years    Types: Cigarettes  . Smokeless tobacco: None  . Alcohol Use: No  . Drug Use: No  . Sexual Activity: Not Asked   Other Topics Concern  . None   Social History Narrative   History reviewed. No pertinent family history. Scheduled Meds: . sodium chloride   Intravenous Once  . darbepoetin (ARANESP) injection - NON-DIALYSIS  60 mcg Subcutaneous Q Mon-1800  . gentamicin cream  1 application Topical Daily  . insulin aspart  0-15 Units Subcutaneous Q4H  . levETIRAcetam  500 mg Intravenous Q24H  . levothyroxine  50 mcg Oral QAC breakfast  . multivitamin  1 tablet Oral QHS  .  pantoprazole (PROTONIX) IVPB  80 mg Intravenous Once  . [START ON 11/17/2015] pantoprazole  40 mg Intravenous Q12H  . phenytoin (DILANTIN) IV  100 mg Intravenous Q12H   Continuous Infusions: . pantoprozole (PROTONIX) infusion     PRN Meds:.[DISCONTINUED] acetaminophen **OR** acetaminophen, acetaminophen, albuterol, loperamide, LORazepam, [DISCONTINUED] ondansetron **OR** ondansetron (ZOFRAN) IV, RESOURCE THICKENUP CLEAR Medications Prior to Admission:  Prior to Admission medications   Medication Sig Start Date End Date Taking? Authorizing Provider  aspirin EC 81 MG tablet Take 81 mg by mouth daily. For heart protection   Yes Historical Provider, MD  ciprofloxacin (CIPRO) 500 MG tablet Take 750 mg by mouth See admin instructions. Take 1 1/2 tablet (750 mg) by mouth once a day after dialysis if instructed to take by PD nurse for  peritonitis. *Only take if directed by kidney doctor*   Yes Historical Provider, MD  citalopram (CELEXA) 10 MG tablet Take 10 mg by mouth daily as needed (depression).    Yes Historical Provider, MD  clopidogrel (PLAVIX) 75 MG tablet Take 75 mg by mouth daily. For heart protection   Yes Historical Provider, MD  gentamicin (GARAMYCIN) 0.3 % ophthalmic solution Apply 2 drops topically daily. Apply to wound site daily   Yes Historical Provider, MD  hydrOXYzine (VISTARIL) 25 MG capsule Take 25 mg by mouth daily. For itching   Yes Historical Provider, MD  levETIRAcetam (KEPPRA) 500 MG tablet Take 1 tablet (500 mg total) by mouth 2 (two) times daily. Patient taking differently: Take 500 mg by mouth daily. For seizure prevention 05/28/15  Yes Vaughan Basta, MD  levothyroxine (SYNTHROID, LEVOTHROID) 50 MCG tablet Take 50 mcg by mouth daily. For hypothyroidism   Yes Historical Provider, MD  loperamide (IMODIUM) 2 MG capsule Take 2 mg by mouth See admin instructions. Take 1 capsule (2 mg) by mouth every mornin , may also  take 1 capsule (2 mg) after each loose stool if needed  (max 4 capsules in 24 hours)   Yes Historical Provider, MD  oxyCODONE-acetaminophen (PERCOCET/ROXICET) 5-325 MG tablet Take 1 tablet by mouth every 6 (six) hours as needed (pain).    Yes Historical Provider, MD  pantoprazole (PROTONIX) 40 MG tablet Take 40 mg by mouth daily.    Yes Historical Provider, MD  potassium chloride SA (K-DUR,KLOR-CON) 20 MEQ tablet Take 20 mEq by mouth at bedtime.   Yes Historical Provider, MD  promethazine (PHENERGAN) 25 MG tablet Take 25 mg by mouth every 8 (eight) hours as needed for nausea or vomiting.   Yes Historical Provider, MD  ranitidine (ZANTAC) 150 MG tablet Take 150  mg by mouth daily as needed for heartburn.   Yes Historical Provider, MD  simvastatin (ZOCOR) 20 MG tablet Take 20 mg by mouth at bedtime. For elevated cholesterol   Yes Historical Provider, MD  Vitamin D, Ergocalciferol, (DRISDOL) 50000 units CAPS capsule Take 50,000 Units by mouth every 30 (thirty) days. On or about the 1st of each month   Yes Historical Provider, MD  ondansetron (ZOFRAN) 4 MG tablet Take 1 tablet (4 mg total) by mouth every 8 (eight) hours as needed for nausea or vomiting. Patient not taking: Reported on 11/08/2015 07/31/15   Nance Pear, MD   Allergies  Allergen Reactions  . Adhesive [Tape] Hives    Please use paper tape  . Salicylates Hives and Nausea Only    Uncoated.  Diona Fanti [Aspirin] Other (See Comments)    GI upset for non-enteric coated aspirin   Review of Systems:  Patient confused and unable to provide  Physical Exam: Thin, frail, confused female.  Holding her arm up in the air and staring blankly at the ceiling CV:  Reg Res:  NAD Abd:  Thin Extremities:  No edema.  Vital Signs: BP 141/90 mmHg  Pulse 108  Temp(Src) 99.2 F (37.3 C) (Oral)  Resp 16  Ht '5\' 6"'$  (1.676 m)  Wt 54.1 kg (119 lb 4.3 oz)  BMI 19.26 kg/m2  SpO2 95% Pain Assessment: No/denies pain   Pain Score: 0-No pain   SpO2: SpO2: 95 % O2 Device:SpO2: 95 % O2 Flow Rate: .   IO:  Intake/output summary:   Intake/Output Summary (Last 24 hours) at 11/13/15 2006 Last data filed at 11/13/15 1134  Gross per 24 hour  Intake  12474 ml  Output  13341 ml  Net   -867 ml    LBM: Last BM Date: 11/13/15 Baseline Weight: Weight: 54.7 kg (120 lb 9.5 oz) Most recent weight: Weight: 54.1 kg (119 lb 4.3 oz)     Palliative Assessment/Data:   Flowsheet Rows        Most Recent Value   Intake Tab    Referral Department  Hospitalist   Unit at Time of Referral  Intermediate Care Unit   Palliative Care Primary Diagnosis  Neurology   Date Notified  11/12/15   Palliative Care Type  New Palliative care   Reason for referral  Clarify Goals of Care   Date of Admission  11/08/15   Date first seen by Palliative Care  11/13/15   # of days Palliative referral response time  1 Day(s)   # of days IP prior to Palliative referral  4   Clinical Assessment    Palliative Performance Scale Score  30%   Psychosocial & Spiritual Assessment    Palliative Care Outcomes       Time In: 2:00   Time Out: 3:00 Time Total: 60 min Greater than 50%  of this time was spent counseling and coordinating care related to the above assessment and plan.  Signed by: Melton Alar, PA-C   Please contact Palliative Medicine Team phone at [phone] for questions and concerns.  For individual provider: See Shea Evans

## 2015-11-14 DIAGNOSIS — N186 End stage renal disease: Secondary | ICD-10-CM

## 2015-11-14 DIAGNOSIS — K920 Hematemesis: Secondary | ICD-10-CM

## 2015-11-14 DIAGNOSIS — Z992 Dependence on renal dialysis: Secondary | ICD-10-CM

## 2015-11-14 LAB — CBC WITH DIFFERENTIAL/PLATELET
Basophils Absolute: 0 10*3/uL (ref 0.0–0.1)
Basophils Relative: 0 %
EOS ABS: 0 10*3/uL (ref 0.0–0.7)
EOS PCT: 0 %
HCT: 42.1 % (ref 36.0–46.0)
HEMOGLOBIN: 12.6 g/dL (ref 12.0–15.0)
LYMPHS ABS: 2.1 10*3/uL (ref 0.7–4.0)
LYMPHS PCT: 14 %
MCH: 26.3 pg (ref 26.0–34.0)
MCHC: 29.9 g/dL — ABNORMAL LOW (ref 30.0–36.0)
MCV: 87.9 fL (ref 78.0–100.0)
Monocytes Absolute: 1.1 10*3/uL — ABNORMAL HIGH (ref 0.1–1.0)
Monocytes Relative: 7 %
Neutro Abs: 11.9 10*3/uL — ABNORMAL HIGH (ref 1.7–7.7)
Neutrophils Relative %: 79 %
PLATELETS: 358 10*3/uL (ref 150–400)
RBC: 4.79 MIL/uL (ref 3.87–5.11)
RDW: 20.8 % — ABNORMAL HIGH (ref 11.5–15.5)
WBC: 15.2 10*3/uL — AB (ref 4.0–10.5)

## 2015-11-14 LAB — COMPREHENSIVE METABOLIC PANEL
ALBUMIN: 2.2 g/dL — AB (ref 3.5–5.0)
ALT: 10 U/L — AB (ref 14–54)
AST: 19 U/L (ref 15–41)
Alkaline Phosphatase: 92 U/L (ref 38–126)
Anion gap: 13 (ref 5–15)
BUN: 21 mg/dL — AB (ref 6–20)
CHLORIDE: 101 mmol/L (ref 101–111)
CO2: 25 mmol/L (ref 22–32)
CREATININE: 5.11 mg/dL — AB (ref 0.44–1.00)
Calcium: 9 mg/dL (ref 8.9–10.3)
GFR calc Af Amer: 10 mL/min — ABNORMAL LOW (ref >60)
GFR, EST NON AFRICAN AMERICAN: 8 mL/min — AB (ref >60)
Glucose, Bld: 194 mg/dL — ABNORMAL HIGH (ref 65–99)
Potassium: 3.7 mmol/L (ref 3.5–5.1)
SODIUM: 139 mmol/L (ref 135–145)
Total Bilirubin: 0.3 mg/dL (ref 0.3–1.2)
Total Protein: 7.5 g/dL (ref 6.5–8.1)

## 2015-11-14 LAB — GLUCOSE, CAPILLARY
GLUCOSE-CAPILLARY: 114 mg/dL — AB (ref 65–99)
GLUCOSE-CAPILLARY: 131 mg/dL — AB (ref 65–99)
GLUCOSE-CAPILLARY: 227 mg/dL — AB (ref 65–99)
GLUCOSE-CAPILLARY: 231 mg/dL — AB (ref 65–99)
Glucose-Capillary: 136 mg/dL — ABNORMAL HIGH (ref 65–99)
Glucose-Capillary: 168 mg/dL — ABNORMAL HIGH (ref 65–99)

## 2015-11-14 LAB — AMMONIA: Ammonia: 20 umol/L (ref 9–35)

## 2015-11-14 LAB — RENAL FUNCTION PANEL
ALBUMIN: 2.1 g/dL — AB (ref 3.5–5.0)
ANION GAP: 16 — AB (ref 5–15)
BUN: 21 mg/dL — ABNORMAL HIGH (ref 6–20)
CALCIUM: 9 mg/dL (ref 8.9–10.3)
CO2: 23 mmol/L (ref 22–32)
Chloride: 101 mmol/L (ref 101–111)
Creatinine, Ser: 4.93 mg/dL — ABNORMAL HIGH (ref 0.44–1.00)
GFR calc non Af Amer: 9 mL/min — ABNORMAL LOW (ref >60)
GFR, EST AFRICAN AMERICAN: 10 mL/min — AB (ref >60)
GLUCOSE: 187 mg/dL — AB (ref 65–99)
PHOSPHORUS: 6.1 mg/dL — AB (ref 2.5–4.6)
POTASSIUM: 3.7 mmol/L (ref 3.5–5.1)
SODIUM: 140 mmol/L (ref 135–145)

## 2015-11-14 LAB — MAGNESIUM: MAGNESIUM: 1.9 mg/dL (ref 1.7–2.4)

## 2015-11-14 LAB — PHOSPHORUS: PHOSPHORUS: 6.2 mg/dL — AB (ref 2.5–4.6)

## 2015-11-14 LAB — OCCULT BLOOD GASTRIC / DUODENUM (SPECIMEN CUP): Occult Blood, Gastric: POSITIVE — AB

## 2015-11-14 LAB — HEMOGLOBIN AND HEMATOCRIT, BLOOD
HEMATOCRIT: 39.1 % (ref 36.0–46.0)
HEMATOCRIT: 41.7 % (ref 36.0–46.0)
HEMOGLOBIN: 12.6 g/dL (ref 12.0–15.0)
Hemoglobin: 11.7 g/dL — ABNORMAL LOW (ref 12.0–15.0)

## 2015-11-14 MED ORDER — LEVETIRACETAM 500 MG PO TABS
500.0000 mg | ORAL_TABLET | Freq: Two times a day (BID) | ORAL | Status: DC
  Administered 2015-11-14 – 2015-11-21 (×14): 500 mg via ORAL
  Filled 2015-11-14 (×14): qty 1

## 2015-11-14 MED ORDER — SODIUM CHLORIDE 0.9 % IV SOLN
INTRAVENOUS | Status: DC
  Administered 2015-11-14: 30 mL/h via INTRAVENOUS
  Administered 2015-11-16 – 2015-11-17 (×3): via INTRAVENOUS

## 2015-11-14 MED ORDER — PANTOPRAZOLE SODIUM 40 MG PO TBEC
40.0000 mg | DELAYED_RELEASE_TABLET | Freq: Two times a day (BID) | ORAL | Status: DC
  Administered 2015-11-14 – 2015-11-21 (×15): 40 mg via ORAL
  Filled 2015-11-14 (×17): qty 1

## 2015-11-14 MED ORDER — ONDANSETRON HCL 4 MG/2ML IJ SOLN
4.0000 mg | Freq: Four times a day (QID) | INTRAMUSCULAR | Status: DC
  Administered 2015-11-15 – 2015-11-21 (×23): 4 mg via INTRAVENOUS
  Filled 2015-11-14 (×22): qty 2

## 2015-11-14 MED ORDER — PHENYTOIN SODIUM EXTENDED 100 MG PO CAPS
100.0000 mg | ORAL_CAPSULE | Freq: Every day | ORAL | Status: DC
  Administered 2015-11-14 – 2015-11-20 (×7): 100 mg via ORAL
  Filled 2015-11-14 (×7): qty 1

## 2015-11-14 NOTE — Progress Notes (Signed)
Pt had two more episodes of ground emesis throughout night. Cont to have loose BM. Immodium given with minimal relief. Vitals stable. Pt AxOx3.

## 2015-11-14 NOTE — Progress Notes (Signed)
PT Cancellation Note  Patient Details Name: Sherri Davidson MRN: [id number] DOB: [birth year]   Cancelled Treatment:    Reason Eval/Treat Not Completed: Patient at procedure or test/unavailable (Pt currently on dialysis).  PT will follow.  Collie Siad PT, DPT  Pager: [phone] Phone: [phone] 11/14/2015, 1:59 PM

## 2015-11-14 NOTE — Progress Notes (Signed)
SLP Cancellation Note  Patient Details Name: JOSEFA SYRACUSE MRN: [id number] DOB: 08/02/1955   Cancelled treatment:       Reason Eval/Treat Not Completed: Medical issues which prohibited therapy. Pt NPO due to coffee ground emesis. Will continue to follow and await results of palliative care consult.   Germain Osgood, M.A. CCC-SLP [phone]  Germain Osgood 11/14/2015, 1:03 PM

## 2015-11-14 NOTE — Consult Note (Addendum)
Consultation  Referring Provider: Dr. Sherral Hammers   Primary Care Physician:  [facility] Primary Gastroenterologist: unassigned / Jill Side, MD [facility], [place])     Reason for Consultation: Coffee-ground emesis          HPI:   Sherri Davidson is a 60 y.o. Caucasian female with a past medical history of CVA, hypertension, CHF, hypothyroidism, diabetes type 2 and ESRD on peritoneal hemodialysis daily, as well as a gastric ulcer found via EGD in 2011, who originally presented to the ED on 11/08/15 via EMS with history of a recent seizure. We are consulted today due to patient's recent episodes of coffee-ground emesis.   At time of interview the patient is able to answer most of my questions, though she does appear somewhat confused and tired and is an unreliable historian. She tells me that she started vomiting 2 days ago and at first it was "normal", when she continued to vomit she saw coffee ground looking material/ Her last episode of emesis was approximately "a few hours ago". Patient tells me that now it appears more "clear". Patient does deny any abdominal pain or history of similar episodes.  Patient denies history of reflux or heartburn or change in bowel habits.  Per referring physician's notes patient had several episodes of coffee-ground emesis on 11/13/2015 and at that time her heparin products were discontinued and she was made nothing by mouth. A Hemoccult card was positive. The patient was started on a Protonix drip which has since been DC'd. The patient's hemoglobin did decrease from 13.1 on 11/13/2015 to a low of 11.7 at 8:00 this morning. Last draw at 1140 hgb was normal at 12.6.  History garnered from previous notes: Past Medical History  Diagnosis Date  . CVA, old, hemiparesis ([place])   . Hypertension   . Diabetes ([place])   . ESRD on peritoneal dialysis ([place])   . CHF (congestive heart failure) ([place])   . Hypothyroidism   . Seizures ([place])   . Stroke  [facility])     Past Surgical History  Procedure Laterality Date  . Cholecystectomy    . Amputation toe    . Insertion of dialysis catheter      History reviewed. No pertinent family history.   Social History  Substance Use Topics  . Smoking status: Former Smoker -- 1.00 packs/day for 30 years    Types: Cigarettes  . Smokeless tobacco: None  . Alcohol Use: No    Prior to Admission medications   Medication Sig Start Date End Date Taking? Authorizing Provider  aspirin EC 81 MG tablet Take 81 mg by mouth daily. For heart protection   Yes Historical Provider, MD  ciprofloxacin (CIPRO) 500 MG tablet Take 750 mg by mouth See admin instructions. Take 1 1/2 tablet (750 mg) by mouth once a day after dialysis if instructed to take by PD nurse for  peritonitis. *Only take if directed by kidney doctor*   Yes Historical Provider, MD  citalopram (CELEXA) 10 MG tablet Take 10 mg by mouth daily as needed (depression).    Yes Historical Provider, MD  clopidogrel (PLAVIX) 75 MG tablet Take 75 mg by mouth daily. For heart protection   Yes Historical Provider, MD  gentamicin (GARAMYCIN) 0.3 % ophthalmic solution Apply 2 drops topically daily. Apply to wound site daily   Yes Historical Provider, MD  hydrOXYzine (VISTARIL) 25 MG capsule Take 25 mg by mouth daily. For itching   Yes Historical Provider, MD  levETIRAcetam (  KEPPRA) 500 MG tablet Take 1 tablet (500 mg total) by mouth 2 (two) times daily. Patient taking differently: Take 500 mg by mouth daily. For seizure prevention 05/28/15  Yes Vaughan Basta, MD  levothyroxine (SYNTHROID, LEVOTHROID) 50 MCG tablet Take 50 mcg by mouth daily. For hypothyroidism   Yes Historical Provider, MD  loperamide (IMODIUM) 2 MG capsule Take 2 mg by mouth See admin instructions. Take 1 capsule (2 mg) by mouth every mornin , may also  take 1 capsule (2 mg) after each loose stool if needed (max 4 capsules in 24 hours)   Yes Historical Provider, MD  oxyCODONE-acetaminophen  (PERCOCET/ROXICET) 5-325 MG tablet Take 1 tablet by mouth every 6 (six) hours as needed (pain).    Yes Historical Provider, MD  pantoprazole (PROTONIX) 40 MG tablet Take 40 mg by mouth daily.    Yes Historical Provider, MD  potassium chloride SA (K-DUR,KLOR-CON) 20 MEQ tablet Take 20 mEq by mouth at bedtime.   Yes Historical Provider, MD  promethazine (PHENERGAN) 25 MG tablet Take 25 mg by mouth every 8 (eight) hours as needed for nausea or vomiting.   Yes Historical Provider, MD  ranitidine (ZANTAC) 150 MG tablet Take 150 mg by mouth daily as needed for heartburn.   Yes Historical Provider, MD  simvastatin (ZOCOR) 20 MG tablet Take 20 mg by mouth at bedtime. For elevated cholesterol   Yes Historical Provider, MD  Vitamin D, Ergocalciferol, (DRISDOL) 50000 units CAPS capsule Take 50,000 Units by mouth every 30 (thirty) days. On or about the 1st of each month   Yes Historical Provider, MD  ondansetron (ZOFRAN) 4 MG tablet Take 1 tablet (4 mg total) by mouth every 8 (eight) hours as needed for nausea or vomiting. Patient not taking: Reported on 11/08/2015 07/31/15   Nance Pear, MD    Current Facility-Administered Medications  Medication Dose Route Frequency Provider Last Rate Last Dose  . 0.9 %  sodium chloride infusion   Intravenous Once Allie Bossier, MD      . 0.9 %  sodium chloride infusion   Intravenous Continuous Allie Bossier, MD 30 mL/hr at 11/14/15 1149 30 mL/hr at 11/14/15 1149  . acetaminophen (TYLENOL) suppository 650 mg  650 mg Rectal Q6H PRN Norval Morton, MD      . acetaminophen (TYLENOL) tablet 650 mg  650 mg Oral Q6H PRN Cherene Altes, MD      . albuterol (PROVENTIL) (2.5 MG/3ML) 0.083% nebulizer solution 2.5 mg  2.5 mg Nebulization Q2H PRN Norval Morton, MD      . Darbepoetin Alfa (ARANESP) injection 60 mcg  60 mcg Subcutaneous Q Mon-1800 Elmarie Shiley, MD   60 mcg at 11/10/15 1807  . gentamicin cream (GARAMYCIN) 0.1 % 1 application  1 application Topical Daily Elmarie Shiley,  MD   1 application at 82/95/62 2121  . insulin aspart (novoLOG) injection 0-15 Units  0-15 Units Subcutaneous Q4H Allie Bossier, MD   2 Units at 11/14/15 [phone]  . levETIRAcetam (KEPPRA) 500 mg in sodium chloride 0.9 % 100 mL IVPB  500 mg Intravenous Q24H Greta Doom, MD   500 mg at 11/14/15 1150  . levothyroxine (SYNTHROID, LEVOTHROID) tablet 50 mcg  50 mcg Oral QAC breakfast Cherene Altes, MD   50 mcg at 11/14/15 [phone]  . loperamide (IMODIUM) capsule 2 mg  2 mg Oral PRN Pollie Friar, PA-C   2 mg at 11/13/15 2333  . LORazepam (ATIVAN) injection 1-2 mg  1-2 mg  Intravenous Q2H PRN Norval Morton, MD      . multivitamin (RENA-VIT) tablet 1 tablet  1 tablet Oral QHS Mauricia Area, MD   1 tablet at 11/12/15 2215  . ondansetron (ZOFRAN) injection 4 mg  4 mg Intravenous Q6H PRN Norval Morton, MD   4 mg at 11/13/15 1809  . phenytoin (DILANTIN) injection 100 mg  100 mg Intravenous Q12H Cherene Altes, MD   100 mg at 11/14/15 0951  . RESOURCE THICKENUP CLEAR   Oral PRN Elmarie Shiley, MD        Allergies as of 11/08/2015 - Review Complete 11/08/2015  Allergen Reaction Noted  . Adhesive [tape] Hives 11/08/2015  . Salicylates Hives and Nausea Only 08/17/2015  . Asa [aspirin] Other (See Comments) 05/24/2015     Review of Systems:    Unable to obtain due to patient's mental status and lethargy   Physical Exam:  Vital signs in last 24 hours: Temp:  [97.4 F (36.3 C)-99.2 F (37.3 C)] 97.4 F (36.3 C) (06/02 1249) Pulse Rate:  [84-108] 85 (06/02 1249) Resp:  [16-33] 20 (06/02 1249) BP: (100-153)/(62-98) 124/77 mmHg (06/02 1249) SpO2:  [77 %-99 %] 99 % (06/02 1249) Weight:  [115 lb 15.4 oz (52.6 kg)] 115 lb 15.4 oz (52.6 kg) (06/02 0434) Last BM Date: 11/14/15 General: Thin and frail Caucasian female appears lethargic, but in NAD, Undergoing peritoneal dialysis Head:  Normocephalic and atraumatic. Eyes:   EOMI. No icterus. Conjunctiva pink. Ears:  Normal auditory acuity. Neck:   Supple Throat: Oral cavity and pharynx without inflammation or lesion Lungs: Respirations even and unlabored. Lungs clear to auscultation bilaterally.   No wheezes, crackles, or rhonchi.  Heart: Normal S1, S2. No MRG. Regular rate and rhythm. No peripheral edema, cyanosis or pallor.  Abdomen:  Soft, nondistended, nontender. No rebound or guarding. Normal bowel sounds. No appreciable masses or hepatomegaly. No abdominal distension. PD cath in place Rectal:  Not performed.  Msk:  Symmetrical without gross deformities. Peripheral pulses intact.  Extremities:  Without edema, no deformity or joint abnormality. Normal ROM, normal sensation. Neurologic:  Lethargic Skin:   Dry and intact without significant lesions or rashes. Psychiatric: Lethargic, unable to elicit accurate history    LAB RESULTS:  Recent Labs  11/12/15 0316  11/14/15 0501 11/14/15 0804 11/14/15 1140  WBC 7.8  --  15.2*  --   --   HGB 11.1*  < > 12.6 11.7* 12.6  HCT 37.1  < > 42.1 39.1 41.7  PLT 344  --  358  --   --   < > = values in this interval not displayed. BMET  Recent Labs  11/12/15 0316 11/13/15 0340 11/14/15 0501  NA 141 142 139  140  K 2.8* 3.7 3.7  3.7  CL 105 105 101  101  CO2 24 21* 25  23  GLUCOSE 147* 82 194*  187*  BUN 18 19 21*  21*  CREATININE 4.89* 4.81* 5.11*  4.93*  CALCIUM 8.6* 9.1 9.0  9.0   LFT  Recent Labs  11/14/15 0501  PROT 7.5  ALBUMIN 2.2*  2.1*  AST 19  ALT 10*  ALKPHOS 92  BILITOT 0.3   PT/INR No results for input(s): LABPROT, INR in the last 72 hours.  STUDIES: No results found.   PREVIOUS ENDOSCOPIES:            03/17/11-colo-no reports, just pathology positive for 3 tubular adenomas 11/17/11-colo-path + 3 tubular adenomas   Impression /  Plan:  Impression: 1. Self limited coffee ground emesis on 11/13/15 per report, after several episodes of vomiting, none in the past 12 hours, hgb stable at 12.6 currently. History of PUD per referring physician's  notes. Hemoccult positive stool yesterday. Pt denies melena. Consider PUD versus gastritis versus Mallory-Weiss tear versus other 2. Seizures/acute encephalopathy: Hospitalist following 3. Dysphagia: Patient on dysphagia 1 diet with nectar liquids 4. End-stage renal disease on peritoneal dialysis  Plan: 1. At this time, hemoglobin stable and no further episodes of hematemesis, no need for urgent endoscopic evaluation, certainly if further signs of acute bleeding, could consider EGD for further evaluation 2. Palliative care is seeing the patient this afternoon along with her sister, who is POA to determine plan of care, this will also help dictate further intervention in the future 3. Continue serial hemoglobins with transfusion as necessary 4. Will order twice a day PPI 5. Recommend clear liquid diet 6. Will discuss above with Dr. Fuller Plan, please await any further recommendations  Thank you for your kind consultation, we will continue to follow.  Lavone Nian Lemmon  11/14/2015, 1:24 PM     Attending physician's note   I have taken a history, examined the patient and reviewed the chart. I agree with the Advanced Practitioner's note, impression and recommendations. Self limited coffee ground emesis with FOBT positive stool and a stable Hb. Suspect minor bleeding from gastritis, esophagitis or ulcer. Prior EGD in 2011 and prior colonoscopy in 2013. Continue PPI bid for 1 month and then PPI qam long term. Minimize/avoid ASA/NSAID usage. Palliative care is consulting to help to establish goals of care given multiple comorbidities. No plans for EGD at this time. If she has recurrent GI bleeding and further intervention is desired in the context of the goals of care please reconsult GI. Outpatient GI follow up, if needed, with her Gastroenterologist in [place].   Lucio Edward, MD [facility] Regal [phone] Mon-Fri 8a-5p [phone] after 5p, weekends, holidays

## 2015-11-14 NOTE — Progress Notes (Signed)
Subjective: Interval History: has no complaint , does not answer questions except with shrug.  Objective: Vital signs in last 24 hours: Temp:  [97.8 F (36.6 C)-99.2 F (37.3 C)] 97.8 F (36.6 C) (06/02 0434) Pulse Rate:  [84-108] 92 (06/02 0546) Resp:  [16-33] 17 (06/02 0546) BP: (99-153)/(62-98) 130/77 mmHg (06/02 0434) SpO2:  [77 %-98 %] 98 % (06/02 0546) Weight:  [52.6 kg (115 lb 15.4 oz)] 52.6 kg (115 lb 15.4 oz) (06/02 0434) Weight change: -1.5 kg (-3 lb 4.9 oz)  Intake/Output from previous day: 06/01 0701 - 06/02 0700 In: 13001.5 [P.O.:720; I.V.:287.5] Out: 13541 [Stool:200] Intake/Output this shift:    General appearance: cachectic, pale and does not cooperate,  Resp: diminished breath sounds bilaterally and rales bibasilar Cardio: S1, S2 normal and systolic murmur: systolic ejection 2/6, decrescendo at 2nd left intercostal space GI: pos bs, soft, pos FW, PD cath L mid abdm Extremities: wasted  Lab Results:  Recent Labs  11/12/15 0316  11/13/15 2309 11/14/15 0501  WBC 7.8  --   --  15.2*  HGB 11.1*  < > 13.1 12.6  HCT 37.1  < > 43.5 42.1  PLT 344  --   --  358  < > = values in this interval not displayed. BMET:  Recent Labs  11/13/15 0340 11/14/15 0501  NA 142 139  140  K 3.7 3.7  3.7  CL 105 101  101  CO2 21* 25  23  GLUCOSE 82 194*  187*  BUN 19 21*  21*  CREATININE 4.81* 5.11*  4.93*  CALCIUM 9.1 9.0  9.0   No results for input(s): PTH in the last 72 hours. Iron Studies: No results for input(s): IRON, TIBC, TRANSFERRIN, FERRITIN in the last 72 hours.  Studies/Results: No results found.  I have reviewed the patient's current medications.  Assessment/Plan: 1 ESRD PD going well.  WBC ^,  Vol ok. Will use 1.5 as vol and bp ok 2 ^WBC ? Related to N, V, D per primary 3 Sz controlled 4 Confused no improvement.  Apparently at baseline can converse, not totally normal but coherent 5 FTT severe emaciation 6 N,V,D ?? Cause   7 ? Pneu 8  CVA P PD, goals of Care, not candidate for  outpateint HD at this time.  eval N, V,    LOS: 6 days   Korayma Hagwood L 11/14/2015,8:11 AM

## 2015-11-14 NOTE — Progress Notes (Signed)
Interval History:                                                                                                                      Sherri Davidson is an 60 y.o. female patient admitted with seizures. Patient is presently on Dilantin and Keppra, reportedly was very drowsy and the seizure medications were gradually weaned off by her regular primary care physician. Then she had breakthrough seizures off of Keppra. Her seizure medications were restarted at admission. She has remained seizure-free during the hospitalization. She continues to be on both Dilantin and Keppra, primary team is concerned about her ongoing sedation and difficult to participate in with ADLs and physical therapy. Patient unable to provide any further history. She denies any new neurological symptoms.      Past Medical History: Past Medical History  Diagnosis Date  . CVA, old, hemiparesis ([place])   . Hypertension   . Diabetes ([place])   . ESRD on peritoneal dialysis ([place])   . CHF (congestive heart failure) ([place])   . Hypothyroidism   . Seizures ([place])   . Stroke [facility])     Past Surgical History  Procedure Laterality Date  . Cholecystectomy    . Amputation toe    . Insertion of dialysis catheter      Family History: History reviewed. No pertinent family history.  Social History:   reports that she has quit smoking. Her smoking use included Cigarettes. She has a 30 pack-year smoking history. She does not have any smokeless tobacco history on file. She reports that she does not drink alcohol or use illicit drugs.  Allergies:  Allergies  Allergen Reactions  . Adhesive [Tape] Hives    Please use paper tape  . Salicylates Hives and Nausea Only    Uncoated.  Diona Fanti [Aspirin] Other (See Comments)    GI upset for non-enteric coated aspirin     Medications:                                                                                                                         Current facility-administered medications:   .  0.9 %  sodium chloride infusion, , Intravenous, Once, Allie Bossier, MD .  0.9 %  sodium chloride infusion, , Intravenous, Continuous, Allie Bossier, MD, Last Rate: 30 mL/hr at 11/14/15 1149, 30 mL/hr at 11/14/15 1149 .  [DISCONTINUED] acetaminophen (TYLENOL) tablet 650 mg, 650 mg, Oral, Q6H PRN **OR** acetaminophen (TYLENOL) suppository 650 mg,  650 mg, Rectal, Q6H PRN, Norval Morton, MD .  acetaminophen (TYLENOL) tablet 650 mg, 650 mg, Oral, Q6H PRN, Cherene Altes, MD .  albuterol (PROVENTIL) (2.5 MG/3ML) 0.083% nebulizer solution 2.5 mg, 2.5 mg, Nebulization, Q2H PRN, Norval Morton, MD .  Darbepoetin Alfa (ARANESP) injection 60 mcg, 60 mcg, Subcutaneous, Q Mon-1800, Elmarie Shiley, MD, 60 mcg at 11/10/15 1807 .  gentamicin cream (GARAMYCIN) 0.1 % 1 application, 1 application, Topical, Daily, Elmarie Shiley, MD, 1 application at 29/79/89 2121 .  insulin aspart (novoLOG) injection 0-15 Units, 0-15 Units, Subcutaneous, Q4H, Allie Bossier, MD, 2 Units at 11/14/15 [phone] .  levETIRAcetam (KEPPRA) tablet 500 mg, 500 mg, Oral, BID, Amarachukwu Lakatos Fuller Mandril, MD .  levothyroxine (SYNTHROID, LEVOTHROID) tablet 50 mcg, 50 mcg, Oral, QAC breakfast, Cherene Altes, MD, 50 mcg at 11/14/15 [phone] .  loperamide (IMODIUM) capsule 2 mg, 2 mg, Oral, PRN, Hewitt Shorts [place], PA-C, 2 mg at 11/13/15 2333 .  LORazepam (ATIVAN) injection 1-2 mg, 1-2 mg, Intravenous, Q2H PRN, Norval Morton, MD .  multivitamin (RENA-VIT) tablet 1 tablet, 1 tablet, Oral, QHS, Mauricia Area, MD, 1 tablet at 11/12/15 2215 .  [DISCONTINUED] ondansetron (ZOFRAN) tablet 4 mg, 4 mg, Oral, Q6H PRN **OR** ondansetron (ZOFRAN) injection 4 mg, 4 mg, Intravenous, Q6H PRN, Norval Morton, MD, 4 mg at 11/13/15 1809 .  pantoprazole (PROTONIX) EC tablet 40 mg, 40 mg, Oral, BID, Lavone Nian [place], [place] .  phenytoin (DILANTIN) ER capsule 100 mg, 100 mg, Oral, QHS, Devanta Daniel Fuller Mandril, MD .  RESOURCE THICKENUP CLEAR, , Oral, PRN, Elmarie Shiley,  MD   Neurologic Examination:                                                                                                     Today's Vitals   11/14/15 0546 11/14/15 0851 11/14/15 0921 11/14/15 1249  BP:  111/69  124/77  Pulse: 92 89  85  Temp:  98.3 F (36.8 C)  97.4 F (36.3 C)  TempSrc:  Oral  Oral  Resp: '17 19  20  '$ Height:      Weight:      SpO2: 98% 98%  99%  PainSc:   0-No pain     Drowsy, opens eyes to verbal commands, follow simple commands such as elevating all 4 extremities antigravity without drift. No abnormal involuntary movements noted , no gaze deviation or nystagmus noted.    Lab Results: Basic Metabolic Panel:  Recent Labs Lab 11/10/15 0227 11/11/15 0447 11/12/15 0316 11/13/15 0340 11/14/15 0501  NA 142 139 141 142 139  140  K 4.2 4.2 2.8* 3.7 3.7  3.7  CL 110 106 105 105 101  101  CO2 22 19* 24 21* 25  23  GLUCOSE 136* 136* 147* 82 194*  187*  BUN 30* 22* 18 19 21*  21*  CREATININE 5.51* 4.82* 4.89* 4.81* 5.11*  4.93*  CALCIUM 8.8* 8.5* 8.6* 9.1 9.0  9.0  MG  --   --  2.0  --  1.9  PHOS 6.5* 6.1*  --  6.1* 6.2*  6.1*    Liver Function Tests:  Recent Labs Lab 11/08/15 1559 11/10/15 0227 11/11/15 0447 11/13/15 0340 11/14/15 0501  AST 32  --   --   --  19  ALT 14  --   --   --  10*  ALKPHOS 98  --   --   --  92  BILITOT 0.3  --   --   --  0.3  PROT 7.1  --   --   --  7.5  ALBUMIN 2.1* 2.1* 2.1* 2.3* 2.2*  2.1*   No results for input(s): LIPASE, AMYLASE in the last 168 hours.  Recent Labs Lab 11/09/15 0635 11/14/15 0501  AMMONIA 17 20    CBC:  Recent Labs Lab 11/08/15 1559 11/09/15 0255 11/10/15 0227 11/11/15 0447 11/12/15 0316 11/13/15 2102 11/13/15 2309 11/14/15 0501 11/14/15 0804 11/14/15 1140  WBC 10.6* 8.4 7.3 7.9 7.8  --   --  15.2*  --   --   NEUTROABS 9.0*  --   --   --   --   --   --  11.9*  --   --   HGB 10.5* 9.5* 10.2* 11.1* 11.1* 12.3 13.1 12.6 11.7* 12.6  HCT 35.3* 31.4* 33.8* 36.5 37.1  40.7 43.5 42.1 39.1 41.7  MCV 86.5 86.3 86.4 86.5 85.9  --   --  87.9  --   --   PLT 386 356 371 342 344  --   --  358  --   --     Cardiac Enzymes: No results for input(s): CKTOTAL, CKMB, CKMBINDEX, TROPONINI in the last 168 hours.  Lipid Panel:  Recent Labs Lab 11/12/15 0315  CHOL 119  TRIG 85  HDL 60  CHOLHDL 2.0  VLDL 17  LDLCALC 42    CBG:  Recent Labs Lab 11/13/15 2012 11/14/15 0038 11/14/15 0433 11/14/15 0848 11/14/15 1248  GLUCAP 174* 231* 227* 131* 114*    Microbiology: Results for orders placed or performed during the hospital encounter of 11/08/15  MRSA PCR Screening     Status: Abnormal   Collection Time: 11/08/15  9:58 PM  Result Value Ref Range Status   MRSA by PCR POSITIVE (A) NEGATIVE Final    Comment:        The GeneXpert MRSA Assay (FDA approved for NASAL specimens only), is one component of a comprehensive MRSA colonization surveillance program. It is not intended to diagnose MRSA infection nor to guide or monitor treatment for MRSA infections. RESULT CALLED TO, READ BACK BY AND VERIFIED WITH: [name],[name] [phone] 11/09/15 MITCHELL,L   C difficile quick scan w PCR reflex     Status: None   Collection Time: 11/12/15 10:47 PM  Result Value Ref Range Status   C Diff antigen NEGATIVE NEGATIVE Final   C Diff toxin NEGATIVE NEGATIVE Final   C Diff interpretation Negative for toxigenic C. difficile  Final    Imaging: No results found.  Assessment and plan:   Sherri Davidson is an 60 y.o. female patient admitted with seizures. Patient is presently on Dilantin and Keppra, reportedly was very drowsy and the seizure medications were gradually weaned off by her regular primary care physician. Then she had breakthrough seizures off of Keppra. Her seizure medications were restarted at admission. She has remained seizure-free during the hospitalization. She continues to be on both Dilantin and Keppra, primary team is concerned about her ongoing sedation and  difficult to participate in with ADLs and physical  therapy. Patient unable to provide any further history. She denies any new neurological symptoms.   Grossly nonfocal exam. Based on history of her drowsiness, secondary to Dilantin in the past, as she's been stable with no seizures, ability his Dilantin dose to 100 mg at bedtime only, continue Keppra same dose 500 mg twice a day. Switched both of his medications from IV to by mouth as patient is able to take by mouth pills without difficulty at this time. She remains stable, she can be discharged on these doses. Follow-up with outpatient neurology for further monitoring and dose titration. Continue seizure precautions.  We'll follow-up PRN. Please call for any further questions

## 2015-11-14 NOTE — Progress Notes (Signed)
Daily Progress Note   Patient Name: Sherri Davidson       Date: 11/14/2015 DOB: 1956-05-06  Age: 60 y.o. MRN#: [id number] Attending Physician: Sherri Bossier, MD Primary Care Physician: [facility] Admit Date: 11/08/2015  Reason for Consultation/Follow-up: Establishing goals of care  Subjective:  Patient unable to participate in meeting due to encephalopathy.  Met with Sister and S/O.  Sister Sherri Davidson) is patient's HCPOA. Sherri Davidson had 2 strokes and an [place] in 2010.  Since then she has relied on her sister for care and assistance.  In 2011 the patient was started on peritoneal dialysis.  Her nephrologists:  Dr. Candiss Norse and Dr. Holley Raring (?) in [place] have advised that patient and Sherri Davidson that the patient's days of peritoneal dialysis are limited and that she is not a candidate for HD.    At baseline just prior to admission the patient was not eating very well.  She does like the D1 diet, but her weight seems to fluctuate with the use of peritoneal dialysis.  She could walk short distances with a walker or the aid of her sister.  Her sister frequently used a wheelchair to take her outside.   She has a hospital bed at home.  Her sister provides the PD.  Reportedly the patient quit smoking in December 2016 - but still smokes about 1 cigarette every one or two weeks.  Her sister says the patient really enjoys smoking.  The patient has 3 sons who do not visit often and are not involved in her health care.  All 3 live in [place].  In December 2016 that patient was placed on Dilantin and Keppra after having seizures (that started after a stroke).  She was on both for 7 weeks.  She had frequent hallucinations, altered mental status and falls.  In the 3rd week of January 2017 the Dilantin was  stopped.  Reportedly the patient's mental status cleared in 2 days.  She was left on Keppra and did well.  The Keppra was discontinued on May 24 and the patient had 3 seizures on May 27.  After admission she was restarted on dilantin and keppra.   Sherri Davidson insists that the patient's out patient Neurologist, Dr. Melrose Nakayama, says "Any amount of dilantin is too much for Sherri Davidson".  Sherri Davidson feels strongly that Dilantin  is causing her sister's altered mental status.  Sherri Davidson tells me that Sherri Davidson has a lung mass is felt to be cancer.  She had pneumonia in Dec, Jan, and Feb.  A CT was finally done that Sherri Davidson was told showed a mass.  In Palliative discussions with [facility] the patient indicated that she did not want the Mass worked up.  She wanted Quality of life over Quantity of life.    Dr. Sherral Hammers and I reviewed the chest CT.  It does not clearly show a mass.  Rather it shows "Oval area of dense consolidation in the posterior, inferior aspect of the right lower lobe." It also shows two very small lung nodules that have remained stable since 2011.    I have not discussed this with Sherri Davidson yet.  We discussed Sherri Davidson's bouts of hematemesis over night.  She has had a "bleeding ulcer" in the past (2011).  She takes protonix and Zantac PRN.  Her GI is in [place].  Last Egd/Colon was in 2011.  The patient occasionally has vomiting spells and will have coffee grounds if she vomits too many times.  We discussed code status.  Sherri Davidson was insistent her sister be full code.  Sherri Davidson relayed stories about multiple family members receiving CPR and having quality good time afterward.  Sherri Davidson states that when Sherri Davidson has no more fight left in her she will change her code status to DNR.    We discussed a PICC or Central line insertion - including risks of pneumothorax, bleeding, and infection.  Sherri Davidson consented and wants her sister to have a PICC or Central line if needed for access/monitoring or medications.  Length of Stay:  6  Current Medications: Scheduled Meds:  . sodium chloride   Intravenous Once  . darbepoetin (ARANESP) injection - NON-DIALYSIS  60 mcg Subcutaneous Q Mon-1800  . gentamicin cream  1 application Topical Daily  . insulin aspart  0-15 Units Subcutaneous Q4H  . levETIRAcetam  500 mg Intravenous Q24H  . levothyroxine  50 mcg Oral QAC breakfast  . multivitamin  1 tablet Oral QHS  . phenytoin (DILANTIN) IV  100 mg Intravenous Q12H    Continuous Infusions: . sodium chloride 30 mL/hr (11/14/15 1149)    PRN Meds: [DISCONTINUED] acetaminophen **OR** acetaminophen, acetaminophen, albuterol, loperamide, LORazepam, [DISCONTINUED] ondansetron **OR** ondansetron (ZOFRAN) IV, RESOURCE THICKENUP CLEAR  Physical Exam      Thin frail gray appearing female.  Appears older than stated age.  Patient wakes to touch Extremities.  Left hand is purple.   Able to move all 4 Speech is clear.  Patient is confused. CV RRR Resp: NAD  Vital Signs: BP 124/77 mmHg  Pulse 85  Temp(Src) 97.4 F (36.3 C) (Oral)  Resp 20  Ht 5' 6"  (1.676 m)  Wt 52.6 kg (115 lb 15.4 oz)  BMI 18.73 kg/m2  SpO2 99% SpO2: SpO2: 99 % O2 Device: O2 Device: Not Delivered O2 Flow Rate: O2 Flow Rate (L/min): 5 L/min  Intake/output summary:  Intake/Output Summary (Last 24 hours) at 11/14/15 1316 Last data filed at 11/14/15 0700  Gross per 24 hour  Intake  287.5 ml  Output    200 ml  Net   87.5 ml   LBM: Last BM Date: 11/14/15 Baseline Weight: Weight: 54.7 kg (120 lb 9.5 oz) Most recent weight: Weight: 52.6 kg (115 lb 15.4 oz)       Palliative Assessment/Data:    Flowsheet Rows        Most Recent Value  Intake Tab    Referral Department  Hospitalist   Unit at Time of Referral  Intermediate Care Unit   Palliative Care Primary Diagnosis  Neurology   Date Notified  11/12/15   Palliative Care Type  New Palliative care   Reason for referral  Clarify Goals of Care   Date of Admission  11/08/15   Date first seen by  Palliative Care  11/13/15   # of days Palliative referral response time  1 Day(s)   # of days IP prior to Palliative referral  4   Clinical Assessment    Palliative Performance Scale Score  30%   Psychosocial & Spiritual Assessment    Palliative Care Outcomes       Patient Active Problem List   Diagnosis Date Noted  . Controlled diabetes mellitus type 2 with complications ([place])   . Coffee ground emesis   . Palliative care encounter   . Goals of care, counseling/discussion   . 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 ESRD on peritoneal dialysis ([place])   . Hallucinations   . Dysphagia   . Acute encephalopathy 11/09/2015  . Hyperkalemia 11/09/2015  . UTI (lower urinary tract infection) 11/08/2015  . Seizures ([place]) 11/08/2015  . Vomiting 08/17/2015  . Seizure ([place]) 05/24/2015  . Community acquired pneumonia 05/24/2015  . Pressure ulcer 05/24/2015    Palliative Care Assessment & Plan   Assessment: Chronically ill patient on peritoneal dialysis, admitted with seizures and AMS.  Still encephalopathic.  Now with hematemesis.  Recommendations/Plan:  Protonix BID  Stop Dilantin.   I have called Neuro for their opinion before discontinuing dilantin.  The patient's desire for "Quality of life over Quantity of life" does not match her code status.  I will continue to work with the patient and her family to refine her [place].     Goals of Care and Additional Recommendations:  Limitations on Scope of Treatment: Full Scope Treatment  Code Status: FULL    Code Status Orders        Start     Ordered   11/08/15 2228  Full code   Continuous     11/08/15 2233    Code Status History    Date Active Date Inactive Code Status Order ID Comments User Context   08/17/2015  8:07 AM 08/18/2015  6:39 PM Full Code [id number]  Epifanio Lesches, MD ED   05/24/2015  3:42 AM 05/28/2015  5:34 PM Full Code [id number]  Saundra Shelling, MD Inpatient    Advance Directive Documentation        Most Recent Value    Type of Advance Directive  Healthcare Power of [place]   Pre-existing out of facility DNR order (yellow form or pink MOST form)     "MOST" Form in Place?         Prognosis:   Unable to determine  Discharge Planning:  Home with Palliative Services  Care plan was discussed with Domenick Bookbinder, [facility] MD, Neuro MD  Thank you for allowing the Palliative Medicine Team to assist in the care of this patient.   Time In: 1;00 Time Out: 2:00 Total Time 60 Prolonged Time Billed no      Greater than 50%  of this time was spent counseling and coordinating care related to the above assessment and plan.  Melton Alar, PA-C  Please contact Palliative Medicine Team phone at [phone] for questions and concerns.

## 2015-11-14 NOTE — Progress Notes (Addendum)
PROGRESS NOTE    Sherri Davidson  JFH:[id number] DOB: [birth year] DOA: 11/08/2015 PCP: [facility]   Brief Narrative:  60 y.o. WF PMHx CVA, HTN, CHF, hypothyroidism, DM2, and ESRD on peritoneal hemodialysis daily, gastric ulcer 2011 following EGD/colonoscopy performed in [place] by a [place].   who presented with seizures. Around 1pm the patient was noted to have her first seizure that lasted 5-6 minutes with generalized tonic-clonic movements. EMS was called and patient was able to return to baseline, at which time she refused transport to the hospital. Around 2:18 PM she had a second seizure with acute left-sided facial drooping for which EMS was again called. Patient was very lethargic and altered after her seizing stoped. In the ambulance the patient had a third seizure. Patient was recently taken off of [place] by her Neurologist (Dr. Melrose Nakayama at [facility]). Dilantin was stopped in 06/2015 after the patient had repeated falls. Her last prior seizure activity was 05/2015.  In the ED CT and MRI of the brain showed previous right sided CVA, but no acute abnormalities. UA was positive. UDS negative. Patient was evaluated by Neurology who recommended continuation of Keppra 500 mg daily and Dilantin 100 mg 3 times a day.    Assessment & Plan:   Principal Problem:   Seizures ([place]) Active Problems:   Pressure ulcer   UTI (lower urinary tract infection)   Acute encephalopathy   Hyperkalemia   Todd's paralysis ([name])   ESRD on peritoneal dialysis ([place])   Hallucinations   Dysphagia   Controlled diabetes mellitus type 2 with complications ([name])   Coffee ground emesis   Palliative care encounter   Goals of care, counseling/discussion    Seizures / Todd's paralysis / acute encephalopathy -Per her sister/caregiver/POA Sherri Davidson patient usually is A/O 4 at home. Also states patient's primary neurologist had taken patient off of Dilantin secondary to  increased lethargy. -Hallucinations have resolved.   -5/31 Dilantin level = 11.7  -Dilantin 100 mg BID per neurology recommendation -Continue Keppra 500 mg daily ADDENDUM 6/2; Sherri Davidson (sister) met with Palliative Care PA Melton Alar and insisted that patient symptoms secondary to her being on Dilantin. Neurology contacted requesting Dilantin be DC'd. Will await their recommendation  CVA -CT and MRI showed multiple old infarcts. Sister aware  Hallucinations -Will continue all medications per neurology but would not add any further psychogenic medication -6/1 resolved   Dysphagia -5/30 Passed swallow evaluation; dysphagia 1 nectar thick liquid    Possible Urinary tract infection -Urine is abnormal, but unclear how often pt urinates/if she makes regular urine  -No urine culture pending -Completed 3 days of empiric antibiotics   End-stage renal disease on peritoneal dialysis -PD per Nephrology  -Spoke at length with [facility] nephrology, who is going to speak with her regular nephrologist. Unsure patient will be able to continue PD at home, and unfortunately ability to place patient in SNF which would provide PD slim. Patient also not good candidate for HD. Will await further recommendations -Patient's sister/caregiver/POA Sherri Davidson per nursing staff has departed on vacation. Will attempt to contact via phone, to discuss plan of care. -Strict in and out since admission -3.5 L -Daily weight  Filed Weights   11/12/15 0444 11/13/15 0413 11/14/15 0434  Weight: 57 kg (125 lb 10.6 oz) 54.1 kg (119 lb 4.3 oz) 52.6 kg (115 lb 15.4 oz)      DM type 2 controlled with complication -9/37 Hemoglobin A1c= 5.6 -Lipid panel; within  ADA guideline -Moderate SSI  Hyperkalemia -Resolved w/ resumption of PD  Pressure ulcer v/s MASD See WOC note for wound info and tx recs   Coffee-ground emesis/Hx Gastric ulcer -6/1 Paged at [facility] that patient has had several episodes  of coffee-ground emesis just now. -Discontinue heparin products -NPO -6/1 @0041  Gastroccult card. Positive -Gastroccult card following coffee-ground emesis at 1830 on 6/1 pending -6/2 Protonix drip DC'd secondary to hemoglobin being stable overnight -Type and cross -Normal saline 46m/hr; monitor closely for fluid overload secondary to renal failure  Recent Labs Lab 11/13/15 2102 11/13/15 2309 11/14/15 0501 11/14/15 0804 11/14/15 1140  HGB 12.3 13.1 12.6 11.7* 12.6  -Transfuse for hemoglobin<7 -6/2 Have consulted [facility]  RLL lung mass vs Atelectasis vs Pneumonia -Per Sherri Davidson sister did not want any further workup. -Per new 2017 Fleischner society guidelines for Solitary nodule size: >8 mm, in either low or high risk patients Follow-up CT at 3 months and/or CT-PET, and/or biopsy. Patient would require new chest CT on/near July 11      Goals of care -Will attempt to contact patient's sister/caregiver/POA Sherri Davidson who is on vacation I/O discuss when we can have meeting to discuss short-term/long-term goals care -6/2 PA MMonroewill meet with sister/caregiver/POA Sherri Davidson to discuss short-term/long-term goals of care and CODE STATUS.    DVT prophylaxis: Subcutaneous heparin Code Status: Full Family Communication: sister/caregiver/POA Sherri Stofer  Disposition Plan: SNF(Unlikely given patient on PD)   Consultants:  Dr.James Deterding [facility]  Neurology  GI pending   Procedures/Significant Events:  4/11 CT chest WO contrast;-Oval area of dense consolidation in the posterior, inferior RLL to my: Rounded PNA vs rounded atelectasis vs underlying mass - Small to moderate Rt pleural effusion. -Stable 3 mm benign subpleural nodule LUL -Large calcified disc herniation at the T9-10 level, causing moderate canal stenosis and probable cord compression on the right. 5/27 CT head WO contrast;-Changes of prior infarcts are  noted in the distribution of the right middle cerebral artery. -Lacunar infarct again identified on the right in the basal ganglia. No acute hemorrhage, acute infarction or space-occupying mass lesion.  5/27 MRA head;-Findings suspicious for prominent intracranial atherosclerotic changes including significant stenosis left internal carotid artery cavernous segment, significant posterior cerebral artery narrowing greater on the left and decrease number of visualized right middle cerebral artery branch vessels C/W remote right hemispheric infarct. 5/27 MRI brain;-Remote large right hemispheric infarct with encephalomalacia right frontal lobe, parietal lobe and temporal lobe with subsequent dilation right temporal horn. -Remote infarct right corona radiata/lenticular nucleus. Remote small infarct left caudate.Remote small infarct left thalamus.    Cultures 5/27 MRSA by PCR positive   Antimicrobials: Ceftriaxone 5/27 > 5/30   Devices  LINES / TUBES:      Continuous Infusions: . sodium chloride 30 mL/hr (11/14/15 1149)     Subjective:  6/2  A/O  3  (does not  no when ).  Patient Sitting in bed comfortably.      Objective: Filed Vitals:   11/14/15 0200 11/14/15 0434 11/14/15 0546 11/14/15 0851  BP: 134/62 130/77  111/69  Pulse: 90 91 92 89  Temp:  97.8 F (36.6 C)  98.3 F (36.8 C)  TempSrc:  Oral  Oral  Resp: 21 21 17 19   Height:      Weight:  52.6 kg (115 lb 15.4 oz)    SpO2:  94% 98% 98%    Intake/Output Summary (Last 24 hours) at 11/14/15 1237 Last data  filed at 11/14/15 0700  Gross per 24 hour  Intake  527.5 ml  Output    200 ml  Net  327.5 ml   Filed Weights   11/12/15 0444 11/13/15 0413 11/14/15 0434  Weight: 57 kg (125 lb 10.6 oz) 54.1 kg (119 lb 4.3 oz) 52.6 kg (115 lb 15.4 oz)    Examination: General: A/O  3  (does not  no when ), follows commands, cachectic No acute respiratory distress Eyes: negative scleral hemorrhage, negative anisocoria,  negative icterus ENT: Negative Runny nose, negative gingival bleeding, Neck:  Negative scars, masses, torticollis, lymphadenopathy, JVD Lungs: Clear to auscultation bilaterally without wheezes or crackles Cardiovascular: Regular rate and rhythm without murmur gallop or rub normal S1 and S2 Abdomen: negative abdominal pain, nondistended, positive soft, bowel sounds, no rebound, no ascites, no appreciable mass Extremities: No significant cyanosis, clubbing, or edema bilateral lower extremities Skin: Negative rashes, lesions, ulcers Psychiatric:  Positive hallucinations (seen people in the room were not there)  Central nervous system:  Moves all extremities, follows some commands,  negative dysarthria, negative expressive aphasia, negative receptive aphasia.  .     Data Reviewed: Care during the described time interval was provided by me .  I have reviewed this patient's available data, including medical history, events of note, physical examination, and all test results as part of my evaluation. I have personally reviewed and interpreted all radiology studies.  CBC:  Recent Labs Lab 11/08/15 1559 11/09/15 0255 11/10/15 0227 11/11/15 6294 11/12/15 0316 11/13/15 2102 11/13/15 2309 11/14/15 0501 11/14/15 0804 11/14/15 1140  WBC 10.6* 8.4 7.3 7.9 7.8  --   --  15.2*  --   --   NEUTROABS 9.0*  --   --   --   --   --   --  11.9*  --   --   HGB 10.5* 9.5* 10.2* 11.1* 11.1* 12.3 13.1 12.6 11.7* 12.6  HCT 35.3* 31.4* 33.8* 36.5 37.1 40.7 43.5 42.1 39.1 41.7  MCV 86.5 86.3 86.4 86.5 85.9  --   --  87.9  --   --   PLT 386 356 371 342 344  --   --  358  --   --    Basic Metabolic Panel:  Recent Labs Lab 11/10/15 0227 11/11/15 0447 11/12/15 0316 11/13/15 0340 11/14/15 0501  NA 142 139 141 142 139  140  K 4.2 4.2 2.8* 3.7 3.7  3.7  CL 110 106 105 105 101  101  CO2 22 19* 24 21* 25  23  GLUCOSE 136* 136* 147* 82 194*  187*  BUN 30* 22* 18 19 21*  21*  CREATININE 5.51* 4.82*  4.89* 4.81* 5.11*  4.93*  CALCIUM 8.8* 8.5* 8.6* 9.1 9.0  9.0  MG  --   --  2.0  --  1.9  PHOS 6.5* 6.1*  --  6.1* 6.2*  6.1*   GFR: Estimated Creatinine Clearance: 10.2 mL/min (by C-G formula based on Cr of 4.93). Liver Function Tests:  Recent Labs Lab 11/08/15 1559 11/10/15 0227 11/11/15 0447 11/13/15 0340 11/14/15 0501  AST 32  --   --   --  19  ALT 14  --   --   --  10*  ALKPHOS 98  --   --   --  92  BILITOT 0.3  --   --   --  0.3  PROT 7.1  --   --   --  7.5  ALBUMIN 2.1* 2.1* 2.1*  2.3* 2.2*  2.1*   No results for input(s): LIPASE, AMYLASE in the last 168 hours.  Recent Labs Lab 11/09/15 0635 11/14/15 0501  AMMONIA 17 20   Coagulation Profile:  Recent Labs Lab 11/08/15 1559  INR 1.06   Cardiac Enzymes: No results for input(s): CKTOTAL, CKMB, CKMBINDEX, TROPONINI in the last 168 hours. BNP (last 3 results) No results for input(s): PROBNP in the last 8760 hours. HbA1C:  Recent Labs  11/12/15 0320  HGBA1C 5.6   CBG:  Recent Labs Lab 11/13/15 1753 11/13/15 2012 11/14/15 0038 11/14/15 0433 11/14/15 0848  GLUCAP 81 174* 231* 227* 131*   Lipid Profile:  Recent Labs  11/12/15 0315  CHOL 119  HDL 60  LDLCALC 42  TRIG 85  CHOLHDL 2.0   Thyroid Function Tests: No results for input(s): TSH, T4TOTAL, FREET4, T3FREE, THYROIDAB in the last 72 hours. Anemia Panel:  Recent Labs  11/13/15 0340  VITAMINB12 954*  FOLATE 13.8   Urine analysis:    Component Value Date/Time   COLORURINE YELLOW 11/08/2015 1707   COLORURINE Yellow 05/30/2014 1618   APPEARANCEUR TURBID* 11/08/2015 1707   APPEARANCEUR Clear 05/30/2014 1618   LABSPEC 1.018 11/08/2015 1707   LABSPEC 1.009 05/30/2014 1618   PHURINE 7.5 11/08/2015 1707   PHURINE 7.0 05/30/2014 1618   GLUCOSEU 100* 11/08/2015 1707   GLUCOSEU >=500 05/30/2014 1618   HGBUR LARGE* 11/08/2015 1707   HGBUR Negative 05/30/2014 1618   BILIRUBINUR NEGATIVE 11/08/2015 1707   BILIRUBINUR Negative  05/30/2014 1618   KETONESUR NEGATIVE 11/08/2015 1707   KETONESUR Negative 05/30/2014 1618   PROTEINUR >300* 11/08/2015 1707   PROTEINUR 100 mg/dL 05/30/2014 1618   NITRITE NEGATIVE 11/08/2015 1707   NITRITE Negative 05/30/2014 1618   LEUKOCYTESUR LARGE* 11/08/2015 1707   LEUKOCYTESUR Negative 05/30/2014 1618   Sepsis Labs: @LABRCNTIP (procalcitonin:4,lacticidven:4)  ) Recent Results (from the past 240 hour(s))  MRSA PCR Screening     Status: Abnormal   Collection Time: 11/08/15  9:58 PM  Result Value Ref Range Status   MRSA by PCR POSITIVE (A) NEGATIVE Final    Comment:        The GeneXpert MRSA Assay (FDA approved for NASAL specimens only), is one component of a comprehensive MRSA colonization surveillance program. It is not intended to diagnose MRSA infection nor to guide or monitor treatment for MRSA infections. RESULT CALLED TO, READ BACK BY AND VERIFIED WITH: [name],[name] [phone] 11/09/15 MITCHELL,L   C difficile quick scan w PCR reflex     Status: None   Collection Time: 11/12/15 10:47 PM  Result Value Ref Range Status   C Diff antigen NEGATIVE NEGATIVE Final   C Diff toxin NEGATIVE NEGATIVE Final   C Diff interpretation Negative for toxigenic C. difficile  Final         Radiology Studies: No results found.      Scheduled Meds: . sodium chloride   Intravenous Once  . darbepoetin (ARANESP) injection - NON-DIALYSIS  60 mcg Subcutaneous Q Mon-1800  . gentamicin cream  1 application Topical Daily  . insulin aspart  0-15 Units Subcutaneous Q4H  . levETIRAcetam  500 mg Intravenous Q24H  . levothyroxine  50 mcg Oral QAC breakfast  . multivitamin  1 tablet Oral QHS  . phenytoin (DILANTIN) IV  100 mg Intravenous Q12H   Continuous Infusions: . sodium chloride 30 mL/hr (11/14/15 1149)     LOS: 6 days    Time spent: 40 minutes    Tamara Monteith, Geraldo Docker, MD [facility] Pager [phone]   If 7PM-7AM, please contact night-coverage [email or web address] Password  [facility] 11/14/2015, 12:37 PM

## 2015-11-15 LAB — COMPREHENSIVE METABOLIC PANEL
ALK PHOS: 83 U/L (ref 38–126)
ALT: 8 U/L — AB (ref 14–54)
ANION GAP: 13 (ref 5–15)
AST: 18 U/L (ref 15–41)
Albumin: 1.9 g/dL — ABNORMAL LOW (ref 3.5–5.0)
BILIRUBIN TOTAL: 0.2 mg/dL — AB (ref 0.3–1.2)
BUN: 23 mg/dL — ABNORMAL HIGH (ref 6–20)
CO2: 27 mmol/L (ref 22–32)
CREATININE: 5.28 mg/dL — AB (ref 0.44–1.00)
Calcium: 9 mg/dL (ref 8.9–10.3)
Chloride: 99 mmol/L — ABNORMAL LOW (ref 101–111)
GFR, EST AFRICAN AMERICAN: 9 mL/min — AB (ref >60)
GFR, EST NON AFRICAN AMERICAN: 8 mL/min — AB (ref >60)
Glucose, Bld: 75 mg/dL (ref 65–99)
Potassium: 3.6 mmol/L (ref 3.5–5.1)
Sodium: 139 mmol/L (ref 135–145)
TOTAL PROTEIN: 6.8 g/dL (ref 6.5–8.1)

## 2015-11-15 LAB — GLUCOSE, CAPILLARY
GLUCOSE-CAPILLARY: 136 mg/dL — AB (ref 65–99)
Glucose-Capillary: 104 mg/dL — ABNORMAL HIGH (ref 65–99)
Glucose-Capillary: 105 mg/dL — ABNORMAL HIGH (ref 65–99)
Glucose-Capillary: 105 mg/dL — ABNORMAL HIGH (ref 65–99)
Glucose-Capillary: 155 mg/dL — ABNORMAL HIGH (ref 65–99)
Glucose-Capillary: 83 mg/dL (ref 65–99)

## 2015-11-15 LAB — CBC WITH DIFFERENTIAL/PLATELET
BASOS ABS: 0 10*3/uL (ref 0.0–0.1)
BASOS PCT: 0 %
EOS ABS: 0.1 10*3/uL (ref 0.0–0.7)
Eosinophils Relative: 1 %
HCT: 38.7 % (ref 36.0–46.0)
HEMOGLOBIN: 11.4 g/dL — AB (ref 12.0–15.0)
Lymphocytes Relative: 19 %
Lymphs Abs: 2.9 10*3/uL (ref 0.7–4.0)
MCH: 26 pg (ref 26.0–34.0)
MCHC: 29.5 g/dL — ABNORMAL LOW (ref 30.0–36.0)
MCV: 88.4 fL (ref 78.0–100.0)
Monocytes Absolute: 1.4 10*3/uL — ABNORMAL HIGH (ref 0.1–1.0)
Monocytes Relative: 9 %
NEUTROS PCT: 71 %
Neutro Abs: 10.5 10*3/uL — ABNORMAL HIGH (ref 1.7–7.7)
PLATELETS: 330 10*3/uL (ref 150–400)
RBC: 4.38 MIL/uL (ref 3.87–5.11)
RDW: 20.5 % — ABNORMAL HIGH (ref 11.5–15.5)
WBC: 14.8 10*3/uL — AB (ref 4.0–10.5)

## 2015-11-15 LAB — PHOSPHORUS: PHOSPHORUS: 6.2 mg/dL — AB (ref 2.5–4.6)

## 2015-11-15 LAB — MAGNESIUM: MAGNESIUM: 1.8 mg/dL (ref 1.7–2.4)

## 2015-11-15 NOTE — Progress Notes (Signed)
Subjective: Interval History: has no complaint , not sure when it is or where she is.  Objective: Vital signs in last 24 hours: Temp:  [97.4 F (36.3 C)-98.3 F (36.8 C)] 98.1 F (36.7 C) (06/03 0720) Pulse Rate:  [84-99] 89 (06/03 0400) Resp:  [16-22] 16 (06/03 0400) BP: (102-148)/(57-91) 148/81 mmHg (06/03 0720) SpO2:  [92 %-100 %] 95 % (06/03 0720) Weight:  [53.1 kg (117 lb 1 oz)] 53.1 kg (117 lb 1 oz) (06/03 0400) Weight change: 0.5 kg (1 lb 1.6 oz)  Intake/Output from previous day: 06/02 0701 - 06/03 0700 In: 12468.5 [P.O.:80; I.V.:395.5] Out: 11569  Intake/Output this shift: Total I/O In: 430 [P.O.:240; I.V.:190] Out: -   General appearance: O to person, in hosp in [place], not sure name, thinks it is Wed Resp: diminished breath sounds bilaterally Cardio: S1, S2 normal and systolic murmur: holosystolic 2/6, blowing at apex GI: pos bs, pos FW, PD cath L mid abdm,  Extremities: emaciated  Lab Results:  Recent Labs  11/14/15 0501  11/14/15 1140 11/15/15 0240  WBC 15.2*  --   --  14.8*  HGB 12.6  < > 12.6 11.4*  HCT 42.1  < > 41.7 38.7  PLT 358  --   --  330  < > = values in this interval not displayed. BMET:  Recent Labs  11/14/15 0501 11/15/15 0240  NA 139  140 139  K 3.7  3.7 3.6  CL 101  101 99*  CO2 '25  23 27  '$ GLUCOSE 194*  187* 75  BUN 21*  21* 23*  CREATININE 5.11*  4.93* 5.28*  CALCIUM 9.0  9.0 9.0   No results for input(s): PTH in the last 72 hours. Iron Studies: No results for input(s): IRON, TIBC, TRANSFERRIN, FERRITIN in the last 72 hours.  Studies/Results: No results found.  I have reviewed the patient's current medications.  Assessment/Plan: 1 ESRD PD going well using 1.5% 2 Anemia stable 3 HPTH 4 SZ controlled.  Checking Free DPH 5 Cachexia 6 confusion P PD, 1.5%,  Free DPH    LOS: 7 days   Jaidyn Usery L 11/15/2015,8:35 AM

## 2015-11-15 NOTE — Progress Notes (Signed)
PROGRESS NOTE    Sherri Davidson  XTG:[id number] DOB: May 10, 1956 DOA: 11/08/2015 PCP: [facility]   Brief Narrative:  60 y.o. WF PMHx CVA, HTN, CHF, hypothyroidism, DM2, and ESRD on peritoneal hemodialysis daily, gastric ulcer 2011 following EGD/colonoscopy performed in [place] by a [place].   who presented with seizures. Around 1pm the patient was noted to have her first seizure that lasted 5-6 minutes with generalized tonic-clonic movements. EMS was called and patient was able to return to baseline, at which time she refused transport to the hospital. Around 2:18 PM she had a second seizure with acute left-sided facial drooping for which EMS was again called. Patient was very lethargic and altered after her seizing stoped. In the ambulance the patient had a third seizure. Patient was recently taken off of [place] by her Neurologist (Dr. Melrose Nakayama at [facility]). Dilantin was stopped in 06/2015 after the patient had repeated falls. Her last prior seizure activity was 05/2015.  In the ED CT and MRI of the brain showed previous right sided CVA, but no acute abnormalities. UA was positive. UDS negative. Patient was evaluated by Neurology who recommended continuation of Keppra 500 mg daily and Dilantin 100 mg 3 times a day.    Assessment & Plan:   Principal Problem:   Seizures ([place]) Active Problems:   Pressure ulcer   UTI (lower urinary tract infection)   Acute encephalopathy   Hyperkalemia   Todd's paralysis ([name])   ESRD on peritoneal dialysis ([place])   Hallucinations   Dysphagia   Controlled diabetes mellitus type 2 with complications ([name])   Coffee ground emesis   Palliative care encounter   Goals of care, counseling/discussion    Seizures / Todd's paralysis / acute encephalopathy -Per her sister/caregiver/POA [name] patient usually is A/O 4 at home. Also states patient's primary neurologist had taken patient off of Dilantin secondary to  increased lethargy. -Hallucinations have resolved.   -5/31 Dilantin level = 11.7  -Decreased Dilantin 100 mg daily per neurology recommendation -Continue Keppra 500 mg daily -Ambulate patient in the hall q shift   CVA -CT and MRI showed multiple old infarcts. Sister aware  Hallucinations -Will continue all medications per neurology but would not add any further psychogenic medication -6/1 resolved   Dysphagia -5/30 Passed swallow evaluation; dysphagia 1 nectar thick liquid    Possible Urinary tract infection -Urine is abnormal, but unclear how often pt urinates/if she makes regular urine  -Completed 3 days of empiric antibiotics  -In light of increasing leukocytosis obtain blood culture, urine culture   End-stage renal disease on peritoneal dialysis -PD per Nephrology  -Spoke at length with [facility] nephrology, who is going to speak with her regular nephrologist. Unsure patient will be able to continue PD at home, and unfortunately ability to place patient in SNF which would provide PD slim. Patient also not good candidate for HD. Will await further recommendations -Patient's sister/caregiver/POA [name] per nursing staff has departed on vacation. Will attempt to contact via phone, to discuss plan of care. -Strict in and out since admission -3.5 L -Daily weight  Filed Weights   11/13/15 0413 11/14/15 0434 11/15/15 0400  Weight: 54.1 kg (119 lb 4.3 oz) 52.6 kg (115 lb 15.4 oz) 53.1 kg (117 lb 1 oz)      DM type 2 controlled with complication -2/70 Hemoglobin A1c= 5.6 -Lipid panel; within ADA guideline -Moderate SSI  Hyperkalemia -Resolved w/ resumption of PD  Pressure ulcer v/s MASD See WOC  note for wound info and tx recs   Coffee-ground emesis/Hx Gastric ulcer -6/1 Paged at [facility] that patient has had several episodes of coffee-ground emesis just now. -Discontinue heparin products -NPO -6/1 @0041  Gastroccult card. Positive -Gastroccult card  following coffee-ground emesis at 1830 on 6/1 pending -Normal saline 4m/hr; monitor closely for fluid overload secondary to renal failure Recent Labs Lab 11/13/15 2309 11/14/15 0501 11/14/15 0804 11/14/15 1140 11/15/15 0240  HGB 13.1 12.6 11.7* 12.6 11.4*  -Transfuse for hemoglobin<7 -6/2 consulted [facility]; would not perform EGD at this time secondary to patient's multiple medical problems and stable H/H -  RLL lung mass vs Atelectasis vs Pneumonia -Per [name] sister did not want any further workup. -Per new 2017 Fleischner society guidelines for Solitary nodule size: >8 mm, in either low or high risk patients Follow-up CT at 3 months and/or CT-PET, and/or biopsy. Patient would require new chest CT on/near July 11      Goals of care -Will attempt to contact patient's sister/caregiver/POA [name] who is on vacation I/O discuss when we can have meeting to discuss short-term/long-term goals care -6/2; [name] (sister) met with Palliative Care PA MMelton Alarand insisted that patient symptoms secondary to her being on Dilantin. Neurology contacted requesting Dilantin be DC'd. Will await their recommendation .    DVT prophylaxis: Subcutaneous heparin Code Status: Full Family Communication: sister/caregiver/POA [name]  Disposition Plan: SNF(Unlikely given patient on PD)   Consultants:  Dr.James Deter[facility]  Neurology  Dr.Malcolm T [facility]    Procedures/Significant Events:  4/11 CT chest WO contrast;-Oval area of dense consolidation in the posterior, inferior RLL to my: Rounded PNA vs rounded atelectasis vs underlying mass - Small to moderate Rt pleural effusion. -Stable 3 mm benign subpleural nodule LUL -Large calcified disc herniation at the T9-10 level, causing moderate canal stenosis and probable cord compression on the right. 5/27 CT head WO contrast;-Changes of prior infarcts are noted in the distribution of the  right middle cerebral artery. -Lacunar infarct again identified on the right in the basal ganglia. No acute hemorrhage, acute infarction or space-occupying mass lesion.  5/27 MRA head;-Findings suspicious for prominent intracranial atherosclerotic changes including significant stenosis left internal carotid artery cavernous segment, significant posterior cerebral artery narrowing greater on the left and decrease number of visualized right middle cerebral artery branch vessels C/W remote right hemispheric infarct. 5/27 MRI brain;-Remote large right hemispheric infarct with encephalomalacia right frontal lobe, parietal lobe and temporal lobe with subsequent dilation right temporal horn. -Remote infarct right corona radiata/lenticular nucleus. Remote small infarct left caudate.Remote small infarct left thalamus.    Cultures 5/27 MRSA by PCR positive 6/3 urine pending 6/3 blood pending  Antimicrobials: Ceftriaxone 5/27 > 5/30   Devices  LINES / TUBES:      Continuous Infusions: . sodium chloride 30 mL/hr (11/14/15 1149)     Subjective:  6/3  A/O  2  (does not  no when, Where but knows she is in the hospital ).  Patient Sitting in bed comfortably.      Objective: Filed Vitals:   11/15/15 0020 11/15/15 0400 11/15/15 0720 11/15/15 1131  BP: 129/81 125/74 148/81 120/78  Pulse: 90 89  90  Temp: 97.5 F (36.4 C) 98.1 F (36.7 C) 98.1 F (36.7 C) 98.2 F (36.8 C)  TempSrc: Axillary Oral Oral Oral  Resp: 22 16  16   Height:      Weight:  53.1 kg (117 lb 1 oz)  SpO2: 92% 93% 95% 99%    Intake/Output Summary (Last 24 hours) at 11/15/15 1311 Last data filed at 11/15/15 0720  Gross per 24 hour  Intake 12898.5 ml  Output  11569 ml  Net 1329.5 ml   Filed Weights   11/13/15 0413 11/14/15 0434 11/15/15 0400  Weight: 54.1 kg (119 lb 4.3 oz) 52.6 kg (115 lb 15.4 oz) 53.1 kg (117 lb 1 oz)    Examination: General: A/O  3  (does not  no when ), follows commands, cachectic  No acute respiratory distress Eyes: negative scleral hemorrhage, negative anisocoria, negative icterus ENT: Negative Runny nose, negative gingival bleeding, Neck:  Negative scars, masses, torticollis, lymphadenopathy, JVD Lungs: Clear to auscultation bilaterally without wheezes or crackles Cardiovascular: Regular rate and rhythm without murmur gallop or rub normal S1 and S2 Abdomen: negative abdominal pain, nondistended, positive soft, bowel sounds, no rebound, no ascites, no appreciable mass Extremities: No significant cyanosis, clubbing, or edema bilateral lower extremities Skin: Negative rashes, lesions, ulcers Psychiatric:  Positive hallucinations (seen people in the room were not there)  Central nervous system:  Moves all extremities, follows some commands,  negative dysarthria, negative expressive aphasia, negative receptive aphasia.  .     Data Reviewed: Care during the described time interval was provided by me .  I have reviewed this patient's available data, including medical history, events of note, physical examination, and all test results as part of my evaluation. I have personally reviewed and interpreted all radiology studies.  CBC:  Recent Labs Lab 11/08/15 1559  11/10/15 0227 11/11/15 0447 11/12/15 0316  11/13/15 2309 11/14/15 0501 11/14/15 0804 11/14/15 1140 11/15/15 0240  WBC 10.6*  < > 7.3 7.9 7.8  --   --  15.2*  --   --  14.8*  NEUTROABS 9.0*  --   --   --   --   --   --  11.9*  --   --  10.5*  HGB 10.5*  < > 10.2* 11.1* 11.1*  < > 13.1 12.6 11.7* 12.6 11.4*  HCT 35.3*  < > 33.8* 36.5 37.1  < > 43.5 42.1 39.1 41.7 38.7  MCV 86.5  < > 86.4 86.5 85.9  --   --  87.9  --   --  88.4  PLT 386  < > 371 342 344  --   --  358  --   --  330  < > = values in this interval not displayed. Basic Metabolic Panel:  Recent Labs Lab 11/10/15 0227 11/11/15 0447 11/12/15 0316 11/13/15 0340 11/14/15 0501 11/15/15 0240  NA 142 139 141 142 139  140 139  K 4.2 4.2  2.8* 3.7 3.7  3.7 3.6  CL 110 106 105 105 101  101 99*  CO2 22 19* 24 21* 25  23 27   GLUCOSE 136* 136* 147* 82 194*  187* 75  BUN 30* 22* 18 19 21*  21* 23*  CREATININE 5.51* 4.82* 4.89* 4.81* 5.11*  4.93* 5.28*  CALCIUM 8.8* 8.5* 8.6* 9.1 9.0  9.0 9.0  MG  --   --  2.0  --  1.9 1.8  PHOS 6.5* 6.1*  --  6.1* 6.2*  6.1* 6.2*   GFR: Estimated Creatinine Clearance: 9.6 mL/min (by C-G formula based on Cr of 5.28). Liver Function Tests:  Recent Labs Lab 11/08/15 1559 11/10/15 0227 11/11/15 0447 11/13/15 0340 11/14/15 0501 11/15/15 0240  AST 32  --   --   --  19 18  ALT 14  --   --   --  10* 8*  ALKPHOS 98  --   --   --  92 83  BILITOT 0.3  --   --   --  0.3 0.2*  PROT 7.1  --   --   --  7.5 6.8  ALBUMIN 2.1* 2.1* 2.1* 2.3* 2.2*  2.1* 1.9*   No results for input(s): LIPASE, AMYLASE in the last 168 hours.  Recent Labs Lab 11/09/15 0635 11/14/15 0501  AMMONIA 17 20   Coagulation Profile:  Recent Labs Lab 11/08/15 1559  INR 1.06   Cardiac Enzymes: No results for input(s): CKTOTAL, CKMB, CKMBINDEX, TROPONINI in the last 168 hours. BNP (last 3 results) No results for input(s): PROBNP in the last 8760 hours. HbA1C: No results for input(s): HGBA1C in the last 72 hours. CBG:  Recent Labs Lab 11/14/15 2021 11/15/15 0032 11/15/15 0432 11/15/15 0729 11/15/15 1237  GLUCAP 168* 104* 83 105* 155*   Lipid Profile: No results for input(s): CHOL, HDL, LDLCALC, TRIG, CHOLHDL, LDLDIRECT in the last 72 hours. Thyroid Function Tests: No results for input(s): TSH, T4TOTAL, FREET4, T3FREE, THYROIDAB in the last 72 hours. Anemia Panel:  Recent Labs  11/13/15 0340  VITAMINB12 954*  FOLATE 13.8   Urine analysis:    Component Value Date/Time   COLORURINE YELLOW 11/08/2015 1707   COLORURINE Yellow 05/30/2014 1618   APPEARANCEUR TURBID* 11/08/2015 1707   APPEARANCEUR Clear 05/30/2014 1618   LABSPEC 1.018 11/08/2015 1707   LABSPEC 1.009 05/30/2014 1618   PHURINE  7.5 11/08/2015 1707   PHURINE 7.0 05/30/2014 1618   GLUCOSEU 100* 11/08/2015 1707   GLUCOSEU >=500 05/30/2014 1618   HGBUR LARGE* 11/08/2015 1707   HGBUR Negative 05/30/2014 1618   BILIRUBINUR NEGATIVE 11/08/2015 1707   BILIRUBINUR Negative 05/30/2014 1618   KETONESUR NEGATIVE 11/08/2015 1707   KETONESUR Negative 05/30/2014 1618   PROTEINUR >300* 11/08/2015 1707   PROTEINUR 100 mg/dL 05/30/2014 1618   NITRITE NEGATIVE 11/08/2015 1707   NITRITE Negative 05/30/2014 1618   LEUKOCYTESUR LARGE* 11/08/2015 1707   LEUKOCYTESUR Negative 05/30/2014 1618   Sepsis Labs: @LABRCNTIP (procalcitonin:4,lacticidven:4)  ) Recent Results (from the past 240 hour(s))  MRSA PCR Screening     Status: Abnormal   Collection Time: 11/08/15  9:58 PM  Result Value Ref Range Status   MRSA by PCR POSITIVE (A) NEGATIVE Final    Comment:        The GeneXpert MRSA Assay (FDA approved for NASAL specimens only), is one component of a comprehensive MRSA colonization surveillance program. It is not intended to diagnose MRSA infection nor to guide or monitor treatment for MRSA infections. RESULT CALLED TO, READ BACK BY AND VERIFIED WITH: [name],[name] [id number] 11/09/15 MITCHELL,L   C difficile quick scan w PCR reflex     Status: None   Collection Time: 11/12/15 10:47 PM  Result Value Ref Range Status   C Diff antigen NEGATIVE NEGATIVE Final   C Diff toxin NEGATIVE NEGATIVE Final   C Diff interpretation Negative for toxigenic C. difficile  Final         Radiology Studies: No results found.      Scheduled Meds: . sodium chloride   Intravenous Once  . darbepoetin (ARANESP) injection - NON-DIALYSIS  60 mcg Subcutaneous Q Mon-1800  . gentamicin cream  1 application Topical Daily  . insulin aspart  0-15 Units Subcutaneous Q4H  . levETIRAcetam  500 mg Oral BID  . levothyroxine  50 mcg Oral QAC  breakfast  . multivitamin  1 tablet Oral QHS  . ondansetron (ZOFRAN) IV  4 mg Intravenous Q6H  . pantoprazole   40 mg Oral BID  . phenytoin  100 mg Oral QHS   Continuous Infusions: . sodium chloride 30 mL/hr (11/14/15 1149)     LOS: 7 days    Time spent: 40 minutes    WOODS, Geraldo Docker, MD [facility] Pager [phone]   If 7PM-7AM, please contact night-coverage [email or web address] Password [email or web address] 11/15/2015, 1:11 PM

## 2015-11-16 ENCOUNTER — Inpatient Hospital Stay (HOSPITAL_COMMUNITY): Payer: Medicare (Managed Care)

## 2015-11-16 DIAGNOSIS — I639 Cerebral infarction, unspecified: Secondary | ICD-10-CM | POA: Diagnosis present

## 2015-11-16 DIAGNOSIS — I6300 Cerebral infarction due to thrombosis of unspecified precerebral artery: Secondary | ICD-10-CM

## 2015-11-16 DIAGNOSIS — I998 Other disorder of circulatory system: Secondary | ICD-10-CM

## 2015-11-16 DIAGNOSIS — I749 Embolism and thrombosis of unspecified artery: Secondary | ICD-10-CM

## 2015-11-16 DIAGNOSIS — I7090 Unspecified atherosclerosis: Secondary | ICD-10-CM | POA: Diagnosis present

## 2015-11-16 DIAGNOSIS — I999 Unspecified disorder of circulatory system: Secondary | ICD-10-CM

## 2015-11-16 LAB — BASIC METABOLIC PANEL
Anion gap: 13 (ref 5–15)
BUN: 18 mg/dL (ref 6–20)
CALCIUM: 8.3 mg/dL — AB (ref 8.9–10.3)
CO2: 24 mmol/L (ref 22–32)
Chloride: 98 mmol/L — ABNORMAL LOW (ref 101–111)
Creatinine, Ser: 4.76 mg/dL — ABNORMAL HIGH (ref 0.44–1.00)
GFR calc Af Amer: 11 mL/min — ABNORMAL LOW (ref >60)
GFR, EST NON AFRICAN AMERICAN: 9 mL/min — AB (ref >60)
GLUCOSE: 95 mg/dL (ref 65–99)
Potassium: 3.2 mmol/L — ABNORMAL LOW (ref 3.5–5.1)
Sodium: 135 mmol/L (ref 135–145)

## 2015-11-16 LAB — GLUCOSE, CAPILLARY
GLUCOSE-CAPILLARY: 130 mg/dL — AB (ref 65–99)
GLUCOSE-CAPILLARY: 160 mg/dL — AB (ref 65–99)
Glucose-Capillary: 123 mg/dL — ABNORMAL HIGH (ref 65–99)
Glucose-Capillary: 151 mg/dL — ABNORMAL HIGH (ref 65–99)
Glucose-Capillary: 193 mg/dL — ABNORMAL HIGH (ref 65–99)
Glucose-Capillary: 75 mg/dL (ref 65–99)
Glucose-Capillary: 83 mg/dL (ref 65–99)

## 2015-11-16 LAB — CBC WITH DIFFERENTIAL/PLATELET
Basophils Absolute: 0 10*3/uL (ref 0.0–0.1)
Basophils Relative: 0 %
EOS ABS: 0.1 10*3/uL (ref 0.0–0.7)
EOS PCT: 1 %
HCT: 37.1 % (ref 36.0–46.0)
Hemoglobin: 11 g/dL — ABNORMAL LOW (ref 12.0–15.0)
LYMPHS ABS: 2.7 10*3/uL (ref 0.7–4.0)
Lymphocytes Relative: 19 %
MCH: 26 pg (ref 26.0–34.0)
MCHC: 29.6 g/dL — AB (ref 30.0–36.0)
MCV: 87.7 fL (ref 78.0–100.0)
MONOS PCT: 10 %
Monocytes Absolute: 1.4 10*3/uL — ABNORMAL HIGH (ref 0.1–1.0)
Neutro Abs: 9.6 10*3/uL — ABNORMAL HIGH (ref 1.7–7.7)
Neutrophils Relative %: 70 %
PLATELETS: 292 10*3/uL (ref 150–400)
RBC: 4.23 MIL/uL (ref 3.87–5.11)
RDW: 20.2 % — ABNORMAL HIGH (ref 11.5–15.5)
WBC: 13.8 10*3/uL — ABNORMAL HIGH (ref 4.0–10.5)

## 2015-11-16 LAB — MAGNESIUM: MAGNESIUM: 1.6 mg/dL — AB (ref 1.7–2.4)

## 2015-11-16 MED ORDER — MAGNESIUM SULFATE 2 GM/50ML IV SOLN
2.0000 g | Freq: Once | INTRAVENOUS | Status: AC
  Administered 2015-11-16: 2 g via INTRAVENOUS
  Filled 2015-11-16: qty 50

## 2015-11-16 MED ORDER — POTASSIUM CHLORIDE CRYS ER 20 MEQ PO TBCR
30.0000 meq | EXTENDED_RELEASE_TABLET | Freq: Once | ORAL | Status: AC
  Administered 2015-11-16: 30 meq via ORAL
  Filled 2015-11-16: qty 1

## 2015-11-16 NOTE — Consult Note (Signed)
Reason for Consult:right hand swelling Referring Physician: Jadan Davidson is an 60 y.o. female.  HPI: s/p dilantin extravasation into right wrist around 36 hours ago  Past Medical History  Diagnosis Date  . CVA, old, hemiparesis ([place])   . Hypertension   . Diabetes ([place])   . ESRD on peritoneal dialysis ([place])   . CHF (congestive heart failure) ([place])   . Hypothyroidism   . Seizures ([place])   . Stroke [facility])     Past Surgical History  Procedure Laterality Date  . Cholecystectomy    . Amputation toe    . Insertion of dialysis catheter      History reviewed. No pertinent family history.  Social History:  reports that she has quit smoking. Her smoking use included Cigarettes. She has a 30 pack-year smoking history. She does not have any smokeless tobacco history on file. She reports that she does not drink alcohol or use illicit drugs.  Allergies:  Allergies  Allergen Reactions  . Adhesive [Tape] Hives    Please use paper tape  . Salicylates Hives and Nausea Only    Uncoated.  Diona Fanti [Aspirin] Other (See Comments)    GI upset for non-enteric coated aspirin    Medications:  Scheduled: . sodium chloride   Intravenous Once  . darbepoetin (ARANESP) injection - NON-DIALYSIS  60 mcg Subcutaneous Q Mon-1800  . gentamicin cream  1 application Topical Daily  . insulin aspart  0-15 Units Subcutaneous Q4H  . levETIRAcetam  500 mg Oral BID  . levothyroxine  50 mcg Oral QAC breakfast  . magnesium sulfate 1 - 4 g bolus IVPB  2 g Intravenous Once  . multivitamin  1 tablet Oral QHS  . ondansetron (ZOFRAN) IV  4 mg Intravenous Q6H  . pantoprazole  40 mg Oral BID  . phenytoin  100 mg Oral QHS    Results for orders placed or performed during the hospital encounter of 11/08/15 (from the past 48 hour(s))  Glucose, capillary     Status: Abnormal   Collection Time: 11/14/15 12:48 PM  Result Value Ref Range   Glucose-Capillary 114 (H) 65 - 99 mg/dL  Glucose, capillary     Status:  Abnormal   Collection Time: 11/14/15  5:06 PM  Result Value Ref Range   Glucose-Capillary 136 (H) 65 - 99 mg/dL  Glucose, capillary     Status: Abnormal   Collection Time: 11/14/15  8:21 PM  Result Value Ref Range   Glucose-Capillary 168 (H) 65 - 99 mg/dL   Comment 1 Notify RN    Comment 2 Document in Chart   Glucose, capillary     Status: Abnormal   Collection Time: 11/15/15 12:32 AM  Result Value Ref Range   Glucose-Capillary 104 (H) 65 - 99 mg/dL  CBC with Differential/Platelet     Status: Abnormal   Collection Time: 11/15/15  2:40 AM  Result Value Ref Range   WBC 14.8 (H) 4.0 - 10.5 K/uL   RBC 4.38 3.87 - 5.11 MIL/uL   Hemoglobin 11.4 (L) 12.0 - 15.0 g/dL   HCT 38.7 36.0 - 46.0 %   MCV 88.4 78.0 - 100.0 fL   MCH 26.0 26.0 - 34.0 pg   MCHC 29.5 (L) 30.0 - 36.0 g/dL   RDW 20.5 (H) 11.5 - 15.5 %   Platelets 330 150 - 400 K/uL   Neutrophils Relative % 71 %   Neutro Abs 10.5 (H) 1.7 - 7.7 K/uL   Lymphocytes Relative 19 %  Lymphs Abs 2.9 0.7 - 4.0 K/uL   Monocytes Relative 9 %   Monocytes Absolute 1.4 (H) 0.1 - 1.0 K/uL   Eosinophils Relative 1 %   Eosinophils Absolute 0.1 0.0 - 0.7 K/uL   Basophils Relative 0 %   Basophils Absolute 0.0 0.0 - 0.1 K/uL  Magnesium     Status: None   Collection Time: 11/15/15  2:40 AM  Result Value Ref Range   Magnesium 1.8 1.7 - 2.4 mg/dL  Phosphorus     Status: Abnormal   Collection Time: 11/15/15  2:40 AM  Result Value Ref Range   Phosphorus 6.2 (H) 2.5 - 4.6 mg/dL  Comprehensive metabolic panel     Status: Abnormal   Collection Time: 11/15/15  2:40 AM  Result Value Ref Range   Sodium 139 135 - 145 mmol/L   Potassium 3.6 3.5 - 5.1 mmol/L   Chloride 99 (L) 101 - 111 mmol/L   CO2 27 22 - 32 mmol/L   Glucose, Bld 75 65 - 99 mg/dL   BUN 23 (H) 6 - 20 mg/dL   Creatinine, Ser 5.28 (H) 0.44 - 1.00 mg/dL   Calcium 9.0 8.9 - 10.3 mg/dL   Total Protein 6.8 6.5 - 8.1 g/dL   Albumin 1.9 (L) 3.5 - 5.0 g/dL   AST 18 15 - 41 U/L   ALT 8 (L)  14 - 54 U/L   Alkaline Phosphatase 83 38 - 126 U/L   Total Bilirubin 0.2 (L) 0.3 - 1.2 mg/dL   GFR calc non Af Amer 8 (L) >60 mL/min   GFR calc Af Amer 9 (L) >60 mL/min    Comment: (NOTE) The eGFR has been calculated using the CKD EPI equation. This calculation has not been validated in all clinical situations. eGFR's persistently <60 mL/min signify possible Chronic Kidney Disease.    Anion gap 13 5 - 15  Glucose, capillary     Status: None   Collection Time: 11/15/15  4:32 AM  Result Value Ref Range   Glucose-Capillary 83 65 - 99 mg/dL  Glucose, capillary     Status: Abnormal   Collection Time: 11/15/15  7:29 AM  Result Value Ref Range   Glucose-Capillary 105 (H) 65 - 99 mg/dL  Glucose, capillary     Status: Abnormal   Collection Time: 11/15/15 12:37 PM  Result Value Ref Range   Glucose-Capillary 155 (H) 65 - 99 mg/dL  Glucose, capillary     Status: Abnormal   Collection Time: 11/15/15  5:01 PM  Result Value Ref Range   Glucose-Capillary 105 (H) 65 - 99 mg/dL  Glucose, capillary     Status: Abnormal   Collection Time: 11/15/15  7:40 PM  Result Value Ref Range   Glucose-Capillary 136 (H) 65 - 99 mg/dL  Glucose, capillary     Status: Abnormal   Collection Time: 11/15/15 11:34 PM  Result Value Ref Range   Glucose-Capillary 151 (H) 65 - 99 mg/dL  Glucose, capillary     Status: None   Collection Time: 11/16/15  4:10 AM  Result Value Ref Range   Glucose-Capillary 75 65 - 99 mg/dL  CBC with Differential/Platelet     Status: Abnormal   Collection Time: 11/16/15  6:34 AM  Result Value Ref Range   WBC 13.8 (H) 4.0 - 10.5 K/uL   RBC 4.23 3.87 - 5.11 MIL/uL   Hemoglobin 11.0 (L) 12.0 - 15.0 g/dL   HCT 37.1 36.0 - 46.0 %   MCV 87.7 78.0 - 100.0  fL   MCH 26.0 26.0 - 34.0 pg   MCHC 29.6 (L) 30.0 - 36.0 g/dL   RDW 20.2 (H) 11.5 - 15.5 %   Platelets 292 150 - 400 K/uL   Neutrophils Relative % 70 %   Neutro Abs 9.6 (H) 1.7 - 7.7 K/uL   Lymphocytes Relative 19 %   Lymphs Abs 2.7  0.7 - 4.0 K/uL   Monocytes Relative 10 %   Monocytes Absolute 1.4 (H) 0.1 - 1.0 K/uL   Eosinophils Relative 1 %   Eosinophils Absolute 0.1 0.0 - 0.7 K/uL   Basophils Relative 0 %   Basophils Absolute 0.0 0.0 - 0.1 K/uL  Magnesium     Status: Abnormal   Collection Time: 11/16/15  6:34 AM  Result Value Ref Range   Magnesium 1.6 (L) 1.7 - 2.4 mg/dL  Basic metabolic panel     Status: Abnormal   Collection Time: 11/16/15  6:34 AM  Result Value Ref Range   Sodium 135 135 - 145 mmol/L   Potassium 3.2 (L) 3.5 - 5.1 mmol/L   Chloride 98 (L) 101 - 111 mmol/L   CO2 24 22 - 32 mmol/L   Glucose, Bld 95 65 - 99 mg/dL   BUN 18 6 - 20 mg/dL   Creatinine, Ser 4.76 (H) 0.44 - 1.00 mg/dL   Calcium 8.3 (L) 8.9 - 10.3 mg/dL   GFR calc non Af Amer 9 (L) >60 mL/min   GFR calc Af Amer 11 (L) >60 mL/min    Comment: (NOTE) The eGFR has been calculated using the CKD EPI equation. This calculation has not been validated in all clinical situations. eGFR's persistently <60 mL/min signify possible Chronic Kidney Disease.    Anion gap 13 5 - 15  Glucose, capillary     Status: Abnormal   Collection Time: 11/16/15  8:03 AM  Result Value Ref Range   Glucose-Capillary 123 (H) 65 - 99 mg/dL    No results found.  Review of Systems  All other systems reviewed and are negative.  Blood pressure 72/50, pulse 88, temperature 98.7 F (37.1 C), temperature source Oral, resp. rate 17, height 5' 6"  (1.676 m), weight 56.2 kg (123 lb 14.4 oz), SpO2 98 %. Physical Exam  Constitutional: She appears well-developed.  HENT:  Head: Normocephalic and atraumatic.  Cardiovascular: Normal rate.   Respiratory: Effort normal.  Musculoskeletal:       Right hand: She exhibits swelling.  Right hand dorsal swelling s/p accidental dilantin extravasation into right wrist  No signs of C/S or infection at this time  Neurological: She is alert.  Skin: Skin is warm. There is erythema.    Assessment/Plan: As above  No sign of C/S  at this point in time or infection  Would elevate in mission sling and will follow with you  Sherri Davidson A 11/16/2015, 11:47 AM

## 2015-11-16 NOTE — Progress Notes (Signed)
Subjective: Interval History: has no complaint .  Objective: Vital signs in last 24 hours: Temp:  [98.2 F (36.8 C)-98.7 F (37.1 C)] 98.7 F (37.1 C) (06/04 0700) Pulse Rate:  [86-94] 88 (06/04 0700) Resp:  [16-22] 17 (06/04 0700) BP: (72-131)/(50-82) 72/50 mmHg (06/04 0700) SpO2:  [97 %-99 %] 98 % (06/04 0700) Weight:  [56.2 kg (123 lb 14.4 oz)] 56.2 kg (123 lb 14.4 oz) (06/04 0410) Weight change: 3.1 kg (6 lb 13.4 oz)  Intake/Output from previous day: 06/03 0701 - 06/04 0700 In: 27062 [P.O.:240; I.V.:550] Out: 12680  Intake/Output this shift:    General appearance: Ox1 Resp: diminished breath sounds bilaterally Cardio: S1, S2 normal and systolic murmur: holosystolic 2/6, blowing at apex GI: pos FW, soft, PD cath L mid abdm Extremities: edema no edema.  R hand swollen up to elbow, warm and as stated  Lab Results:  Recent Labs  11/15/15 0240 11/16/15 0634  WBC 14.8* 13.8*  HGB 11.4* 11.0*  HCT 38.7 37.1  PLT 330 292   BMET:  Recent Labs  11/15/15 0240 11/16/15 0634  NA 139 135  K 3.6 3.2*  CL 99* 98*  CO2 27 24  GLUCOSE 75 95  BUN 23* 18  CREATININE 5.28* 4.76*  CALCIUM 9.0 8.3*   No results for input(s): PTH in the last 72 hours. Iron Studies: No results for input(s): IRON, TIBC, TRANSFERRIN, FERRITIN in the last 72 hours.  Studies/Results: No results found.  I have reviewed the patient's current medications.  Assessment/Plan: 1 ESRD PD going well. Using all 1.5%.  tol well 2 Confusion no change 3 Anemia stable 4 R arm ? Cellulitis per Primary 5 Cachexia 6 Sz  on meds DPH lower P PD with 1.5%    LOS: 8 days   Sherri Davidson L 11/16/2015,8:26 AM

## 2015-11-16 NOTE — Progress Notes (Signed)
VASCULAR LAB PRELIMINARY  PRELIMINARY  PRELIMINARY  PRELIMINARY  Right upper extremity arterial duplex completed.    Preliminary report:  There appears to be adequate arterial blood flow throughout the radial, ulnar, brachial, and subclavian arteries.  Cannot rule out thrombus versus mild stenosis in the axillary artery.  The artery is tortuous. Some of the waveforms appear post stenotic with turbulent color flow noted.  Incidentally, there is superficial thrombosis noted throughout the right cephalic vein from forearm to upper arm as well as in the basilic vein from mid upper arm to just before the axilla.   Called report to Dr. Oneida Alar.    Sherri Davidson, RVT 11/16/2015, 12:30 PM

## 2015-11-16 NOTE — Progress Notes (Signed)
Noted right hand and arm swollen red,with tingling  and pain with pulse per doppler, iv team consulted, MD made aware. Will continue to monitor.

## 2015-11-16 NOTE — Consult Note (Addendum)
Referring Physician: Dr Dia Crawford MD Patient name: Sherri Davidson MRN: [id number] DOB: [birth year] Sex: female  REASON FOR CONSULT: right hand ischemia  HPI: Sherri Davidson is a 60 y.o. female,  Admitted with seizures.  Received IV dilantin right arm through IV near cephalic vein at wrist.  Had infiltration of this about 36 hrs ago.  Pt is a very poor historian.  This was treated with removal of the IV and elevation.  Overnight her hand has become progressively more dusky and edematous.  Pt states it hurts and has guarding if you try to approach it. Other medical problems include prior stroke, confusion, ESRD on PD, CHF, DM, hypertension.  These are currently stable.  Past Medical History  Diagnosis Date  . CVA, old, hemiparesis ([place])   . Hypertension   . Diabetes ([place])   . ESRD on peritoneal dialysis ([place])   . CHF (congestive heart failure) ([place])   . Hypothyroidism   . Seizures ([place])   . Stroke [facility])    Past Surgical History  Procedure Laterality Date  . Cholecystectomy    . Amputation toe    . Insertion of dialysis catheter      History reviewed. No pertinent family history.  SOCIAL HISTORY: Social History   Social History  . Marital Status: Single    Spouse Name: N/A  . Number of Children: N/A  . Years of Education: N/A   Occupational History  . retired    Social History Main Topics  . Smoking status: Former Smoker -- 1.00 packs/day for 30 years    Types: Cigarettes  . Smokeless tobacco: Not on file  . Alcohol Use: No  . Drug Use: No  . Sexual Activity: Not on file   Other Topics Concern  . Not on file   Social History Narrative    Allergies  Allergen Reactions  . Adhesive [Tape] Hives    Please use paper tape  . Salicylates Hives and Nausea Only    Uncoated.  Diona Fanti [Aspirin] Other (See Comments)    GI upset for non-enteric coated aspirin    Current Facility-Administered Medications  Medication Dose Route Frequency Provider Last Rate Last Dose    . 0.9 %  sodium chloride infusion   Intravenous Once Allie Bossier, MD      . 0.9 %  sodium chloride infusion   Intravenous Continuous Allie Bossier, MD 30 mL/hr at 11/16/15 0049    . acetaminophen (TYLENOL) suppository 650 mg  650 mg Rectal Q6H PRN Norval Morton, MD      . acetaminophen (TYLENOL) tablet 650 mg  650 mg Oral Q6H PRN Cherene Altes, MD      . albuterol (PROVENTIL) (2.5 MG/3ML) 0.083% nebulizer solution 2.5 mg  2.5 mg Nebulization Q2H PRN Norval Morton, MD      . Darbepoetin Alfa (ARANESP) injection 60 mcg  60 mcg Subcutaneous Q Mon-1800 Elmarie Shiley, MD   60 mcg at 11/10/15 1807  . gentamicin cream (GARAMYCIN) 0.1 % 1 application  1 application Topical Daily Elmarie Shiley, MD   1 application at 17/00/17 2340  . insulin aspart (novoLOG) injection 0-15 Units  0-15 Units Subcutaneous Q4H Allie Bossier, MD   2 Units at 11/16/15 [phone]  . levETIRAcetam (KEPPRA) tablet 500 mg  500 mg Oral BID Ram Fuller Mandril, MD   500 mg at 11/16/15 9675  . levothyroxine (SYNTHROID, LEVOTHROID) tablet 50 mcg  50 mcg Oral QAC breakfast Dellis Filbert  Hennie Duos, MD   50 mcg at 11/16/15 [phone]  . loperamide (IMODIUM) capsule 2 mg  2 mg Oral PRN Pollie Friar, PA-C   2 mg at 11/14/15 2052  . LORazepam (ATIVAN) injection 1-2 mg  1-2 mg Intravenous Q2H PRN Rondell A Tamala Julian, MD      . magnesium sulfate IVPB 2 g 50 mL  2 g Intravenous Once Allie Bossier, MD      . multivitamin (RENA-VIT) tablet 1 tablet  1 tablet Oral QHS Mauricia Area, MD   1 tablet at 11/15/15 2340  . ondansetron (ZOFRAN) injection 4 mg  4 mg Intravenous Q6H Ladene Artist, MD   4 mg at 11/16/15 [phone]  . pantoprazole (PROTONIX) EC tablet 40 mg  40 mg Oral BID Lavone Nian [place], [place]   40 mg at 11/16/15 0926  . phenytoin (DILANTIN) ER capsule 100 mg  100 mg Oral QHS Ram Fuller Mandril, MD   100 mg at 11/15/15 2340  . potassium chloride (K-DUR,KLOR-CON) CR tablet 30 mEq  30 mEq Oral Once Allie Bossier, MD      . RESOURCE  THICKENUP CLEAR   Oral PRN Elmarie Shiley, MD        ROS:   Unable to obtain due to altered mental status  Physical Examination  Filed Vitals:   11/16/15 0200 11/16/15 0400 11/16/15 0410 11/16/15 0700  BP: 103/70 112/75  72/50  Pulse: 86 90 89 88  Temp:   98.7 F (37.1 C) 98.7 F (37.1 C)  TempSrc:   Oral Oral  Resp: '21 22 20 17  '$ Height:      Weight:   123 lb 14.4 oz (56.2 kg)   SpO2:  99%  98%    Body mass index is 20.01 kg/(m^2).  General:  Alert and oriented x 0, no acute distress HEENT: Normal Pulmonary: Clear to auscultation bilaterally Cardiac: Regular Rate and Rhythm Skin: No rash, right hand dusky and edematous double size of left hand, some edema in forearm Extremity Pulses:  absent radial, brachial, axillary pulse bilaterally,  She does have biphasic ulnar and monophasic radial doppler flow to the right hand, there is also flow in the palmar arch and digital vessels by doppler, right hand is much cooler than left.  Entire hand is edematous and painful to touch. Musculoskeletal: No deformity or edema  Neurologic: Upper and lower extremity motor 4/5 and symmetric with exception of right hand.  She is able to extend and flex her digits somewhat but this causes significant pain and is markedly assymmetric compared to the left hand.  DATA:  CBC    Component Value Date/Time   WBC 13.8* 11/16/2015 0634   WBC 17.4* 05/31/2014 0455   RBC 4.23 11/16/2015 0634   RBC 3.69* 05/31/2014 0455   HGB 11.0* 11/16/2015 0634   HGB 11.3* 05/31/2014 1130   HCT 37.1 11/16/2015 0634   HCT 35.0 05/31/2014 0455   PLT 292 11/16/2015 0634   PLT 282 05/31/2014 0455   MCV 87.7 11/16/2015 0634   MCV 95 05/31/2014 0455   MCH 26.0 11/16/2015 0634   MCH 31.7 05/31/2014 0455   MCHC 29.6* 11/16/2015 0634   MCHC 33.4 05/31/2014 0455   RDW 20.2* 11/16/2015 0634   RDW 12.0 05/31/2014 0455   LYMPHSABS 2.7 11/16/2015 0634   LYMPHSABS 2.1 05/31/2014 0455   MONOABS 1.4* 11/16/2015 0634   MONOABS  0.9 05/31/2014 0455   EOSABS 0.1 11/16/2015 0634   EOSABS 0.0 05/31/2014 0455  BASOSABS 0.0 11/16/2015 0634   BASOSABS 0 05/31/2014 0801   BASOSABS 0.0 05/31/2014 0455    BMET    Component Value Date/Time   NA 135 11/16/2015 0634   NA 135* 05/31/2014 0455   K 3.2* 11/16/2015 0634   K 3.0* 05/31/2014 0455   CL 98* 11/16/2015 0634   CL 98 05/31/2014 0455   CO2 24 11/16/2015 0634   CO2 21 05/31/2014 0455   GLUCOSE 95 11/16/2015 0634   GLUCOSE 223* 05/31/2014 0455   BUN 18 11/16/2015 0634   BUN 35* 05/31/2014 0455   CREATININE 4.76* 11/16/2015 0634   CREATININE 5.19* 05/31/2014 0455   CALCIUM 8.3* 11/16/2015 0634   CALCIUM 9.2 05/31/2014 0455   GFRNONAA 9* 11/16/2015 0634   GFRNONAA 9* 05/31/2014 0455   GFRNONAA 11* 07/16/2013 0420   GFRAA 11* 11/16/2015 0634   GFRAA 11* 05/31/2014 0455   GFRAA 13* 07/16/2013 0420    ASSESSMENT:  Right hand edema now with signs and symptoms of ischemia after IV infiltration.  Difficult to know timing of onset from chart or patient's history but at least   PLAN:  1.  Stat arterial duplex of right upper extremity to rule out arterial thrombosis although not really sure how this would relate to IV infiltration unless significant compartment syndrome  2.  I have also asked Dr Burney Gauze from hand surgery to eval to assess forearm/hand compartment as possible mechanism of ischemia.   Ruta Hinds, MD [facility] Office: [phone] Pager: [phone]

## 2015-11-16 NOTE — Progress Notes (Addendum)
PROGRESS NOTE    FARIA CASELLA  ZTI:[id number] DOB: 1956-06-03 DOA: 11/08/2015 PCP: [facility]   Brief Narrative:  60 y.o. WF PMHx CVA, HTN, CHF, hypothyroidism, DM2, and ESRD on peritoneal hemodialysis daily, gastric ulcer 2011 following EGD/colonoscopy performed in [place] by a [place].   who presented with seizures. Around 1pm the patient was noted to have her first seizure that lasted 5-6 minutes with generalized tonic-clonic movements. EMS was called and patient was able to return to baseline, at which time she refused transport to the hospital. Around 2:18 PM she had a second seizure with acute left-sided facial drooping for which EMS was again called. Patient was very lethargic and altered after her seizing stoped. In the ambulance the patient had a third seizure. Patient was recently taken off of [place] by her Neurologist (Dr. Melrose Nakayama at [facility]). Dilantin was stopped in 06/2015 after the patient had repeated falls. Her last prior seizure activity was 05/2015.  In the ED CT and MRI of the brain showed previous right sided CVA, but no acute abnormalities. UA was positive. UDS negative. Patient was evaluated by Neurology who recommended continuation of Keppra 500 mg daily and Dilantin 100 mg 3 times a day.    Assessment & Plan:   Principal Problem:   Seizures ([place]) Active Problems:   Pressure ulcer   UTI (lower urinary tract infection)   Acute encephalopathy   Hyperkalemia   Todd's paralysis ([name])   ESRD on peritoneal dialysis ([place])   Hallucinations   Dysphagia   Controlled diabetes mellitus type 2 with complications ([name])   Coffee ground emesis   Palliative care encounter   Goals of care, counseling/discussion    Seizures / Todd's paralysis / acute encephalopathy -Per her sister/caregiver/POA Ms.Jaunita Sponsel patient usually is A/O 4 at home. Also states patient's primary neurologist had taken patient off of Dilantin secondary to  increased lethargy. -Hallucinations have resolved.   -5/31 Dilantin level = 11.7  -Decreased Dilantin 100 mg daily per neurology recommendation -Continue Keppra 500 mg daily -Ambulate patient in the hall q shift  CVA -CT and MRI showed multiple old infarcts. Sister aware  Dysphagia -5/30 Passed swallow evaluation; dysphagia 1 nectar thick liquid    Possible Urinary tract infection -Urine is abnormal, but unclear how often pt urinates/if she makes regular urine  -Completed 3 days of empiric antibiotics  -In light of increasing leukocytosis obtain blood culture, urine culture  End-stage renal disease on peritoneal dialysis -PD per Nephrology  -Spoke at length with [place] [facility] nephrology, who is going to speak with her regular nephrologist. Unsure patient will be able to continue PD at home, and unfortunately ability to place patient in SNF which would provide PD slim. Patient also not good candidate for HD. Will await further recommendations -Patient's sister/caregiver/POA Ms.Jaunita Hession per nursing staff has departed on vacation. Will attempt to contact via phone, to discuss plan of care. -Strict in and out since admission - 4.2 L -Daily weight  Filed Weights   11/14/15 0434 11/15/15 0400 11/16/15 0410  Weight: 52.6 kg (115 lb 15.4 oz) 53.1 kg (117 lb 1 oz) 56.2 kg (123 lb 14.4 oz)      DM type 2 controlled with complication -8/25 Hemoglobin A1c= 5.6 -Lipid panel; within ADA guideline -Moderate SSI  Hypokalemia -K Dur 30 mEq  Hypomagnesemia  -Magnesium IV 2 gm  Pressure ulcer v/s MASD See WOC note for wound info and tx recs  Purple glove syndrome (PGS) -Patient initial  dose of IV Dilantin infiltrated. IV administration immediately discontinued and IV removed. -Over last 48 hours patient has elevated hand and had warm compresses applied. Unfortunately has continued to progress.  -Continue to elevate hand, apply warm compress as tolerated obtain right upper  extremity angiogram -Medical literature suggests Hyaluronidase use can be beneficial in these cases, but is off use label of medication. Will await recommendations from vascular surgery -Have consulted Dr. Ruta Hinds vascular surgery. Will await recommendations  Coffee-ground emesis/Hx Gastric ulcer -6/1 Paged at [facility] that patient has had several episodes of coffee-ground emesis just now. -Discontinue heparin products -6/1 @0041  Gastroccult card. Positive -Normal saline 43m/hr; monitor closely for fluid overload secondary to renal failure Recent Labs Lab 11/14/15 0501 11/14/15 0804 11/14/15 1140 11/15/15 0240 11/16/15 0634  HGB 12.6 11.7* 12.6 11.4* 11.0*  -Transfuse for hemoglobin<7 -6/2 consulted [facility]; would not perform EGD at this time secondary to patient's multiple medical problems and stable H/H  RLL lung mass vs Atelectasis vs Pneumonia -Per Ms.Jaunita Lemoine sister did not want any further workup. -Per new 2017 Fleischner society guidelines for Solitary nodule size: >8 mm, in either low or high risk patients Follow-up CT at 3 months and/or CT-PET, and/or biopsy. Patient would require new chest CT on/near July 11      Goals of care -Will attempt to contact patient's sister/caregiver/POA Ms.Jaunita Nabor who is on vacation I/O discuss when we can have meeting to discuss short-term/long-term goals care -6/2; Ms.Jaunita Figueira (sister) met with Palliative Care PA MMelton Alarand insisted that patient symptoms secondary to her being on Dilantin. Neurology contacted requesting Dilantin be DC'd. Will await their recommendation .    DVT prophylaxis: SCD Code Status: Full Family Communication: Spoke with Dr. SDurenda GuthrieMedical Director of [facility] would like to be kept in the loop on plan for Ms.Enberg Cell #[phone] Disposition Plan: SNF(Unlikely given patient on PD)   Consultants:  Dr.James [facility]  Neurology  Dr.Malcolm TSindy [facility]     Procedures/Significant Events:  4/11 CT chest WO contrast;-Oval area of dense consolidation in the posterior, inferior RLL to my: Rounded PNA vs rounded atelectasis vs underlying mass - Small to moderate Rt pleural effusion. -Stable 3 mm benign subpleural nodule LUL -Large calcified disc herniation at the T9-10 level, causing moderate canal stenosis and probable cord compression on the right. 5/27 CT head WO contrast;-Changes of prior infarcts are noted in the distribution of the right middle cerebral artery. -Lacunar infarct again identified on the right in the basal ganglia. No acute hemorrhage, acute infarction or space-occupying mass lesion.  5/27 MRA head;-Findings suspicious for prominent intracranial atherosclerotic changes including significant stenosis left internal carotid artery cavernous segment, significant posterior cerebral artery narrowing greater on the left and decrease number of visualized right middle cerebral artery branch vessels C/W remote right hemispheric infarct. 5/27 MRI brain;-Remote large right hemispheric infarct with encephalomalacia right frontal lobe, parietal lobe and temporal lobe with subsequent dilation right temporal horn. -Remote infarct right corona radiata/lenticular nucleus. Remote small infarct left caudate.Remote small infarct left thalamus.    Cultures 5/27 MRSA by PCR positive 6/3 urine pending 6/3 blood pending  Antimicrobials: Ceftriaxone 5/27 > 5/30   Devices  LINES / TUBES:      Continuous Infusions: . sodium chloride 30 mL/hr at 11/16/15 0049     Subjective:  6/4  A/O  4, overnight patient states right hand and arm pain/swelling has significantly increased.       Objective: Filed Vitals:  11/16/15 0200 11/16/15 0400 11/16/15 0410 11/16/15 0700  BP: 103/70 112/75  72/50  Pulse: 86 90 89 88  Temp:   98.7 F (37.1 C) 98.7 F (37.1 C)  TempSrc:   Oral Oral  Resp: 21 22 20 17   Height:      Weight:   56.2 kg (123  lb 14.4 oz)   SpO2:  99%  98%    Intake/Output Summary (Last 24 hours) at 11/16/15 3818 Last data filed at 11/16/15 0600  Gross per 24 hour  Intake  10355 ml  Output  12680 ml  Net  -2325 ml   Filed Weights   11/14/15 0434 11/15/15 0400 11/16/15 0410  Weight: 52.6 kg (115 lb 15.4 oz) 53.1 kg (117 lb 1 oz) 56.2 kg (123 lb 14.4 oz)    Examination: General: A/O 4, follows commands, cachectic No acute respiratory distress Eyes: negative scleral hemorrhage, negative anisocoria, negative icterus ENT: Negative Runny nose, negative gingival bleeding, Neck:  Negative scars, masses, torticollis, lymphadenopathy, JVD Lungs: Clear to auscultation bilaterally without wheezes or crackles Cardiovascular: Regular rate and rhythm without murmur gallop or rub normal S1 and S2 Abdomen: negative abdominal pain, nondistended, positive soft, bowel sounds, no rebound, no ascites, no appreciable mass Extremities: Patient with previous mild cyanosis right upper extremity (infiltration Dilantin 1 dose), overnight significant increase in RUE cyanosis, swelling; now extending past elbow. Fingers now cold to touch, very weak radial pulse palpated. Skin: Negative rashes, lesions, ulcers Psychiatric:  Negative hallucinations, negative depression, negative anxiety  Central nervous system:  Moves all extremities, strength 5/5 all extremities, sensation intact R UE extremely painful palpation, negative dysarthria, negative expressive aphasia, negative receptive aphasia.  .     Data Reviewed: Care during the described time interval was provided by me .  I have reviewed this patient's available data, including medical history, events of note, physical examination, and all test results as part of my evaluation. I have personally reviewed and interpreted all radiology studies.  CBC:  Recent Labs Lab 11/11/15 0447 11/12/15 0316  11/14/15 0501 11/14/15 0804 11/14/15 1140 11/15/15 0240 11/16/15 0634  WBC 7.9  7.8  --  15.2*  --   --  14.8* 13.8*  NEUTROABS  --   --   --  11.9*  --   --  10.5* 9.6*  HGB 11.1* 11.1*  < > 12.6 11.7* 12.6 11.4* 11.0*  HCT 36.5 37.1  < > 42.1 39.1 41.7 38.7 37.1  MCV 86.5 85.9  --  87.9  --   --  88.4 87.7  PLT 342 344  --  358  --   --  330 292  < > = values in this interval not displayed. Basic Metabolic Panel:  Recent Labs Lab 11/10/15 0227 11/11/15 0447 11/12/15 0316 11/13/15 0340 11/14/15 0501 11/15/15 0240 11/16/15 0634  NA 142 139 141 142 139  140 139 135  K 4.2 4.2 2.8* 3.7 3.7  3.7 3.6 3.2*  CL 110 106 105 105 101  101 99* 98*  CO2 22 19* 24 21* 25  23 27 24   GLUCOSE 136* 136* 147* 82 194*  187* 75 95  BUN 30* 22* 18 19 21*  21* 23* 18  CREATININE 5.51* 4.82* 4.89* 4.81* 5.11*  4.93* 5.28* 4.76*  CALCIUM 8.8* 8.5* 8.6* 9.1 9.0  9.0 9.0 8.3*  MG  --   --  2.0  --  1.9 1.8 1.6*  PHOS 6.5* 6.1*  --  6.1* 6.2*  6.1* 6.2*  --  GFR: Estimated Creatinine Clearance: 11.3 mL/min (by C-G formula based on Cr of 4.76). Liver Function Tests:  Recent Labs Lab 11/10/15 0227 11/11/15 0447 11/13/15 0340 11/14/15 0501 11/15/15 0240  AST  --   --   --  19 18  ALT  --   --   --  10* 8*  ALKPHOS  --   --   --  92 83  BILITOT  --   --   --  0.3 0.2*  PROT  --   --   --  7.5 6.8  ALBUMIN 2.1* 2.1* 2.3* 2.2*  2.1* 1.9*   No results for input(s): LIPASE, AMYLASE in the last 168 hours.  Recent Labs Lab 11/14/15 0501  AMMONIA 20   Coagulation Profile: No results for input(s): INR, PROTIME in the last 168 hours. Cardiac Enzymes: No results for input(s): CKTOTAL, CKMB, CKMBINDEX, TROPONINI in the last 168 hours. BNP (last 3 results) No results for input(s): PROBNP in the last 8760 hours. HbA1C: No results for input(s): HGBA1C in the last 72 hours. CBG:  Recent Labs Lab 11/15/15 1237 11/15/15 1701 11/15/15 1940 11/15/15 2334 11/16/15 0410  GLUCAP 155* 105* 136* 151* 75   Lipid Profile: No results for input(s): CHOL, HDL,  LDLCALC, TRIG, CHOLHDL, LDLDIRECT in the last 72 hours. Thyroid Function Tests: No results for input(s): TSH, T4TOTAL, FREET4, T3FREE, THYROIDAB in the last 72 hours. Anemia Panel: No results for input(s): VITAMINB12, FOLATE, FERRITIN, TIBC, IRON, RETICCTPCT in the last 72 hours. Urine analysis:    Component Value Date/Time   COLORURINE YELLOW 11/08/2015 1707   COLORURINE Yellow 05/30/2014 1618   APPEARANCEUR TURBID* 11/08/2015 1707   APPEARANCEUR Clear 05/30/2014 1618   LABSPEC 1.018 11/08/2015 1707   LABSPEC 1.009 05/30/2014 1618   PHURINE 7.5 11/08/2015 1707   PHURINE 7.0 05/30/2014 1618   GLUCOSEU 100* 11/08/2015 1707   GLUCOSEU >=500 05/30/2014 1618   HGBUR LARGE* 11/08/2015 1707   HGBUR Negative 05/30/2014 1618   BILIRUBINUR NEGATIVE 11/08/2015 1707   BILIRUBINUR Negative 05/30/2014 1618   KETONESUR NEGATIVE 11/08/2015 1707   KETONESUR Negative 05/30/2014 1618   PROTEINUR >300* 11/08/2015 1707   PROTEINUR 100 mg/dL 05/30/2014 1618   NITRITE NEGATIVE 11/08/2015 1707   NITRITE Negative 05/30/2014 1618   LEUKOCYTESUR LARGE* 11/08/2015 1707   LEUKOCYTESUR Negative 05/30/2014 1618   Sepsis Labs: @LABRCNTIP (procalcitonin:4,lacticidven:4)  ) Recent Results (from the past 240 hour(s))  MRSA PCR Screening     Status: Abnormal   Collection Time: 11/08/15  9:58 PM  Result Value Ref Range Status   MRSA by PCR POSITIVE (A) NEGATIVE Final    Comment:        The GeneXpert MRSA Assay (FDA approved for NASAL specimens only), is one component of a comprehensive MRSA colonization surveillance program. It is not intended to diagnose MRSA infection nor to guide or monitor treatment for MRSA infections. RESULT CALLED TO, READ BACK BY AND VERIFIED WITH: [name],[name] [phone] 11/09/15 MITCHELL,L   C difficile quick scan w PCR reflex     Status: None   Collection Time: 11/12/15 10:47 PM  Result Value Ref Range Status   C Diff antigen NEGATIVE NEGATIVE Final   C Diff toxin NEGATIVE  NEGATIVE Final   C Diff interpretation Negative for toxigenic C. difficile  Final         Radiology Studies: No results found.      Scheduled Meds: . sodium chloride   Intravenous Once  . darbepoetin (ARANESP) injection - NON-DIALYSIS  60 mcg Subcutaneous Q Mon-1800  . gentamicin cream  1 application Topical Daily  . insulin aspart  0-15 Units Subcutaneous Q4H  . levETIRAcetam  500 mg Oral BID  . levothyroxine  50 mcg Oral QAC breakfast  . multivitamin  1 tablet Oral QHS  . ondansetron (ZOFRAN) IV  4 mg Intravenous Q6H  . pantoprazole  40 mg Oral BID  . phenytoin  100 mg Oral QHS   Continuous Infusions: . sodium chloride 30 mL/hr at 11/16/15 0049     LOS: 8 days    Time spent: 40 minutes    WOODS, Geraldo Docker, MD [facility] Pager [phone]   If 7PM-7AM, please contact night-coverage [email or web address] Password [email or web address] 11/16/2015, 9:27 AM

## 2015-11-17 DIAGNOSIS — E43 Unspecified severe protein-calorie malnutrition: Secondary | ICD-10-CM | POA: Diagnosis present

## 2015-11-17 DIAGNOSIS — K625 Hemorrhage of anus and rectum: Secondary | ICD-10-CM

## 2015-11-17 LAB — COMPREHENSIVE METABOLIC PANEL
ALT: 10 U/L — AB (ref 14–54)
AST: 18 U/L (ref 15–41)
Albumin: 1.7 g/dL — ABNORMAL LOW (ref 3.5–5.0)
Alkaline Phosphatase: 77 U/L (ref 38–126)
Anion gap: 13 (ref 5–15)
BILIRUBIN TOTAL: 0.4 mg/dL (ref 0.3–1.2)
BUN: 20 mg/dL (ref 6–20)
CALCIUM: 7.9 mg/dL — AB (ref 8.9–10.3)
CO2: 24 mmol/L (ref 22–32)
CREATININE: 4.74 mg/dL — AB (ref 0.44–1.00)
Chloride: 100 mmol/L — ABNORMAL LOW (ref 101–111)
GFR calc Af Amer: 11 mL/min — ABNORMAL LOW (ref >60)
GFR, EST NON AFRICAN AMERICAN: 9 mL/min — AB (ref >60)
Glucose, Bld: 48 mg/dL — ABNORMAL LOW (ref 65–99)
Potassium: 3.1 mmol/L — ABNORMAL LOW (ref 3.5–5.1)
Sodium: 137 mmol/L (ref 135–145)
TOTAL PROTEIN: 5.7 g/dL — AB (ref 6.5–8.1)

## 2015-11-17 LAB — CBC
HEMATOCRIT: 34 % — AB (ref 36.0–46.0)
HEMOGLOBIN: 10.4 g/dL — AB (ref 12.0–15.0)
MCH: 27.2 pg (ref 26.0–34.0)
MCHC: 30.6 g/dL (ref 30.0–36.0)
MCV: 88.8 fL (ref 78.0–100.0)
PLATELETS: 261 10*3/uL (ref 150–400)
RBC: 3.83 MIL/uL — AB (ref 3.87–5.11)
RDW: 19.6 % — ABNORMAL HIGH (ref 11.5–15.5)
WBC: 11.8 10*3/uL — AB (ref 4.0–10.5)

## 2015-11-17 LAB — GLUCOSE, CAPILLARY
GLUCOSE-CAPILLARY: 67 mg/dL (ref 65–99)
Glucose-Capillary: 72 mg/dL (ref 65–99)
Glucose-Capillary: 130 mg/dL — ABNORMAL HIGH (ref 65–99)
Glucose-Capillary: 212 mg/dL — ABNORMAL HIGH (ref 65–99)
Glucose-Capillary: 129 mg/dL — ABNORMAL HIGH (ref 65–99)
Glucose-Capillary: 96 mg/dL (ref 65–99)

## 2015-11-17 LAB — MAGNESIUM: MAGNESIUM: 2.2 mg/dL (ref 1.7–2.4)

## 2015-11-17 LAB — CBC WITH DIFFERENTIAL/PLATELET
Basophils Absolute: 0 10*3/uL (ref 0.0–0.1)
Basophils Relative: 0 %
EOS PCT: 2 %
Eosinophils Absolute: 0.2 10*3/uL (ref 0.0–0.7)
HEMATOCRIT: 31.9 % — AB (ref 36.0–46.0)
HEMOGLOBIN: 9.7 g/dL — AB (ref 12.0–15.0)
LYMPHS ABS: 3.8 10*3/uL (ref 0.7–4.0)
LYMPHS PCT: 27 %
MCH: 26.3 pg (ref 26.0–34.0)
MCHC: 30.4 g/dL (ref 30.0–36.0)
MCV: 86.4 fL (ref 78.0–100.0)
MONO ABS: 1.6 10*3/uL — AB (ref 0.1–1.0)
Monocytes Relative: 12 %
Neutro Abs: 8.4 10*3/uL — ABNORMAL HIGH (ref 1.7–7.7)
Neutrophils Relative %: 59 %
Platelets: 279 10*3/uL (ref 150–400)
RBC: 3.69 MIL/uL — ABNORMAL LOW (ref 3.87–5.11)
RDW: 19.7 % — ABNORMAL HIGH (ref 11.5–15.5)
WBC: 14 10*3/uL — ABNORMAL HIGH (ref 4.0–10.5)

## 2015-11-17 LAB — PHENYTOIN LEVEL, FREE AND TOTAL
PHENYTOIN FREE: UNDETERMINED ug/mL
PHENYTOIN, TOTAL: 9.1 ug/mL — AB (ref 10.0–20.0)
PHENYTOIN, TOTAL: 3.6 ug/mL — AB (ref 10.0–20.0)
Phenytoin, Free: 0.6 ug/mL — ABNORMAL LOW (ref 1.0–2.0)

## 2015-11-17 LAB — PHOSPHORUS: Phosphorus: 5.9 mg/dL — ABNORMAL HIGH (ref 2.5–4.6)

## 2015-11-17 MED ORDER — INSULIN ASPART 100 UNIT/ML ~~LOC~~ SOLN
0.0000 [IU] | Freq: Three times a day (TID) | SUBCUTANEOUS | Status: DC
  Administered 2015-11-17 – 2015-11-18 (×3): 2 [IU] via SUBCUTANEOUS
  Administered 2015-11-18: 8 [IU] via SUBCUTANEOUS
  Administered 2015-11-19 – 2015-11-20 (×2): 5 [IU] via SUBCUTANEOUS
  Administered 2015-11-21 (×2): 3 [IU] via SUBCUTANEOUS

## 2015-11-17 MED ORDER — POTASSIUM CHLORIDE CRYS ER 20 MEQ PO TBCR
20.0000 meq | EXTENDED_RELEASE_TABLET | Freq: Two times a day (BID) | ORAL | Status: DC
  Administered 2015-11-17 (×2): 20 meq via ORAL
  Filled 2015-11-17 (×2): qty 1

## 2015-11-17 MED ORDER — INSULIN ASPART 100 UNIT/ML ~~LOC~~ SOLN
0.0000 [IU] | Freq: Every day | SUBCUTANEOUS | Status: DC
  Administered 2015-11-20: 2 [IU] via SUBCUTANEOUS

## 2015-11-17 MED ORDER — BOOST / RESOURCE BREEZE PO LIQD
1.0000 | Freq: Three times a day (TID) | ORAL | Status: DC
  Administered 2015-11-17 – 2015-11-21 (×11): 1 via ORAL

## 2015-11-17 NOTE — Progress Notes (Addendum)
Speech Language Pathology Treatment: Dysphagia  Patient Details Name: Sherri Davidson MRN: [id number] DOB: 02-16-1956 Today's Date: 11/17/2015 Time: 1355-1410 SLP Time Calculation (min) (ACUTE ONLY): 15 min  Assessment / Plan / Recommendation Clinical Impression  Pt seen for follow up after BSE completed 11/11/15. RN feeding pt D1/nectar lunch. Pt appears to tolerate current diet, with cough x1 noted during meal, following Ensure Breeze (uncertain if this was thickened to Nectar consistency). GOC are still being established. Will await MD direction regarding proceeding with MBS to determine appropriateness of advancing diet once [place] documented.   HPI HPI: 60 y.o. female with history of CVA, HTN, CHF, hypothyroidism, DM2, and ESRD on peritoneal hemodialysis daily who presented with seizures.  Dx include seizures/Todd's paralysis/acute encephalopathy; dysphagia (coughing with clear liquids); possible UTI.  CXR Persistent right pleural effusion and right lower lung consolidation/atelectasis.      SLP Plan  Continue with current plan of care     Recommendations  Diet recommendations: Dysphagia 1 (puree);Nectar-thick liquid Medication Administration: Whole meds with puree Supervision: Staff to assist with self feeding;Full supervision/cueing for compensatory strategies Compensations: Small sips/bites;Slow rate;Minimize environmental distractions Postural Changes and/or Swallow Maneuvers: Seated upright 90 degrees      Oral Care Recommendations: Oral care BID Follow up Recommendations:  (TBD) Plan: Continue with current plan of care     Shonna Chock 11/17/2015, 2:27 PM  Dareen Gutzwiller B. Quentin Ore [facility] [place] [phone]

## 2015-11-17 NOTE — Progress Notes (Addendum)
[facility] [place]  Subjective  - Patient not verbal this am, open eyes and follows commands.   Objective 89/35 79 97.9 F (36.6 C) (Oral) 22 99%  Intake/Output Summary (Last 24 hours) at 11/17/15 0801 Last data filed at 11/17/15 0700  Gross per 24 hour  Intake  13574 ml  Output  12289 ml  Net   1285 ml    Active range of motion of right digits intact without pain, sensation grossly intact Doppler radial and palmer signals intact Skin indurated in had and forearm, dusky appearance to hand with out open wounds Compartments compressible without pain Hand slightly elevated above elbow   Right upper extremity arterial duplex completed.   Preliminary report: There appears to be adequate arterial blood flow throughout the radial, ulnar, brachial, and subclavian arteries. Cannot rule out thrombus versus mild stenosis in the axillary artery. The artery is tortuous. Some of the waveforms appear post stenotic with turbulent color flow noted. Incidentally, there is superficial thrombosis noted throughout the right cephalic vein from forearm to upper arm as well as in the basilic vein from mid upper arm to just before the axilla.   Assessment/Planning: Right hand IV infiltrate She has adequate arterial flow Continue with elevation and encourage active range of motion   COLLINS, EMMA MAUREEN 11/17/2015 8:01 AM -- Agree with above.  Hand warmer today.  Now with some blisters over thumb.  Still painful to touch fingers cool.  Has palmar and digital artery flow.  No thrombus on duplex yesterday.  Elevate as much as possible.  No vascular issue at this point.  Will sign off  Ruta Hinds, MD [facility] [place]: [phone] Pager: [phone]  Laboratory Lab Results:  Recent Labs  11/16/15 0634 11/17/15 0356  WBC 13.8* 14.0*  HGB 11.0* 9.7*  HCT 37.1 31.9*  PLT 292 279   BMET  Recent Labs  11/16/15 0634  11/17/15 0356  NA 135 137  K 3.2* 3.1*  CL 98* 100*  CO2 24 24  GLUCOSE 95 48*  BUN 18 20  CREATININE 4.76* 4.74*  CALCIUM 8.3* 7.9*    COAG Lab Results  Component Value Date   INR 1.06 11/08/2015   INR 1.10 05/23/2015   INR 1.0 05/30/2014   No results found for: PTT

## 2015-11-17 NOTE — Progress Notes (Signed)
Orthopedic Tech Progress Note Patient Details:  Sherri Davidson 23-Sep-1955 [id number]  Ortho Devices Type of Ortho Device: Arm sling Ortho Device/Splint Location: kuzman sling Ortho Device/Splint Interventions: Ordered, Application   Sherri Davidson 11/17/2015, 6:54 PM

## 2015-11-17 NOTE — Progress Notes (Addendum)
Daily Progress Note   Patient Name: Sherri Davidson       Date: 11/17/2015 DOB: 1956-03-14  Age: 60 y.o. MRN#: [id number] Attending Physician: Cherene Altes, MD Primary Care Physician: [facility] Admit Date: 11/08/2015  Reason for Consultation/Follow-up: Establishing goals of care  Subjective:  "I want to go home"   Patient states right arm and had is very painful.  She is attempting to get back in bed. I spoke with [name] to update her on Zane's condition.  She expressed concern - wanting to be updated more frequently on her sister's condition.   I let [name] know that both an orthopedic surgeon and a hand surgeon had seen Mell's hand and what their recommendations were.  I also let her know that Zaydah was having rectal bleeding.    Margurite was still confused today - although a little less than last Friday (6/1) she was able to tell me she is in [facility].  Length of Stay: 9  Current Medications: Scheduled Meds:  . sodium chloride   Intravenous Once  . darbepoetin (ARANESP) injection - NON-DIALYSIS  60 mcg Subcutaneous Q Mon-1800  . feeding supplement  1 Container Oral TID BM  . gentamicin cream  1 application Topical Daily  . insulin aspart  0-15 Units Subcutaneous Q4H  . levETIRAcetam  500 mg Oral BID  . levothyroxine  50 mcg Oral QAC breakfast  . multivitamin  1 tablet Oral QHS  . ondansetron (ZOFRAN) IV  4 mg Intravenous Q6H  . pantoprazole  40 mg Oral BID  . phenytoin  100 mg Oral QHS  . potassium chloride  20 mEq Oral BID    Continuous Infusions: . sodium chloride 30 mL/hr at 11/17/15 1042    PRN Meds: [DISCONTINUED] acetaminophen **OR** acetaminophen, acetaminophen, albuterol, loperamide, LORazepam, RESOURCE THICKENUP CLEAR  Physical  Exam      Thin frail gray appearing female.  Appears older than stated age.  Patient wakes to touch Extremities.  Left hand is purple.   Able to move all 4 Speech is clear.  Patient is confused. CV RRR Resp: NAD Abdomen:  Soft.  A large amount (at least 1/2 cup) of blood has pooled in the patient's groin area  Vital Signs: BP 86/67 mmHg  Pulse 82  Temp(Src) 97.4 F (36.3  C) (Oral)  Resp 24  Ht '5\' 6"'$  (1.676 m)  Wt 58.8 kg (129 lb 10.1 oz)  BMI 20.93 kg/m2  SpO2 96% SpO2: SpO2: 96 % O2 Device: O2 Device: Not Delivered O2 Flow Rate: O2 Flow Rate (L/min): 5 L/min  Intake/output summary:   Intake/Output Summary (Last 24 hours) at 11/17/15 1437 Last data filed at 11/17/15 1100  Gross per 24 hour  Intake   1350 ml  Output      0 ml  Net   1350 ml   LBM: Last BM Date: 11/16/15 Baseline Weight: Weight: 54.7 kg (120 lb 9.5 oz) Most recent weight: Weight: 58.8 kg (129 lb 10.1 oz) (Simultaneous filing. User may not have seen previous data.)       Palliative Assessment/Data:    Flowsheet Rows        Most Recent Value   Intake Tab    Referral Department  Hospitalist   Unit at Time of Referral  Intermediate Care Unit   Palliative Care Primary Diagnosis  Neurology   Date Notified  11/12/15   Palliative Care Type  New Palliative care   Reason for referral  Clarify Goals of Care   Date of Admission  11/08/15   Date first seen by Palliative Care  11/13/15   # of days Palliative referral response time  1 Day(s)   # of days IP prior to Palliative referral  4   Clinical Assessment    Palliative Performance Scale Score  30%   Psychosocial & Spiritual Assessment    Palliative Care Outcomes       Patient Active Problem List   Diagnosis Date Noted  . Arterial occlusion ([place])   . Stroke (cerebrum) ([place])   . Controlled diabetes mellitus type 2 with complications ([place])   . Coffee ground emesis   . Palliative care encounter   . Goals of care, counseling/discussion   . 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 ESRD on peritoneal dialysis ([place])   . Hallucinations   . Dysphagia   . Acute encephalopathy 11/09/2015  . Hyperkalemia 11/09/2015  . UTI (lower urinary tract infection) 11/08/2015  . Seizures ([employer]) 11/08/2015  . Vomiting 08/17/2015  . Seizure ([place]) 05/24/2015  . Community acquired pneumonia 05/24/2015  . Pressure ulcer 05/24/2015    Palliative Care Assessment & Plan   Assessment: Chronically ill patient on peritoneal dialysis, admitted with seizures and AMS.  Still encephalopathic. Developed "purple hand" on 6/1 - being followed by hand surgery.  Had 4-5 episodes of coffee ground emesis on Thursday PM - Friday AM. (known hx of PUD).  Now with rectal bleeding (vs vaginal?). Hgb is dropping 11->9.7.  Follow up CBC ordered stat.  Recommendations/Plan:  Stop Dilantin.   I have called Neuro for their opinion before discontinuing dilantin.  The patient's desire for "Quality of life over Quantity of life" does not match her code status.   I will continue to work with the patient and her family to refine her [place].    Family - HCPOA, [name] requests daily updates regarding her sister.  Very poor nutritional status.  Appreciate nutrition consultation.   Goals of Care and Additional Recommendations:  Limitations on Scope of Treatment: Full Scope Treatment  Code Status: FULL  Prognosis:   Unable to determine  Discharge Planning:  Home with Palliative Services  Care plan was discussed with Domenick Bookbinder, [facility] MD, as well as Nursing Director (Thanks Duard Brady!)  Thank you for allowing the Palliative Medicine Team to assist  in the care of this patient.   Time In: 1;00 Time Out: 2:00 Total Time 60 Prolonged Time Billed no      Greater than 50%  of this time was spent counseling and coordinating care related to the above assessment and plan.  Melton Alar, PA-C  Please contact Palliative Medicine Team phone at [phone] for questions and concerns.

## 2015-11-17 NOTE — Progress Notes (Signed)
Inpatient Diabetes Program Recommendations  AACE/ADA: New Consensus Statement on Inpatient Glycemic Control (2015)  Target Ranges:  Prepandial:   less than 140 mg/dL      Peak postprandial:   less than 180 mg/dL (1-2 hours)      Critically ill patients:  140 - 180 mg/dL   Lab Results  Component Value Date   GLUCAP 72 11/17/2015   HGBA1C 5.6 11/12/2015   Inpatient Diabetes Program Recommendations:  Please consider decrease in Novolog correction to 0-9 tid with meals.  Thank you, Nani Gasser. Nazarene Bunning, RN, MSN, CDE Inpatient Glycemic Control Team Team Pager [phone] (8am-5pm) 11/17/2015 8:31 AM

## 2015-11-17 NOTE — Progress Notes (Signed)
Initial Nutrition Assessment  DOCUMENTATION CODES:   Non-severe (moderate) malnutrition in context of chronic illness  INTERVENTION:   Boost Breeze po TID, each supplement provides 250 kcal and 9 grams of protein  NUTRITION DIAGNOSIS:   Malnutrition related to chronic illness as evidenced by moderate depletion of body fat, moderate depletions of muscle mass  GOAL:   Patient will meet greater than or equal to 90% of their needs  MONITOR:   PO intake, Supplement acceptance, Labs, Weight trends, I & O's  REASON FOR ASSESSMENT:   Consult Assessment of nutrition requirement/status  ASSESSMENT:   60 y.o. Female admitted with seizures. Received IV dilantin right arm through IV near cephalic vein at wrist. Had infiltration of this about 36 hrs ago. Pt is a very poor historian. This was treated with removal of the IV and elevation. Overnight her hand has become progressively more dusky and edematous. Pt states it hurts and has guarding if you try to approach it. Other medical problems include prior stroke, confusion, ESRD on PD, CHF, DM, hypertension. These are currently stable.  Patient reports her appetite is "OK, I guess." PO intake 75% per flowsheet records. Pt with ESRD on peritoneal dialysis.  Would benefit from oral nutrition supplements >> will order.  Nutrition-Focused physical exam completed. Findings are moderate fat depletion, moderate muscle depletion, and no edema.   Albumin has a half-life of 21 days and is strongly affected by stress response and inflammatory process, therefore, do not expect to see an improvement in this lab value during acute hospitalization.  Diet Order:  Diet clear liquid Room service appropriate?: Yes; Fluid consistency:: Thin  Skin:  Reviewed, no issues  Last BM:  6/4  Height:   Ht Readings from Last 1 Encounters:  11/08/15 '5\' 6"'$  (1.676 m)    Weight:   Wt Readings from Last 1 Encounters:  11/17/15 129 lb 10.1 oz (58.8 kg)     Ideal Body Weight:  59 kg  BMI:  Body mass index is 20.93 kg/(m^2).  Estimated Nutritional Needs:   Kcal:  1500-1700  Protein:  70-80 gm  Fluid:  1.5-1.7 L  EDUCATION NEEDS:   No education needs identified at this time  Arthur Holms, RD, LDN Pager #: [phone] After-Hours Pager #: [phone]

## 2015-11-17 NOTE — Progress Notes (Signed)
Physical Therapy Treatment Patient Details Name: Sherri Davidson MRN: [id number] DOB: [birth year] Today's Date: 11/17/2015    History of Present Illness Pt is a 60 year old Caucasian woman with past medical history significant for end-stage renal disease on peritoneal dialysis (followed by [facility] in [place]) from what appears to be underlying hypertension and diabetes. She also has CVA, hypothyroidism, history of congestive heart failure and seizure disorder. She was brought in after witnessed generalized tonic-clonic seizures and associated right-sided facial droop. She was recently taken off of Keppra possibly because of repeated dizziness and falls.    PT Comments    Sherri Davidson received more alert and appropriate but confused today.  She requires max +2 assist to pivot to chair due to posterior lean.  Pt will benefit from continued skilled PT services to increase functional independence and safety.   Follow Up Recommendations  CIR;Supervision/Assistance - 24 hour (SNF if CIR is not an option)     Equipment Recommendations  Other (comment) (TBD at next venue of care)    Recommendations for Other Services OT consult     Precautions / Restrictions Precautions Precautions: Fall;Other (comment) Precaution Comments: seizure Restrictions Weight Bearing Restrictions: No    Mobility  Bed Mobility Overal bed mobility: Needs Assistance;+2 for physical assistance Bed Mobility: Supine to Sit;Rolling Rolling: Mod assist;+2 for physical assistance   Supine to sit: Max assist;+2 for physical assistance     General bed mobility comments: Assist to roll and sit as pt does not initiate and follows cues inconsistently.  Assist to elevate trunk and use of bed pad for positioning.  Transfers Overall transfer level: Needs assistance Equipment used: 2 person hand held assist Transfers: Sit to/from [facility] Sit to Stand: Max assist;+2 physical  assistance Stand pivot transfers: Max assist;+2 physical assistance       General transfer comment: Assist to boost and steady.  Pt pivots her feet but requires directing to pivot and assist provided for controlled descent.  Ambulation/Gait             General Gait Details: not safe to attempt at this time   Stairs            Wheelchair Mobility    Modified Rankin (Stroke Patients Only)       Balance Overall balance assessment: Needs assistance Sitting-balance support: Feet supported;Bilateral upper extremity supported Sitting balance-Leahy Scale: Poor Sitting balance - Comments: Relies on Bil UE support and pt spontaneously leaning posteriorly requiring mod assist to sit upright Postural control: Posterior lean;Left lateral lean Standing balance support: Bilateral upper extremity supported;During functional activity Standing balance-Leahy Scale: Zero                      Cognition Arousal/Alertness: Awake/alert Behavior During Therapy: Flat affect Overall Cognitive Status: Impaired/Different from baseline Area of Impairment: Orientation;Attention;Memory;Following commands;Safety/judgement;Awareness;Problem solving Orientation Level: Disoriented to;Place;Time;Situation Current Attention Level: Sustained Memory: Decreased short-term memory Following Commands: Follows one step commands inconsistently Safety/Judgement: Decreased awareness of safety;Decreased awareness of deficits Awareness: Intellectual Problem Solving: Slow processing;Decreased initiation;Requires verbal cues;Difficulty sequencing      Exercises General Exercises - Lower Extremity Ankle Circles/Pumps: PROM;Both;10 reps;Seated Quad Sets: AAROM;Both;Seated;10 reps    General Comments        Pertinent Vitals/Pain Pain Assessment: Faces Faces Pain Scale: Hurts even more Pain Location: Rt UE to light touch Pain Descriptors / Indicators: Discomfort;Grimacing;Guarding;Moaning Pain  Intervention(s): Limited activity within patient's tolerance;Monitored during session    Home Living  Prior Function            PT Goals (current goals can now be found in the care plan section) Acute Rehab PT Goals Patient Stated Goal: none stated PT Goal Formulation: With patient Time For Goal Achievement: 11/24/15 Potential to Achieve Goals: Fair Progress towards PT goals: Progressing toward goals    Frequency  Min 3X/week    PT Plan Current plan remains appropriate    Co-evaluation             End of Session Equipment Utilized During Treatment: Gait belt Activity Tolerance: Patient limited by fatigue;Patient limited by pain Patient left: in chair;with call bell/phone within reach;with chair alarm set;with nursing/sitter in room     Time: 4818-5909 PT Time Calculation (min) (ACUTE ONLY): 27 min  Charges:  $Therapeutic Exercise: 8-22 mins $Therapeutic Activity: 8-22 mins                    G Codes:      Sherri Davidson PT, DPT  Pager: [phone] Phone: [phone] 11/17/2015, 2:21 PM

## 2015-11-17 NOTE — Progress Notes (Signed)
S: No new CO O:BP 84/63 mmHg  Pulse 86  Temp(Src) 97.4 F (36.3 C) (Oral)  Resp 17  Ht '5\' 6"'$  (1.676 m)  Wt 58.8 kg (129 lb 10.1 oz)  BMI 20.93 kg/m2  SpO2 96%  Intake/Output Summary (Last 24 hours) at 11/17/15 0919 Last data filed at 11/17/15 0700  Gross per 24 hour  Intake  13304 ml  Output  12289 ml  Net   1015 ml   Weight change: 2.6 kg (5 lb 11.7 oz) Gen: Awake and alert CVS: RRR Resp: Decreased BS bases Abd: + BS NT ND.  PD exit site clean, no drainage Ext: No edema except Rt arm with mild swelling and erythema NEURO: CNI Ox2 (person and place)   . sodium chloride   Intravenous Once  . darbepoetin (ARANESP) injection - NON-DIALYSIS  60 mcg Subcutaneous Q Mon-1800  . gentamicin cream  1 application Topical Daily  . insulin aspart  0-15 Units Subcutaneous Q4H  . levETIRAcetam  500 mg Oral BID  . levothyroxine  50 mcg Oral QAC breakfast  . multivitamin  1 tablet Oral QHS  . ondansetron (ZOFRAN) IV  4 mg Intravenous Q6H  . pantoprazole  40 mg Oral BID  . phenytoin  100 mg Oral QHS   No results found. BMET    Component Value Date/Time   NA 137 11/17/2015 0356   NA 135* 05/31/2014 0455   K 3.1* 11/17/2015 0356   K 3.0* 05/31/2014 0455   CL 100* 11/17/2015 0356   CL 98 05/31/2014 0455   CO2 24 11/17/2015 0356   CO2 21 05/31/2014 0455   GLUCOSE 48* 11/17/2015 0356   GLUCOSE 223* 05/31/2014 0455   BUN 20 11/17/2015 0356   BUN 35* 05/31/2014 0455   CREATININE 4.74* 11/17/2015 0356   CREATININE 5.19* 05/31/2014 0455   CALCIUM 7.9* 11/17/2015 0356   CALCIUM 9.2 05/31/2014 0455   GFRNONAA 9* 11/17/2015 0356   GFRNONAA 9* 05/31/2014 0455   GFRNONAA 11* 07/16/2013 0420   GFRAA 11* 11/17/2015 0356   GFRAA 11* 05/31/2014 0455   GFRAA 13* 07/16/2013 0420   CBC    Component Value Date/Time   WBC 14.0* 11/17/2015 0356   WBC 17.4* 05/31/2014 0455   RBC 3.69* 11/17/2015 0356   RBC 3.69* 05/31/2014 0455   HGB 9.7* 11/17/2015 0356   HGB 11.3* 05/31/2014 1130   HCT 31.9* 11/17/2015 0356   HCT 35.0 05/31/2014 0455   PLT 279 11/17/2015 0356   PLT 282 05/31/2014 0455   MCV 86.4 11/17/2015 0356   MCV 95 05/31/2014 0455   MCH 26.3 11/17/2015 0356   MCH 31.7 05/31/2014 0455   MCHC 30.4 11/17/2015 0356   MCHC 33.4 05/31/2014 0455   RDW 19.7* 11/17/2015 0356   RDW 12.0 05/31/2014 0455   LYMPHSABS 3.8 11/17/2015 0356   LYMPHSABS 2.1 05/31/2014 0455   MONOABS 1.6* 11/17/2015 0356   MONOABS 0.9 05/31/2014 0455   EOSABS 0.2 11/17/2015 0356   EOSABS 0.0 05/31/2014 0455   BASOSABS 0.0 11/17/2015 0356   BASOSABS 0 05/31/2014 0801   BASOSABS 0.0 05/31/2014 0455     Assessment: 1. Seizures 2. Anemia on aranesp 3. ESRD on CCPD. On 1.5% for all exchanges and pulling about 470cc yest 4. Hypokalemia Plan: 1. Cont CCPD 2. Replace K   Sherri Davidson T

## 2015-11-17 NOTE — Progress Notes (Signed)
[place] TEAM 1 - Stepdown/ICU TEAM  Sherri Davidson  DSK:[id number] DOB: 08/13/1955 DOA: 11/08/2015 PCP: [facility]    Brief Narrative:  60 y.o. female with history of CVA, HTN, CHF, hypothyroidism, DM2, and ESRD on peritoneal hemodialysis daily who presented with seizures.  Around 1pm the patient was noted to have her first seizure that lasted 5-6 minutes with generalized tonic-clonic movements. EMS was called and patient was able to return to baseline, at which time she refused transport to the hospital. Around 2:18 PM she had a second seizure with acute left-sided facial drooping for which EMS was again called. Patient was very lethargic and altered after her seizing stoped. In the ambulance the patient had a third seizure. Patient was recently taken off of [place] by her Neurologist (Dr. Melrose Nakayama at [facility]). Dilantin was stopped in 06/2015 after the patient had repeated falls. Her last prior seizure activity was 05/2015.  In the ED CT and MRI of the brain showed previous right sided CVA, but no acute abnormalities. UA was positive. UDS negative. Patient was evaluated by Neurology who recommended continuation of Keppra 500 mg daily and Dilantin 100 mg 3 times a day.   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 Encephalopathy appears to be improving - dilatin dose has been decreased and is being given only at night -  ammonia normal - B12 and folate are not low - MRI this admit w/o acute findings   Coffee ground emesis  Began 6/1 - heparin products stopped - pt on PPI - GI evaluated and did not feel EGD was indicated - had another episode today, but Hgb is stable - follow    Dysphagia SLP has evaluated and suggests D1 diet w/ nectar liquids   End-stage renal disease on peritoneal dialysis PD per Nephrology - pt will NOT be a SNF candidate as long as she is on PD - only option for SNF placement will be to convert to HD - presently  the pt's mental status prohibits [place] from having this discussion w/ her - Palliative Care consulted to help establish goals of care  Possible Urinary tract infection Urine was abnormal, but unclear how often pt urinates/if she makes regular urine - completed 4 days of abx tx    DM2 CBG variable - follow w/o change today   Hyperkalemia > Hypokalemia Resolved w/ resumption of PD - now hypokalemic - gently replace and follow    Pressure ulcer v/s MASD See WOC note for wound info and tx recs   RLL lung mass vs Atelectasis vs Pneumonia -Per Ms.Sherri Davidson sister did not want any further workup.  Acute R hand swelling - IV dilantin infiltration - SVT of R arm Arterial dopplers suggest adequate flow, but noted superficial thrombus in R cephalic and basilic veins - no indication for interventions per Vasc Surg or Hand Surgery - follow clinically   MRSA screen +  DVT prophylaxis: SCDs Code Status: FULL CODE Family Communication: no family present at time of exam  Disposition Plan: SDU until mental status improved   Consultants:  Nephrology  Neurology Palliative Care Vasc Surgery  Hand Surgery    Procedures: none  Antimicrobials:  Ceftriaxone 5/27 > 5/30  Subjective: The patient is much more alert than when I was caring for her last week.  She is interactive but not entirely oriented.  She denies chest pain shortness breath fevers chills nausea or vomiting and just tells me she wants  to go home.  Objective: Blood pressure 101/70, pulse 93, temperature 97.4 F (36.3 C), temperature source Oral, resp. rate 22, height '5\' 6"'$  (1.676 m), weight 58.8 kg (129 lb 10.1 oz), SpO2 96 %.  Intake/Output Summary (Last 24 hours) at 11/17/15 1545 Last data filed at 11/17/15 1400  Gross per 24 hour  Intake   1770 ml  Output      0 ml  Net   1770 ml   Filed Weights   11/15/15 0400 11/16/15 0410 11/17/15 0500  Weight: 53.1 kg (117 lb 1 oz) 56.2 kg (123 lb 14.4 oz) 58.8 kg (129 lb 10.1 oz)      Examination: General: No acute respiratory distress - alert but confused and agitated   Lungs: Clear to auscultation bilaterally - no wheeze  Cardiovascular: Regular rate and rhythm - no M or rub  Abdomen: Nondistended, soft, bowel sounds positive, PD cath insertion site clean and dry  Extremities: No significant cyanosis, clubbing, or edema bilateral lower extremities - R arm dusky   CBC:  Recent Labs Lab 11/14/15 0501  11/14/15 1140 11/15/15 0240 11/16/15 0634 11/17/15 0356 11/17/15 1441  WBC 15.2*  --   --  14.8* 13.8* 14.0* 11.8*  NEUTROABS 11.9*  --   --  10.5* 9.6* 8.4*  --   HGB 12.6  < > 12.6 11.4* 11.0* 9.7* 10.4*  HCT 42.1  < > 41.7 38.7 37.1 31.9* 34.0*  MCV 87.9  --   --  88.4 87.7 86.4 88.8  PLT 358  --   --  330 292 279 261  < > = values in this interval not displayed. Basic Metabolic Panel:  Recent Labs Lab 11/11/15 0447 11/12/15 0316 11/13/15 0340 11/14/15 0501 11/15/15 0240 11/16/15 0634 11/17/15 0356  NA 139 141 142 139  140 139 135 137  K 4.2 2.8* 3.7 3.7  3.7 3.6 3.2* 3.1*  CL 106 105 105 101  101 99* 98* 100*  CO2 19* 24 21* '25  23 27 24 24  '$ GLUCOSE 136* 147* 82 194*  187* 75 95 48*  BUN 22* 18 19 21*  21* 23* 18 20  CREATININE 4.82* 4.89* 4.81* 5.11*  4.93* 5.28* 4.76* 4.74*  CALCIUM 8.5* 8.6* 9.1 9.0  9.0 9.0 8.3* 7.9*  MG  --  2.0  --  1.9 1.8 1.6* 2.2  PHOS 6.1*  --  6.1* 6.2*  6.1* 6.2*  --  5.9*   GFR: Estimated Creatinine Clearance: 11.9 mL/min (by C-G formula based on Cr of 4.74).  Liver Function Tests:  Recent Labs Lab 11/11/15 0447 11/13/15 0340 11/14/15 0501 11/15/15 0240 11/17/15 0356  AST  --   --  '19 18 18  '$ ALT  --   --  10* 8* 10*  ALKPHOS  --   --  92 83 77  BILITOT  --   --  0.3 0.2* 0.4  PROT  --   --  7.5 6.8 5.7*  ALBUMIN 2.1* 2.3* 2.2*  2.1* 1.9* 1.7*    Recent Labs Lab 11/14/15 0501  AMMONIA 20   HbA1C: HEMOGLOBIN A1C  Date/Time Value Ref Range Status  05/31/2014 04:55 AM 8.0* 4.2-6.3  % Final    Comment:    The American Diabetes Association recommends that a primary goal of therapy should be <7% and that physicians should reevaluate the treatment regimen in patients with HbA1c values consistently >8%.    HGB A1C MFR BLD  Date/Time Value Ref Range Status  11/12/2015 03:20  AM 5.6 4.8 - 5.6 % Final    Comment:    (NOTE)         Pre-diabetes: 5.7 - 6.4         Diabetes: >6.4         Glycemic control for adults with diabetes: <7.0     CBG:  Recent Labs Lab 11/16/15 2311 11/17/15 0459 11/17/15 0628 11/17/15 0857 11/17/15 1222  GLUCAP 160* 67 72 130* 212*    Recent Results (from the past 240 hour(s))  MRSA PCR Screening     Status: Abnormal   Collection Time: 11/08/15  9:58 PM  Result Value Ref Range Status   MRSA by PCR POSITIVE (A) NEGATIVE Final    Comment:        The GeneXpert MRSA Assay (FDA approved for NASAL specimens only), is one component of a comprehensive MRSA colonization surveillance program. It is not intended to diagnose MRSA infection nor to guide or monitor treatment for MRSA infections. RESULT CALLED TO, READ BACK BY AND VERIFIED WITH: [name],[name] [phone] 11/09/15 MITCHELL,L   C difficile quick scan w PCR reflex     Status: None   Collection Time: 11/12/15 10:47 PM  Result Value Ref Range Status   C Diff antigen NEGATIVE NEGATIVE Final   C Diff toxin NEGATIVE NEGATIVE Final   C Diff interpretation Negative for toxigenic C. difficile  Final  Culture, blood (routine x 2)     Status: None (Preliminary result)   Collection Time: 11/15/15  2:47 PM  Result Value Ref Range Status   Specimen Description BLOOD RIGHT WRIST  Final   Special Requests IN PEDIATRIC BOTTLE 1.5 CC  Final   Culture NO GROWTH 2 DAYS  Final   Report Status PENDING  Incomplete  Culture, blood (routine x 2)     Status: None (Preliminary result)   Collection Time: 11/15/15  2:51 PM  Result Value Ref Range Status   Specimen Description BLOOD RIGHT WRIST  Final    Special Requests IN PEDIATRIC BOTTLE 1.5CC  Final   Culture NO GROWTH 2 DAYS  Final   Report Status PENDING  Incomplete     Scheduled Meds: . sodium chloride   Intravenous Once  . darbepoetin (ARANESP) injection - NON-DIALYSIS  60 mcg Subcutaneous Q Mon-1800  . feeding supplement  1 Container Oral TID BM  . gentamicin cream  1 application Topical Daily  . insulin aspart  0-15 Units Subcutaneous Q4H  . levETIRAcetam  500 mg Oral BID  . levothyroxine  50 mcg Oral QAC breakfast  . multivitamin  1 tablet Oral QHS  . ondansetron (ZOFRAN) IV  4 mg Intravenous Q6H  . pantoprazole  40 mg Oral BID  . phenytoin  100 mg Oral QHS  . potassium chloride  20 mEq Oral BID     LOS: 9 days   Time spent: 35 minutes   Cherene Altes, MD [facility] Office  [phone] Pager - Text Page per Amion as per below:  On-Call/Text Page:      [email or web address]      password [email or web address]  If 7PM-7AM, please contact night-coverage [email or web address] Password [email or web address] 11/17/2015, 3:45 PM

## 2015-11-18 DIAGNOSIS — I999 Unspecified disorder of circulatory system: Secondary | ICD-10-CM

## 2015-11-18 DIAGNOSIS — I998 Other disorder of circulatory system: Secondary | ICD-10-CM | POA: Diagnosis present

## 2015-11-18 DIAGNOSIS — E43 Unspecified severe protein-calorie malnutrition: Secondary | ICD-10-CM

## 2015-11-18 LAB — RENAL FUNCTION PANEL
ALBUMIN: 1.8 g/dL — AB (ref 3.5–5.0)
ANION GAP: 12 (ref 5–15)
BUN: 20 mg/dL (ref 6–20)
CALCIUM: 8.1 mg/dL — AB (ref 8.9–10.3)
CO2: 24 mmol/L (ref 22–32)
Chloride: 98 mmol/L — ABNORMAL LOW (ref 101–111)
Creatinine, Ser: 4.5 mg/dL — ABNORMAL HIGH (ref 0.44–1.00)
GFR calc non Af Amer: 10 mL/min — ABNORMAL LOW (ref >60)
GFR, EST AFRICAN AMERICAN: 11 mL/min — AB (ref >60)
Glucose, Bld: 159 mg/dL — ABNORMAL HIGH (ref 65–99)
PHOSPHORUS: 6 mg/dL — AB (ref 2.5–4.6)
POTASSIUM: 3.1 mmol/L — AB (ref 3.5–5.1)
SODIUM: 134 mmol/L — AB (ref 135–145)

## 2015-11-18 LAB — CBC WITH DIFFERENTIAL/PLATELET
BASOS PCT: 0 %
Basophils Absolute: 0 10*3/uL (ref 0.0–0.1)
EOS ABS: 0.2 10*3/uL (ref 0.0–0.7)
Eosinophils Relative: 1 %
HEMATOCRIT: 32.3 % — AB (ref 36.0–46.0)
HEMOGLOBIN: 10.1 g/dL — AB (ref 12.0–15.0)
LYMPHS ABS: 2.5 10*3/uL (ref 0.7–4.0)
Lymphocytes Relative: 24 %
MCH: 27.4 pg (ref 26.0–34.0)
MCHC: 31.3 g/dL (ref 30.0–36.0)
MCV: 87.5 fL (ref 78.0–100.0)
MONOS PCT: 11 %
Monocytes Absolute: 1.1 10*3/uL — ABNORMAL HIGH (ref 0.1–1.0)
NEUTROS PCT: 64 %
Neutro Abs: 6.8 10*3/uL (ref 1.7–7.7)
Platelets: 241 10*3/uL (ref 150–400)
RBC: 3.69 MIL/uL — AB (ref 3.87–5.11)
RDW: 19.1 % — ABNORMAL HIGH (ref 11.5–15.5)
WBC: 10.7 10*3/uL — AB (ref 4.0–10.5)

## 2015-11-18 LAB — GLUCOSE, CAPILLARY
GLUCOSE-CAPILLARY: 127 mg/dL — AB (ref 65–99)
GLUCOSE-CAPILLARY: 134 mg/dL — AB (ref 65–99)
Glucose-Capillary: 255 mg/dL — ABNORMAL HIGH (ref 65–99)

## 2015-11-18 MED ORDER — POTASSIUM CHLORIDE CRYS ER 20 MEQ PO TBCR
20.0000 meq | EXTENDED_RELEASE_TABLET | Freq: Three times a day (TID) | ORAL | Status: DC
  Administered 2015-11-18 – 2015-11-19 (×6): 20 meq via ORAL
  Filled 2015-11-18 (×6): qty 1

## 2015-11-18 MED ORDER — MIDODRINE HCL 5 MG PO TABS
5.0000 mg | ORAL_TABLET | Freq: Three times a day (TID) | ORAL | Status: DC
Start: 2015-11-18 — End: 2015-11-21
  Administered 2015-11-18 – 2015-11-21 (×10): 5 mg via ORAL
  Filled 2015-11-18 (×10): qty 1

## 2015-11-18 MED ORDER — SODIUM CHLORIDE 0.9 % IV BOLUS (SEPSIS)
250.0000 mL | Freq: Once | INTRAVENOUS | Status: AC
  Administered 2015-11-18: 250 mL via INTRAVENOUS

## 2015-11-18 NOTE — Progress Notes (Signed)
PROGRESS NOTE    Sherri Davidson  MLY:[id number] DOB: [birth year] DOA: 11/08/2015 PCP: [facility]   Brief Narrative:  60 y.o. WF PMHx CVA, HTN, CHF, hypothyroidism, DM2, and ESRD on peritoneal hemodialysis daily, gastric ulcer 2011 following EGD/colonoscopy performed in [place] by a [place].   who presented with seizures. Around 1pm the patient was noted to have her first seizure that lasted 5-6 minutes with generalized tonic-clonic movements. EMS was called and patient was able to return to baseline, at which time she refused transport to the hospital. Around 2:18 PM she had a second seizure with acute left-sided facial drooping for which EMS was again called. Patient was very lethargic and altered after her seizing stoped. In the ambulance the patient had a third seizure. Patient was recently taken off of [place] by her Neurologist (Dr. Melrose Nakayama at [facility]). Dilantin was stopped in 06/2015 after the patient had repeated falls. Her last prior seizure activity was 05/2015.  In the ED CT and MRI of the brain showed previous right sided CVA, but no acute abnormalities. UA was positive. UDS negative. Patient was evaluated by Neurology who recommended continuation of Keppra 500 mg daily and Dilantin 100 mg 3 times a day.    Assessment & Plan:   Principal Problem:   Seizures ([place]) Active Problems:   Pressure ulcer   UTI (lower urinary tract infection)   Acute encephalopathy   Hyperkalemia   Todd's paralysis ([name])   ESRD on peritoneal dialysis ([place])   Hallucinations   Dysphagia   Controlled diabetes mellitus type 2 with complications ([name])   Coffee ground emesis   Palliative care encounter   Goals of care, counseling/discussion   Arterial occlusion ([name])   Stroke (cerebrum) ([name])   Rectal bleeding   Severe malnutrition ([name])   Ischemia of hand    Seizures / Todd's paralysis / acute encephalopathy -Per her sister/caregiver/POA Ms.Jaunita Ogren  patient usually is A/O 4 at home. Also states patient's primary neurologist had taken patient off of Dilantin secondary to increased lethargy. -5/31 Dilantin level = 11.7  -Decreased Dilantin 100 mg daily per neurology recommendation -Continue Keppra 500 mg daily -Per Neurology note 6/5 patient neurologically stable; Discharge patient on current medication -Ambulate patient in the hall q shift  CVA -CT and MRI showed multiple old infarcts. Sister aware  Dysphagia -5/30 Passed swallow evaluation; dysphagia 1 nectar thick liquid    Possible Urinary tract infection -Urine is abnormal, but unclear how often pt urinates/if she makes regular urine  -Completed 4 days of empiric antibiotics; repeat cultures negative   End-stage renal disease on peritoneal dialysis -PD per Nephrology  -Spoke at length with [place] nephrology, who spoke spoke with her regular nephrologist. Continue home PD. Patient not good candidate for HD.  -Strict in and out since admission - 1.9 L -Daily weight  Filed Weights   11/16/15 0410 11/17/15 0500 11/18/15 0434  Weight: 56.2 kg (123 lb 14.4 oz) 58.8 kg (129 lb 10.1 oz) 57.3 kg (126 lb 5.2 oz)     Chronic hypotension -Midodrin 5 mg TID -6/6 bolus normal saline 250 ml  DM type 2 controlled with complication -8/12 Hemoglobin A1c= 5.6 -Lipid panel; within ADA guideline -Moderate SSI  Hypokalemia -Manage per PD  Hypomagnesemia  -Resolved  Pressure ulcer v/s MASD See WOC note for wound info and tx recs  Purple glove syndrome (PGS) -Patient initial dose of IV Dilantin infiltrated. IV administration immediately discontinued and IV removed. -Over last 48 hours  patient has elevated hand and had warm compresses applied. Unfortunately has continued to progress.  -Continue to elevate hand, apply warm compress as tolerated obtain right upper extremity angiogram -Per vascular surgery/orthopedic hand surgery no intervention at this time.  Positive  SVT RUE -Patient with SVT multiple vessels see results below. Unable to anticoagulate secondary to GI bleed  Coffee-ground emesis/Hx Gastric ulcer -Discontinue heparin products -6/1 @0041  Gastroccult card. Positive-  Recent Labs Lab 11/15/15 0240 11/16/15 0634 11/17/15 0356 11/17/15 1441 11/18/15 0327  HGB 11.4* 11.0* 9.7* 10.4* 10.1*  -Transfuse for hemoglobin<7 -6/2 consulted [facility]; would not perform EGD at this time secondary to patient's multiple medical problems and stable H/H  RLL lung mass vs Atelectasis vs Pneumonia -Per Ms.Jaunita Venters sister did not want any further workup. -Per new 2017 Fleischner society guidelines for Solitary nodule size: >8 mm, in either low or high risk patients Follow-up CT at 3 months and/or CT-PET, and/or biopsy. Patient would require new chest CT on/near July 11      Goals of care -Will attempt to contact patient's sister/caregiver/POA Ms.Jaunita Tibbits who is on vacation I/O discuss when we can have meeting to discuss short-term/long-term goals care -6/2; Ms.Jaunita Desta (sister) met with Palliative Care PA Melton Alar and insisted that patient symptoms secondary to her being on Dilantin. Neurology contacted requesting Dilantin be DC'd. Will await their recommendation -Spoke with Dr. Durenda Guthrie Medical Director of [facility] would like to be kept in the loop on plan for Ms.Job Cell [phone]    DVT prophylaxis: SCD Code Status: Full Family Communication: None present; family meeting scheduled for 15 today  Disposition Plan: Home the next 24-48 hrs.   Consultants:  Dr.James Deterding Nephrology  Neurology  Dr.Malcolm T Fuller [facility]  Dr. Ruta Hinds vascular surgery Dr.[facility] orthopedic hand surgery   Procedures/Significant Events:  4/11 CT chest WO contrast;-Oval area of dense consolidation in the posterior, inferior RLL to my: Rounded PNA vs rounded atelectasis vs underlying mass - Small to moderate Rt  pleural effusion. -Stable 3 mm benign subpleural nodule LUL -Large calcified disc herniation at the T9-10 level, causing moderate canal stenosis and probable cord compression on the right. 5/27 CT head WO contrast;-Changes of prior infarcts are noted in the distribution of the right middle cerebral artery. -Lacunar infarct again identified on the right in the basal ganglia. No acute hemorrhage, acute infarction or space-occupying mass lesion.  5/27 MRA head;-Findings suspicious for prominent intracranial atherosclerotic changes including significant stenosis left internal carotid artery cavernous segment, significant posterior cerebral artery narrowing greater on the left and decrease number of visualized right middle cerebral artery branch vessels C/W remote right hemispheric infarct. 5/27 MRI brain;-Remote large right hemispheric infarct with encephalomalacia right frontal lobe, parietal lobe and temporal lobe with subsequent dilation right temporal horn. -Remote infarct right corona radiata/lenticular nucleus. Remote small infarct left caudate.Remote small infarct left thalamus. 6/4 RUE arterial duplex; Thrombus vs mild stenosis axillary artery?. -Positive SVT throughout Rt Cephalic vein from forearm to upper arm as well as in the basilic vein from mid upper arm to just before the axilla.    Cultures 5/27 MRSA by PCR positive 6/3 urine pending 6/3 blood right wrist 2 NGTD  Antimicrobials: Ceftriaxone 5/27 > 5/30   Devices  LINES / TUBES:      Continuous Infusions: . sodium chloride 10 mL/hr at 11/17/15 1200     Subjective:  6/6  A/O  1, (does not know where, when, why), combative does not want  to allow physical exam. Requests cigarettes.    Objective: Filed Vitals:   11/18/15 0810 11/18/15 0908 11/18/15 0920 11/18/15 1107  BP: 98/80 84/66 90/75  71/51  Pulse: 77 88 95   Temp: 98.7 F (37.1 C)     TempSrc: Oral     Resp: 19 16 22    Height:      Weight:      SpO2:  96%  100%     Intake/Output Summary (Last 24 hours) at 11/18/15 1150 Last data filed at 11/18/15 0900  Gross per 24 hour  Intake    910 ml  Output      0 ml  Net    910 ml   Filed Weights   11/16/15 0410 11/17/15 0500 11/18/15 0434  Weight: 56.2 kg (123 lb 14.4 oz) 58.8 kg (129 lb 10.1 oz) 57.3 kg (126 lb 5.2 oz)    Examination: General:A/O  1, (does not know where, when, why), combative cachectic No acute respiratory distress Eyes: negative scleral hemorrhage, negative anisocoria, negative icterus ENT: Negative Runny nose, negative gingival bleeding, Neck:  Negative scars, masses, torticollis, lymphadenopathy, JVD Lungs: Clear to auscultation bilaterally without wheezes or crackles Cardiovascular: Regular rate and rhythm without murmur gallop or rub normal S1 and S2 Abdomen: negative abdominal pain, nondistended, positive soft, bowel sounds, no rebound, no ascites, no appreciable mass Extremities: Patient with previous mild cyanosis right upper extremity (infiltration Dilantin 1 dose), RUE cyanosis, swelling; now extending past elbow (improved last 48 hours). Fingers swelling has decreased.  Skin: Negative rashes, lesions, blisters right hand  Psychiatric:  Negative hallucinations, negative depression, negative anxiety  Central nervous system:  Moves all extremities, strength 5/5 all extremities, sensation intact RUE painful palpation, negative dysarthria, negative expressive aphasia, negative receptive aphasia.  .     Data Reviewed: Care during the described time interval was provided by me .  I have reviewed this patient's available data, including medical history, events of note, physical examination, and all test results as part of my evaluation. I have personally reviewed and interpreted all radiology studies.  CBC:  Recent Labs Lab 11/14/15 0501  11/15/15 0240 11/16/15 0634 11/17/15 0356 11/17/15 1441 11/18/15 0327  WBC 15.2*  --  14.8* 13.8* 14.0* 11.8* 10.7*    NEUTROABS 11.9*  --  10.5* 9.6* 8.4*  --  6.8  HGB 12.6  < > 11.4* 11.0* 9.7* 10.4* 10.1*  HCT 42.1  < > 38.7 37.1 31.9* 34.0* 32.3*  MCV 87.9  --  88.4 87.7 86.4 88.8 87.5  PLT 358  --  330 292 279 261 241  < > = values in this interval not displayed. Basic Metabolic Panel:  Recent Labs Lab 11/12/15 0316 11/13/15 0340 11/14/15 0501 11/15/15 0240 11/16/15 0634 11/17/15 0356 11/18/15 0327  NA 141 142 139  140 139 135 137 134*  K 2.8* 3.7 3.7  3.7 3.6 3.2* 3.1* 3.1*  CL 105 105 101  101 99* 98* 100* 98*  CO2 24 21* 25  23 27 24 24 24   GLUCOSE 147* 82 194*  187* 75 95 48* 159*  BUN 18 19 21*  21* 23* 18 20 20   CREATININE 4.89* 4.81* 5.11*  4.93* 5.28* 4.76* 4.74* 4.50*  CALCIUM 8.6* 9.1 9.0  9.0 9.0 8.3* 7.9* 8.1*  MG 2.0  --  1.9 1.8 1.6* 2.2  --   PHOS  --  6.1* 6.2*  6.1* 6.2*  --  5.9* 6.0*   GFR: Estimated Creatinine Clearance: 12.2 mL/min (by  C-G formula based on Cr of 4.5). Liver Function Tests:  Recent Labs Lab 11/13/15 0340 11/14/15 0501 11/15/15 0240 11/17/15 0356 11/18/15 0327  AST  --  19 18 18   --   ALT  --  10* 8* 10*  --   ALKPHOS  --  92 83 77  --   BILITOT  --  0.3 0.2* 0.4  --   PROT  --  7.5 6.8 5.7*  --   ALBUMIN 2.3* 2.2*  2.1* 1.9* 1.7* 1.8*   No results for input(s): LIPASE, AMYLASE in the last 168 hours.  Recent Labs Lab 11/14/15 0501  AMMONIA 20   Coagulation Profile: No results for input(s): INR, PROTIME in the last 168 hours. Cardiac Enzymes: No results for input(s): CKTOTAL, CKMB, CKMBINDEX, TROPONINI in the last 168 hours. BNP (last 3 results) No results for input(s): PROBNP in the last 8760 hours. HbA1C: No results for input(s): HGBA1C in the last 72 hours. CBG:  Recent Labs Lab 11/17/15 0857 11/17/15 1222 11/17/15 1733 11/17/15 2002 11/18/15 0808  GLUCAP 130* 212* 129* 96 127*   Lipid Profile: No results for input(s): CHOL, HDL, LDLCALC, TRIG, CHOLHDL, LDLDIRECT in the last 72 hours. Thyroid Function  Tests: No results for input(s): TSH, T4TOTAL, FREET4, T3FREE, THYROIDAB in the last 72 hours. Anemia Panel: No results for input(s): VITAMINB12, FOLATE, FERRITIN, TIBC, IRON, RETICCTPCT in the last 72 hours. Urine analysis:    Component Value Date/Time   COLORURINE YELLOW 11/08/2015 1707   COLORURINE Yellow 05/30/2014 1618   APPEARANCEUR TURBID* 11/08/2015 1707   APPEARANCEUR Clear 05/30/2014 1618   LABSPEC 1.018 11/08/2015 1707   LABSPEC 1.009 05/30/2014 1618   PHURINE 7.5 11/08/2015 1707   PHURINE 7.0 05/30/2014 1618   GLUCOSEU 100* 11/08/2015 1707   GLUCOSEU >=500 05/30/2014 1618   HGBUR LARGE* 11/08/2015 1707   HGBUR Negative 05/30/2014 1618   BILIRUBINUR NEGATIVE 11/08/2015 1707   BILIRUBINUR Negative 05/30/2014 1618   KETONESUR NEGATIVE 11/08/2015 1707   KETONESUR Negative 05/30/2014 1618   PROTEINUR >300* 11/08/2015 1707   PROTEINUR 100 mg/dL 05/30/2014 1618   NITRITE NEGATIVE 11/08/2015 1707   NITRITE Negative 05/30/2014 1618   LEUKOCYTESUR LARGE* 11/08/2015 1707   LEUKOCYTESUR Negative 05/30/2014 1618   Sepsis Labs: @LABRCNTIP (procalcitonin:4,lacticidven:4)  ) Recent Results (from the past 240 hour(s))  MRSA PCR Screening     Status: Abnormal   Collection Time: 11/08/15  9:58 PM  Result Value Ref Range Status   MRSA by PCR POSITIVE (A) NEGATIVE Final    Comment:        The GeneXpert MRSA Assay (FDA approved for NASAL specimens only), is one component of a comprehensive MRSA colonization surveillance program. It is not intended to diagnose MRSA infection nor to guide or monitor treatment for MRSA infections. RESULT CALLED TO, READ BACK BY AND VERIFIED WITH: [name],[name] [phone] 11/09/15 MITCHELL,L   C difficile quick scan w PCR reflex     Status: None   Collection Time: 11/12/15 10:47 PM  Result Value Ref Range Status   C Diff antigen NEGATIVE NEGATIVE Final   C Diff toxin NEGATIVE NEGATIVE Final   C Diff interpretation Negative for toxigenic C. difficile   Final  Culture, blood (routine x 2)     Status: None (Preliminary result)   Collection Time: 11/15/15  2:47 PM  Result Value Ref Range Status   Specimen Description BLOOD RIGHT WRIST  Final   Special Requests IN PEDIATRIC BOTTLE 1.5 CC  Final   Culture NO  GROWTH 2 DAYS  Final   Report Status PENDING  Incomplete  Culture, blood (routine x 2)     Status: None (Preliminary result)   Collection Time: 11/15/15  2:51 PM  Result Value Ref Range Status   Specimen Description BLOOD RIGHT WRIST  Final   Special Requests IN PEDIATRIC BOTTLE 1.5CC  Final   Culture NO GROWTH 2 DAYS  Final   Report Status PENDING  Incomplete         Radiology Studies: No results found.      Scheduled Meds: . darbepoetin (ARANESP) injection - NON-DIALYSIS  60 mcg Subcutaneous Q Mon-1800  . feeding supplement  1 Container Oral TID BM  . gentamicin cream  1 application Topical Daily  . insulin aspart  0-15 Units Subcutaneous TID WC  . insulin aspart  0-5 Units Subcutaneous QHS  . levETIRAcetam  500 mg Oral BID  . levothyroxine  50 mcg Oral QAC breakfast  . midodrine  5 mg Oral TID WC  . multivitamin  1 tablet Oral QHS  . ondansetron (ZOFRAN) IV  4 mg Intravenous Q6H  . pantoprazole  40 mg Oral BID  . phenytoin  100 mg Oral QHS  . potassium chloride  20 mEq Oral TID   Continuous Infusions: . sodium chloride 10 mL/hr at 11/17/15 1200     LOS: 10 days    Time spent: 40 minutes    WOODS, Geraldo Docker, MD [facility] Pager [phone]   If 7PM-7AM, please contact night-coverage [email or web address] Password [email or web address] 11/18/2015, 11:50 AM

## 2015-11-18 NOTE — Progress Notes (Signed)
S: No new CO.  Hungry and wants to eat O:BP 98/80 mmHg  Pulse 77  Temp(Src) 98.7 F (37.1 C) (Oral)  Resp 19  Ht '5\' 6"'$  (1.676 m)  Wt 57.3 kg (126 lb 5.2 oz)  BMI 20.40 kg/m2  SpO2 96%  Intake/Output Summary (Last 24 hours) at 11/18/15 0820 Last data filed at 11/18/15 0200  Gross per 24 hour  Intake    880 ml  Output      0 ml  Net    880 ml   Weight change: -1.5 kg (-3 lb 4.9 oz) Gen: Awake and alert CVS: RRR Resp: Decreased BS bases Abd: + BS NT ND.  PD exit site clean, no drainage Ext: No edema except Rt arm with mild swelling and erythema NEURO: CNI Ox2 (person and place)   . darbepoetin (ARANESP) injection - NON-DIALYSIS  60 mcg Subcutaneous Q Mon-1800  . feeding supplement  1 Container Oral TID BM  . gentamicin cream  1 application Topical Daily  . insulin aspart  0-15 Units Subcutaneous TID WC  . insulin aspart  0-5 Units Subcutaneous QHS  . levETIRAcetam  500 mg Oral BID  . levothyroxine  50 mcg Oral QAC breakfast  . multivitamin  1 tablet Oral QHS  . ondansetron (ZOFRAN) IV  4 mg Intravenous Q6H  . pantoprazole  40 mg Oral BID  . phenytoin  100 mg Oral QHS  . potassium chloride  20 mEq Oral BID   No results found. BMET    Component Value Date/Time   NA 134* 11/18/2015 0327   NA 135* 05/31/2014 0455   K 3.1* 11/18/2015 0327   K 3.0* 05/31/2014 0455   CL 98* 11/18/2015 0327   CL 98 05/31/2014 0455   CO2 24 11/18/2015 0327   CO2 21 05/31/2014 0455   GLUCOSE 159* 11/18/2015 0327   GLUCOSE 223* 05/31/2014 0455   BUN 20 11/18/2015 0327   BUN 35* 05/31/2014 0455   CREATININE 4.50* 11/18/2015 0327   CREATININE 5.19* 05/31/2014 0455   CALCIUM 8.1* 11/18/2015 0327   CALCIUM 9.2 05/31/2014 0455   GFRNONAA 10* 11/18/2015 0327   GFRNONAA 9* 05/31/2014 0455   GFRNONAA 11* 07/16/2013 0420   GFRAA 11* 11/18/2015 0327   GFRAA 11* 05/31/2014 0455   GFRAA 13* 07/16/2013 0420   CBC    Component Value Date/Time   WBC 10.7* 11/18/2015 0327   WBC 17.4*  05/31/2014 0455   RBC 3.69* 11/18/2015 0327   RBC 3.69* 05/31/2014 0455   HGB 10.1* 11/18/2015 0327   HGB 11.3* 05/31/2014 1130   HCT 32.3* 11/18/2015 0327   HCT 35.0 05/31/2014 0455   PLT 241 11/18/2015 0327   PLT 282 05/31/2014 0455   MCV 87.5 11/18/2015 0327   MCV 95 05/31/2014 0455   MCH 27.4 11/18/2015 0327   MCH 31.7 05/31/2014 0455   MCHC 31.3 11/18/2015 0327   MCHC 33.4 05/31/2014 0455   RDW 19.1* 11/18/2015 0327   RDW 12.0 05/31/2014 0455   LYMPHSABS 2.5 11/18/2015 0327   LYMPHSABS 2.1 05/31/2014 0455   MONOABS 1.1* 11/18/2015 0327   MONOABS 0.9 05/31/2014 0455   EOSABS 0.2 11/18/2015 0327   EOSABS 0.0 05/31/2014 0455   BASOSABS 0.0 11/18/2015 0327   BASOSABS 0 05/31/2014 0801   BASOSABS 0.0 05/31/2014 0455     Assessment: 1. Seizures 2. Anemia on aranesp 3. ESRD on CCPD. On 1.5% for all exchanges.  No UF yet recorded for today 4. Hypokalemia, on replacement Plan: 1.  Cont CCPD 2. Increase K replacement   Campbell Agramonte T

## 2015-11-18 NOTE — Progress Notes (Addendum)
Attempted to call report to [facility]

## 2015-11-18 NOTE — Patient Care Conference (Signed)
Spoke at length with family concerning patient's poor prognosis. Counseled family on multisystem organ failure. 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 Family is ready to take patient home. Will not have equipment in place until afternoon of Thursday. Patient to be discharged Friday morning.  60 minutes spent in direct counseling of family concerning patient care.

## 2015-11-18 NOTE — Consult Note (Signed)
WOC consult requested for hand wound.  Ortho/hand service has consulted for assessment and plan of care and EMR states they plan to follow the patient.  Please refer to their team for further plan of care. Please re-consult if further assistance is needed.  Thank-you,  Julien Girt MSN, [place], [place], [place], [place]

## 2015-11-19 LAB — CBC WITH DIFFERENTIAL/PLATELET
Basophils Absolute: 0 10*3/uL (ref 0.0–0.1)
Basophils Relative: 0 %
EOS ABS: 0.2 10*3/uL (ref 0.0–0.7)
Eosinophils Relative: 3 %
HCT: 30.5 % — ABNORMAL LOW (ref 36.0–46.0)
HEMOGLOBIN: 9.4 g/dL — AB (ref 12.0–15.0)
LYMPHS ABS: 2.2 10*3/uL (ref 0.7–4.0)
LYMPHS PCT: 29 %
MCH: 26.5 pg (ref 26.0–34.0)
MCHC: 30.8 g/dL (ref 30.0–36.0)
MCV: 85.9 fL (ref 78.0–100.0)
MONOS PCT: 12 %
Monocytes Absolute: 1 10*3/uL (ref 0.1–1.0)
NEUTROS PCT: 56 %
Neutro Abs: 4.3 10*3/uL (ref 1.7–7.7)
Platelets: 223 10*3/uL (ref 150–400)
RBC: 3.55 MIL/uL — AB (ref 3.87–5.11)
RDW: 18.6 % — ABNORMAL HIGH (ref 11.5–15.5)
WBC: 7.7 10*3/uL (ref 4.0–10.5)

## 2015-11-19 LAB — RENAL FUNCTION PANEL
Albumin: 1.5 g/dL — ABNORMAL LOW (ref 3.5–5.0)
Anion gap: 12 (ref 5–15)
BUN: 19 mg/dL (ref 6–20)
CALCIUM: 8.3 mg/dL — AB (ref 8.9–10.3)
CHLORIDE: 101 mmol/L (ref 101–111)
CO2: 22 mmol/L (ref 22–32)
Creatinine, Ser: 4.4 mg/dL — ABNORMAL HIGH (ref 0.44–1.00)
GFR calc non Af Amer: 10 mL/min — ABNORMAL LOW (ref >60)
GFR, EST AFRICAN AMERICAN: 12 mL/min — AB (ref >60)
GLUCOSE: 104 mg/dL — AB (ref 65–99)
POTASSIUM: 3.4 mmol/L — AB (ref 3.5–5.1)
Phosphorus: 5.5 mg/dL — ABNORMAL HIGH (ref 2.5–4.6)
SODIUM: 135 mmol/L (ref 135–145)

## 2015-11-19 LAB — GLUCOSE, CAPILLARY
GLUCOSE-CAPILLARY: 225 mg/dL — AB (ref 65–99)
GLUCOSE-CAPILLARY: 111 mg/dL — AB (ref 65–99)
GLUCOSE-CAPILLARY: 151 mg/dL — AB (ref 65–99)
Glucose-Capillary: 96 mg/dL (ref 65–99)

## 2015-11-19 MED ORDER — DELFLEX-LM/1.5% DEXTROSE 346 MOSM/L IP SOLN: Freq: Every day | INTRAPERITONEAL | Status: DC

## 2015-11-19 MED ORDER — HEPARIN 1000 UNIT/ML FOR PERITONEAL DIALYSIS
2500.0000 [IU] | INTRAMUSCULAR | Status: DC | PRN
  Filled 2015-11-19 (×3): qty 2.5

## 2015-11-19 MED ORDER — DELFLEX-LC/1.5% DEXTROSE 346 MOSM/L IP SOLN
Freq: Every day | INTRAPERITONEAL | Status: DC
  Administered 2015-11-20: 23:00:00 via INTRAPERITONEAL

## 2015-11-19 NOTE — Care Management Important Message (Signed)
Important Message  Patient Details  Name: Sherri Davidson MRN: [id number] Date of Birth: [birth year]   Medicare Important Message Given:  Yes    Kentrell Hallahan, Rory Percy, RN 11/19/2015, 8:56 AM

## 2015-11-19 NOTE — Progress Notes (Signed)
Physical Therapy Treatment Patient Details Name: Sherri Davidson MRN: [id number] DOB: 1956-03-13 Today's Date: 11/19/2015    History of Present Illness Pt is a 60 year old Caucasian woman with past medical history significant for end-stage renal disease on peritoneal dialysis (followed by [facility] in [place]) from what appears to be underlying hypertension and diabetes. She also has CVA, hypothyroidism, history of congestive heart failure and seizure disorder. She was brought in after witnessed generalized tonic-clonic seizures and associated right-sided facial droop. She was recently taken off of Keppra possibly because of repeated dizziness and falls.    PT Comments    Continues to require +2 assist for safe stand pivot transfers; Discussed managing at home with Sherri Davidson, and she did tell me she has a hospital bed, wheelchair, and she and her sister use a sliding board for transfers at times; Noting that the dc plan is for home per pt and sister's decision;   Would recommend maximizing equipment and services; Air overlay for bed, ambulance ride home if pt does not have a ramp; drop-arm bsc and sliding board if pt does not already have one  Follow Up Recommendations  Home health PT;Supervision/Assistance - 24 hour (per decision to dc home)     Equipment Recommendations  Other (comment) (Air overlay mattress; Ambulance ride home; drop-arm BSC, sliding board)    Recommendations for Other Services       Precautions / Restrictions Precautions Precautions: Fall;Other (comment) Precaution Comments: seizure    Mobility  Bed Mobility Overal bed mobility: Needs Assistance;+2 for physical assistance Bed Mobility: Rolling;Supine to Sit Rolling: Mod assist;+2 for physical assistance   Supine to sit: Max assist;+2 for physical assistance     General bed mobility comments: Cues for technique, reaching for rail, and looking towards side to which we were rolling; mod assist to  complete roll; assist to clear feet from EOB and elevate trunk to upright sitting  Transfers Overall transfer level: Needs assistance Equipment used: 2 person hand held assist Transfers: Sit to/from [employer] Transfers Sit to Stand: Max assist;+2 physical assistance Stand pivot transfers: Max assist;+2 physical assistance       General transfer comment: Assist to boost and steady.  Pt pivots her feet but requires directing to pivot and assist provided for controlled descent. Continuing cues to initiate with anterior weight shift; close guard and knee block for safety  Ambulation/Gait                 Stairs            Wheelchair Mobility    Modified Rankin (Stroke Patients Only)       Balance     Sitting balance-Leahy Scale: Poor       Standing balance-Leahy Scale: Zero                      Cognition Arousal/Alertness: Awake/alert Behavior During Therapy: Flat affect Overall Cognitive Status: No family/caregiver present to determine baseline cognitive functioning                 General Comments: Tending to drift off to sleep during bed mobility    Exercises      General Comments General comments (skin integrity, edema, etc.): Pt was soiled with loose stool upon our arrival; assisted with hygeine      Pertinent Vitals/Pain Pain Assessment: Faces Faces Pain Scale: Hurts little more Pain Location: R arm Pain Intervention(s): Monitored during session;Repositioned    Home Living  Prior Function            PT Goals (current goals can now be found in the care plan section) Acute Rehab PT Goals Patient Stated Goal: Hopes to go home PT Goal Formulation: Patient unable to participate in goal setting Time For Goal Achievement: 11/24/15 Potential to Achieve Goals: Fair Progress towards PT goals: Progressing toward goals (slow progress)    Frequency  Min 3X/week    PT Plan Discharge plan needs  to be updated    Co-evaluation             End of Session Equipment Utilized During Treatment: Gait belt Activity Tolerance: Patient limited by fatigue;Patient limited by pain Patient left: in chair;with call bell/phone within reach;with chair alarm set;with nursing/sitter in room     Time: 1500-1533 PT Time Calculation (min) (ACUTE ONLY): 33 min  Charges:  $Therapeutic Activity: 23-37 mins                    G Codes:      Sherri Davidson 11/19/2015, 4:23 PM  Sherri Davidson, Sherri Davidson Pager [phone] Office [phone]

## 2015-11-19 NOTE — Progress Notes (Signed)
Speech Language Pathology Treatment: Dysphagia  Patient Details Name: Sherri Davidson MRN: [id number] DOB: 1956/01/19 Today's Date: 11/19/2015 Time: 0850-0905 SLP Time Calculation (min) (ACUTE ONLY): 15 min  Assessment / Plan / Recommendation Clinical Impression  Provided upgraded textures to determine if objective test may be warranted, but pt persistently demonstrates immediate cough when sipping thin liquids, indicating probability of sensed aspiration in setting of right sided pna. Pt also with prolonged mastication and inability to swallow soft fruit with cues or a liquid wash. Discussed results with pts sister on the phone who confirms that pt has been unable to masticate properly since her last CVA in December. Given findings, recommend d/c home with puree diet and nectar thick liquids to reduce risk of aspiration and facilitate intake. Sister is in agreement and SLP discussed instructions for thickening. Pt would benefit from home health SLP to f/u for further assistance and interventions after d/c.    HPI HPI: 60 y.o. female with history of CVA, HTN, CHF, hypothyroidism, DM2, and ESRD on peritoneal hemodialysis daily who presented with seizures.  Dx include seizures/Todd's paralysis/acute encephalopathy; dysphagia (coughing with clear liquids); possible UTI.  CXR Persistent right pleural effusion and right lower lung consolidation/atelectasis.      SLP Plan  Continue with current plan of care     Recommendations  Diet recommendations: Dysphagia 1 (puree);Nectar-thick liquid Liquids provided via: Cup Medication Administration: Whole meds with puree Supervision: Full supervision/cueing for compensatory strategies;Staff to assist with self feeding Compensations: Small sips/bites;Slow rate;Minimize environmental distractions Postural Changes and/or Swallow Maneuvers: Seated upright 90 degrees             Oral Care Recommendations: Oral care BID Plan: Continue with current plan of  care     Sherri Usman Millett, MA CCC-SLP [phone]   Sherri Davidson 11/19/2015, 9:54 AM

## 2015-11-19 NOTE — Progress Notes (Signed)
Physical Therapy Treatment Note  Clinical Impression: Seen for a second time to work on transfers and mobility while assisting pt back to bed for hygiene; More pronounced posterior lean with transfer and sitting EOB today;   I still have concerns about Sherri Davidson and her sister safely managing at home; Sherri Davidson did indicate that she has used a sliding board in the past (would need to verify this);   If she remains inpatient, will plan to  work on sliding board transfers next session.    11/19/15 1629  PT Visit Information  Last PT Received On 11/19/15  Assistance Needed +2  History of Present Illness Pt is a 60 year old Caucasian woman with past medical history significant for end-stage renal disease on peritoneal dialysis (followed by [facility] in [place]) from what appears to be underlying hypertension and diabetes. She also has CVA, hypothyroidism, history of congestive heart failure and seizure disorder. She was brought in after witnessed generalized tonic-clonic seizures and associated right-sided facial droop. She was recently taken off of Keppra possibly because of repeated dizziness and falls.  Subjective Data  Patient Stated Goal Hopes to go home  Precautions  Precautions Fall;Other (comment)  Precaution Comments seizure  Pain Assessment  Pain Assessment Faces  Faces Pain Scale 4  Pain Location R arm  Pain Descriptors / Indicators Discomfort;Grimacing  Pain Intervention(s) Monitored during session;Limited activity within patient's tolerance  Cognition  Arousal/Alertness Awake/alert  Behavior During Therapy Flat affect  Overall Cognitive Status No family/caregiver present to determine baseline cognitive functioning  General Comments Did not indicate she was soiled when RN and I came in to check on chair alarm  Bed Mobility  Overal bed mobility Needs Assistance;+2 for physical assistance  Bed Mobility Sit to Supine  Sit to supine +2 for physical  assistance;Max assist  General bed mobility comments Began to slide towards EOB because of heavy posterior lean while sitting EOB after transfer back to bed; +2 for safety and assistance of LEs onto bed and to ease trunk to bed relatively quickly  Transfers  Overall transfer level Needs assistance  Equipment used 2 person hand held assist  Transfers Stand Pivot Transfers  Stand pivot transfers Max assist;+2 physical assistance  General transfer comment More prominent posterior lean during transfer back to bed than earlier transfer OOB to chair; +2 max for safety  Balance  Sitting balance-Leahy Scale Poor  Standing balance-Leahy Scale Zero  General Comments  General comments (skin integrity, edema, etc.) Assisted pt back to bed with assist of Nurse Tech; was only OOB a short time due to having loose stools while in chair  PT - End of Session  Equipment Utilized During Treatment Gait belt  Activity Tolerance Patient limited by fatigue  Patient left in bed;Other (comment) (With Nurse Tech assisting with clean up)  Nurse Communication Mobility status;Need for lift equipment  PT - Assessment/Plan  PT Plan Current plan remains appropriate;Other (comment) (though I would prefer a post-acute rehab stay)  PT Frequency (ACUTE ONLY) Min 3X/week  Follow Up Recommendations Home health PT;Supervision/Assistance - 24 hour (maximize services)  PT equipment Other (comment) (air overlay, ambualnce, drop-arm 3in1, sliding board)  PT Goal Progression  Progress towards PT goals Progressing toward goals (very slow)  Acute Rehab PT Goals  PT Goal Formulation Patient unable to participate in goal setting  Time For Goal Achievement 11/24/15  Potential to Achieve Goals Fair  PT Time Calculation  PT Start Time (ACUTE ONLY) 1545  PT Stop Time (ACUTE  ONLY) 1555  PT Time Calculation (min) (ACUTE ONLY) 10 min  PT General Charges  $$ ACUTE PT VISIT 1 Procedure  PT Treatments  $Therapeutic Activity 8-22 mins    Roney Marion, PT  Acute Rehabilitation Services Pager [phone] Office [phone]

## 2015-11-19 NOTE — Progress Notes (Signed)
No charge note.  Examined patient at bedside.  She is more coherent - albeit still somewhat confused..  Does not appear to be bleeding.  Her right hand is still purple and swollen with purpura traveling up to her shoulder.  She complains of severe pain in her hand and requests pain medicine.  Patient wants to go home.  Patient and family are insistent that she is a full code and continue aggressive treatment.  PMT will follow peripherally - please re consult [place] if we can be of assistance.  Imogene Burn, [place] [facility] Pager: [phone]

## 2015-11-19 NOTE — Progress Notes (Signed)
[place] TEAM 1 - Stepdown/ICU TEAM  LUCINDY BOREL  FYB:[id number] DOB: [birth year] DOA: 11/08/2015 PCP: [facility]    Brief Narrative:  60 y.o. female with history of CVA, HTN, CHF, hypothyroidism, DM2, and ESRD on peritoneal hemodialysis daily who presented with seizures.  Around 1pm the patient was noted to have her first seizure that lasted 5-6 minutes with generalized tonic-clonic movements. EMS was called and patient was able to return to baseline, at which time she refused transport to the hospital. Around 2:18 PM she had a second seizure with acute left-sided facial drooping for which EMS was again called. Patient was very lethargic and altered after her seizing stoped. In the ambulance the patient had a third seizure. Patient was recently taken off of [place] by her Neurologist (Dr. Melrose Nakayama at [facility]). Dilantin was stopped in 06/2015 after the patient had repeated falls. Her last prior seizure activity was 05/2015.  In the ED CT and MRI of the brain showed previous right sided CVA, but no acute abnormalities. UA was positive. UDS negative. Patient was evaluated by Neurology who recommended continuation of Keppra 500 mg daily and Dilantin 100 mg 3 times a day.   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 Encephalopathy appears to have resolved - dilatin dose has been decreased and is being given only at night -  ammonia normal - B12 and folate are not low - MRI this admit w/o acute findings   Coffee ground emesis  Began 6/1 - heparin products stopped - pt on PPI - GI evaluated and did not feel EGD was indicated - had another episode 6/5, but none since - recheck Hgb in AM  Dysphagia SLP has evaluated and suggests D1 diet w/ nectar liquids - appears to be tolerating thus far  End-stage renal disease on peritoneal dialysis PD per Nephrology - pt will NOT be a SNF candidate as long as she is on PD - only option for SNF  placement will be to convert to HD - Palliative Care consulted to help establish goals of care and pt insisted on remaining full code - pt and her sister (caretaker) desire d/c home - to continue PD at home   Possible Urinary tract infection Urine was abnormal, but unclear how often pt urinates/if she makes regular urine - completed 4 days of abx tx    DM2 CBG variable - follow   Hyperkalemia > Hypokalemia Resolved w/ resumption of PD - K+ ok today - follow w/o change   Pressure ulcer v/s MASD See WOC note for wound info and tx recs   RLL lung mass vs Atelectasis vs Pneumonia -Per Ms.Jaunita Katzenstein sister did not want any further workup - I agree she would not be a candidate for aggressive tx of a lung CA   Acute R hand swelling - IV dilantin infiltration - SVT of R arm Arterial dopplers suggest adequate flow, but noted superficial thrombus in R cephalic and basilic veins - no indication for interventions per Vasc Surg or Hand Surgery - follow clinically   MRSA screen +  DVT prophylaxis: SCDs Code Status: FULL CODE Family Communication: no family present at time of exam  Disposition Plan: plan is for d/c home in AM per pt/family request - I do not expect her to do well at home, but the pt insists on d/c home and is not presently a candidate for SNF due to her need for ongoing PD  Consultants:  Nephrology  Neurology Palliative Care Vasc Surgery  Hand Surgery    Procedures: none  Antimicrobials:  Ceftriaxone 5/27 > 5/30  Subjective: Alert and conversant.  C/o ongoing pain in her R hand/arm.  Denies cp, sob, n/v, or abdom pain.  Tells me she wants to go home as soon as possible.    Objective: Blood pressure 70/45, pulse 73, temperature 98.2 F (36.8 C), temperature source Oral, resp. rate 16, height '5\' 6"'$  (1.676 m), weight 57.3 kg (126 lb 5.2 oz), SpO2 94 %.  Intake/Output Summary (Last 24 hours) at 11/19/15 1546 Last data filed at 11/19/15 1300  Gross per 24 hour    Intake    720 ml  Output      0 ml  Net    720 ml   Filed Weights   11/16/15 0410 11/17/15 0500 11/18/15 0434  Weight: 56.2 kg (123 lb 14.4 oz) 58.8 kg (129 lb 10.1 oz) 57.3 kg (126 lb 5.2 oz)    Examination: General: No acute respiratory distress - alert  Lungs: Clear to auscultation bilaterally w/o wheeze  Cardiovascular: Regular rate and rhythm - no M  Abdomen: Nondistended, soft, bowel sounds positive, PD cath insertion site clean and dry  Extremities: No significant cyanosis, clubbing, or edema bilateral lower extremities - R arm dusky 2/ 1+ edema   CBC:  Recent Labs Lab 11/15/15 0240 11/16/15 0634 11/17/15 0356 11/17/15 1441 11/18/15 0327 11/19/15 0715  WBC 14.8* 13.8* 14.0* 11.8* 10.7* 7.7  NEUTROABS 10.5* 9.6* 8.4*  --  6.8 4.3  HGB 11.4* 11.0* 9.7* 10.4* 10.1* 9.4*  HCT 38.7 37.1 31.9* 34.0* 32.3* 30.5*  MCV 88.4 87.7 86.4 88.8 87.5 85.9  PLT 330 292 279 261 241 093   Basic Metabolic Panel:  Recent Labs Lab 11/14/15 0501 11/15/15 0240 11/16/15 0634 11/17/15 0356 11/18/15 0327 11/19/15 0715  NA 139  140 139 135 137 134* 135  K 3.7  3.7 3.6 3.2* 3.1* 3.1* 3.4*  CL 101  101 99* 98* 100* 98* 101  CO2 '25  23 27 24 24 24 22  '$ GLUCOSE 194*  187* 75 95 48* 159* 104*  BUN 21*  21* 23* '18 20 20 19  '$ CREATININE 5.11*  4.93* 5.28* 4.76* 4.74* 4.50* 4.40*  CALCIUM 9.0  9.0 9.0 8.3* 7.9* 8.1* 8.3*  MG 1.9 1.8 1.6* 2.2  --   --   PHOS 6.2*  6.1* 6.2*  --  5.9* 6.0* 5.5*   GFR: Estimated Creatinine Clearance: 12.5 mL/min (by C-G formula based on Cr of 4.4).  Liver Function Tests:  Recent Labs Lab 11/14/15 0501 11/15/15 0240 11/17/15 0356 11/18/15 0327 11/19/15 0715  AST '19 18 18  '$ --   --   ALT 10* 8* 10*  --   --   ALKPHOS 92 83 77  --   --   BILITOT 0.3 0.2* 0.4  --   --   PROT 7.5 6.8 5.7*  --   --   ALBUMIN 2.2*  2.1* 1.9* 1.7* 1.8* 1.5*    Recent Labs Lab 11/14/15 0501  AMMONIA 20   HbA1C: HEMOGLOBIN A1C  Date/Time Value Ref  Range Status  05/31/2014 04:55 AM 8.0* 4.2-6.3 % Final    Comment:    The American Diabetes Association recommends that a primary goal of therapy should be <7% and that physicians should reevaluate the treatment regimen in patients with HbA1c values consistently >8%.    HGB A1C MFR BLD  Date/Time Value Ref Range  Status  11/12/2015 03:20 AM 5.6 4.8 - 5.6 % Final    Comment:    (NOTE)         Pre-diabetes: 5.7 - 6.4         Diabetes: >6.4         Glycemic control for adults with diabetes: <7.0     CBG:  Recent Labs Lab 11/18/15 0808 11/18/15 1139 11/18/15 1614 11/19/15 0752 11/19/15 1150  GLUCAP 127* 134* 255* 96 225*    Recent Results (from the past 240 hour(s))  C difficile quick scan w PCR reflex     Status: None   Collection Time: 11/12/15 10:47 PM  Result Value Ref Range Status   C Diff antigen NEGATIVE NEGATIVE Final   C Diff toxin NEGATIVE NEGATIVE Final   C Diff interpretation Negative for toxigenic C. difficile  Final  Culture, blood (routine x 2)     Status: None (Preliminary result)   Collection Time: 11/15/15  2:47 PM  Result Value Ref Range Status   Specimen Description BLOOD RIGHT WRIST  Final   Special Requests IN PEDIATRIC BOTTLE 1.5 CC  Final   Culture NO GROWTH 3 DAYS  Final   Report Status PENDING  Incomplete  Culture, blood (routine x 2)     Status: None (Preliminary result)   Collection Time: 11/15/15  2:51 PM  Result Value Ref Range Status   Specimen Description BLOOD RIGHT WRIST  Final   Special Requests IN PEDIATRIC BOTTLE 1.5CC  Final   Culture NO GROWTH 3 DAYS  Final   Report Status PENDING  Incomplete     Scheduled Meds: . darbepoetin (ARANESP) injection - NON-DIALYSIS  60 mcg Subcutaneous Q Mon-1800  . feeding supplement  1 Container Oral TID BM  . gentamicin cream  1 application Topical Daily  . insulin aspart  0-15 Units Subcutaneous TID WC  . insulin aspart  0-5 Units Subcutaneous QHS  . levETIRAcetam  500 mg Oral BID  .  levothyroxine  50 mcg Oral QAC breakfast  . midodrine  5 mg Oral TID WC  . multivitamin  1 tablet Oral QHS  . ondansetron (ZOFRAN) IV  4 mg Intravenous Q6H  . pantoprazole  40 mg Oral BID  . phenytoin  100 mg Oral QHS  . potassium chloride  20 mEq Oral TID     LOS: 11 days   Time spent: 25 minutes   Cherene Altes, MD [facility] Office  [phone] Pager - Text Page per Amion as per below:  On-Call/Text Page:      [email or web address]      password [email or web address]  If 7PM-7AM, please contact night-coverage [email or web address] Password [facility] 11/19/2015, 3:46 PM

## 2015-11-19 NOTE — Care Management Note (Signed)
Case Management Note  Patient Details  Name: ALEGRA ROST MRN: [id number] Date of Birth: 08/10/1955  Subjective/Objective:            CM following for progression and d/c planning.         Action/Plan: 11/19/2015 Pt transferred to [place] on 11/18/15, chart reviewed and noted CSW notes re d/c plan. Per documentation this pt will receive [facility] services in the home and case management assistance is not needed. Plan appears to be ongoing PD in the home by family and [facility] to provide equipment and services. Will follow and arrange for any needs as requested.   Expected Discharge Date:    11/21/2015              Expected Discharge Plan:  [place]  In-House Referral:  Clinical Social Work  Discharge planning Services  CM Consult  Post Acute Care Choice:  Home Health Choice offered to:     DME Arranged:    DME Agency:     HH Arranged:    [place] Agency:     Status of Service:  In process, will continue to follow  Medicare Important Message Given:  Yes Date Medicare IM Given:    Medicare IM give by:    Date Additional Medicare IM Given:    Additional Medicare Important Message give by:     If discussed at [place] of Stay Meetings, dates discussed:    Additional Comments:  Adron Bene, RN 11/19/2015, 10:47 AM

## 2015-11-19 NOTE — Progress Notes (Signed)
S: No new CO.   O:BP 70/45 mmHg  Pulse 73  Temp(Src) 98.2 F (36.8 C) (Oral)  Resp 16  Ht '5\' 6"'$  (1.676 m)  Wt 57.3 kg (126 lb 5.2 oz)  BMI 20.40 kg/m2  SpO2 94%  Intake/Output Summary (Last 24 hours) at 11/19/15 0937 Last data filed at 11/19/15 0925  Gross per 24 hour  Intake    800 ml  Output      0 ml  Net    800 ml   Weight change:  Gen: Awake and alert CVS: RRR Resp: Decreased BS bases Abd: + BS NT ND.  PD exit site clean, no drainage Ext: No edema except Rt arm with mild swelling and erythema NEURO: CNI Ox2 (person and place) says it is 1997.  More alert   . darbepoetin (ARANESP) injection - NON-DIALYSIS  60 mcg Subcutaneous Q Mon-1800  . feeding supplement  1 Container Oral TID BM  . gentamicin cream  1 application Topical Daily  . insulin aspart  0-15 Units Subcutaneous TID WC  . insulin aspart  0-5 Units Subcutaneous QHS  . levETIRAcetam  500 mg Oral BID  . levothyroxine  50 mcg Oral QAC breakfast  . midodrine  5 mg Oral TID WC  . multivitamin  1 tablet Oral QHS  . ondansetron (ZOFRAN) IV  4 mg Intravenous Q6H  . pantoprazole  40 mg Oral BID  . phenytoin  100 mg Oral QHS  . potassium chloride  20 mEq Oral TID   No results found. BMET    Component Value Date/Time   NA 135 11/19/2015 0715   NA 135* 05/31/2014 0455   K 3.4* 11/19/2015 0715   K 3.0* 05/31/2014 0455   CL 101 11/19/2015 0715   CL 98 05/31/2014 0455   CO2 22 11/19/2015 0715   CO2 21 05/31/2014 0455   GLUCOSE 104* 11/19/2015 0715   GLUCOSE 223* 05/31/2014 0455   BUN 19 11/19/2015 0715   BUN 35* 05/31/2014 0455   CREATININE 4.40* 11/19/2015 0715   CREATININE 5.19* 05/31/2014 0455   CALCIUM 8.3* 11/19/2015 0715   CALCIUM 9.2 05/31/2014 0455   GFRNONAA 10* 11/19/2015 0715   GFRNONAA 9* 05/31/2014 0455   GFRNONAA 11* 07/16/2013 0420   GFRAA 12* 11/19/2015 0715   GFRAA 11* 05/31/2014 0455   GFRAA 13* 07/16/2013 0420   CBC    Component Value Date/Time   WBC 7.7 11/19/2015 0715   WBC  17.4* 05/31/2014 0455   RBC 3.55* 11/19/2015 0715   RBC 3.69* 05/31/2014 0455   HGB 9.4* 11/19/2015 0715   HGB 11.3* 05/31/2014 1130   HCT 30.5* 11/19/2015 0715   HCT 35.0 05/31/2014 0455   PLT 223 11/19/2015 0715   PLT 282 05/31/2014 0455   MCV 85.9 11/19/2015 0715   MCV 95 05/31/2014 0455   MCH 26.5 11/19/2015 0715   MCH 31.7 05/31/2014 0455   MCHC 30.8 11/19/2015 0715   MCHC 33.4 05/31/2014 0455   RDW 18.6* 11/19/2015 0715   RDW 12.0 05/31/2014 0455   LYMPHSABS 2.2 11/19/2015 0715   LYMPHSABS 2.1 05/31/2014 0455   MONOABS 1.0 11/19/2015 0715   MONOABS 0.9 05/31/2014 0455   EOSABS 0.2 11/19/2015 0715   EOSABS 0.0 05/31/2014 0455   BASOSABS 0.0 11/19/2015 0715   BASOSABS 0 05/31/2014 0801   BASOSABS 0.0 05/31/2014 0455     Assessment: 1. Seizures 2. Anemia on aranesp 3. ESRD on CCPD. On 1.5% for all exchanges.  No UF yet recorded for  today 4. Hypokalemia, on replacement, K sl better Plan: 1. Cont CCPD 2. Note plans for DC Friday    Sherri Davidson T

## 2015-11-20 DIAGNOSIS — R69 Illness, unspecified: Secondary | ICD-10-CM

## 2015-11-20 DIAGNOSIS — IMO0002 Reserved for concepts with insufficient information to code with codable children: Secondary | ICD-10-CM | POA: Diagnosis present

## 2015-11-20 LAB — GLUCOSE, CAPILLARY
GLUCOSE-CAPILLARY: 117 mg/dL — AB (ref 65–99)
GLUCOSE-CAPILLARY: 211 mg/dL — AB (ref 65–99)
GLUCOSE-CAPILLARY: 114 mg/dL — AB (ref 65–99)
Glucose-Capillary: 217 mg/dL — ABNORMAL HIGH (ref 65–99)

## 2015-11-20 LAB — CBC
HCT: 32.6 % — ABNORMAL LOW (ref 36.0–46.0)
HEMOGLOBIN: 10 g/dL — AB (ref 12.0–15.0)
MCH: 26.6 pg (ref 26.0–34.0)
MCHC: 30.7 g/dL (ref 30.0–36.0)
MCV: 86.7 fL (ref 78.0–100.0)
Platelets: 252 10*3/uL (ref 150–400)
RBC: 3.76 MIL/uL — ABNORMAL LOW (ref 3.87–5.11)
RDW: 19.1 % — ABNORMAL HIGH (ref 11.5–15.5)
WBC: 8.5 10*3/uL (ref 4.0–10.5)

## 2015-11-20 LAB — RENAL FUNCTION PANEL
Albumin: 1.6 g/dL — ABNORMAL LOW (ref 3.5–5.0)
Anion gap: 9 (ref 5–15)
BUN: 21 mg/dL — ABNORMAL HIGH (ref 6–20)
CALCIUM: 8.3 mg/dL — AB (ref 8.9–10.3)
CHLORIDE: 103 mmol/L (ref 101–111)
CO2: 23 mmol/L (ref 22–32)
Creatinine, Ser: 4.04 mg/dL — ABNORMAL HIGH (ref 0.44–1.00)
GFR calc Af Amer: 13 mL/min — ABNORMAL LOW (ref >60)
GFR calc non Af Amer: 11 mL/min — ABNORMAL LOW (ref >60)
GLUCOSE: 166 mg/dL — AB (ref 65–99)
POTASSIUM: 4.8 mmol/L (ref 3.5–5.1)
Phosphorus: 4.2 mg/dL (ref 2.5–4.6)
SODIUM: 135 mmol/L (ref 135–145)

## 2015-11-20 LAB — CULTURE, BLOOD (ROUTINE X 2)
CULTURE: NO GROWTH
Culture: NO GROWTH

## 2015-11-20 MED ORDER — GENTAMICIN SULFATE 0.1 % EX CREA
1.0000 "application " | TOPICAL_CREAM | Freq: Every day | CUTANEOUS | Status: DC
  Administered 2015-11-20 – 2015-11-21 (×2): 1 via TOPICAL
  Filled 2015-11-20: qty 15

## 2015-11-20 MED ORDER — POTASSIUM CHLORIDE CRYS ER 20 MEQ PO TBCR
20.0000 meq | EXTENDED_RELEASE_TABLET | Freq: Every day | ORAL | Status: DC
  Administered 2015-11-20 – 2015-11-21 (×2): 20 meq via ORAL
  Filled 2015-11-20 (×2): qty 1

## 2015-11-20 NOTE — Progress Notes (Signed)
S:    Eating better.  Still some pain in Rt arm O:BP 95/57 mmHg  Pulse 81  Temp(Src) 98.2 F (36.8 C) (Oral)  Resp 20  Ht '5\' 6"'$  (1.676 m)  Wt 62.4 kg (137 lb 9.1 oz)  BMI 22.21 kg/m2  SpO2 98%  Intake/Output Summary (Last 24 hours) at 11/20/15 0925 Last data filed at 11/20/15 0254  Gross per 24 hour  Intake    720 ml  Output      0 ml  Net    720 ml   Weight change:  Gen: Awake and alert CVS: RRR Resp:  clear Abd: + BS NT ND.  PD exit site wrapped Ext: No edema except Rt arm with mild swelling and erythema NEURO: CNI Ox2 (person and place) says it is 1997.  More alert   . darbepoetin (ARANESP) injection - NON-DIALYSIS  60 mcg Subcutaneous Q Mon-1800  . dialysis solution for CAPD/CCPD (DELFLEX)   Peritoneal Dialysis QHS  . feeding supplement  1 Container Oral TID BM  . gentamicin cream  1 application Topical Daily  . gentamicin cream  1 application Topical Daily  . insulin aspart  0-15 Units Subcutaneous TID WC  . insulin aspart  0-5 Units Subcutaneous QHS  . levETIRAcetam  500 mg Oral BID  . levothyroxine  50 mcg Oral QAC breakfast  . midodrine  5 mg Oral TID WC  . multivitamin  1 tablet Oral QHS  . ondansetron (ZOFRAN) IV  4 mg Intravenous Q6H  . pantoprazole  40 mg Oral BID  . phenytoin  100 mg Oral QHS  . potassium chloride  20 mEq Oral TID   No results found. BMET    Component Value Date/Time   NA 135 11/20/2015 0500   NA 135* 05/31/2014 0455   K 4.8 11/20/2015 0500   K 3.0* 05/31/2014 0455   CL 103 11/20/2015 0500   CL 98 05/31/2014 0455   CO2 23 11/20/2015 0500   CO2 21 05/31/2014 0455   GLUCOSE 166* 11/20/2015 0500   GLUCOSE 223* 05/31/2014 0455   BUN 21* 11/20/2015 0500   BUN 35* 05/31/2014 0455   CREATININE 4.04* 11/20/2015 0500   CREATININE 5.19* 05/31/2014 0455   CALCIUM 8.3* 11/20/2015 0500   CALCIUM 9.2 05/31/2014 0455   GFRNONAA 11* 11/20/2015 0500   GFRNONAA 9* 05/31/2014 0455   GFRNONAA 11* 07/16/2013 0420   GFRAA 13* 11/20/2015 0500    GFRAA 11* 05/31/2014 0455   GFRAA 13* 07/16/2013 0420   CBC    Component Value Date/Time   WBC 8.5 11/20/2015 0345   WBC 17.4* 05/31/2014 0455   RBC 3.76* 11/20/2015 0345   RBC 3.69* 05/31/2014 0455   HGB 10.0* 11/20/2015 0345   HGB 11.3* 05/31/2014 1130   HCT 32.6* 11/20/2015 0345   HCT 35.0 05/31/2014 0455   PLT 252 11/20/2015 0345   PLT 282 05/31/2014 0455   MCV 86.7 11/20/2015 0345   MCV 95 05/31/2014 0455   MCH 26.6 11/20/2015 0345   MCH 31.7 05/31/2014 0455   MCHC 30.7 11/20/2015 0345   MCHC 33.4 05/31/2014 0455   RDW 19.1* 11/20/2015 0345   RDW 12.0 05/31/2014 0455   LYMPHSABS 2.2 11/19/2015 0715   LYMPHSABS 2.1 05/31/2014 0455   MONOABS 1.0 11/19/2015 0715   MONOABS 0.9 05/31/2014 0455   EOSABS 0.2 11/19/2015 0715   EOSABS 0.0 05/31/2014 0455   BASOSABS 0.0 11/19/2015 0715   BASOSABS 0 05/31/2014 0801   BASOSABS 0.0 05/31/2014 0455  Assessment: 1. Seizures 2. Anemia on aranesp 3. ESRD on CCPD. On 1.5% for all exchanges. Net UF 23cc yest  Will cont current script 4. Hypokalemia, on replacement, K better Plan: 1. Cont CCPD 2. Decrease K dose    Navy Rothschild T

## 2015-11-20 NOTE — Care Management Note (Signed)
Case Management Note  Patient Details  Name: Sherri Davidson MRN: [id number] Date of Birth: 08/30/1955  Subjective/Objective:         CM following for progression and d/c planning.           Action/Plan: 11/20/2015 Pt for d/c to home tomorrow, 11/21/15 will need ambulance transport as pt is 2+ assist for all transfers and very weak. [facility] provide [facility] and DME for this pt. Social worker at [place] is April Wade@  [phone]. This CM contacted Ms Alveta Heimlich today re plan to d/c this pt on 11/21/15. She would like to be notified when the pt is d/c. Unable to reach pt sister today.   Expected Discharge Date:       11/21/2015           Expected Discharge Plan:  [place]  In-House Referral:  Clinical Social Work  Discharge planning Services  CM Consult  Post Acute Care Choice:  Home Health Choice offered to:     DME Arranged:    DME Agency:     HH Arranged:  Nurse's Aide, RN [place] Agency:  Other - See comment ([facility] will provide all [place] services. )  Status of Service:  Completed, signed off  Medicare Important Message Given:  Yes Date Medicare IM Given:    Medicare IM give by:    Date Additional Medicare IM Given:    Additional Medicare Important Message give by:     If discussed at [place] of Stay Meetings, dates discussed:    Additional Comments:  Adron Bene, RN 11/20/2015, 4:53 PM

## 2015-11-20 NOTE — Progress Notes (Signed)
PROGRESS NOTE    Sherri Davidson  PHX:[id number] DOB: 1955/09/24 DOA: 11/08/2015 PCP: [name]   Brief Narrative:  60 y.o. WF PMHx CVA, HTN, CHF, hypothyroidism, DM2, and ESRD on peritoneal hemodialysis daily, gastric ulcer 2011 following EGD/colonoscopy performed in [name] by a [name].   who presented with seizures. Around 1pm the patient was noted to have her first seizure that lasted 5-6 minutes with generalized tonic-clonic movements. EMS was called and patient was able to return to baseline, at which time she refused transport to the hospital. Around 2:18 PM she had a second seizure with acute left-sided facial drooping for which EMS was again called. Patient was very lethargic and altered after her seizing stoped. In the ambulance the patient had a third seizure. Patient was recently taken off of [place] by her Neurologist (Dr. Melrose Nakayama at [name]). Dilantin was stopped in 06/2015 after the patient had repeated falls. Her last prior seizure activity was 05/2015.  In the ED CT and MRI of the brain showed previous right sided CVA, but no acute abnormalities. UA was positive. UDS negative. Patient was evaluated by Neurology who recommended continuation of Keppra 500 mg daily and Dilantin 100 mg 3 times a day.    Assessment & Plan:   Principal Problem:   Seizures ([name]) Active Problems:   Pressure ulcer   UTI (lower urinary tract infection)   Acute encephalopathy   Hyperkalemia   Todd's paralysis ([name])   ESRD on peritoneal dialysis ([name])   Hallucinations   Dysphagia   Controlled diabetes mellitus type 2 with complications ([name])   Coffee ground emesis   Palliative care encounter   Goals of care, counseling/discussion   Arterial occlusion ([name])   Stroke (cerebrum) ([name])   Rectal bleeding   Severe malnutrition ([name])   Ischemia of hand   Multisystem organ failure -sister/caregiver/POA Sherri Davidson counseled at length concerning patient's  poor overall prognosis with multiple systems which are failing.  Seizures / Todd's paralysis / acute encephalopathy -Per her sister/caregiver/POA Sherri Davidson patient usually is A/O 4 at home. Also states patient's primary neurologist had taken patient off of Dilantin secondary to increased lethargy. -Dilantin 100 mg daily per neurology recommendation - Keppra 500 mg daily -Per Neurology note 6/5 patient neurologically stable; Discharge patient on current medication -Ambulate patient in the hall q shift  CVA -CT and MRI showed multiple old infarcts. Sister aware  Dysphagia -5/30 Passed swallow evaluation; dysphagia 1 nectar thick liquid    Possible Urinary tract infection -Completed 4 days of empiric antibiotics; repeat cultures negative   End-stage renal disease on peritoneal dialysis -PD per Nephrology  -Spoke at length with [name] nephrology, who spoke spoke with her regular nephrologist. Continue home PD. Patient not good candidate for HD.  -Strict in and out since admission - 176m -Daily weight  Filed Weights   11/17/15 0500 11/18/15 0434 11/19/15 2207  Weight: 58.8 kg (129 lb 10.1 oz) 57.3 kg (126 lb 5.2 oz) 62.4 kg (137 lb 9.1 oz)     Chronic hypotension -Midodrin 5 mg TID  DM type 2 controlled with complication -50/16Hemoglobin A1c= 5.6 -Lipid panel; within ADA guideline -Moderate SSI  Hypokalemia -Manage per PD  Hypomagnesemia  -Resolved  Pressure ulcer v/s MASD See WOC note for wound info and tx recs  Purple glove syndrome (PGS) -Patient initial dose of IV Dilantin infiltrated. IV administration immediately discontinued and IV removed. -Over last 48 hours patient has elevated hand and had warm compresses  applied. Unfortunately has continued to progress.  -Continue to elevate hand, apply warm compress as tolerated obtain right upper extremity angiogram -Per vascular surgery/orthopedic hand surgery no intervention at this time.  Positive  SVT RUE -Patient with SVT multiple vessels see results below. Unable to anticoagulate secondary to GI bleed  Coffee-ground emesis/Hx Gastric ulcer -Discontinue heparin products -6/1 @0041  Gastroccult card. Positive Recent Labs Lab 11/17/15 0356 11/17/15 1441 11/18/15 0327 11/19/15 0715 11/20/15 0345  HGB 9.7* 10.4* 10.1* 9.4* 10.0*  -Transfuse for hemoglobin<7 -6/2 consulted [facility]; would not perform EGD at this time secondary to patient's multiple medical problems and stable H/H  RLL lung mass vs Atelectasis vs Pneumonia -Per Sherri Davidson sister did not want any further workup. -Per new 2017 Fleischner society guidelines for Solitary nodule size: >8 mm, in either low or high risk patients Follow-up CT at 3 months and/or CT-PET, and/or biopsy. Patient would require new chest CT on/near July 11      Goals of care -Will attempt to contact patient's sister/caregiver/POA Sherri Davidson who is on vacation I/O discuss when we can have meeting to discuss short-term/long-term goals care -6/2; Sherri Davidson (sister) met with Palliative Care PA Melton Alar and insisted that patient symptoms secondary to her being on Dilantin. Neurology contacted requesting Dilantin be DC'd. Will await their recommendation -Spoke with Dr. Durenda Guthrie Medical Director of [facility] would like to be kept in the loop on plan for Sherri Davidson Cell [phone] -Discharge on 6/9    DVT prophylaxis: SCD Code Status: Full Family Communication: None present;  Disposition Plan: Home the next 24-48 hrs.   Consultants:  Dr.James Deterding Neph[facility]  Neurology  Dr.Malcolm T [facility]  Dr. Ruta Hinds vascular surgery [facility] orthopedic hand surgery   Procedures/Significant Events:  4/11 CT chest WO contrast;-Oval area of dense consolidation in the posterior, inferior RLL to my: Rounded PNA vs rounded atelectasis vs underlying mass - Small to moderate Rt pleural effusion. -Stable 3  mm benign subpleural nodule LUL -Large calcified disc herniation at the T9-10 level, causing moderate canal stenosis and probable cord compression on the right. 5/27 CT head WO contrast;-Changes of prior infarcts are noted in the distribution of the right middle cerebral artery. -Lacunar infarct again identified on the right in the basal ganglia. No acute hemorrhage, acute infarction or space-occupying mass lesion.  5/27 MRA head;-Findings suspicious for prominent intracranial atherosclerotic changes including significant stenosis left internal carotid artery cavernous segment, significant posterior cerebral artery narrowing greater on the left and decrease number of visualized right middle cerebral artery branch vessels C/W remote right hemispheric infarct. 5/27 MRI brain;-Remote large right hemispheric infarct with encephalomalacia right frontal lobe, parietal lobe and temporal lobe with subsequent dilation right temporal horn. -Remote infarct right corona radiata/lenticular nucleus. Remote small infarct left caudate.Remote small infarct left thalamus. 6/4 RUE arterial duplex; Thrombus vs mild stenosis axillary artery?. -Positive SVT throughout Rt Cephalic vein from forearm to upper arm as well as in the basilic vein from mid upper arm to just before the axilla.    Cultures 5/27 MRSA by PCR positive 6/3 urine pending 6/3 blood right wrist 2 NGTD  Antimicrobials: Ceftriaxone 5/27 > 5/30   Devices  LINES / TUBES:      Continuous Infusions:     Subjective:  6/8  A/O  4, resting comfortably in bed   Objective: Filed Vitals:   11/19/15 1720 11/19/15 2207 11/20/15 0625 11/20/15 0906  BP: 73/46 103/62 95/61 95/57   Pulse: 83 76 79  81  Temp: 98.4 F (36.9 C) 98.3 F (36.8 C) 98.1 F (36.7 C) 98.2 F (36.8 C)  TempSrc: Oral Oral Oral Oral  Resp: 18 19 19 20   Height:      Weight:  62.4 kg (137 lb 9.1 oz)    SpO2: 97% 98% 100% 98%    Intake/Output Summary (Last 24 hours)  at 11/20/15 1727 Last data filed at 11/20/15 4174  Gross per 24 hour  Intake    480 ml  Output      0 ml  Net    480 ml   Filed Weights   11/17/15 0500 11/18/15 0434 11/19/15 2207  Weight: 58.8 kg (129 lb 10.1 oz) 57.3 kg (126 lb 5.2 oz) 62.4 kg (137 lb 9.1 oz)    Examination: General:A/O  4, cachectic, No acute respiratory distress Eyes: negative scleral hemorrhage, negative anisocoria, negative icterus ENT: Negative Runny nose, negative gingival bleeding, Neck:  Negative scars, masses, torticollis, lymphadenopathy, JVD Lungs: Clear to auscultation bilaterally without wheezes or crackles Cardiovascular: Regular rate and rhythm without murmur gallop or rub normal S1 and S2 Abdomen: negative abdominal pain, nondistended, positive soft, bowel sounds, no rebound, no ascites, no appreciable mass Extremities: Patient with previous mild cyanosis right upper extremity (infiltration Dilantin 1 dose), continue RUE cyanosis, swelling; now extending past elbow. Fingers swelling has not decreased.  Skin: Negative rashes, lesions, blisters right hand  Psychiatric:  Negative hallucinations, negative depression, negative anxiety  Central nervous system:  Moves all extremities, strength 5/5 all extremities, sensation intact RUE painful palpation, negative dysarthria, negative expressive aphasia, negative receptive aphasia.  .     Data Reviewed: Care during the described time interval was provided by me .  I have reviewed this patient's available data, including medical history, events of note, physical examination, and all test results as part of my evaluation. I have personally reviewed and interpreted all radiology studies.  CBC:  Recent Labs Lab 11/15/15 0240 11/16/15 0634 11/17/15 0356 11/17/15 1441 11/18/15 0327 11/19/15 0715 11/20/15 0345  WBC 14.8* 13.8* 14.0* 11.8* 10.7* 7.7 8.5  NEUTROABS 10.5* 9.6* 8.4*  --  6.8 4.3  --   HGB 11.4* 11.0* 9.7* 10.4* 10.1* 9.4* 10.0*  HCT 38.7  37.1 31.9* 34.0* 32.3* 30.5* 32.6*  MCV 88.4 87.7 86.4 88.8 87.5 85.9 86.7  PLT 330 292 279 261 241 223 081   Basic Metabolic Panel:  Recent Labs Lab 11/14/15 0501 11/15/15 0240 11/16/15 0634 11/17/15 0356 11/18/15 0327 11/19/15 0715 11/20/15 0500  NA 139  140 139 135 137 134* 135 135  K 3.7  3.7 3.6 3.2* 3.1* 3.1* 3.4* 4.8  CL 101  101 99* 98* 100* 98* 101 103  CO2 25  23 27 24 24 24 22 23   GLUCOSE 194*  187* 75 95 48* 159* 104* 166*  BUN 21*  21* 23* 18 20 20 19  21*  CREATININE 5.11*  4.93* 5.28* 4.76* 4.74* 4.50* 4.40* 4.04*  CALCIUM 9.0  9.0 9.0 8.3* 7.9* 8.1* 8.3* 8.3*  MG 1.9 1.8 1.6* 2.2  --   --   --   PHOS 6.2*  6.1* 6.2*  --  5.9* 6.0* 5.5* 4.2   GFR: Estimated Creatinine Clearance: 14 mL/min (by C-G formula based on Cr of 4.04). Liver Function Tests:  Recent Labs Lab 11/14/15 0501 11/15/15 0240 11/17/15 0356 11/18/15 0327 11/19/15 0715 11/20/15 0500  AST 19 18 18   --   --   --   ALT 10* 8* 10*  --   --   --  ALKPHOS 92 83 77  --   --   --   BILITOT 0.3 0.2* 0.4  --   --   --   PROT 7.5 6.8 5.7*  --   --   --   ALBUMIN 2.2*  2.1* 1.9* 1.7* 1.8* 1.5* 1.6*   No results for input(s): LIPASE, AMYLASE in the last 168 hours.  Recent Labs Lab 11/14/15 0501  AMMONIA 20   Coagulation Profile: No results for input(s): INR, PROTIME in the last 168 hours. Cardiac Enzymes: No results for input(s): CKTOTAL, CKMB, CKMBINDEX, TROPONINI in the last 168 hours. BNP (last 3 results) No results for input(s): PROBNP in the last 8760 hours. HbA1C: No results for input(s): HGBA1C in the last 72 hours. CBG:  Recent Labs Lab 11/19/15 1638 11/19/15 2138 11/20/15 0806 11/20/15 1136 11/20/15 1719  GLUCAP 111* 151* 117* 211* 114*   Lipid Profile: No results for input(s): CHOL, HDL, LDLCALC, TRIG, CHOLHDL, LDLDIRECT in the last 72 hours. Thyroid Function Tests: No results for input(s): TSH, T4TOTAL, FREET4, T3FREE, THYROIDAB in the last 72  hours. Anemia Panel: No results for input(s): VITAMINB12, FOLATE, FERRITIN, TIBC, IRON, RETICCTPCT in the last 72 hours. Urine analysis:    Component Value Date/Time   COLORURINE YELLOW 11/08/2015 1707   COLORURINE Yellow 05/30/2014 1618   APPEARANCEUR TURBID* 11/08/2015 1707   APPEARANCEUR Clear 05/30/2014 1618   LABSPEC 1.018 11/08/2015 1707   LABSPEC 1.009 05/30/2014 1618   PHURINE 7.5 11/08/2015 1707   PHURINE 7.0 05/30/2014 1618   GLUCOSEU 100* 11/08/2015 1707   GLUCOSEU >=500 05/30/2014 1618   HGBUR LARGE* 11/08/2015 1707   HGBUR Negative 05/30/2014 1618   BILIRUBINUR NEGATIVE 11/08/2015 1707   BILIRUBINUR Negative 05/30/2014 1618   KETONESUR NEGATIVE 11/08/2015 1707   KETONESUR Negative 05/30/2014 1618   PROTEINUR >300* 11/08/2015 1707   PROTEINUR 100 mg/dL 05/30/2014 1618   NITRITE NEGATIVE 11/08/2015 1707   NITRITE Negative 05/30/2014 1618   LEUKOCYTESUR LARGE* 11/08/2015 1707   LEUKOCYTESUR Negative 05/30/2014 1618   Sepsis Labs: @LABRCNTIP (procalcitonin:4,lacticidven:4)  ) Recent Results (from the past 240 hour(s))  C difficile quick scan w PCR reflex     Status: None   Collection Time: 11/12/15 10:47 PM  Result Value Ref Range Status   C Diff antigen NEGATIVE NEGATIVE Final   C Diff toxin NEGATIVE NEGATIVE Final   C Diff interpretation Negative for toxigenic C. difficile  Final  Culture, blood (routine x 2)     Status: None   Collection Time: 11/15/15  2:47 PM  Result Value Ref Range Status   Specimen Description BLOOD RIGHT WRIST  Final   Special Requests IN PEDIATRIC BOTTLE 1.5 CC  Final   Culture NO GROWTH 5 DAYS  Final   Report Status 11/20/2015 FINAL  Final  Culture, blood (routine x 2)     Status: None   Collection Time: 11/15/15  2:51 PM  Result Value Ref Range Status   Specimen Description BLOOD RIGHT WRIST  Final   Special Requests IN PEDIATRIC BOTTLE 1.5CC  Final   Culture NO GROWTH 5 DAYS  Final   Report Status 11/20/2015 FINAL  Final          Radiology Studies: No results found.      Scheduled Meds: . darbepoetin (ARANESP) injection - NON-DIALYSIS  60 mcg Subcutaneous Q Mon-1800  . dialysis solution for CAPD/CCPD (DELFLEX)   Peritoneal Dialysis QHS  . feeding supplement  1 Container Oral TID BM  . gentamicin cream  1  application Topical Daily  . gentamicin cream  1 application Topical Daily  . insulin aspart  0-15 Units Subcutaneous TID WC  . insulin aspart  0-5 Units Subcutaneous QHS  . levETIRAcetam  500 mg Oral BID  . levothyroxine  50 mcg Oral QAC breakfast  . midodrine  5 mg Oral TID WC  . multivitamin  1 tablet Oral QHS  . ondansetron (ZOFRAN) IV  4 mg Intravenous Q6H  . pantoprazole  40 mg Oral BID  . phenytoin  100 mg Oral QHS  . potassium chloride  20 mEq Oral Daily   Continuous Infusions:     LOS: 12 days    Time spent: 40 minutes    WOODS, Geraldo Docker, MD [facility] Pager [phone]   If 7PM-7AM, please contact night-coverage [email or web address] Password [facility] 11/20/2015, 5:27 PM

## 2015-11-21 LAB — GLUCOSE, CAPILLARY
GLUCOSE-CAPILLARY: 162 mg/dL — AB (ref 65–99)
Glucose-Capillary: 161 mg/dL — ABNORMAL HIGH (ref 65–99)

## 2015-11-21 MED ORDER — RENA-VITE PO TABS: 1.0000 | ORAL_TABLET | Freq: Every day | ORAL | Status: AC

## 2015-11-21 MED ORDER — ASPIRIN EC 81 MG PO TBEC: 81.0000 mg | DELAYED_RELEASE_TABLET | Freq: Every day | ORAL | Status: AC

## 2015-11-21 MED ORDER — PHENYTOIN SODIUM EXTENDED 100 MG PO CAPS: 100.0000 mg | ORAL_CAPSULE | Freq: Every day | ORAL | Status: DC

## 2015-11-21 MED ORDER — RESOURCE THICKENUP CLEAR PO POWD: ORAL | Status: DC

## 2015-11-21 MED ORDER — CLOPIDOGREL BISULFATE 75 MG PO TABS: 75.0000 mg | ORAL_TABLET | Freq: Every day | ORAL | Status: AC

## 2015-11-21 MED ORDER — MIDODRINE HCL 5 MG PO TABS: 5.0000 mg | ORAL_TABLET | Freq: Three times a day (TID) | ORAL | Status: AC

## 2015-11-21 NOTE — Discharge Instructions (Signed)
Keep your R arm/hand elevated at all times possible.  Wear your arm sling when you will be sitting up or walking around to keep the arm from dangling down.  Cover the arm w/ kerlex or other similar soft dressing to cushion it from bumping into things/assist w/ pain.  If you develop worsening of the pain in your arm, fevers, severe new redness of the arm, or loss of strength or feeling in the arm you should present to the nearest Emergency Room for an evaluation.    IV Infiltration  Sometimes fluid from your IV leaks under your skin. This is called an infiltration. HOME CARE  Put an ice pack on the puffy (swollen) area until the puffiness goes down. Place the ice pack on the swollen area 4 times a day for 10 minutes at a time.  Put ice in a plastic bag.  Place the towel between your skin and the bag.  Keep the swollen area above your chest as much as you can until the swelling goes down. Prop your arm up on at least 3 to 4 pillows.  Avoid tight fitting clothing, watches, or jewelry near the swollen area. GET HELP RIGHT AWAY IF:   The skin around the place where the needle was put in becomes darker and peels.  You have red streaks on your skin from the place where the needle was put in.  Yellowish white fluid (pus) comes out from the place where the needle was put in.  You have a temperature by mouth of 102 F (38.9 C). MAKE SURE YOU:  Understand these instructions.  Will watch your condition.  Will get help right away if you are not doing well or get worse.   This information is not intended to replace advice given to you by your health care provider. Make sure you discuss any questions you have with your health care provider.   Document Released: 03/09/2008 Document Revised: 08/23/2011 Document Reviewed: 03/19/2009 Elsevier Interactive Patient Education 2016 Elsevier Inc.  Dysphagia Diet Level 1, Pureed - with Nectar thick liquids  The dysphasia level 1 diet includes foods  that are completely pureed and smooth. The foods have a pudding-like texture, such as the texture of pureed pancakes, mashed potatoes, and yogurt. The diet does not include foods with lumps or coarse textures. Liquids should be smooth and may either be thin, nectar-thick, honey-like, or spoon-thick. This diet is helpful for people with moderate to severe swallowing problems. It reduces the risk of food getting caught in the windpipe, trachea, or lungs. You may need help or supervision during meals while following this diet. WHAT DO I NEED TO KNOW ABOUT THIS DIET? Foods  You may eat foods that are soft and have a pudding-like texture. If a food does not have this texture, you may be able to eat the food after:  Pureeing it. This can be done with a blender or whisk.  Moistening it with liquid. For example, you may have bread if you soak it in milk or syrup.  Avoid foods that are hard, dry, sticky, chunky, lumpy, or stringy. Also avoid foods with nuts, seeds, raisins, skins, and pulp.  Do not eat foods that you have to chew. If you have to chew the food, then you cannot eat it.  Eat a variety of foods to get all the nutrients you need. Liquids  You may drink liquids that are smooth. Your health care provider will tell you if you should drink thin or thickened  liquids.  To thicken a liquid, use a food and beverage thickener or a thickening food. Thickened liquids are usually a "pudding-like" consistency.  Thin liquids include fruit juices, milk, coffee, tea, yogurts, shakes, and similar foods that melt to thin liquid at room temperature.  Avoid liquids with seeds, pulp, or chunks. See your dietitian or health care provider regularly for help with your dietary changes. WHAT FOODS CAN I EAT? Grains Store-bought soft breads, pancakes, and [place] toast that have a smooth, moist texture and do not have nuts or seeds (you will need to moisten the food with liquid). Cooked cereals that have a  pudding-like consistency, such as cream of wheat or farina (no oatmeal). Pureed, well-cooked pasta, rice, and plain bread stuffing. Vegetables Pureed vegetables. Soft avocado. Smooth tomato paste or sauce. Strained or pureed soups (these may need to be thickened as directed). Mashed or pureed potatoes without skin (can be seasoned with butter, smooth gravy, margarine, or sour cream). Fruits Pureed fruits such as melons and apples without seeds or pulp. Mashed bananas. Smooth tomato paste or sauce. Fruit juices without pulp or seeds. Strained or pureed soups. Meat and Other Protein Sources Pureed meat. Smooth pate or liverwurst. Smooth souffles. Pureed beans (such as lentils). Pureed eggs. Dairy Yogurt. Smooth cheese sauces. Milk (may need to be thickened). Nutritional dairy drinks or shakes. Ask your health care provider whether you can have ice cream. Condiments Finely ground salt, pepper, and other ground spices. Sweets/Desserts Smooth puddings and custards. Pureed desserts. Souffles. Whipped topping. Ask your health care provider whether you can have frozen desserts. Fats and Oils Butter. Margarine. Smooth and strained gravy. Sour cream. Mayonnaise. Cream cheese. Whipped topping. Smooth sauces (such as white sauce, cheese sauce, or hollandaise sauce). The items listed above may not be a complete list of recommended foods or beverages. Contact your dietitian for more options. WHAT FOODS ARE NOT RECOMMENDED? Grains Oatmeal. Dry cereals. Hard breads. Vegetables Whole vegetables. Stringy vegetables (such as celery). Thin tomato sauce. Fruits Whole fresh, frozen, canned, or dried fruits that have not been pureed. Stringy fruits (such as pineapple). Meat and Other Protein Sources Whole or ground meat, fish, or poultry. Dried or cooked lentils or legumes that have been cooked but not mashed or pureed. Non-pureed eggs. Nuts and seeds. Peanut butter. Dairy Non-pureed cheese. Dairy products with  lumps or chunks. Ask your health care provider whether you can have ice cream. Condiments Coarse or seeded herbs and spices. Sweets/Desserts [place] preserves. Jams with seeds. Solid desserts. Sticky, chewy sweets (such as licorice and caramel). Ask your health care provider whether you can have frozen desserts. Fats and Oils Sauces of fats with lumps or chunks. The items listed above may not be a complete list of foods and beverages to avoid. Contact your dietitian for more information.   This information is not intended to replace advice given to you by your health care provider. Make sure you discuss any questions you have with your health care provider.   Document Released: 05/31/2005 Document Revised: 06/21/2014 Document Reviewed: 05/14/2013 Elsevier Interactive Patient Education [employer].

## 2015-11-21 NOTE — Progress Notes (Addendum)
NCM spoke to April Wade # [phone] with [facility]. Requested fax to pt's NP, Deneise Lever # [phone], fax # [phone]. Faxed dc summary, H&P and discharge instructions to [facility]. Will call for ambulance transport. Jonnie Finner RN CCM Case Mgmt phone [phone]

## 2015-11-21 NOTE — Progress Notes (Signed)
Notified Bela Bonaparte, pt's sister, that she will be discharged today and [facility] will be transporting her home. Pt's sister reports that she will be home awaiting her arrival.

## 2015-11-21 NOTE — Discharge Summary (Signed)
DISCHARGE SUMMARY  Sherri Davidson  MR#: [id number]  DOB:May 31, 1956  Date of Admission: 11/08/2015 Date of Discharge: 11/21/2015  Attending Physician:Merlene Dante T  Patient's MPN:[id number]  Consults: Nephrology  Neurology Palliative Care Vasc Surgery  Hand Surgery   Disposition: D/C home per pt/family request - I do not expect her to do well at home, but the pt insists on d/c home and is not presently a candidate for SNF due to her need for ongoing PD, so we have little option as far as disposition is concerned   Follow-up Appts:     Follow-up Information    Follow up with [facility]. Schedule an appointment as soon as possible for a visit in 3 days.   Contact information:   [address] [place] [id number] [phone]       Tests Needing Follow-up: -monitoring of R arm/hand  -ongoing home PD  -recheck of K+ is suggested   Wound Care: -keep R arm elevated at all time when possible - wear an arm sling when up/ambulating to prevent dangling of the hand/arm -low air loss mattress suggested for home use to attempt to prevent skin breakdown   Special Diet Recomendations: D1 diet w/ nectar liquids  Discharge Diagnoses: Seizures / Todd's paralysis / acute encephalopathy Coffee ground emesis  Dysphagia End-stage renal disease on peritoneal dialysis Possible Urinary tract infection DM2 Hyperkalemia > Hypokalemia Pressure ulcer v/s MASD RLL lung mass vs Atelectasis vs Pneumonia Acute R hand swelling - IV dilantin infiltration - SVT of R arm MRSA screen +  Initial presentation: 60 y.o. female with history of CVA, HTN, CHF, hypothyroidism, DM2, and ESRD on peritoneal hemodialysis daily who presented with seizures. Around 1pm the patient was noted to have her first seizure that lasted 5-6 minutes with generalized tonic-clonic movements. EMS was called and patient was able to return to baseline, at which time she  refused transport to the hospital. Around 2:18 PM she had a second seizure with acute left-sided facial drooping for which EMS was again called. Patient was very lethargic and altered after her seizing stopped. In the ambulance the patient had a third seizure. Patient was recently taken off of [place] by her Neurologist (Dr. Melrose Nakayama at [facility]). Dilantin was stopped in 06/2015 after the patient had repeated falls and was noted to be sedated. Her last prior seizure activity was 05/2015.  In the ED CT and MRI of the brain showed previous right sided CVA, but no acute abnormalities. UA was suggestive on an infections. UDS negative. Patient was evaluated by Neurology who recommended continuation of Keppra 500 mg daily and Dilantin 100 mg 3 times a day.   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 Encephalopathy appears to have resolved - dilatin dose has been decreased and is being given only at night - ammonia normal - B12 and folate are not low - MRI this admit w/o acute findings - pt lives w/ her sister who is reportedly available to assist her 24/7  Coffee ground emesis  Began 6/1 - heparin products stopped - pt on PPI - GI evaluated and did not feel EGD was indicated - had another episode 6/5, but none since - Hgb stable at time of d/c - the pt is to resume her usual ASA and Plavix 1 week post d/c   Dysphagia SLP has evaluated and suggests D1 diet w/ nectar liquids - appears to be tolerating at time of d/c   End-stage  renal disease on peritoneal dialysis PD per Nephrology - pt will NOT be a SNF candidate as long as she is on PD - only option for SNF placement will be to convert to HD - Palliative Care consulted to help establish goals of care and pt insisted on remaining full code - pt and her sister (caretaker) desire d/c home - to continue PD at home   Possible Urinary tract infection Urine was abnormal, but unclear how often pt urinates/if she makes  regular urine - completed 4 days of abx tx   DM2 CBG reasonably controlled at time of d/c   Hyperkalemia > Hypokalemia Resolved w/ resumption of PD   Pressure ulcer v/s MASD To use low air loss mattress   RLL lung mass vs Atelectasis vs Pneumonia -Per Ms.Sherri Davidson sister did not want any further workup - I agree she would not be a candidate for aggressive tx of a lung CA - no clinical signs to suggest an active pulmonary infection   Acute R hand swelling - IV dilantin infiltration - SVT of R arm Arterial dopplers suggest adequate flow, but noted superficial thrombus in R cephalic and basilic veins - no indication for interventions per Vasc Surg or Hand Surgery - pt to keep arm elevated as much as possible - wear sling when up to prevent dangling of arm - dress in kerlex prn to prevent bumping of arm which is very painful to her   MRSA screen +    Medication List    STOP taking these medications        ciprofloxacin 500 MG tablet  Commonly known as:  CIPRO     hydrOXYzine 25 MG capsule  Commonly known as:  VISTARIL     loperamide 2 MG capsule  Commonly known as:  IMODIUM     ondansetron 4 MG tablet  Commonly known as:  ZOFRAN     oxyCODONE-acetaminophen 5-325 MG tablet  Commonly known as:  PERCOCET/ROXICET     promethazine 25 MG tablet  Commonly known as:  PHENERGAN     ranitidine 150 MG tablet  Commonly known as:  ZANTAC      TAKE these medications        aspirin EC 81 MG tablet  Take 1 tablet (81 mg total) by mouth daily. For heart protection  Start taking on:  11/28/2015     citalopram 10 MG tablet  Commonly known as:  CELEXA  Take 10 mg by mouth daily as needed (depression).     clopidogrel 75 MG tablet  Commonly known as:  PLAVIX  Take 1 tablet (75 mg total) by mouth daily. For heart protection  Start taking on:  11/28/2015     gentamicin 0.3 % ophthalmic solution  Commonly known as:  GARAMYCIN  Apply 2 drops topically daily. Apply to wound site  daily     levETIRAcetam 500 MG tablet  Commonly known as:  KEPPRA  Take 1 tablet (500 mg total) by mouth 2 (two) times daily.     levothyroxine 50 MCG tablet  Commonly known as:  SYNTHROID, LEVOTHROID  Take 50 mcg by mouth daily. For hypothyroidism     midodrine 5 MG tablet  Commonly known as:  PROAMATINE  Take 1 tablet (5 mg total) by mouth 3 (three) times daily with meals.     multivitamin Tabs tablet  Take 1 tablet by mouth at bedtime.     pantoprazole 40 MG tablet  Commonly known as:  PROTONIX  Take  40 mg by mouth daily.     phenytoin 100 MG ER capsule  Commonly known as:  DILANTIN  Take 1 capsule (100 mg total) by mouth at bedtime.     potassium chloride SA 20 MEQ tablet  Commonly known as:  K-DUR,KLOR-CON  Take 20 mEq by mouth at bedtime.     RESOURCE THICKENUP CLEAR Powd  Use as per product instructions to thicken liquids to nectar thick consistency.     simvastatin 20 MG tablet  Commonly known as:  ZOCOR  Take 20 mg by mouth at bedtime. For elevated cholesterol     Vitamin D (Ergocalciferol) 50000 units Caps capsule  Commonly known as:  DRISDOL  Take 50,000 Units by mouth every 30 (thirty) days. On or about the 1st of each month        Day of Discharge BP 106/61 mmHg  Pulse 87  Temp(Src) 98.1 F (36.7 C) (Oral)  Resp 16  Ht '5\' 6"'$  (1.676 m)  Wt 61.6 kg (135 lb 12.9 oz)  BMI 21.93 kg/m2  SpO2 99%  Physical Exam: General: No acute respiratory distress - alert and conversant  Lungs: Clear to auscultation bilaterally - no wheezing or focal crackles  Cardiovascular: Regular rate and rhythm without murmur gallop or rub normal S1 and S2 Abdomen: Nontender, nondistended, soft, bowel sounds positive, no rebound, no ascites, no appreciable mass Extremities: No significant cyanosis, clubbing, or edema bilateral lower extremities - R UE w/ purplish discoloration of hand/fingers, but intact movement - some weeping from forearm/blistering of hand   Basic  Metabolic Panel:  Recent Labs Lab 11/15/15 0240 11/16/15 0634 11/17/15 0356 11/18/15 0327 11/19/15 0715 11/20/15 0500  NA 139 135 137 134* 135 135  K 3.6 3.2* 3.1* 3.1* 3.4* 4.8  CL 99* 98* 100* 98* 101 103  CO2 '27 24 24 24 22 23  '$ GLUCOSE 75 95 48* 159* 104* 166*  BUN 23* '18 20 20 19 '$ 21*  CREATININE 5.28* 4.76* 4.74* 4.50* 4.40* 4.04*  CALCIUM 9.0 8.3* 7.9* 8.1* 8.3* 8.3*  MG 1.8 1.6* 2.2  --   --   --   PHOS 6.2*  --  5.9* 6.0* 5.5* 4.2    Liver Function Tests:  Recent Labs Lab 11/15/15 0240 11/17/15 0356 11/18/15 0327 11/19/15 0715 11/20/15 0500  AST 18 18  --   --   --   ALT 8* 10*  --   --   --   ALKPHOS 83 77  --   --   --   BILITOT 0.2* 0.4  --   --   --   PROT 6.8 5.7*  --   --   --   ALBUMIN 1.9* 1.7* 1.8* 1.5* 1.6*   CBC:  Recent Labs Lab 11/15/15 0240 11/16/15 0634 11/17/15 0356 11/17/15 1441 11/18/15 0327 11/19/15 0715 11/20/15 0345  WBC 14.8* 13.8* 14.0* 11.8* 10.7* 7.7 8.5  NEUTROABS 10.5* 9.6* 8.4*  --  6.8 4.3  --   HGB 11.4* 11.0* 9.7* 10.4* 10.1* 9.4* 10.0*  HCT 38.7 37.1 31.9* 34.0* 32.3* 30.5* 32.6*  MCV 88.4 87.7 86.4 88.8 87.5 85.9 86.7  PLT 330 292 279 261 241 223 252    CBG:  Recent Labs Lab 11/20/15 1136 11/20/15 1719 11/20/15 2149 11/21/15 0805 11/21/15 1123  GLUCAP 211* 114* 217* 161* 162*    Recent Results (from the past 240 hour(s))  C difficile quick scan w PCR reflex     Status: None   Collection Time: 11/12/15 10:47  PM  Result Value Ref Range Status   C Diff antigen NEGATIVE NEGATIVE Final   C Diff toxin NEGATIVE NEGATIVE Final   C Diff interpretation Negative for toxigenic C. difficile  Final  Culture, blood (routine x 2)     Status: None   Collection Time: 11/15/15  2:47 PM  Result Value Ref Range Status   Specimen Description BLOOD RIGHT WRIST  Final   Special Requests IN PEDIATRIC BOTTLE 1.5 CC  Final   Culture NO GROWTH 5 DAYS  Final   Report Status 11/20/2015 FINAL  Final  Culture, blood (routine x  2)     Status: None   Collection Time: 11/15/15  2:51 PM  Result Value Ref Range Status   Specimen Description BLOOD RIGHT WRIST  Final   Special Requests IN PEDIATRIC BOTTLE 1.5CC  Final   Culture NO GROWTH 5 DAYS  Final   Report Status 11/20/2015 FINAL  Final      Time spent in discharge (includes decision making & examination of pt): >30 minutes  11/21/2015, 12:19 PM   Cherene Altes, MD [facility] Office  [phone] Pager [phone]  On-Call/Text Page:      [email or web address]      password [facility]

## 2015-11-21 NOTE — Progress Notes (Signed)
S:    Eating well.  Says she is to be DC'd O:BP 106/61 mmHg  Pulse 87  Temp(Src) 98.1 F (36.7 C) (Oral)  Resp 16  Ht '5\' 6"'$  (1.676 m)  Wt 61.6 kg (135 lb 12.9 oz)  BMI 21.93 kg/m2  SpO2 99%  Intake/Output Summary (Last 24 hours) at 11/21/15 0959 Last data filed at 11/21/15 0916  Gross per 24 hour  Intake    598 ml  Output      0 ml  Net    598 ml   Weight change: -0.8 kg (-1 lb 12.2 oz) Gen: Awake and alert CVS: RRR Resp:  clear Abd: + BS NT ND.  PD exit site wrapped Ext: No edema except Rt arm with mild swelling and erythema NEURO: CNI Ox2.  More alert   . darbepoetin (ARANESP) injection - NON-DIALYSIS  60 mcg Subcutaneous Q Mon-1800  . dialysis solution for CAPD/CCPD (DELFLEX)   Peritoneal Dialysis QHS  . feeding supplement  1 Container Oral TID BM  . gentamicin cream  1 application Topical Daily  . gentamicin cream  1 application Topical Daily  . insulin aspart  0-15 Units Subcutaneous TID WC  . insulin aspart  0-5 Units Subcutaneous QHS  . levETIRAcetam  500 mg Oral BID  . levothyroxine  50 mcg Oral QAC breakfast  . midodrine  5 mg Oral TID WC  . multivitamin  1 tablet Oral QHS  . ondansetron (ZOFRAN) IV  4 mg Intravenous Q6H  . pantoprazole  40 mg Oral BID  . phenytoin  100 mg Oral QHS  . potassium chloride  20 mEq Oral Daily   No results found. BMET    Component Value Date/Time   NA 135 11/20/2015 0500   NA 135* 05/31/2014 0455   K 4.8 11/20/2015 0500   K 3.0* 05/31/2014 0455   CL 103 11/20/2015 0500   CL 98 05/31/2014 0455   CO2 23 11/20/2015 0500   CO2 21 05/31/2014 0455   GLUCOSE 166* 11/20/2015 0500   GLUCOSE 223* 05/31/2014 0455   BUN 21* 11/20/2015 0500   BUN 35* 05/31/2014 0455   CREATININE 4.04* 11/20/2015 0500   CREATININE 5.19* 05/31/2014 0455   CALCIUM 8.3* 11/20/2015 0500   CALCIUM 9.2 05/31/2014 0455   GFRNONAA 11* 11/20/2015 0500   GFRNONAA 9* 05/31/2014 0455   GFRNONAA 11* 07/16/2013 0420   GFRAA 13* 11/20/2015 0500   GFRAA 11*  05/31/2014 0455   GFRAA 13* 07/16/2013 0420   CBC    Component Value Date/Time   WBC 8.5 11/20/2015 0345   WBC 17.4* 05/31/2014 0455   RBC 3.76* 11/20/2015 0345   RBC 3.69* 05/31/2014 0455   HGB 10.0* 11/20/2015 0345   HGB 11.3* 05/31/2014 1130   HCT 32.6* 11/20/2015 0345   HCT 35.0 05/31/2014 0455   PLT 252 11/20/2015 0345   PLT 282 05/31/2014 0455   MCV 86.7 11/20/2015 0345   MCV 95 05/31/2014 0455   MCH 26.6 11/20/2015 0345   MCH 31.7 05/31/2014 0455   MCHC 30.7 11/20/2015 0345   MCHC 33.4 05/31/2014 0455   RDW 19.1* 11/20/2015 0345   RDW 12.0 05/31/2014 0455   LYMPHSABS 2.2 11/19/2015 0715   LYMPHSABS 2.1 05/31/2014 0455   MONOABS 1.0 11/19/2015 0715   MONOABS 0.9 05/31/2014 0455   EOSABS 0.2 11/19/2015 0715   EOSABS 0.0 05/31/2014 0455   BASOSABS 0.0 11/19/2015 0715   BASOSABS 0 05/31/2014 0801   BASOSABS 0.0 05/31/2014 0455  Assessment: 1. Seizures 2. Anemia on aranesp 3. ESRD on CCPD. On 1.5% for all exchanges. Net UF 647cc yest  Will cont current script 4. Hypokalemia, on replacement, K better Plan: 1. Cont CCPD 2. Note plans for possible DC. She should FU will her nephrologist    Sherri Davidson T

## 2015-12-02 ENCOUNTER — Observation Stay
Admission: EM | Admit: 2015-12-02 | Discharge: 2015-12-03 | Disposition: A | Discharge status: Home / Self Care | Payer: Medicare (Managed Care) | Attending: Internal Medicine | Admitting: Internal Medicine

## 2015-12-02 ENCOUNTER — Emergency Department: Payer: Medicare (Managed Care)

## 2015-12-02 ENCOUNTER — Encounter: Payer: Self-pay | Admitting: Intensive Care

## 2015-12-02 DIAGNOSIS — Z8673 Personal history of transient ischemic attack (TIA), and cerebral infarction without residual deficits: Secondary | ICD-10-CM | POA: Diagnosis not present

## 2015-12-02 DIAGNOSIS — K209 Esophagitis, unspecified without bleeding: Secondary | ICD-10-CM

## 2015-12-02 DIAGNOSIS — F17210 Nicotine dependence, cigarettes, uncomplicated: Secondary | ICD-10-CM | POA: Insufficient documentation

## 2015-12-02 DIAGNOSIS — G40909 Epilepsy, unspecified, not intractable, without status epilepticus: Secondary | ICD-10-CM | POA: Insufficient documentation

## 2015-12-02 DIAGNOSIS — N186 End stage renal disease: Secondary | ICD-10-CM | POA: Diagnosis not present

## 2015-12-02 DIAGNOSIS — E039 Hypothyroidism, unspecified: Secondary | ICD-10-CM | POA: Insufficient documentation

## 2015-12-02 DIAGNOSIS — Z7982 Long term (current) use of aspirin: Secondary | ICD-10-CM | POA: Diagnosis not present

## 2015-12-02 DIAGNOSIS — Z7902 Long term (current) use of antithrombotics/antiplatelets: Secondary | ICD-10-CM | POA: Diagnosis not present

## 2015-12-02 DIAGNOSIS — K920 Hematemesis: Secondary | ICD-10-CM | POA: Diagnosis not present

## 2015-12-02 DIAGNOSIS — I509 Heart failure, unspecified: Secondary | ICD-10-CM | POA: Diagnosis not present

## 2015-12-02 DIAGNOSIS — Z992 Dependence on renal dialysis: Secondary | ICD-10-CM | POA: Diagnosis not present

## 2015-12-02 DIAGNOSIS — I132 Hypertensive heart and chronic kidney disease with heart failure and with stage 5 chronic kidney disease, or end stage renal disease: Secondary | ICD-10-CM | POA: Diagnosis not present

## 2015-12-02 DIAGNOSIS — Z85118 Personal history of other malignant neoplasm of bronchus and lung: Secondary | ICD-10-CM | POA: Insufficient documentation

## 2015-12-02 DIAGNOSIS — E1122 Type 2 diabetes mellitus with diabetic chronic kidney disease: Secondary | ICD-10-CM | POA: Insufficient documentation

## 2015-12-02 DIAGNOSIS — Z8719 Personal history of other diseases of the digestive system: Secondary | ICD-10-CM | POA: Diagnosis not present

## 2015-12-02 DIAGNOSIS — E785 Hyperlipidemia, unspecified: Secondary | ICD-10-CM | POA: Insufficient documentation

## 2015-12-02 HISTORY — DX: Malignant (primary) neoplasm, unspecified: C80.1

## 2015-12-02 HISTORY — DX: Acute embolism and thrombosis of unspecified vein: I82.90

## 2015-12-02 LAB — COMPREHENSIVE METABOLIC PANEL
ALK PHOS: 171 U/L — AB (ref 38–126)
ALT: 34 U/L (ref 14–54)
ANION GAP: 12 (ref 5–15)
AST: 46 U/L — ABNORMAL HIGH (ref 15–41)
Albumin: 2.4 g/dL — ABNORMAL LOW (ref 3.5–5.0)
BUN: 24 mg/dL — ABNORMAL HIGH (ref 6–20)
CALCIUM: 8.9 mg/dL (ref 8.9–10.3)
CO2: 22 mmol/L (ref 22–32)
Chloride: 104 mmol/L (ref 101–111)
Creatinine, Ser: 5.23 mg/dL — ABNORMAL HIGH (ref 0.44–1.00)
GFR calc non Af Amer: 8 mL/min — ABNORMAL LOW (ref >60)
GFR, EST AFRICAN AMERICAN: 9 mL/min — AB (ref >60)
Glucose, Bld: 152 mg/dL — ABNORMAL HIGH (ref 65–99)
Potassium: 3.8 mmol/L (ref 3.5–5.1)
SODIUM: 138 mmol/L (ref 135–145)
TOTAL PROTEIN: 7 g/dL (ref 6.5–8.1)
Total Bilirubin: 0.4 mg/dL (ref 0.3–1.2)

## 2015-12-02 LAB — TYPE AND SCREEN
ABO/RH(D): O NEG
Antibody Screen: NEGATIVE

## 2015-12-02 LAB — TSH: TSH: 7.062 u[IU]/mL — AB (ref 0.350–4.500)

## 2015-12-02 LAB — LIPASE, BLOOD: Lipase: 17 U/L (ref 11–51)

## 2015-12-02 LAB — CBC WITH DIFFERENTIAL/PLATELET
Basophils Absolute: 0.1 10*3/uL (ref 0–0.1)
Basophils Relative: 1 %
EOS ABS: 0.1 10*3/uL (ref 0–0.7)
HCT: 36.4 % (ref 36.0–46.0)
HEMOGLOBIN: 12.3 g/dL (ref 12.0–15.0)
LYMPHS ABS: 1 10*3/uL (ref 1.0–3.6)
Lymphocytes Relative: 10 %
MCH: 29.5 pg (ref 26.0–34.0)
MCHC: 33.9 g/dL (ref 30.0–36.0)
MCV: 87 fL (ref 78.0–100.0)
MONO ABS: 0.3 10*3/uL (ref 0.2–0.9)
Neutro Abs: 8.6 10*3/uL — ABNORMAL HIGH (ref 1.4–6.5)
Platelets: 299 10*3/uL (ref 150–400)
RBC: 4.18 MIL/uL (ref 3.87–5.11)
RDW: 19.1 % (ref 11.5–15.5)
WBC: 10.1 10*3/uL (ref 4.0–10.5)

## 2015-12-02 LAB — HEMOGLOBIN
HEMOGLOBIN: 11.6 g/dL — AB (ref 12.0–16.0)
Hemoglobin: 11.3 g/dL — ABNORMAL LOW (ref 12.0–16.0)

## 2015-12-02 LAB — PROTIME-INR
INR: 1.08
Prothrombin Time: 14.2 seconds (ref 11.4–15.0)

## 2015-12-02 MED ORDER — PHENYTOIN SODIUM EXTENDED 100 MG PO CAPS
100.0000 mg | ORAL_CAPSULE | Freq: Every day | ORAL | Status: DC
Start: 2015-12-02 — End: 2015-12-03
  Filled 2015-12-02: qty 1

## 2015-12-02 MED ORDER — PROMETHAZINE HCL 25 MG/ML IJ SOLN
25.0000 mg | Freq: Once | INTRAMUSCULAR | Status: AC
  Administered 2015-12-02: 25 mg via INTRAVENOUS
  Filled 2015-12-02: qty 1

## 2015-12-02 MED ORDER — RENA-VITE PO TABS
1.0000 | ORAL_TABLET | Freq: Every day | ORAL | Status: DC
  Filled 2015-12-02: qty 1

## 2015-12-02 MED ORDER — PANTOPRAZOLE SODIUM 40 MG PO PACK
40.0000 mg | PACK | Freq: Two times a day (BID) | ORAL | Status: DC
  Administered 2015-12-03: 40 mg via ORAL
  Filled 2015-12-02 (×3): qty 20

## 2015-12-02 MED ORDER — LEVOTHYROXINE SODIUM 50 MCG PO TABS
50.0000 ug | ORAL_TABLET | Freq: Every day | ORAL | Status: DC
  Filled 2015-12-02: qty 1

## 2015-12-02 MED ORDER — CETYLPYRIDINIUM CHLORIDE 0.05 % MT LIQD
7.0000 mL | Freq: Two times a day (BID) | OROMUCOSAL | Status: DC
  Administered 2015-12-03: 7 mL via OROMUCOSAL

## 2015-12-02 MED ORDER — FAMOTIDINE IN NACL 20-0.9 MG/50ML-% IV SOLN
20.0000 mg | Freq: Once | INTRAVENOUS | Status: AC
  Administered 2015-12-02: 20 mg via INTRAVENOUS
  Filled 2015-12-02: qty 50

## 2015-12-02 MED ORDER — SODIUM CHLORIDE 0.9 % IV BOLUS (SEPSIS)
500.0000 mL | Freq: Once | INTRAVENOUS | Status: AC
  Administered 2015-12-02: 500 mL via INTRAVENOUS

## 2015-12-02 MED ORDER — ONDANSETRON HCL 4 MG/2ML IJ SOLN
4.0000 mg | Freq: Once | INTRAMUSCULAR | Status: AC
  Administered 2015-12-02: 4 mg via INTRAVENOUS

## 2015-12-02 MED ORDER — SIMVASTATIN 20 MG PO TABS
20.0000 mg | ORAL_TABLET | Freq: Every day | ORAL | Status: DC
  Filled 2015-12-02: qty 1

## 2015-12-02 MED ORDER — MIDODRINE HCL 5 MG PO TABS
5.0000 mg | ORAL_TABLET | Freq: Three times a day (TID) | ORAL | Status: DC
  Filled 2015-12-02: qty 1

## 2015-12-02 MED ORDER — LORAZEPAM 2 MG/ML IJ SOLN
1.0000 mg | Freq: Once | INTRAMUSCULAR | Status: AC
  Administered 2015-12-02: 1 mg via INTRAVENOUS

## 2015-12-02 MED ORDER — POTASSIUM CHLORIDE CRYS ER 20 MEQ PO TBCR
20.0000 meq | EXTENDED_RELEASE_TABLET | Freq: Every day | ORAL | Status: DC
  Administered 2015-12-03: 20 meq via ORAL
  Filled 2015-12-02: qty 1

## 2015-12-02 MED ORDER — LOPERAMIDE HCL 2 MG PO CAPS: 2.0000 mg | ORAL_CAPSULE | ORAL | Status: DC | PRN

## 2015-12-02 MED ORDER — LEVETIRACETAM 500 MG PO TABS
500.0000 mg | ORAL_TABLET | Freq: Two times a day (BID) | ORAL | Status: DC
  Filled 2015-12-02 (×4): qty 1

## 2015-12-02 MED ORDER — DELFLEX-LC/1.5% DEXTROSE 346 MOSM/L IP SOLN
INTRAPERITONEAL | Status: DC
  Administered 2015-12-02: 19:00:00 via INTRAPERITONEAL
  Filled 2015-12-02 (×2): qty 3000

## 2015-12-02 MED ORDER — OXYCODONE-ACETAMINOPHEN 5-325 MG PO TABS: 1.0000 | ORAL_TABLET | Freq: Every day | ORAL | Status: DC | PRN

## 2015-12-02 MED ORDER — LORAZEPAM 2 MG/ML IJ SOLN
INTRAMUSCULAR | Status: AC
  Administered 2015-12-02: 1 mg via INTRAVENOUS
  Filled 2015-12-02: qty 1

## 2015-12-02 MED ORDER — ONDANSETRON HCL 4 MG/2ML IJ SOLN
INTRAMUSCULAR | Status: AC
  Administered 2015-12-02: 4 mg via INTRAVENOUS
  Filled 2015-12-02: qty 2

## 2015-12-02 MED ORDER — ONDANSETRON HCL 4 MG/2ML IJ SOLN
4.0000 mg | Freq: Once | INTRAMUSCULAR | Status: AC
  Administered 2015-12-02: 4 mg via INTRAVENOUS
  Filled 2015-12-02: qty 2

## 2015-12-02 MED ORDER — GENTAMICIN SULFATE 0.1 % EX CREA
1.0000 "application " | TOPICAL_CREAM | Freq: Every day | CUTANEOUS | Status: DC
  Filled 2015-12-02: qty 15

## 2015-12-02 NOTE — H&P (Signed)
[facility] NAME: Sherri Davidson    MR#:  [id number]  DATE OF BIRTH:  02/21/1956  DATE OF ADMISSION:  12/02/2015  PRIMARY CARE PHYSICIAN: [facility]   REQUESTING/REFERRING PHYSICIAN: Darl Householder  CHIEF COMPLAINT:   Chief Complaint  Patient presents with  . GI Bleeding    HISTORY OF PRESENT ILLNESS: Sherri Davidson  is a 60 y.o. female with a known history of Cerebrovascular accident with hemiparesis, hypertension, diabetes, end-stage renal disease on peritoneal dialysis, congestive heart failure, hypothyroidism, seizure disorder, lung cancer, superficial venous clot in the right upper extremity recently, hematemesis in the recent admission 3 weeks ago without any further GI workup because of stable hemoglobin. Sent from her group home with hematemesis. Patient is not oriented so she is not able to give me any further details, this history obtained from ER physician and her past records.  PAST MEDICAL HISTORY:   Past Medical History  Diagnosis Date  . CVA, old, hemiparesis ([place])   . Hypertension   . Diabetes ([place])   . ESRD on peritoneal dialysis ([place])   . CHF (congestive heart failure) ([place])   . Hypothyroidism   . Seizures ([place])   . Stroke ([place])   . Cancer ([place])     lung cancer  . Blood clot in vein right hand    PAST SURGICAL HISTORY: Past Surgical History  Procedure Laterality Date  . Cholecystectomy    . Amputation toe    . Insertion of dialysis catheter      SOCIAL HISTORY:  Social History  Substance Use Topics  . Smoking status: Current Every Day Smoker -- 0.50 packs/day for 30 years    Types: Cigarettes  . Smokeless tobacco: Not on file  . Alcohol Use: No    FAMILY HISTORY:  Family History  Problem Relation Age of Onset  . Family history unknown: Yes  Because of patient's mental status currently she is not able to give me any details.  DRUG ALLERGIES:  Allergies  Allergen Reactions  .  Adhesive [Tape] Hives    Please use paper tape  . Salicylates Hives and Nausea Only    Uncoated.  Diona Fanti [Aspirin] Other (See Comments)    GI upset for non-enteric coated aspirin    REVIEW OF SYSTEMS:   Because of mental status she is not able to give any review of system.  MEDICATIONS AT HOME:  Prior to Admission medications   Medication Sig Start Date End Date Taking? Authorizing Provider  aspirin EC 81 MG tablet Take 1 tablet (81 mg total) by mouth daily. For heart protection 11/28/15  Yes Cherene Altes, MD  clopidogrel (PLAVIX) 75 MG tablet Take 1 tablet (75 mg total) by mouth daily. For heart protection 11/28/15  Yes Cherene Altes, MD  levETIRAcetam (KEPPRA) 500 MG tablet Take 1 tablet (500 mg total) by mouth 2 (two) times daily. Patient taking differently: Take 500 mg by mouth 2 (two) times daily. For seizure prevention 05/28/15  Yes Vaughan Basta, MD  levothyroxine (SYNTHROID, LEVOTHROID) 50 MCG tablet Take 50 mcg by mouth daily. For hypothyroidism   Yes Historical Provider, MD  loperamide (IMODIUM) 2 MG capsule Take 2-4 mg by mouth as needed for diarrhea or loose stools.   Yes Historical Provider, MD  Maltodextrin-Xanthan Gum ([place]) POWD Use as per product instructions to thicken liquids to nectar thick consistency. 11/21/15  Yes Cherene Altes, MD  midodrine (PROAMATINE) 5 MG  tablet Take 1 tablet (5 mg total) by mouth 3 (three) times daily with meals. 11/21/15  Yes Cherene Altes, MD  multivitamin (RENA-VIT) TABS tablet Take 1 tablet by mouth at bedtime. 11/21/15  Yes Cherene Altes, MD  oxyCODONE-acetaminophen (PERCOCET/ROXICET) 5-325 MG tablet Take 1 tablet by mouth daily as needed for moderate pain or severe pain.   Yes Historical Provider, MD  pantoprazole (PROTONIX) 40 MG tablet Take 40 mg by mouth daily.    Yes Historical Provider, MD  phenytoin (DILANTIN) 100 MG ER capsule Take 1 capsule (100 mg total) by mouth at bedtime. 11/21/15  Yes  Cherene Altes, MD  potassium chloride SA (K-DUR,KLOR-CON) 20 MEQ tablet Take 20 mEq by mouth daily.    Yes Historical Provider, MD  ranitidine (ZANTAC) 150 MG tablet Take 150 mg by mouth daily as needed for heartburn.   Yes Historical Provider, MD  simvastatin (ZOCOR) 20 MG tablet Take 20 mg by mouth at bedtime.    Yes Historical Provider, MD  Vitamin D, Ergocalciferol, (DRISDOL) 50000 units CAPS capsule Take 50,000 Units by mouth every 30 (thirty) days. On or about the 1st of each month   Yes Historical Provider, MD  Zinc Oxide 13 % CREA Apply 1 application topically daily as needed (for barrier cream on buttocks while toileting).   Yes Historical Provider, MD      PHYSICAL EXAMINATION:   VITAL SIGNS: Blood pressure 171/108, pulse 88, temperature 97.6 F (36.4 C), temperature source Oral, resp. rate 16, height '5\' 6"'$  (1.676 m), weight 57.879 kg (127 lb 9.6 oz), SpO2 100 %.  GENERAL:  60 y.o.-year-old patient lying in the bed with no acute distress. She appears thin and malnourished, slightly drowsy but easily arousable but not oriented. EYES: Pupils equal, round, reactive to light and accommodation. No scleral icterus. Extraocular muscles intact.  HEENT: Head atraumatic, normocephalic. Oropharynx and nasopharynx clear.  NECK:  Supple, no jugular venous distention. No thyroid enlargement, no tenderness.  LUNGS: Normal breath sounds bilaterally, no wheezing, rales,rhonchi or crepitation. No use of accessory muscles of respiration.  CARDIOVASCULAR: S1, S2 normal. No murmurs, rubs, or gallops.  ABDOMEN: Soft, nontender, nondistended. Bowel sounds present. No organomegaly or mass.  EXTREMITIES: No pedal edema, cyanosis, or clubbing.  NEUROLOGIC: Patient is drowsy, easily arousable but not well oriented. She moves her limbs spontaneously but does not follow commands. PSYCHIATRIC: The patient is  Drowsy and not oriented.  SKIN: No obvious rash, lesion, or ulcer.   LABORATORY PANEL:    CBC  Recent Labs Lab 12/02/15 0903  WBC 10.1  HGB 12.3  HCT 36.4  PLT 299  MCV 87.0  MCH 29.5  MCHC 33.9  RDW 19.1  LYMPHSABS 1.0  MONOABS 0.3  EOSABS 0.1  BASOSABS 0.1   ------------------------------------------------------------------------------------------------------------------  Chemistries   Recent Labs Lab 12/02/15 0903  NA 138  K 3.8  CL 104  CO2 22  GLUCOSE 152*  BUN 24*  CREATININE 5.23*  CALCIUM 8.9  AST 46*  ALT 34  ALKPHOS 171*  BILITOT 0.4   ------------------------------------------------------------------------------------------------------------------ estimated creatinine clearance is 10.6 mL/min (by C-G formula based on Cr of 5.23). ------------------------------------------------------------------------------------------------------------------ No results for input(s): TSH, T4TOTAL, T3FREE, THYROIDAB in the last 72 hours.  Invalid input(s): FREET3   Coagulation profile  Recent Labs Lab 12/02/15 0903  INR 1.08   ------------------------------------------------------------------------------------------------------------------- No results for input(s): DDIMER in the last 72 hours. -------------------------------------------------------------------------------------------------------------------  Cardiac Enzymes No results for input(s): CKMB, TROPONINI, MYOGLOBIN in the last 168 hours.  Invalid input(s): CK ------------------------------------------------------------------------------------------------------------------ Invalid input(s): POCBNP  ---------------------------------------------------------------------------------------------------------------  Urinalysis    Component Value Date/Time   COLORURINE YELLOW 11/08/2015 1707   COLORURINE Yellow 05/30/2014 1618   APPEARANCEUR TURBID* 11/08/2015 1707   APPEARANCEUR Clear 05/30/2014 1618   LABSPEC 1.018 11/08/2015 1707   LABSPEC 1.009 05/30/2014 1618   PHURINE 7.5  11/08/2015 1707   PHURINE 7.0 05/30/2014 1618   GLUCOSEU 100* 11/08/2015 1707   GLUCOSEU >=500 05/30/2014 1618   HGBUR LARGE* 11/08/2015 1707   HGBUR Negative 05/30/2014 1618   BILIRUBINUR NEGATIVE 11/08/2015 1707   BILIRUBINUR Negative 05/30/2014 1618   KETONESUR NEGATIVE 11/08/2015 1707   KETONESUR Negative 05/30/2014 1618   PROTEINUR >300* 11/08/2015 1707   PROTEINUR 100 mg/dL 05/30/2014 1618   NITRITE NEGATIVE 11/08/2015 1707   NITRITE Negative 05/30/2014 1618   LEUKOCYTESUR LARGE* 11/08/2015 1707   LEUKOCYTESUR Negative 05/30/2014 1618     RADIOLOGY: Ct Abdomen Pelvis Wo Contrast  12/02/2015  CLINICAL DATA:  60 year old female with acute onset coffee ground emesis. Cough. Peritoneal dialysis patient. Abnormal right lower lobe. Initial encounter. EXAM: CT CHEST, ABDOMEN AND PELVIS WITHOUT CONTRAST TECHNIQUE: Multidetector CT imaging of the chest, abdomen and pelvis was performed following the standard protocol without IV contrast. COMPARISON:  Chest CT 09/23/2015.  CT Abdomen and Pelvis 11/14/2011. FINDINGS: CT CHEST New circumferential soft tissue thickening throughout the thoracic esophagus, up to 12 mm in thickness from just below the thoracic inlet to the gastroesophageal junction. No associated pneumomediastinum or pericardial effusion. No mediastinal lymphadenopathy. The abnormal esophageal thickening is inseparable at the level of the azygoesophageal recess from the continued moderate sized right pleural effusion or pleural collection. That collection appears unchanged since April, and as before there is thickening of at least the parietal layer of the pleura. Right lung ventilation also is unchanged since April with a 6.4 cm area of rounded right lower lobe opacity abutting the right pleural collection. There are minimal internal air bronchograms . There is no new or increased right lung opacity. The other major airways are clear. The left lung is stable and clear. Extensive  calcified aortic and coronary artery atherosclerosis. No thoracic lymphadenopathy. Stable visualized osseous structures. Bulky disc and/or endplate spurring in O8-C16 lower thoracic level resulting in moderate to severe T9-T10 spinal stenosis which has been present since 2013. CT ABDOMEN AND PELVIS No acute osseous abnormality identified. Left lower quadrant approach peritoneal dialysis catheter. Small volume pelvic free fluid. Negative rectum aside from retained stool. Negative noncontrast uterus and adnexa. Mildly to moderately distended urinary bladder. Negative sigmoid colon aside from redundancy and retained stool. Retained stool also throughout the left colon, transverse colon and right colon. Negative appendix. No dilated small bowel. The gastroesophageal junction appears fairly stable since 2013. No abnormality of the stomach or duodenum identified. No free air in the abdomen. Small volume of abdominal free fluid in the bilateral upper and lower quadrants. Severe calcified atherosclerosis of the aorta and its branches. Severe calcified atherosclerosis of the iliac and proximal femoral arteries. Surgically absent gallbladder as before. Negative noncontrast liver, spleen, and adrenal glands. Chronic pancreatic atrophy appears stable. There is mild to moderate chronic renal atrophy which appears mildly progressed since 2013. Extensive renal artery calcified atherosclerosis. No lymphadenopathy. IMPRESSION: 1. Severe circumferential esophageal wall thickening is new since April 2017 and most suggestive of acute esophagitis. No other acute findings in the mediastinum, and no mediastinal adenopathy. 2. Unchanged 6.4 cm area of rounded confluent opacity in the right lower lobe abutting a chronic  right pleural collection. This might reflect chronic fibrothorax or complex pleural effusion with round atelectasis, but Lung Carcinoma is possible. 3. No acute findings in the abdomen or pelvis. Left lower quadrant  peritoneal dialysis catheter with small volume of free fluid in the abdomen and pelvis. 4. Extensive calcified atherosclerosis of the aorta and its branches, including the coronary arteries. 5. Chronic T9-T10 spinal stenosis. Electronically Signed   By: Genevie Ann M.D.   On: 12/02/2015 11:18   Dg Chest 2 View  12/02/2015  CLINICAL DATA:  60 year old female with cough. Abnormal right lower lobe. Initial encounter. EXAM: CHEST  2 VIEW COMPARISON:  Portable chest 10/10/2015. Chest CT 09/23/2015 and earlier. FINDINGS: Seated AP and lateral views. Unresolved rounded masslike opacity in the right lower lobe (arrows). Superimposed right pleural effusion or pleural collection has not significantly changed. No superimposed pneumothorax or pulmonary edema. The left lung remain spared. Stable mediastinal contours. Visualized tracheal air column is within normal limits. No acute osseous abnormality identified. IMPRESSION: 1. Unresolved 6 cm mass-like opacity in the right lower lobe since March. Associated right pleural effusion or pleural collection has not significantly changed. Recommend further evaluation. 2. No new cardiopulmonary abnormality. Electronically Signed   By: Genevie Ann M.D.   On: 12/02/2015 09:11   Ct Chest Wo Contrast  12/02/2015  CLINICAL DATA:  59 year old female with acute onset coffee ground emesis. Cough. Peritoneal dialysis patient. Abnormal right lower lobe. Initial encounter. EXAM: CT CHEST, ABDOMEN AND PELVIS WITHOUT CONTRAST TECHNIQUE: Multidetector CT imaging of the chest, abdomen and pelvis was performed following the standard protocol without IV contrast. COMPARISON:  Chest CT 09/23/2015.  CT Abdomen and Pelvis 11/14/2011. FINDINGS: CT CHEST New circumferential soft tissue thickening throughout the thoracic esophagus, up to 12 mm in thickness from just below the thoracic inlet to the gastroesophageal junction. No associated pneumomediastinum or pericardial effusion. No mediastinal lymphadenopathy.  The abnormal esophageal thickening is inseparable at the level of the azygoesophageal recess from the continued moderate sized right pleural effusion or pleural collection. That collection appears unchanged since April, and as before there is thickening of at least the parietal layer of the pleura. Right lung ventilation also is unchanged since April with a 6.4 cm area of rounded right lower lobe opacity abutting the right pleural collection. There are minimal internal air bronchograms . There is no new or increased right lung opacity. The other major airways are clear. The left lung is stable and clear. Extensive calcified aortic and coronary artery atherosclerosis. No thoracic lymphadenopathy. Stable visualized osseous structures. Bulky disc and/or endplate spurring in C1-Y60 lower thoracic level resulting in moderate to severe T9-T10 spinal stenosis which has been present since 2013. CT ABDOMEN AND PELVIS No acute osseous abnormality identified. Left lower quadrant approach peritoneal dialysis catheter. Small volume pelvic free fluid. Negative rectum aside from retained stool. Negative noncontrast uterus and adnexa. Mildly to moderately distended urinary bladder. Negative sigmoid colon aside from redundancy and retained stool. Retained stool also throughout the left colon, transverse colon and right colon. Negative appendix. No dilated small bowel. The gastroesophageal junction appears fairly stable since 2013. No abnormality of the stomach or duodenum identified. No free air in the abdomen. Small volume of abdominal free fluid in the bilateral upper and lower quadrants. Severe calcified atherosclerosis of the aorta and its branches. Severe calcified atherosclerosis of the iliac and proximal femoral arteries. Surgically absent gallbladder as before. Negative noncontrast liver, spleen, and adrenal glands. Chronic pancreatic atrophy appears stable. There is mild to  moderate chronic renal atrophy which appears  mildly progressed since 2013. Extensive renal artery calcified atherosclerosis. No lymphadenopathy. IMPRESSION: 1. Severe circumferential esophageal wall thickening is new since April 2017 and most suggestive of acute esophagitis. No other acute findings in the mediastinum, and no mediastinal adenopathy. 2. Unchanged 6.4 cm area of rounded confluent opacity in the right lower lobe abutting a chronic right pleural collection. This might reflect chronic fibrothorax or complex pleural effusion with round atelectasis, but Lung Carcinoma is possible. 3. No acute findings in the abdomen or pelvis. Left lower quadrant peritoneal dialysis catheter with small volume of free fluid in the abdomen and pelvis. 4. Extensive calcified atherosclerosis of the aorta and its branches, including the coronary arteries. 5. Chronic T9-T10 spinal stenosis. Electronically Signed   By: Genevie Ann M.D.   On: 12/02/2015 11:18    EKG: Orders placed or performed during the hospital encounter of 11/08/15  . EKG 12-Lead  . EKG 12-Lead  . EKG    IMPRESSION AND PLAN:  * Hematemesis   Hemoglobin is currently stable, CT scan of the abdomen show severe esophagitis   I will monitor serial hemoglobin, Q Protonix twice a day, keep nothing by mouth except medications for now.  * Seizures disorder   Continue baseline seizure medications.  * Stroke in the past   Hold aspirin and Plavix currently because of hematemesis, continue the medications.  * Hypothyroidism   Continue levothyroxine, check TSH.  * Hyperlipidemia   Continue statin.  All the records are reviewed and case discussed with ED provider. Management plans discussed with the patient, family and they are in agreement.  CODE STATUS: Full code Code Status History    Date Active Date Inactive Code Status Order ID Comments User Context   11/08/2015 10:33 PM 11/21/2015  5:53 PM Full Code [id number]  Norval Morton, MD Inpatient   08/17/2015  8:07 AM 08/18/2015  6:39 PM Full Code  [id number]  Epifanio Lesches, MD ED   05/24/2015  3:42 AM 05/28/2015  5:34 PM Full Code [id number]  Saundra Shelling, MD Inpatient    Advance Directive Documentation        Most Recent Value   Type of Advance Directive  Healthcare Power of [place]   Pre-existing out of facility DNR order (yellow form or pink MOST form)     "MOST" Form in Place?         TOTAL TIME TAKING CARE OF THIS PATIENT: 50 minutes.    [name] on 12/02/2015   Between 7am to 6pm - Pager - [phone]  After 6pm go to [email or web address] - password [email or web address] [facility]  Office  [phone]  CC: Primary care physician; [facility]   Note: This dictation was prepared with Dragon dictation along with smaller phrase technology. Any transcriptional errors that result from this process are unintentional.

## 2015-12-02 NOTE — ED Notes (Signed)
Patient transported to X-ray 

## 2015-12-02 NOTE — ED Notes (Signed)
Pink sleeve placed on right arm.

## 2015-12-02 NOTE — ED Notes (Signed)
Rectal Exam hemoccult negative

## 2015-12-02 NOTE — ED Notes (Addendum)
Pt arrived by EMS from home. Patient was found by family this morning puking coffee ground emesis. Pt receives peritoneal dialysis at home administered by family. PT has HX lung cancer and blood clot in R hand that they do not plan on removing. EMS blood pressure 98/63. Pt A&O x4 on arrival

## 2015-12-02 NOTE — ED Provider Notes (Signed)
CSN: [id number]     Arrival date & time 12/02/15  0830 History   First MD Initiated Contact with Patient 12/02/15 [phone]     Chief Complaint  Patient presents with  . GI Bleeding     (Consider location/radiation/quality/duration/timing/severity/associated sxs/prior Treatment) The history is provided by the patient.  Sherri Davidson is a 60 y.o. female hx of HTN, DM, stroke, recent seizure, ESRD on HD (peritoneal dialysis last night), Here presenting with  Abdominal pain, vomiting, some cough. Patient finished peritoneal dialysis last night. Patient has some coffee-ground emesis and abdominal pain this morning. Patient came by EMS was noted to be borderline hypotensive 98/63. Patient states that she was recently admitted to [facility] for seizures and actually had some hematemesis that time but she had does not want to any further interventions. Also states that she was recently diagnosed with a blood clot in the right arm but was not put on blood thinners.      Past Medical History  Diagnosis Date  . CVA, old, hemiparesis ([place])   . Hypertension   . Diabetes ([place])   . ESRD on peritoneal dialysis ([place])   . CHF (congestive heart failure) ([place])   . Hypothyroidism   . Seizures ([place])   . Stroke ([place])   . Cancer ([place])     lung cancer  . Blood clot in vein right hand   Past Surgical History  Procedure Laterality Date  . Cholecystectomy    . Amputation toe    . Insertion of dialysis catheter     History reviewed. No pertinent family history. Social History  Substance Use Topics  . Smoking status: Current Every Day Smoker -- 0.50 packs/day for 30 years    Types: Cigarettes  . Smokeless tobacco: None  . Alcohol Use: No   OB History    No data available     Review of Systems  Gastrointestinal: Positive for nausea, vomiting and abdominal pain.  All other systems reviewed and are negative.     Allergies  Adhesive; Salicylates; and Asa  Home Medications   Prior to Admission  medications   Medication Sig Start Date End Date Taking? Authorizing Provider  clopidogrel (PLAVIX) 75 MG tablet Take 1 tablet (75 mg total) by mouth daily. For heart protection 11/28/15  Yes Cherene Altes, MD  levETIRAcetam (KEPPRA) 500 MG tablet Take 1 tablet (500 mg total) by mouth 2 (two) times daily. Patient taking differently: Take 500 mg by mouth 2 (two) times daily. For seizure prevention 05/28/15  Yes Vaughan Basta, MD  levothyroxine (SYNTHROID, LEVOTHROID) 50 MCG tablet Take 50 mcg by mouth daily. For hypothyroidism   Yes Historical Provider, MD  loperamide (IMODIUM) 2 MG capsule Take 2-4 mg by mouth as needed for diarrhea or loose stools.   Yes Historical Provider, MD  Maltodextrin-Xanthan Gum ([place]) POWD Use as per product instructions to thicken liquids to nectar thick consistency. 11/21/15  Yes Cherene Altes, MD  midodrine (PROAMATINE) 5 MG tablet Take 1 tablet (5 mg total) by mouth 3 (three) times daily with meals. 11/21/15  Yes Cherene Altes, MD  multivitamin (RENA-VIT) TABS tablet Take 1 tablet by mouth at bedtime. 11/21/15  Yes Cherene Altes, MD  oxyCODONE-acetaminophen (PERCOCET/ROXICET) 5-325 MG tablet Take 1 tablet by mouth daily as needed for moderate pain or severe pain.   Yes Historical Provider, MD  pantoprazole (PROTONIX) 40 MG tablet Take 40 mg by mouth daily.    Yes Historical Provider, MD  phenytoin (DILANTIN) 100 MG ER capsule Take 1 capsule (100 mg total) by mouth at bedtime. 11/21/15  Yes Cherene Altes, MD  potassium chloride SA (K-DUR,KLOR-CON) 20 MEQ tablet Take 20 mEq by mouth daily.    Yes Historical Provider, MD  ranitidine (ZANTAC) 150 MG tablet Take 150 mg by mouth daily as needed for heartburn.   Yes Historical Provider, MD  simvastatin (ZOCOR) 20 MG tablet Take 20 mg by mouth at bedtime.    Yes Historical Provider, MD  Vitamin D, Ergocalciferol, (DRISDOL) 50000 units CAPS capsule Take 50,000 Units by mouth every 30  (thirty) days. On or about the 1st of each month   Yes Historical Provider, MD  Zinc Oxide 13 % CREA Apply 1 application topically daily as needed (for barrier cream on buttocks while toileting).   Yes Historical Provider, MD  aspirin EC 81 MG tablet Take 1 tablet (81 mg total) by mouth daily. For heart protection 11/28/15   Cherene Altes, MD   BP 174/93 mmHg  Pulse 76  Temp(Src) 97.6 F (36.4 C) (Oral)  Resp 0  Ht '5\' 6"'$  (1.676 m)  Wt 127 lb 9.6 oz (57.879 kg)  BMI 20.61 kg/m2  SpO2 100% Physical Exam  Constitutional: She is oriented to person, place, and time.  Chronically ill   HENT:  Head: Normocephalic.  Eyes: Conjunctivae and EOM are normal. Pupils are equal, round, and reactive to light.  Neck: Normal range of motion. Neck supple.  Cardiovascular: Normal rate, regular rhythm and normal heart sounds.   Pulmonary/Chest: Effort normal.  Diminished bilateral bases   Abdominal: Soft. Bowel sounds are normal.  Peritoneal dialysis site with no erythema   Musculoskeletal: Normal range of motion.  R hand with eschar, ? Stage 1 ulcer on the R hand. Stage 1 sacral decub ulcer   Neurological: She is alert and oriented to person, place, and time.  Skin: Skin is warm and dry.  Psychiatric: She has a normal mood and affect. Her behavior is normal. Thought content normal.  Nursing note and vitals reviewed.   ED Course  Procedures (including critical care time) Labs Review Labs Reviewed  CBC WITH DIFFERENTIAL/PLATELET - Abnormal; Notable for the following:    Neutro Abs 8.6 (*)    All other components within normal limits  COMPREHENSIVE METABOLIC PANEL - Abnormal; Notable for the following:    Glucose, Bld 152 (*)    BUN 24 (*)    Creatinine, Ser 5.23 (*)    Albumin 2.4 (*)    AST 46 (*)    Alkaline Phosphatase 171 (*)    GFR calc non Af Amer 8 (*)    GFR calc Af Amer 9 (*)    All other components within normal limits  PROTIME-INR  LIPASE, BLOOD  TYPE AND SCREEN     Imaging Review Ct Abdomen Pelvis Wo Contrast  12/02/2015  CLINICAL DATA:  60 year old female with acute onset coffee ground emesis. Cough. Peritoneal dialysis patient. Abnormal right lower lobe. Initial encounter. EXAM: CT CHEST, ABDOMEN AND PELVIS WITHOUT CONTRAST TECHNIQUE: Multidetector CT imaging of the chest, abdomen and pelvis was performed following the standard protocol without IV contrast. COMPARISON:  Chest CT 09/23/2015.  CT Abdomen and Pelvis 11/14/2011. FINDINGS: CT CHEST New circumferential soft tissue thickening throughout the thoracic esophagus, up to 12 mm in thickness from just below the thoracic inlet to the gastroesophageal junction. No associated pneumomediastinum or pericardial effusion. No mediastinal lymphadenopathy. The abnormal esophageal thickening is inseparable at the level of  the azygoesophageal recess from the continued moderate sized right pleural effusion or pleural collection. That collection appears unchanged since April, and as before there is thickening of at least the parietal layer of the pleura. Right lung ventilation also is unchanged since April with a 6.4 cm area of rounded right lower lobe opacity abutting the right pleural collection. There are minimal internal air bronchograms . There is no new or increased right lung opacity. The other major airways are clear. The left lung is stable and clear. Extensive calcified aortic and coronary artery atherosclerosis. No thoracic lymphadenopathy. Stable visualized osseous structures. Bulky disc and/or endplate spurring in W0-J81 lower thoracic level resulting in moderate to severe T9-T10 spinal stenosis which has been present since 2013. CT ABDOMEN AND PELVIS No acute osseous abnormality identified. Left lower quadrant approach peritoneal dialysis catheter. Small volume pelvic free fluid. Negative rectum aside from retained stool. Negative noncontrast uterus and adnexa. Mildly to moderately distended urinary bladder.  Negative sigmoid colon aside from redundancy and retained stool. Retained stool also throughout the left colon, transverse colon and right colon. Negative appendix. No dilated small bowel. The gastroesophageal junction appears fairly stable since 2013. No abnormality of the stomach or duodenum identified. No free air in the abdomen. Small volume of abdominal free fluid in the bilateral upper and lower quadrants. Severe calcified atherosclerosis of the aorta and its branches. Severe calcified atherosclerosis of the iliac and proximal femoral arteries. Surgically absent gallbladder as before. Negative noncontrast liver, spleen, and adrenal glands. Chronic pancreatic atrophy appears stable. There is mild to moderate chronic renal atrophy which appears mildly progressed since 2013. Extensive renal artery calcified atherosclerosis. No lymphadenopathy. IMPRESSION: 1. Severe circumferential esophageal wall thickening is new since April 2017 and most suggestive of acute esophagitis. No other acute findings in the mediastinum, and no mediastinal adenopathy. 2. Unchanged 6.4 cm area of rounded confluent opacity in the right lower lobe abutting a chronic right pleural collection. This might reflect chronic fibrothorax or complex pleural effusion with round atelectasis, but Lung Carcinoma is possible. 3. No acute findings in the abdomen or pelvis. Left lower quadrant peritoneal dialysis catheter with small volume of free fluid in the abdomen and pelvis. 4. Extensive calcified atherosclerosis of the aorta and its branches, including the coronary arteries. 5. Chronic T9-T10 spinal stenosis. Electronically Signed   By: Genevie Ann M.D.   On: 12/02/2015 11:18   Dg Chest 2 View  12/02/2015  CLINICAL DATA:  59 year old female with cough. Abnormal right lower lobe. Initial encounter. EXAM: CHEST  2 VIEW COMPARISON:  Portable chest 10/10/2015. Chest CT 09/23/2015 and earlier. FINDINGS: Seated AP and lateral views. Unresolved rounded  masslike opacity in the right lower lobe (arrows). Superimposed right pleural effusion or pleural collection has not significantly changed. No superimposed pneumothorax or pulmonary edema. The left lung remain spared. Stable mediastinal contours. Visualized tracheal air column is within normal limits. No acute osseous abnormality identified. IMPRESSION: 1. Unresolved 6 cm mass-like opacity in the right lower lobe since March. Associated right pleural effusion or pleural collection has not significantly changed. Recommend further evaluation. 2. No new cardiopulmonary abnormality. Electronically Signed   By: Genevie Ann M.D.   On: 12/02/2015 09:11   Ct Chest Wo Contrast  12/02/2015  CLINICAL DATA:  60 year old female with acute onset coffee ground emesis. Cough. Peritoneal dialysis patient. Abnormal right lower lobe. Initial encounter. EXAM: CT CHEST, ABDOMEN AND PELVIS WITHOUT CONTRAST TECHNIQUE: Multidetector CT imaging of the chest, abdomen and pelvis was performed following the  standard protocol without IV contrast. COMPARISON:  Chest CT 09/23/2015.  CT Abdomen and Pelvis 11/14/2011. FINDINGS: CT CHEST New circumferential soft tissue thickening throughout the thoracic esophagus, up to 12 mm in thickness from just below the thoracic inlet to the gastroesophageal junction. No associated pneumomediastinum or pericardial effusion. No mediastinal lymphadenopathy. The abnormal esophageal thickening is inseparable at the level of the azygoesophageal recess from the continued moderate sized right pleural effusion or pleural collection. That collection appears unchanged since April, and as before there is thickening of at least the parietal layer of the pleura. Right lung ventilation also is unchanged since April with a 6.4 cm area of rounded right lower lobe opacity abutting the right pleural collection. There are minimal internal air bronchograms . There is no new or increased right lung opacity. The other major airways  are clear. The left lung is stable and clear. Extensive calcified aortic and coronary artery atherosclerosis. No thoracic lymphadenopathy. Stable visualized osseous structures. Bulky disc and/or endplate spurring in K7-Q25 lower thoracic level resulting in moderate to severe T9-T10 spinal stenosis which has been present since 2013. CT ABDOMEN AND PELVIS No acute osseous abnormality identified. Left lower quadrant approach peritoneal dialysis catheter. Small volume pelvic free fluid. Negative rectum aside from retained stool. Negative noncontrast uterus and adnexa. Mildly to moderately distended urinary bladder. Negative sigmoid colon aside from redundancy and retained stool. Retained stool also throughout the left colon, transverse colon and right colon. Negative appendix. No dilated small bowel. The gastroesophageal junction appears fairly stable since 2013. No abnormality of the stomach or duodenum identified. No free air in the abdomen. Small volume of abdominal free fluid in the bilateral upper and lower quadrants. Severe calcified atherosclerosis of the aorta and its branches. Severe calcified atherosclerosis of the iliac and proximal femoral arteries. Surgically absent gallbladder as before. Negative noncontrast liver, spleen, and adrenal glands. Chronic pancreatic atrophy appears stable. There is mild to moderate chronic renal atrophy which appears mildly progressed since 2013. Extensive renal artery calcified atherosclerosis. No lymphadenopathy. IMPRESSION: 1. Severe circumferential esophageal wall thickening is new since April 2017 and most suggestive of acute esophagitis. No other acute findings in the mediastinum, and no mediastinal adenopathy. 2. Unchanged 6.4 cm area of rounded confluent opacity in the right lower lobe abutting a chronic right pleural collection. This might reflect chronic fibrothorax or complex pleural effusion with round atelectasis, but Lung Carcinoma is possible. 3. No acute  findings in the abdomen or pelvis. Left lower quadrant peritoneal dialysis catheter with small volume of free fluid in the abdomen and pelvis. 4. Extensive calcified atherosclerosis of the aorta and its branches, including the coronary arteries. 5. Chronic T9-T10 spinal stenosis. Electronically Signed   By: Genevie Ann M.D.   On: 12/02/2015 11:18   I have personally reviewed and evaluated these images and lab results as part of my medical decision-making.   EKG Interpretation None      MDM   Final diagnoses:  None   Sherri Davidson is a 60 y.o. female here with coffee ground emesis. Concerned for possible upper GI bleed. Hx of gastric ulcers and currently on plavix. Occ negative by bedside. Will check labs, CT ab/pel since she has abdominal pain.   11:27 AM CT showed worsening esophagitis. No protonix available. Ordered IV pepcid. Hospitalist to admit for GI bleed from esophagitis. Pleural effusion evidence but CT showed likely chronic effusion likely from underlying cancer.      Wandra Arthurs, MD 12/02/15 [id number]

## 2015-12-02 NOTE — Progress Notes (Signed)
Connected to peritoneal dialysis cycler.

## 2015-12-02 NOTE — Progress Notes (Signed)
Pre peritoneal dialysis tx

## 2015-12-02 NOTE — ED Notes (Signed)
Pt receives peritoneal dialysis at home. Hookup for peritoneal dialysis is on patients L lower abdomen. Dressing clean, cry, and intact

## 2015-12-02 NOTE — ED Notes (Signed)
Patient's sister states patient was "twitching" like she does prior to having a seizure. Dr. Darl Householder aware.

## 2015-12-03 LAB — HEMOGLOBIN: Hemoglobin: 11.8 g/dL — ABNORMAL LOW (ref 12.0–16.0)

## 2015-12-03 LAB — HEPATITIS B SURFACE ANTIBODY, QUANTITATIVE

## 2015-12-03 LAB — BASIC METABOLIC PANEL
ANION GAP: 10 (ref 5–15)
BUN: 22 mg/dL — AB (ref 6–20)
CALCIUM: 8.8 mg/dL — AB (ref 8.9–10.3)
CO2: 23 mmol/L (ref 22–32)
Chloride: 107 mmol/L (ref 101–111)
Creatinine, Ser: 4.76 mg/dL — ABNORMAL HIGH (ref 0.44–1.00)
GFR calc Af Amer: 11 mL/min — ABNORMAL LOW (ref >60)
GFR, EST NON AFRICAN AMERICAN: 9 mL/min — AB (ref >60)
GLUCOSE: 134 mg/dL — AB (ref 65–99)
Potassium: 4 mmol/L (ref 3.5–5.1)
Sodium: 140 mmol/L (ref 135–145)

## 2015-12-03 LAB — CBC
HCT: 32.4 % — ABNORMAL LOW (ref 35.0–47.0)
Hemoglobin: 10.8 g/dL — ABNORMAL LOW (ref 12.0–16.0)
MCH: 28.8 pg (ref 26.0–34.0)
MCHC: 33.2 g/dL (ref 32.0–36.0)
MCV: 86.9 fL (ref 80.0–100.0)
PLATELETS: 279 10*3/uL (ref 150–440)
RBC: 3.73 MIL/uL — ABNORMAL LOW (ref 3.80–5.20)
RDW: 18.9 % — AB (ref 11.5–14.5)
WBC: 8.6 10*3/uL (ref 3.6–11.0)

## 2015-12-03 LAB — HEPATITIS B SURFACE ANTIGEN: HEP B S AG: NEGATIVE

## 2015-12-03 MED ORDER — PANTOPRAZOLE SODIUM 40 MG PO TBEC: 40.0000 mg | DELAYED_RELEASE_TABLET | Freq: Two times a day (BID) | ORAL | Status: DC

## 2015-12-03 MED ORDER — ONDANSETRON 4 MG PO TBDP: 4.0000 mg | ORAL_TABLET | Freq: Three times a day (TID) | ORAL | Status: DC | PRN

## 2015-12-03 MED ORDER — ONDANSETRON HCL 4 MG/2ML IJ SOLN: 4.0000 mg | Freq: Four times a day (QID) | INTRAMUSCULAR | Status: DC | PRN

## 2015-12-03 MED ORDER — ONDANSETRON HCL 4 MG/2ML IJ SOLN
4.0000 mg | INTRAMUSCULAR | Status: DC | PRN
Start: 2015-12-03 — End: 2015-12-03
  Administered 2015-12-03: 4 mg via INTRAVENOUS
  Filled 2015-12-03: qty 2

## 2015-12-03 NOTE — Discharge Instructions (Signed)
°  DIET:  Renal diet  DISCHARGE CONDITION:  Stable  ACTIVITY:  Activity as tolerated  OXYGEN:  Home Oxygen: No.   Oxygen Delivery: room air  DISCHARGE LOCATION:  home   If you experience worsening of your admission symptoms, develop shortness of breath, life threatening emergency, suicidal or homicidal thoughts you must seek medical attention immediately by calling 911 or calling your MD immediately  if symptoms less severe.  You Must read complete instructions/literature along with all the possible adverse reactions/side effects for all the Medicines you take and that have been prescribed to you. Take any new Medicines after you have completely understood and accpet all the possible adverse reactions/side effects.   Please note  You were cared for by a hospitalist during your hospital stay. If you have any questions about your discharge medications or the care you received while you were in the hospital after you are discharged, you can call the unit and asked to speak with the hospitalist on call if the hospitalist that took care of you is not available. Once you are discharged, your primary care physician will handle any further medical issues. Please note that NO REFILLS for any discharge medications will be authorized once you are discharged, as it is imperative that you return to your primary care physician (or establish a relationship with a primary care physician if you do not have one) for your aftercare needs so that they can reassess your need for medications and monitor your lab values.   Continue peritoneal dialysis as before.  Do not take plavix after discharge. You can restart Plavix on 12/10/2015 if there is no blood in stool.

## 2015-12-03 NOTE — Progress Notes (Signed)
Pt d/c to home today.  IV removed intact.  Rx's given to pt w/all questions and concerns addressed.  D/C paperwork reviewed and education provided with all questions and concerns addressed.  Pt sister at bedside for home transport.

## 2015-12-03 NOTE — Progress Notes (Signed)
[facility]  ROUNDING NOTE   Subjective:   Admitted with GI bleed. Was just as Sherri Davidson for similar presentation.  Peritoneal dialysis last night with negative 1 litre UF.  Dextrose 1.5%  Objective:  Vital signs in last 24 hours:  Temp:  [97.4 F (36.3 C)-97.8 F (36.6 C)] 97.8 F (36.6 C) (06/21 0552) Pulse Rate:  [84-94] 94 (06/21 0552) Resp:  [12-19] 19 (06/21 0552) BP: (106-171)/(66-108) 106/66 mmHg (06/21 0552) SpO2:  [99 %-100 %] 100 % (06/21 0552) Weight:  [58.333 kg (128 lb 9.6 oz)] 58.333 kg (128 lb 9.6 oz) (06/21 0500)  Weight change:  Filed Weights   12/02/15 0836 12/03/15 0500  Weight: 57.879 kg (127 lb 9.6 oz) 58.333 kg (128 lb 9.6 oz)    Intake/Output:     Intake/Output this shift:  Total I/O In: 1000 [Other:1000] Out: -   Physical Exam: General: NAD, laying in bed  Head: Normocephalic, atraumatic. Dry oral mucosal membranes  Eyes: Anicteric, PERRL  Neck: Supple, trachea midline  Lungs:  Clear to auscultation  Heart: Regular rate and rhythm  Abdomen:  Soft, nontender,   Extremities: no peripheral edema.  Neurologic: Nonfocal, moving all four extremities  Skin: No lesions  Access: PD catheter    Basic Metabolic Panel:  Recent Labs Lab 12/02/15 0903 12/03/15 0456  NA 138 140  K 3.8 4.0  CL 104 107  CO2 22 23  GLUCOSE 152* 134*  BUN 24* 22*  CREATININE 5.23* 4.76*  CALCIUM 8.9 8.8*    Liver Function Tests:  Recent Labs Lab 12/02/15 0903  AST 46*  ALT 34  ALKPHOS 171*  BILITOT 0.4  PROT 7.0  ALBUMIN 2.4*    Recent Labs Lab 12/02/15 0903  LIPASE 17   No results for input(s): AMMONIA in the last 168 hours.  CBC:  Recent Labs Lab 12/02/15 0903 12/02/15 1526 12/02/15 2059 12/03/15 0456  WBC 10.1  --   --  8.6  NEUTROABS 8.6*  --   --   --   HGB 12.3 11.3* 11.6* 10.8*  HCT 36.4  --   --  32.4*  MCV 87.0  --   --  86.9  PLT 299  --   --  279    Cardiac Enzymes: No results for input(s): CKTOTAL,  CKMB, CKMBINDEX, TROPONINI in the last 168 hours.  BNP: Invalid input(s): POCBNP  CBG: No results for input(s): GLUCAP in the last 168 hours.  Microbiology: Results for orders placed or performed during the hospital encounter of 11/08/15  MRSA PCR Screening     Status: Abnormal   Collection Time: 11/08/15  9:58 PM  Result Value Ref Range Status   MRSA by PCR POSITIVE (A) NEGATIVE Final    Comment:        The GeneXpert MRSA Assay (FDA approved for NASAL specimens only), is one component of a comprehensive MRSA colonization surveillance program. It is not intended to diagnose MRSA infection nor to guide or monitor treatment for MRSA infections. RESULT CALLED TO, READ BACK BY AND VERIFIED WITH: [name],[name] [phone] 11/09/15 MITCHELL,L   C difficile quick scan w PCR reflex     Status: None   Collection Time: 11/12/15 10:47 PM  Result Value Ref Range Status   C Diff antigen NEGATIVE NEGATIVE Final   C Diff toxin NEGATIVE NEGATIVE Final   C Diff interpretation Negative for toxigenic C. difficile  Final  Culture, blood (routine x 2)     Status: None  Collection Time: 11/15/15  2:47 PM  Result Value Ref Range Status   Specimen Description BLOOD RIGHT WRIST  Final   Special Requests IN PEDIATRIC BOTTLE 1.5 CC  Final   Culture NO GROWTH 5 DAYS  Final   Report Status 11/20/2015 FINAL  Final  Culture, blood (routine x 2)     Status: None   Collection Time: 11/15/15  2:51 PM  Result Value Ref Range Status   Specimen Description BLOOD RIGHT WRIST  Final   Special Requests IN PEDIATRIC BOTTLE 1.5CC  Final   Culture NO GROWTH 5 DAYS  Final   Report Status 11/20/2015 FINAL  Final    Coagulation Studies:  Recent Labs  12/02/15 0903  LABPROT 14.2  INR 1.08    Urinalysis: No results for input(s): COLORURINE, LABSPEC, PHURINE, GLUCOSEU, HGBUR, BILIRUBINUR, KETONESUR, PROTEINUR, UROBILINOGEN, NITRITE, LEUKOCYTESUR in the last 72 hours.  Invalid input(s): APPERANCEUR     Imaging: Ct Abdomen Pelvis Wo Contrast  12/02/2015  CLINICAL DATA:  60 year old female with acute onset coffee ground emesis. Cough. Peritoneal dialysis patient. Abnormal right lower lobe. Initial encounter. EXAM: CT CHEST, ABDOMEN AND PELVIS WITHOUT CONTRAST TECHNIQUE: Multidetector CT imaging of the chest, abdomen and pelvis was performed following the standard protocol without IV contrast. COMPARISON:  Chest CT 09/23/2015.  CT Abdomen and Pelvis 11/14/2011. FINDINGS: CT CHEST New circumferential soft tissue thickening throughout the thoracic esophagus, up to 12 mm in thickness from just below the thoracic inlet to the gastroesophageal junction. No associated pneumomediastinum or pericardial effusion. No mediastinal lymphadenopathy. The abnormal esophageal thickening is inseparable at the level of the azygoesophageal recess from the continued moderate sized right pleural effusion or pleural collection. That collection appears unchanged since April, and as before there is thickening of at least the parietal layer of the pleura. Right lung ventilation also is unchanged since April with a 6.4 cm area of rounded right lower lobe opacity abutting the right pleural collection. There are minimal internal air bronchograms . There is no new or increased right lung opacity. The other major airways are clear. The left lung is stable and clear. Extensive calcified aortic and coronary artery atherosclerosis. No thoracic lymphadenopathy. Stable visualized osseous structures. Bulky disc and/or endplate spurring in X9-B71 lower thoracic level resulting in moderate to severe T9-T10 spinal stenosis which has been present since 2013. CT ABDOMEN AND PELVIS No acute osseous abnormality identified. Left lower quadrant approach peritoneal dialysis catheter. Small volume pelvic free fluid. Negative rectum aside from retained stool. Negative noncontrast uterus and adnexa. Mildly to moderately distended urinary bladder. Negative  sigmoid colon aside from redundancy and retained stool. Retained stool also throughout the left colon, transverse colon and right colon. Negative appendix. No dilated small bowel. The gastroesophageal junction appears fairly stable since 2013. No abnormality of the stomach or duodenum identified. No free air in the abdomen. Small volume of abdominal free fluid in the bilateral upper and lower quadrants. Severe calcified atherosclerosis of the aorta and its branches. Severe calcified atherosclerosis of the iliac and proximal femoral arteries. Surgically absent gallbladder as before. Negative noncontrast liver, spleen, and adrenal glands. Chronic pancreatic atrophy appears stable. There is mild to moderate chronic renal atrophy which appears mildly progressed since 2013. Extensive renal artery calcified atherosclerosis. No lymphadenopathy. IMPRESSION: 1. Severe circumferential esophageal wall thickening is new since April 2017 and most suggestive of acute esophagitis. No other acute findings in the mediastinum, and no mediastinal adenopathy. 2. Unchanged 6.4 cm area of rounded confluent opacity in the right  lower lobe abutting a chronic right pleural collection. This might reflect chronic fibrothorax or complex pleural effusion with round atelectasis, but Lung Carcinoma is possible. 3. No acute findings in the abdomen or pelvis. Left lower quadrant peritoneal dialysis catheter with small volume of free fluid in the abdomen and pelvis. 4. Extensive calcified atherosclerosis of the aorta and its branches, including the coronary arteries. 5. Chronic T9-T10 spinal stenosis. Electronically Signed   By: Genevie Ann M.D.   On: 12/02/2015 11:18   Dg Chest 2 View  12/02/2015  CLINICAL DATA:  60 year old female with cough. Abnormal right lower lobe. Initial encounter. EXAM: CHEST  2 VIEW COMPARISON:  Portable chest 10/10/2015. Chest CT 09/23/2015 and earlier. FINDINGS: Seated AP and lateral views. Unresolved rounded masslike  opacity in the right lower lobe (arrows). Superimposed right pleural effusion or pleural collection has not significantly changed. No superimposed pneumothorax or pulmonary edema. The left lung remain spared. Stable mediastinal contours. Visualized tracheal air column is within normal limits. No acute osseous abnormality identified. IMPRESSION: 1. Unresolved 6 cm mass-like opacity in the right lower lobe since March. Associated right pleural effusion or pleural collection has not significantly changed. Recommend further evaluation. 2. No new cardiopulmonary abnormality. Electronically Signed   By: Genevie Ann M.D.   On: 12/02/2015 09:11   Ct Chest Wo Contrast  12/02/2015  CLINICAL DATA:  60 year old female with acute onset coffee ground emesis. Cough. Peritoneal dialysis patient. Abnormal right lower lobe. Initial encounter. EXAM: CT CHEST, ABDOMEN AND PELVIS WITHOUT CONTRAST TECHNIQUE: Multidetector CT imaging of the chest, abdomen and pelvis was performed following the standard protocol without IV contrast. COMPARISON:  Chest CT 09/23/2015.  CT Abdomen and Pelvis 11/14/2011. FINDINGS: CT CHEST New circumferential soft tissue thickening throughout the thoracic esophagus, up to 12 mm in thickness from just below the thoracic inlet to the gastroesophageal junction. No associated pneumomediastinum or pericardial effusion. No mediastinal lymphadenopathy. The abnormal esophageal thickening is inseparable at the level of the azygoesophageal recess from the continued moderate sized right pleural effusion or pleural collection. That collection appears unchanged since April, and as before there is thickening of at least the parietal layer of the pleura. Right lung ventilation also is unchanged since April with a 6.4 cm area of rounded right lower lobe opacity abutting the right pleural collection. There are minimal internal air bronchograms . There is no new or increased right lung opacity. The other major airways are clear.  The left lung is stable and clear. Extensive calcified aortic and coronary artery atherosclerosis. No thoracic lymphadenopathy. Stable visualized osseous structures. Bulky disc and/or endplate spurring in L2-G40 lower thoracic level resulting in moderate to severe T9-T10 spinal stenosis which has been present since 2013. CT ABDOMEN AND PELVIS No acute osseous abnormality identified. Left lower quadrant approach peritoneal dialysis catheter. Small volume pelvic free fluid. Negative rectum aside from retained stool. Negative noncontrast uterus and adnexa. Mildly to moderately distended urinary bladder. Negative sigmoid colon aside from redundancy and retained stool. Retained stool also throughout the left colon, transverse colon and right colon. Negative appendix. No dilated small bowel. The gastroesophageal junction appears fairly stable since 2013. No abnormality of the stomach or duodenum identified. No free air in the abdomen. Small volume of abdominal free fluid in the bilateral upper and lower quadrants. Severe calcified atherosclerosis of the aorta and its branches. Severe calcified atherosclerosis of the iliac and proximal femoral arteries. Surgically absent gallbladder as before. Negative noncontrast liver, spleen, and adrenal glands. Chronic pancreatic atrophy appears  stable. There is mild to moderate chronic renal atrophy which appears mildly progressed since 2013. Extensive renal artery calcified atherosclerosis. No lymphadenopathy. IMPRESSION: 1. Severe circumferential esophageal wall thickening is new since April 2017 and most suggestive of acute esophagitis. No other acute findings in the mediastinum, and no mediastinal adenopathy. 2. Unchanged 6.4 cm area of rounded confluent opacity in the right lower lobe abutting a chronic right pleural collection. This might reflect chronic fibrothorax or complex pleural effusion with round atelectasis, but Lung Carcinoma is possible. 3. No acute findings in the  abdomen or pelvis. Left lower quadrant peritoneal dialysis catheter with small volume of free fluid in the abdomen and pelvis. 4. Extensive calcified atherosclerosis of the aorta and its branches, including the coronary arteries. 5. Chronic T9-T10 spinal stenosis. Electronically Signed   By: Genevie Ann M.D.   On: 12/02/2015 11:18     Medications:     . antiseptic oral rinse  7 mL Mouth Rinse BID  . dialysis solution 1.5% low-MG/low-CA   Intraperitoneal Q24H  . gentamicin cream  1 application Topical Daily  . levETIRAcetam  500 mg Oral BID  . levothyroxine  50 mcg Oral QAC breakfast  . midodrine  5 mg Oral TID WC  . multivitamin  1 tablet Oral QHS  . pantoprazole sodium  40 mg Oral BID  . phenytoin  100 mg Oral QHS  . potassium chloride SA  20 mEq Oral Daily  . simvastatin  20 mg Oral QHS   loperamide, ondansetron (ZOFRAN) IV, oxyCODONE-acetaminophen  Assessment/ Plan:  Sherri Davidson is a 60 y.o. white female  With CVA, Hypotension, diabetes mellitus type II, congestive heart failure, seizure disorder, hypothyroidism, DVT, presents 12/02/2015 for Esophagitis [K20.9]   [name] Peritoneal dialysis  Prescription: CCPD 8 hours with 6 exchanges 2 litre fills  1. ESRD: on peritoneal dialysis. Negative UF last night with 1.5%  - peritoneal dialysis tonight - Apparently with allergy to gentamicin cream, will see about providing ointment.  2. GI Bleed with anemia of chronic kidney disease: hemoglobin at goal - epo as outpatient - Appreciate GI input.   3. Hypotension: elevated on admission - midodrine ordered.   4. Secondary Hyperparathyroidism:  Outpatient PTH at goal. Hyperphosphatemia. - Does not currently take a binder as an outpatient.   LOS:  Lavonia Dana 6/21/201710:43 AM

## 2015-12-03 NOTE — Care Management (Signed)
Followed by [facility].  Notified Keatah at [facility] of pending discharge.  Patient lives at home with her sister Cleta Alberts, who will be picking her up at discharge. [place]ta Alber[place] be at the beach starting 12/04/15.  Per Lauralyn Primes they have arranged for patient go to [facility] for respite care while family is out of town.  CSW followed up with [facility] and facility stated that they were unaware that patient would need PD and this would not be a need they would be able to meet. After speaking with Keatah. [facility] states they have made arrangements have been for PD to be performed at [facility]. RNCM signing off

## 2015-12-03 NOTE — Progress Notes (Signed)
Disconnected patient from peritoneal dialysis cycler.

## 2015-12-05 NOTE — Discharge Summary (Signed)
[facility] NAME: Sherri Davidson    MR#:  [id number]  DATE OF BIRTH:  12-28-1955  DATE OF ADMISSION:  12/02/2015 ADMITTING PHYSICIAN: Vaughan Basta, MD  DATE OF DISCHARGE: 12/03/2015  6:02 PM  PRIMARY CARE PHYSICIAN: [facility]   ADMISSION DIAGNOSIS:  Esophagitis [K20.9]  DISCHARGE DIAGNOSIS:  Active Problems:   Hematemesis   SECONDARY DIAGNOSIS:   Past Medical History  Diagnosis Date  . CVA, old, hemiparesis ([place])   . Hypertension   . Diabetes ([place])   . ESRD on peritoneal dialysis ([place])   . CHF (congestive heart failure) ([place])   . Hypothyroidism   . Seizures ([place])   . Stroke ([place])   . Cancer ([place])     lung cancer  . Blood clot in vein right hand     ADMITTING HISTORY  HISTORY OF PRESENT ILLNESS: Sherri Davidson is a 60 y.o. female with a known history of Cerebrovascular accident with hemiparesis, hypertension, diabetes, end-stage renal disease on peritoneal dialysis, congestive heart failure, hypothyroidism, seizure disorder, lung cancer, superficial venous clot in the right upper extremity recently, hematemesis in the recent admission 3 weeks ago without any further GI workup because of stable hemoglobin. Sent from her group home with hematemesis. Patient is not oriented so she is not able to give me any further details, this history obtained from ER physician and her past records.  HOSPITAL COURSE:   * Hematemesis? Patient did have brown vomiting but it is not clear if this was blood or food products.  Hemoglobin is currently stable, CT scan of the abdomen show severe esophagitis  Started on IV Protonix in the hospital. Hemoglobin remained stable. Patient is being changed to oral Protonix twice a day at discharge. No further bleeding. Total was negative for blood.  * Seizures disorder  Continue baseline seizure medications.  * Stroke in the past We will hold patient's  Plavix for 1 week after discharge.  * Hypothyroidism  Continue levothyroxine, check TSH.  * Hyperlipidemia  Continue statin.  Discussed with patient and her sister prior to discharge. All questions answered. Patient will be discharged home with home health services.  CONSULTS OBTAINED:  Treatment Team:  Lavonia Dana, MD  DRUG ALLERGIES:   Allergies  Allergen Reactions  . Adhesive [Tape] Hives    Please use paper tape  . Salicylates Hives and Nausea Only    Uncoated.  Diona Fanti [Aspirin] Other (See Comments)    GI upset for non-enteric coated aspirin    DISCHARGE MEDICATIONS:   Discharge Medication List as of 12/03/2015  4:59 PM    START taking these medications   Details  ondansetron (ZOFRAN ODT) 4 MG disintegrating tablet Take 1 tablet (4 mg total) by mouth every 8 (eight) hours as needed for nausea or vomiting., Starting 12/03/2015, Until Discontinued, Normal      CONTINUE these medications which have CHANGED   Details  pantoprazole (PROTONIX) 40 MG tablet Take 1 tablet (40 mg total) by mouth 2 (two) times daily before a meal., Starting 12/03/2015, Until Discontinued, Normal      CONTINUE these medications which have NOT CHANGED   Details  aspirin EC 81 MG tablet Take 1 tablet (81 mg total) by mouth daily. For heart protection, Starting 11/28/2015, Until Discontinued, No Print    clopidogrel (PLAVIX) 75 MG tablet Take 1 tablet (75 mg total) by mouth daily. For heart protection, Starting 11/28/2015, Until Discontinued, No Print  levETIRAcetam (KEPPRA) 500 MG tablet Take 1 tablet (500 mg total) by mouth 2 (two) times daily., Starting 05/28/2015, Until Discontinued, Print    levothyroxine (SYNTHROID, LEVOTHROID) 50 MCG tablet Take 50 mcg by mouth daily. For hypothyroidism, Until Discontinued, Historical Med    loperamide (IMODIUM) 2 MG capsule Take 2-4 mg by mouth as needed for diarrhea or loose stools., Until Discontinued, Historical Med    Maltodextrin-Xanthan Gum  (RESOURCE THICKENUP CLEAR) POWD Use as per product instructions to thicken liquids to nectar thick consistency., Print    midodrine (PROAMATINE) 5 MG tablet Take 1 tablet (5 mg total) by mouth 3 (three) times daily with meals., Starting 11/21/2015, Until Discontinued, Print    multivitamin (RENA-VIT) TABS tablet Take 1 tablet by mouth at bedtime., Starting 11/21/2015, Until Discontinued, Print    oxyCODONE-acetaminophen (PERCOCET/ROXICET) 5-325 MG tablet Take 1 tablet by mouth daily as needed for moderate pain or severe pain., Until Discontinued, Historical Med    phenytoin (DILANTIN) 100 MG ER capsule Take 1 capsule (100 mg total) by mouth at bedtime., Starting 11/21/2015, Until Discontinued, Print    potassium chloride SA (K-DUR,KLOR-CON) 20 MEQ tablet Take 20 mEq by mouth daily. , Until Discontinued, Historical Med    ranitidine (ZANTAC) 150 MG tablet Take 150 mg by mouth daily as needed for heartburn., Until Discontinued, Historical Med    simvastatin (ZOCOR) 20 MG tablet Take 20 mg by mouth at bedtime. , Until Discontinued, Historical Med    Vitamin D, Ergocalciferol, (DRISDOL) 50000 units CAPS capsule Take 50,000 Units by mouth every 30 (thirty) days. On or about the 1st of each month, Until Discontinued, Historical Med    Zinc Oxide 13 % CREA Apply 1 application topically daily as needed (for barrier cream on buttocks while toileting)., Until Discontinued, Historical Med        Today   VITAL SIGNS:  Blood pressure 106/66, pulse 94, temperature 97.8 F (36.6 C), temperature source Oral, resp. rate 19, height '5\' 6"'$  (1.676 m), weight 58.333 kg (128 lb 9.6 oz), SpO2 100 %.  I/O:  No intake or output data in the 24 hours ending 12/05/15 1339  PHYSICAL EXAMINATION:  Physical Exam  GENERAL:  60 y.o.-year-old patient lying in the bed with no acute distress.  LUNGS: Normal breath sounds bilaterally, no wheezing, rales,rhonchi or crepitation. No use of accessory muscles of respiration.   CARDIOVASCULAR: S1, S2 normal. No murmurs, rubs, or gallops.  ABDOMEN: Soft, non-tender, non-distended. Bowel sounds present. No organomegaly or mass.  NEUROLOGIC: Moves all 4 extremities. PSYCHIATRIC: The patient is alert and Awake SKIN: No obvious rash, lesion, or ulcer.  Peritoneal dialysis catheter in place  DATA REVIEW:   CBC  Recent Labs Lab 12/03/15 0456 12/03/15 1309  WBC 8.6  --   HGB 10.8* 11.8*  HCT 32.4*  --   PLT 279  --     Chemistries   Recent Labs Lab 12/02/15 0903 12/03/15 0456  NA 138 140  K 3.8 4.0  CL 104 107  CO2 22 23  GLUCOSE 152* 134*  BUN 24* 22*  CREATININE 5.23* 4.76*  CALCIUM 8.9 8.8*  AST 46*  --   ALT 34  --   ALKPHOS 171*  --   BILITOT 0.4  --     Cardiac Enzymes No results for input(s): TROPONINI in the last 168 hours.  Microbiology Results  Results for orders placed or performed during the hospital encounter of 11/08/15  MRSA PCR Screening     Status: Abnormal  Collection Time: 11/08/15  9:58 PM  Result Value Ref Range Status   MRSA by PCR POSITIVE (A) NEGATIVE Final    Comment:        The GeneXpert MRSA Assay (FDA approved for NASAL specimens only), is one component of a comprehensive MRSA colonization surveillance program. It is not intended to diagnose MRSA infection nor to guide or monitor treatment for MRSA infections. RESULT CALLED TO, READ BACK BY AND VERIFIED WITH: [name],[name] [phone] 11/09/15 MITCHELL,L   C difficile quick scan w PCR reflex     Status: None   Collection Time: 11/12/15 10:47 PM  Result Value Ref Range Status   C Diff antigen NEGATIVE NEGATIVE Final   C Diff toxin NEGATIVE NEGATIVE Final   C Diff interpretation Negative for toxigenic C. difficile  Final  Culture, blood (routine x 2)     Status: None   Collection Time: 11/15/15  2:47 PM  Result Value Ref Range Status   Specimen Description BLOOD RIGHT WRIST  Final   Special Requests IN PEDIATRIC BOTTLE 1.5 CC  Final   Culture NO GROWTH 5  DAYS  Final   Report Status 11/20/2015 FINAL  Final  Culture, blood (routine x 2)     Status: None   Collection Time: 11/15/15  2:51 PM  Result Value Ref Range Status   Specimen Description BLOOD RIGHT WRIST  Final   Special Requests IN PEDIATRIC BOTTLE 1.5CC  Final   Culture NO GROWTH 5 DAYS  Final   Report Status 11/20/2015 FINAL  Final    RADIOLOGY:  No results found.  Follow up with PCP in 1 week.  Management plans discussed with the patient, family and they are in agreement.  CODE STATUS:  Code Status History    Date Active Date Inactive Code Status Order ID Comments User Context   12/02/2015  3:15 PM 12/03/2015  9:02 PM Full Code [id number]  Vaughan Basta, MD Inpatient   11/08/2015 10:33 PM 11/21/2015  5:53 PM Full Code [id number]  Norval Morton, MD Inpatient   08/17/2015  8:07 AM 08/18/2015  6:39 PM Full Code [id number]  Epifanio Lesches, MD ED   05/24/2015  3:42 AM 05/28/2015  5:34 PM Full Code [id number]  Saundra Shelling, MD Inpatient    Advance Directive Documentation        Most Recent Value   Type of Advance Directive  Healthcare Power of Attorney   Pre-existing out of facility DNR order (yellow form or pink MOST form)     "MOST" Form in Place?        TOTAL TIME TAKING CARE OF THIS PATIENT ON DAY OF DISCHARGE: more than 30 minutes.   Hillary Bow R M.D on 12/05/2015 at 1:39 PM  Between 7am to 6pm - Pager - [phone]  After 6pm go to [email or web address] - password [email or web address]  Office  [phone]  CC: Primary care physician; [facility]  Note: This dictation was prepared with Dragon dictation along with smaller phrase technology. Any transcriptional errors that result from this process are unintentional.

## 2015-12-30 ENCOUNTER — Other Ambulatory Visit: Payer: Self-pay | Admitting: Gastroenterology

## 2015-12-30 DIAGNOSIS — K920 Hematemesis: Secondary | ICD-10-CM

## 2015-12-30 DIAGNOSIS — R1310 Dysphagia, unspecified: Secondary | ICD-10-CM

## 2016-01-06 ENCOUNTER — Ambulatory Visit
Admission: RE | Admit: 2016-01-06 | Discharge: 2016-01-06 | Disposition: A | Discharge status: Home / Self Care | Payer: Medicare (Managed Care) | Source: Ambulatory Visit | Attending: Gastroenterology | Admitting: Gastroenterology

## 2016-01-06 DIAGNOSIS — K920 Hematemesis: Secondary | ICD-10-CM | POA: Insufficient documentation

## 2016-01-06 DIAGNOSIS — R4702 Dysphasia: Secondary | ICD-10-CM | POA: Insufficient documentation

## 2016-01-06 DIAGNOSIS — R1310 Dysphagia, unspecified: Secondary | ICD-10-CM

## 2016-01-18 ENCOUNTER — Observation Stay
Admission: EM | Admit: 2016-01-18 | Discharge: 2016-01-19 | Disposition: A | Discharge status: Home / Self Care | Payer: Medicare (Managed Care) | Source: Home / Self Care | Attending: Emergency Medicine | Admitting: Emergency Medicine

## 2016-01-18 ENCOUNTER — Emergency Department: Payer: Medicare (Managed Care)

## 2016-01-18 ENCOUNTER — Observation Stay
Admit: 2016-01-18 | Discharge: 2016-01-18 | Disposition: A | Discharge status: Home / Self Care | Payer: Medicare (Managed Care) | Attending: Internal Medicine | Admitting: Internal Medicine

## 2016-01-18 ENCOUNTER — Encounter: Payer: Self-pay | Admitting: Emergency Medicine

## 2016-01-18 DIAGNOSIS — G40909 Epilepsy, unspecified, not intractable, without status epilepticus: Secondary | ICD-10-CM | POA: Insufficient documentation

## 2016-01-18 DIAGNOSIS — Z886 Allergy status to analgesic agent status: Secondary | ICD-10-CM

## 2016-01-18 DIAGNOSIS — J869 Pyothorax without fistula: Secondary | ICD-10-CM | POA: Diagnosis present

## 2016-01-18 DIAGNOSIS — K209 Esophagitis, unspecified: Secondary | ICD-10-CM | POA: Insufficient documentation

## 2016-01-18 DIAGNOSIS — Z7982 Long term (current) use of aspirin: Secondary | ICD-10-CM

## 2016-01-18 DIAGNOSIS — N186 End stage renal disease: Secondary | ICD-10-CM | POA: Insufficient documentation

## 2016-01-18 DIAGNOSIS — Z85118 Personal history of other malignant neoplasm of bronchus and lung: Secondary | ICD-10-CM

## 2016-01-18 DIAGNOSIS — I4891 Unspecified atrial fibrillation: Secondary | ICD-10-CM

## 2016-01-18 DIAGNOSIS — Z7902 Long term (current) use of antithrombotics/antiplatelets: Secondary | ICD-10-CM

## 2016-01-18 DIAGNOSIS — N2581 Secondary hyperparathyroidism of renal origin: Secondary | ICD-10-CM

## 2016-01-18 DIAGNOSIS — Z823 Family history of stroke: Secondary | ICD-10-CM

## 2016-01-18 DIAGNOSIS — E039 Hypothyroidism, unspecified: Secondary | ICD-10-CM | POA: Diagnosis present

## 2016-01-18 DIAGNOSIS — R7989 Other specified abnormal findings of blood chemistry: Secondary | ICD-10-CM | POA: Diagnosis present

## 2016-01-18 DIAGNOSIS — E871 Hypo-osmolality and hyponatremia: Secondary | ICD-10-CM | POA: Insufficient documentation

## 2016-01-18 DIAGNOSIS — Z992 Dependence on renal dialysis: Secondary | ICD-10-CM

## 2016-01-18 DIAGNOSIS — R778 Other specified abnormalities of plasma proteins: Secondary | ICD-10-CM

## 2016-01-18 DIAGNOSIS — E1122 Type 2 diabetes mellitus with diabetic chronic kidney disease: Secondary | ICD-10-CM | POA: Diagnosis present

## 2016-01-18 DIAGNOSIS — Z8673 Personal history of transient ischemic attack (TIA), and cerebral infarction without residual deficits: Secondary | ICD-10-CM

## 2016-01-18 DIAGNOSIS — J440 Chronic obstructive pulmonary disease with acute lower respiratory infection: Principal | ICD-10-CM | POA: Diagnosis present

## 2016-01-18 DIAGNOSIS — E875 Hyperkalemia: Secondary | ICD-10-CM | POA: Insufficient documentation

## 2016-01-18 DIAGNOSIS — I69359 Hemiplegia and hemiparesis following cerebral infarction affecting unspecified side: Secondary | ICD-10-CM

## 2016-01-18 DIAGNOSIS — K219 Gastro-esophageal reflux disease without esophagitis: Secondary | ICD-10-CM | POA: Diagnosis present

## 2016-01-18 DIAGNOSIS — J9621 Acute and chronic respiratory failure with hypoxia: Secondary | ICD-10-CM | POA: Diagnosis present

## 2016-01-18 DIAGNOSIS — Z888 Allergy status to other drugs, medicaments and biological substances status: Secondary | ICD-10-CM

## 2016-01-18 DIAGNOSIS — R110 Nausea: Secondary | ICD-10-CM

## 2016-01-18 DIAGNOSIS — Z9109 Other allergy status, other than to drugs and biological substances: Secondary | ICD-10-CM

## 2016-01-18 DIAGNOSIS — Z8249 Family history of ischemic heart disease and other diseases of the circulatory system: Secondary | ICD-10-CM

## 2016-01-18 DIAGNOSIS — Z87892 Personal history of anaphylaxis: Secondary | ICD-10-CM

## 2016-01-18 DIAGNOSIS — R9431 Abnormal electrocardiogram [ECG] [EKG]: Secondary | ICD-10-CM

## 2016-01-18 DIAGNOSIS — R748 Abnormal levels of other serum enzymes: Secondary | ICD-10-CM | POA: Insufficient documentation

## 2016-01-18 DIAGNOSIS — F17210 Nicotine dependence, cigarettes, uncomplicated: Secondary | ICD-10-CM

## 2016-01-18 DIAGNOSIS — D631 Anemia in chronic kidney disease: Secondary | ICD-10-CM

## 2016-01-18 DIAGNOSIS — Z91048 Other nonmedicinal substance allergy status: Secondary | ICD-10-CM

## 2016-01-18 DIAGNOSIS — Z9049 Acquired absence of other specified parts of digestive tract: Secondary | ICD-10-CM

## 2016-01-18 DIAGNOSIS — I132 Hypertensive heart and chronic kidney disease with heart failure and with stage 5 chronic kidney disease, or end stage renal disease: Secondary | ICD-10-CM | POA: Insufficient documentation

## 2016-01-18 DIAGNOSIS — I959 Hypotension, unspecified: Secondary | ICD-10-CM | POA: Diagnosis present

## 2016-01-18 DIAGNOSIS — I5033 Acute on chronic diastolic (congestive) heart failure: Secondary | ICD-10-CM | POA: Insufficient documentation

## 2016-01-18 DIAGNOSIS — Y95 Nosocomial condition: Secondary | ICD-10-CM | POA: Diagnosis present

## 2016-01-18 DIAGNOSIS — J9811 Atelectasis: Secondary | ICD-10-CM | POA: Diagnosis present

## 2016-01-18 DIAGNOSIS — Z79899 Other long term (current) drug therapy: Secondary | ICD-10-CM

## 2016-01-18 DIAGNOSIS — I5022 Chronic systolic (congestive) heart failure: Secondary | ICD-10-CM | POA: Diagnosis present

## 2016-01-18 DIAGNOSIS — C3431 Malignant neoplasm of lower lobe, right bronchus or lung: Secondary | ICD-10-CM | POA: Diagnosis present

## 2016-01-18 DIAGNOSIS — J189 Pneumonia, unspecified organism: Secondary | ICD-10-CM | POA: Diagnosis present

## 2016-01-18 DIAGNOSIS — R1310 Dysphagia, unspecified: Secondary | ICD-10-CM

## 2016-01-18 DIAGNOSIS — E785 Hyperlipidemia, unspecified: Secondary | ICD-10-CM | POA: Diagnosis present

## 2016-01-18 DIAGNOSIS — E1142 Type 2 diabetes mellitus with diabetic polyneuropathy: Secondary | ICD-10-CM | POA: Insufficient documentation

## 2016-01-18 DIAGNOSIS — Z9119 Patient's noncompliance with other medical treatment and regimen: Secondary | ICD-10-CM

## 2016-01-18 DIAGNOSIS — J441 Chronic obstructive pulmonary disease with (acute) exacerbation: Secondary | ICD-10-CM | POA: Diagnosis present

## 2016-01-18 LAB — BASIC METABOLIC PANEL
Anion gap: 11 (ref 5–15)
BUN: 37 mg/dL — AB (ref 6–20)
CO2: 16 mmol/L — ABNORMAL LOW (ref 22–32)
CREATININE: 6.32 mg/dL — AB (ref 0.44–1.00)
Calcium: 8.4 mg/dL — ABNORMAL LOW (ref 8.9–10.3)
Chloride: 107 mmol/L (ref 101–111)
GFR calc Af Amer: 8 mL/min — ABNORMAL LOW (ref >60)
GFR, EST NON AFRICAN AMERICAN: 7 mL/min — AB (ref >60)
GLUCOSE: 117 mg/dL — AB (ref 65–99)
POTASSIUM: 5.5 mmol/L — AB (ref 3.5–5.1)
SODIUM: 134 mmol/L — AB (ref 135–145)

## 2016-01-18 LAB — CBC
HEMATOCRIT: 36 % (ref 35.0–47.0)
HEMATOCRIT: 33.8 % — AB (ref 35.0–47.0)
HEMOGLOBIN: 12 g/dL (ref 12.0–16.0)
Hemoglobin: 11.2 g/dL — ABNORMAL LOW (ref 12.0–16.0)
MCH: 29 pg (ref 26.0–34.0)
MCH: 28.9 pg (ref 26.0–34.0)
MCHC: 33.2 g/dL (ref 32.0–36.0)
MCHC: 33.1 g/dL (ref 32.0–36.0)
MCV: 87.3 fL (ref 80.0–100.0)
MCV: 87.1 fL (ref 80.0–100.0)
PLATELETS: 431 10*3/uL (ref 150–440)
Platelets: 417 10*3/uL (ref 150–440)
RBC: 4.12 MIL/uL (ref 3.80–5.20)
RBC: 3.88 MIL/uL (ref 3.80–5.20)
RDW: 18.5 % — ABNORMAL HIGH (ref 11.5–14.5)
RDW: 18.3 % — AB (ref 11.5–14.5)
WBC: 15.5 10*3/uL — ABNORMAL HIGH (ref 3.6–11.0)
WBC: 14.6 10*3/uL — AB (ref 3.6–11.0)

## 2016-01-18 LAB — COMPREHENSIVE METABOLIC PANEL
ALBUMIN: 2.1 g/dL — AB (ref 3.5–5.0)
ALK PHOS: 166 U/L — AB (ref 38–126)
ALT: 15 U/L (ref 14–54)
AST: 31 U/L (ref 15–41)
Anion gap: 11 (ref 5–15)
BILIRUBIN TOTAL: 0.2 mg/dL — AB (ref 0.3–1.2)
BUN: 36 mg/dL — AB (ref 6–20)
CALCIUM: 8.4 mg/dL — AB (ref 8.9–10.3)
CO2: 18 mmol/L — AB (ref 22–32)
Chloride: 105 mmol/L (ref 101–111)
Creatinine, Ser: 5.95 mg/dL — ABNORMAL HIGH (ref 0.44–1.00)
GFR calc Af Amer: 8 mL/min — ABNORMAL LOW (ref >60)
GFR calc non Af Amer: 7 mL/min — ABNORMAL LOW (ref >60)
GLUCOSE: 145 mg/dL — AB (ref 65–99)
Potassium: 4.6 mmol/L (ref 3.5–5.1)
Sodium: 134 mmol/L — ABNORMAL LOW (ref 135–145)
TOTAL PROTEIN: 6.9 g/dL (ref 6.5–8.1)

## 2016-01-18 LAB — GLUCOSE, CAPILLARY: Glucose-Capillary: 208 mg/dL — ABNORMAL HIGH (ref 65–99)

## 2016-01-18 LAB — MRSA PCR SCREENING: MRSA BY PCR: POSITIVE — AB

## 2016-01-18 LAB — TROPONIN I
TROPONIN I: 0.18 ng/mL — AB (ref <0.03)
Troponin I: 0.09 ng/mL (ref <0.03)
Troponin I: 0.23 ng/mL (ref <0.03)
Troponin I: 0.18 ng/mL (ref <0.03)

## 2016-01-18 MED ORDER — CLOPIDOGREL BISULFATE 75 MG PO TABS
75.0000 mg | ORAL_TABLET | Freq: Every day | ORAL | Status: DC
  Administered 2016-01-18 – 2016-01-19 (×2): 75 mg via ORAL
  Filled 2016-01-18 (×2): qty 1

## 2016-01-18 MED ORDER — SODIUM CHLORIDE 0.9 % IV SOLN
250.0000 mL | INTRAVENOUS | Status: DC | PRN
  Administered 2016-01-18: 250 mL via INTRAVENOUS

## 2016-01-18 MED ORDER — DEXTROSE 5 % IV SOLN
500.0000 mg | INTRAVENOUS | Status: DC
  Administered 2016-01-18 – 2016-01-19 (×2): 500 mg via INTRAVENOUS
  Filled 2016-01-18 (×2): qty 500

## 2016-01-18 MED ORDER — ACETAMINOPHEN 325 MG PO TABS
650.0000 mg | ORAL_TABLET | Freq: Four times a day (QID) | ORAL | Status: DC | PRN
Start: 2016-01-18 — End: 2016-01-19

## 2016-01-18 MED ORDER — RENA-VITE PO TABS
1.0000 | ORAL_TABLET | Freq: Every day | ORAL | Status: DC
  Administered 2016-01-18: 1 via ORAL
  Filled 2016-01-18: qty 1

## 2016-01-18 MED ORDER — DELFLEX-LC/1.5% DEXTROSE 346 MOSM/L IP SOLN
INTRAPERITONEAL | Status: DC
  Administered 2016-01-18: 10 L via INTRAPERITONEAL
  Filled 2016-01-18: qty 3000

## 2016-01-18 MED ORDER — VITAMIN D (ERGOCALCIFEROL) 1.25 MG (50000 UNIT) PO CAPS: 50000.0000 [IU] | ORAL_CAPSULE | ORAL | Status: DC

## 2016-01-18 MED ORDER — SIMVASTATIN 20 MG PO TABS
20.0000 mg | ORAL_TABLET | Freq: Every day | ORAL | Status: DC
  Administered 2016-01-18: 20 mg via ORAL
  Filled 2016-01-18: qty 1

## 2016-01-18 MED ORDER — ASPIRIN EC 81 MG PO TBEC
81.0000 mg | DELAYED_RELEASE_TABLET | Freq: Every day | ORAL | Status: DC
  Administered 2016-01-18 – 2016-01-19 (×2): 81 mg via ORAL
  Filled 2016-01-18 (×2): qty 1

## 2016-01-18 MED ORDER — SENNOSIDES-DOCUSATE SODIUM 8.6-50 MG PO TABS: 1.0000 | ORAL_TABLET | Freq: Every evening | ORAL | Status: DC | PRN

## 2016-01-18 MED ORDER — ACETAMINOPHEN 650 MG RE SUPP: 650.0000 mg | Freq: Four times a day (QID) | RECTAL | Status: DC | PRN

## 2016-01-18 MED ORDER — OXYCODONE-ACETAMINOPHEN 5-325 MG PO TABS: 1.0000 | ORAL_TABLET | Freq: Every day | ORAL | Status: DC | PRN

## 2016-01-18 MED ORDER — DEXTROSE 5 % IV SOLN
1.0000 g | INTRAVENOUS | Status: DC
  Administered 2016-01-18 – 2016-01-19 (×2): 1 g via INTRAVENOUS
  Filled 2016-01-18 (×3): qty 10

## 2016-01-18 MED ORDER — HEPARIN SODIUM (PORCINE) 5000 UNIT/ML IJ SOLN
5000.0000 [IU] | Freq: Three times a day (TID) | INTRAMUSCULAR | Status: DC
  Administered 2016-01-18 – 2016-01-19 (×4): 5000 [IU] via SUBCUTANEOUS
  Filled 2016-01-18 (×4): qty 1

## 2016-01-18 MED ORDER — MIDODRINE HCL 5 MG PO TABS
5.0000 mg | ORAL_TABLET | Freq: Three times a day (TID) | ORAL | Status: DC
  Administered 2016-01-18 – 2016-01-19 (×5): 5 mg via ORAL
  Filled 2016-01-18 (×5): qty 1

## 2016-01-18 MED ORDER — CLOPIDOGREL BISULFATE 75 MG PO TABS: 75.0000 mg | ORAL_TABLET | Freq: Every day | ORAL | Status: DC

## 2016-01-18 MED ORDER — ONDANSETRON HCL 4 MG PO TABS: 4.0000 mg | ORAL_TABLET | Freq: Four times a day (QID) | ORAL | Status: DC | PRN

## 2016-01-18 MED ORDER — ONDANSETRON HCL 4 MG/2ML IJ SOLN
INTRAMUSCULAR | Status: AC
  Administered 2016-01-18: 4 mg
  Filled 2016-01-18: qty 2

## 2016-01-18 MED ORDER — SODIUM CHLORIDE 0.9% FLUSH
3.0000 mL | Freq: Two times a day (BID) | INTRAVENOUS | Status: DC
  Administered 2016-01-18 – 2016-01-19 (×2): 3 mL via INTRAVENOUS

## 2016-01-18 MED ORDER — LEVOTHYROXINE SODIUM 50 MCG PO TABS
50.0000 ug | ORAL_TABLET | Freq: Every day | ORAL | Status: DC
  Administered 2016-01-18 – 2016-01-19 (×2): 50 ug via ORAL
  Filled 2016-01-18 (×2): qty 1

## 2016-01-18 MED ORDER — SODIUM CHLORIDE 0.9% FLUSH
3.0000 mL | Freq: Two times a day (BID) | INTRAVENOUS | Status: DC
  Administered 2016-01-18: 3 mL via INTRAVENOUS

## 2016-01-18 MED ORDER — GUAIFENESIN 100 MG/5ML PO SOLN
5.0000 mL | ORAL | Status: DC | PRN
  Administered 2016-01-18: 100 mg via ORAL
  Filled 2016-01-18: qty 5

## 2016-01-18 MED ORDER — SODIUM CHLORIDE 0.9% FLUSH: 3.0000 mL | INTRAVENOUS | Status: DC | PRN

## 2016-01-18 MED ORDER — ONDANSETRON HCL 4 MG/2ML IJ SOLN
4.0000 mg | Freq: Four times a day (QID) | INTRAMUSCULAR | Status: DC | PRN
  Administered 2016-01-18 – 2016-01-19 (×2): 4 mg via INTRAVENOUS
  Filled 2016-01-18 (×2): qty 2

## 2016-01-18 MED ORDER — NICOTINE 7 MG/24HR TD PT24
7.0000 mg | MEDICATED_PATCH | Freq: Every day | TRANSDERMAL | Status: DC
  Administered 2016-01-18 – 2016-01-19 (×2): 7 mg via TRANSDERMAL
  Filled 2016-01-18 (×2): qty 1

## 2016-01-18 MED ORDER — LEVETIRACETAM 500 MG PO TABS
500.0000 mg | ORAL_TABLET | Freq: Two times a day (BID) | ORAL | Status: DC
  Administered 2016-01-18 – 2016-01-19 (×3): 500 mg via ORAL
  Filled 2016-01-18 (×3): qty 1

## 2016-01-18 MED ORDER — PANTOPRAZOLE SODIUM 40 MG PO TBEC
40.0000 mg | DELAYED_RELEASE_TABLET | Freq: Two times a day (BID) | ORAL | Status: DC
  Administered 2016-01-18 – 2016-01-19 (×3): 40 mg via ORAL
  Filled 2016-01-18 (×3): qty 1

## 2016-01-18 MED ORDER — ZINC OXIDE 20 % EX OINT
1.0000 | TOPICAL_OINTMENT | Freq: Every day | CUTANEOUS | Status: DC | PRN
Start: 2016-01-18 — End: 2016-01-19
  Filled 2016-01-18: qty 28.35

## 2016-01-18 NOTE — Progress Notes (Signed)
Pre dialysis assessment 

## 2016-01-18 NOTE — Care Management Obs Status (Signed)
[facility] NOTIFICATION   Patient Details  Name: Sherri Davidson MRN: [id number] Date of Birth: 12/06/1955   Medicare Observation Status Notification Given:  Yes    Ival Bible, RN 01/18/2016, 5:49 PM

## 2016-01-18 NOTE — Progress Notes (Signed)
[facility] [place] NAME: Sherri Davidson    MR#:  [id number]  DATE OF BIRTH:  03-17-1956  SUBJECTIVE:  CHIEF COMPLAINT:  Pt is resting comfortably , no chest pain , PD daily at home  REVIEW OF SYSTEMS:  CONSTITUTIONAL: No fever, fatigue or weakness.  EYES: No blurred or double vision.  EARS, NOSE, AND THROAT: No tinnitus or ear pain.  RESPIRATORY: Has cough, shortness of breath, wheezing or hemoptysis.  CARDIOVASCULAR: No chest pain, orthopnea, edema.  GASTROINTESTINAL: No nausea, vomiting, diarrhea or abdominal pain.  GENITOURINARY: No dysuria, hematuria.  ENDOCRINE: No polyuria, nocturia,  HEMATOLOGY: No anemia, easy bruising or bleeding SKIN: No rash or lesion. MUSCULOSKELETAL: No joint pain or arthritis.   NEUROLOGIC: No tingling, numbness, weakness.  PSYCHIATRY: No anxiety or depression.   DRUG ALLERGIES:   Allergies  Allergen Reactions  . Gabapentin Anaphylaxis and Other (See Comments)    Pt states she cant breathe  . Adhesive [Tape] Hives    Please use paper tape  . Salicylates Hives and Nausea Only    Uncoated.  Diona Fanti [Aspirin] Other (See Comments)    GI upset for non-enteric coated aspirin    VITALS:  Blood pressure (!) 157/86, pulse 82, temperature 98.3 F (36.8 C), resp. rate 16, height '5\' 5"'$  (1.651 m), weight 56.8 kg (125 lb 4.8 oz), SpO2 96 %.  PHYSICAL EXAMINATION:  GENERAL:  60 y.o.-year-old patient lying in the bed with no acute distress.  EYES: Pupils equal, round, reactive to light and accommodation. No scleral icterus. Extraocular muscles intact.  HEENT: Head atraumatic, normocephalic. Oropharynx and nasopharynx clear.  NECK:  Supple, no jugular venous distention. No thyroid enlargement, no tenderness.  LUNGS: Mod breath sounds bilaterally, no wheezing, rales,rhonchi , has crepitation. No use of accessory muscles of respiration.  CARDIOVASCULAR: S1, S2 normal. No murmurs, rubs, or gallops.  ABDOMEN:  Soft, nontender, nondistended. Bowel sounds present. No organomegaly or mass.  EXTREMITIES: No pedal edema, cyanosis, or clubbing.  NEUROLOGIC: Cranial nerves II through XII are intact. Muscle strength 5/5 in all extremities. Sensation intact. Gait not checked.  PSYCHIATRIC: The patient is alert and oriented x 3.  SKIN: No obvious rash, lesion, or ulcer.    LABORATORY PANEL:   CBC  Recent Labs Lab 01/18/16 0718  WBC 14.6*  HGB 11.2*  HCT 33.8*  PLT 431   ------------------------------------------------------------------------------------------------------------------  Chemistries   Recent Labs Lab 01/18/16 0117 01/18/16 0718  NA 134* 134*  K 4.6 5.5*  CL 105 107  CO2 18* 16*  GLUCOSE 145* 117*  BUN 36* 37*  CREATININE 5.95* 6.32*  CALCIUM 8.4* 8.4*  AST 31  --   ALT 15  --   ALKPHOS 166*  --   BILITOT 0.2*  --    ------------------------------------------------------------------------------------------------------------------  Cardiac Enzymes  Recent Labs Lab 01/18/16 1902  TROPONINI 0.18*   ------------------------------------------------------------------------------------------------------------------  RADIOLOGY:  Dg Chest Portable 1 View  Result Date: 01/18/2016 CLINICAL DATA:  Cough and gagging.   Nonproductive cough for 1 day. EXAM: PORTABLE CHEST 1 VIEW COMPARISON:  Radiographs and CT 12/02/2015 FINDINGS: Unchanged right lower lobe opacity with right pleural effusion and adjacent airspace disease. This is unchanged from prior radiographs and CT. Crowding of bronchovascular structures diffusely secondary to lower lung volumes, probable vascular congestion. No focal opacity in the left lung. The heart and mediastinal contours are unchanged. No pneumothorax. Unchanged osseous structures with chronic degenerative change of the right shoulder. IMPRESSION: Chronic right lung  base opacity, pleural effusion and rounded opacity on prior CTs. This is unchanged from  priors. Probable vascular congestion. There is bronchovascular crowding secondary to lower lung volumes. Electronically Signed   By: Jeb Levering M.D.   On: 01/18/2016 01:55    EKG:   Orders placed or performed during the hospital encounter of 01/18/16  . EKG 12-Lead  . EKG 12-Lead    ASSESSMENT AND PLAN:  60 year old female patient with history of end-stage renal is on peritoneal dialysis, lung cancer,, CVA, congestive heart failure, seizure disorder, hypothyroidism presented to the emergency room with cough and nausea.  1. Community-acquired pneumonia Continue Rocephin and zithromycin Sputum cultures if can collect sputum   2. Abnormal troponin could be secondary to renal failure Pt is asymptomatic, denies any chest pain pafib - consulted pt prim cardiology dr.Khan  3. End-stage renal disease on peritoneal dialysis Continue PD F/U with nephrology   4. Mild hyponatremia and hyperkalemia kayexlate given, d/ced home dose KCL  Check BMP in am  5. Seizure disorder Continue Keppra      All the records are reviewed and case discussed with Care Management/Social Workerr. Management plans discussed with the patient, family and they are in agreement.  CODE STATUS: fc  TOTAL TIME TAKING CARE OF THIS PATIENT: 36 minutes.   POSSIBLE D/C IN 2 DAYS, DEPENDING ON CLINICAL CONDITION.  Note: This dictation was prepared with Dragon dictation along with smaller phrase technology. Any transcriptional errors that result from this process are unintentional.   Nicholes Mango M.D on 01/18/2016 at 9:21 PM  Between 7am to 6pm - Pager - [phone] After 6pm go to [email or web address] - password [email or web address] [place]  Office  [phone]  CC: Primary care physician; [facility]

## 2016-01-18 NOTE — H&P (Signed)
Sherri Davidson at [place] NAME: Sherri Davidson    MR#:  [id number]  DATE OF BIRTH:  1955-11-09  DATE OF ADMISSION:  01/18/2016  PRIMARY CARE PHYSICIAN: [facility]   REQUESTING/REFERRING PHYSICIAN: Jeannie Fend MD  CHIEF COMPLAINT:   Chief Complaint  Patient presents with  . Cough    HISTORY OF PRESENT ILLNESS: Sherri Davidson  is a 60 y.o. female with a known history of Lung cancer, end-stage renal disease on peritoneal dialysis, diabetes mellitus, congestive heart failure, CVA, seizure disorder, hypothyroidism presented to the emergency room with cough. Around midnight around 12 AM this morning patient woke up she had some gagging and also felt there was something in her throat. She coughed couple of times and also felt short of breath. Patient's family checked on her and brought to the emergency room. Patient is on peritoneal dialysis for end-stage renal disease since 2011. Patient's gets dialyzed every day but did not do dialysis on Saturday. No complaints of chest pain. No complaints of any orthopnea. Patient does not use any oxygen at home. She was worked up in the emergency room her troponin was elevated around 0.09. EKG shows normal sinus rhythm with no ST segment changes. Chest x-ray showed right lung opacity. No history of sick contacts at home. According to family member patient had low-grade fever on Friday around 46F. The cough is dry in nature. Patient also had nausea since yesterday and felt like she was about to vomit .Hospitalist service was consulted for further care of the patient.  PAST MEDICAL HISTORY:   Past Medical History:  Diagnosis Date  . Blood clot in vein right hand  . Cancer ([place])    lung cancer  . CHF (congestive heart failure) ([place])   . CVA, old, hemiparesis ([place])   . Diabetes ([place])   . ESRD on peritoneal dialysis ([place])   . Hypertension   . Hypothyroidism   . Seizures ([place])   . Stroke  [facility])     PAST SURGICAL HISTORY: Past Surgical History:  Procedure Laterality Date  . AMPUTATION TOE    . CHOLECYSTECTOMY    . INSERTION OF DIALYSIS CATHETER      SOCIAL HISTORY:  Social History  Substance Use Topics  . Smoking status: Current Every Day Smoker    Packs/day: 0.25    Years: 30.00    Types: Cigarettes  . Smokeless tobacco: Never Used  . Alcohol use No    FAMILY HISTORY:  Family History  Problem Relation Age of Onset  . CVA Mother   . CVA Father   . Heart failure Sister     DRUG ALLERGIES:  Allergies  Allergen Reactions  . Gabapentin Anaphylaxis and Other (See Comments)    Pt states she cant breathe  . Adhesive [Tape] Hives    Please use paper tape  . Salicylates Hives and Nausea Only    Uncoated.  Diona Fanti [Aspirin] Other (See Comments)    GI upset for non-enteric coated aspirin    REVIEW OF SYSTEMS:   CONSTITUTIONAL: Had low grade fever, has weakness.  EYES: No blurred or double vision.  EARS, NOSE, AND THROAT: No tinnitus or ear pain.  RESPIRATORY: has cough, had shortness of breath,no wheezing or hemoptysis.  CARDIOVASCULAR: No chest pain, orthopnea, edema.  GASTROINTESTINAL: had nausea, vomiting, no diarrhea or abdominal pain.  GENITOURINARY: No dysuria, hematuria.  ENDOCRINE: No polyuria, nocturia,  HEMATOLOGY: No anemia, easy bruising or bleeding SKIN: No  rash or lesion. MUSCULOSKELETAL: No joint pain or arthritis.   NEUROLOGIC: No tingling, numbness, weakness.  PSYCHIATRY: No anxiety or depression.   MEDICATIONS AT HOME:  Prior to Admission medications   Medication Sig Start Date End Date Taking? Authorizing Provider  aspirin EC 81 MG tablet Take 1 tablet (81 mg total) by mouth daily. For heart protection 11/28/15  Yes Cherene Altes, MD  clopidogrel (PLAVIX) 75 MG tablet Take 1 tablet (75 mg total) by mouth daily. For heart protection 11/28/15  Yes Cherene Altes, MD  levETIRAcetam (KEPPRA) 500 MG tablet Take 1 tablet (500 mg  total) by mouth 2 (two) times daily. Patient taking differently: Take 500 mg by mouth 2 (two) times daily. For seizure prevention 05/28/15  Yes Vaughan Basta, MD  levothyroxine (SYNTHROID, LEVOTHROID) 50 MCG tablet Take 50 mcg by mouth daily. For hypothyroidism   Yes Historical Provider, MD  loperamide (IMODIUM) 2 MG capsule Take 2-4 mg by mouth as needed for diarrhea or loose stools.   Yes Historical Provider, MD  Maltodextrin-Xanthan Gum ([place]) POWD Use as per product instructions to thicken liquids to nectar thick consistency. 11/21/15  Yes Cherene Altes, MD  midodrine (PROAMATINE) 5 MG tablet Take 1 tablet (5 mg total) by mouth 3 (three) times daily with meals. Patient taking differently: Take 5 mg by mouth 2 (two) times daily with a meal.  11/21/15  Yes Cherene Altes, MD  multivitamin (RENA-VIT) TABS tablet Take 1 tablet by mouth at bedtime. Patient taking differently: Take 1 tablet by mouth daily.  11/21/15  Yes Cherene Altes, MD  ondansetron (ZOFRAN ODT) 4 MG disintegrating tablet Take 1 tablet (4 mg total) by mouth every 8 (eight) hours as needed for nausea or vomiting. 12/03/15  Yes Hillary Bow, MD  oxyCODONE-acetaminophen (PERCOCET/ROXICET) 5-325 MG tablet Take 1 tablet by mouth daily as needed for moderate pain or severe pain.   Yes Historical Provider, MD  pantoprazole (PROTONIX) 40 MG tablet Take 1 tablet (40 mg total) by mouth 2 (two) times daily before a meal. 12/03/15  Yes Srikar Sudini, MD  potassium chloride SA (K-DUR,KLOR-CON) 20 MEQ tablet Take 20 mEq by mouth daily.    Yes Historical Provider, MD  ranitidine (ZANTAC) 150 MG tablet Take 150 mg by mouth daily as needed for heartburn.   Yes Historical Provider, MD  simvastatin (ZOCOR) 20 MG tablet Take 20 mg by mouth at bedtime.    Yes Historical Provider, MD  Vitamin D, Ergocalciferol, (DRISDOL) 50000 units CAPS capsule Take 50,000 Units by mouth every 30 (thirty) days. On or about the 1st of each  month   Yes Historical Provider, MD  Zinc Oxide 13 % CREA Apply 1 application topically daily as needed (for barrier cream on buttocks while toileting).   Yes Historical Provider, MD      PHYSICAL EXAMINATION:   VITAL SIGNS: Blood pressure 140/85, pulse 92, temperature 97.8 F (36.6 C), temperature source Oral, resp. rate (!) 24, height '5\' 5"'$  (1.651 m), weight 73.5 kg (162 lb), SpO2 98 %.  GENERAL:  60 y.o.-year-old Middle-aged female patient lying in the bed with no acute distress.  EYES: Pupils equal, round, reactive to light and accommodation. No scleral icterus. Extraocular muscles intact.  HEENT: Head atraumatic, normocephalic. Oropharynx and nasopharynx clear.  NECK:  Supple, no jugular venous distention. No thyroid enlargement, no tenderness.  LUNGS: Normal breath sounds bilaterally, no wheezing,scattered rales in right lung. No use of accessory muscles of respiration.  CARDIOVASCULAR: S1,  S2 normal. No murmurs, rubs, or gallops.  ABDOMEN: Soft, nontender, nondistended. Bowel sounds present. No organomegaly or mass. Peritoneal dialysis access site noted. EXTREMITIES: No pedal edema, cyanosis, or clubbing.  NEUROLOGIC: Cranial nerves II through XII are intact. Muscle strength 5/5 in all extremities. Sensation intact. Gait not checked.  PSYCHIATRIC: The patient is alert and oriented x 3.  SKIN: No obvious rash, lesion, or ulcer.   LABORATORY PANEL:   CBC  Recent Labs Lab 01/18/16 0117  WBC 15.5*  HGB 12.0  HCT 36.0  PLT 417  MCV 87.3  MCH 29.0  MCHC 33.2  RDW 18.5*   ------------------------------------------------------------------------------------------------------------------  Chemistries   Recent Labs Lab 01/18/16 0117  NA 134*  K 4.6  CL 105  CO2 18*  GLUCOSE 145*  BUN 36*  CREATININE 5.95*  CALCIUM 8.4*  AST 31  ALT 15  ALKPHOS 166*  BILITOT 0.2*    ------------------------------------------------------------------------------------------------------------------ estimated creatinine clearance is 10.2 mL/min (by C-G formula based on SCr of 5.95 mg/dL). ------------------------------------------------------------------------------------------------------------------ No results for input(s): TSH, T4TOTAL, T3FREE, THYROIDAB in the last 72 hours.  Invalid input(s): FREET3   Coagulation profile No results for input(s): INR, PROTIME in the last 168 hours. ------------------------------------------------------------------------------------------------------------------- No results for input(s): DDIMER in the last 72 hours. -------------------------------------------------------------------------------------------------------------------  Cardiac Enzymes  Recent Labs Lab 01/18/16 0117  TROPONINI 0.09*   ------------------------------------------------------------------------------------------------------------------ Invalid input(s): POCBNP  ---------------------------------------------------------------------------------------------------------------  Urinalysis    Component Value Date/Time   COLORURINE YELLOW 11/08/2015 1707   APPEARANCEUR TURBID (A) 11/08/2015 1707   APPEARANCEUR Clear 05/30/2014 1618   LABSPEC 1.018 11/08/2015 1707   LABSPEC 1.009 05/30/2014 1618   PHURINE 7.5 11/08/2015 1707   GLUCOSEU 100 (A) 11/08/2015 1707   GLUCOSEU >=500 05/30/2014 1618   HGBUR LARGE (A) 11/08/2015 1707   BILIRUBINUR NEGATIVE 11/08/2015 1707   BILIRUBINUR Negative 05/30/2014 1618   KETONESUR NEGATIVE 11/08/2015 1707   PROTEINUR >300 (A) 11/08/2015 1707   NITRITE NEGATIVE 11/08/2015 1707   LEUKOCYTESUR LARGE (A) 11/08/2015 1707   LEUKOCYTESUR Negative 05/30/2014 1618     RADIOLOGY: Dg Chest Portable 1 View  Result Date: 01/18/2016 CLINICAL DATA:  Cough and gagging.   Nonproductive cough for 1 day. EXAM: PORTABLE CHEST 1 VIEW  COMPARISON:  Radiographs and CT 12/02/2015 FINDINGS: Unchanged right lower lobe opacity with right pleural effusion and adjacent airspace disease. This is unchanged from prior radiographs and CT. Crowding of bronchovascular structures diffusely secondary to lower lung volumes, probable vascular congestion. No focal opacity in the left lung. The heart and mediastinal contours are unchanged. No pneumothorax. Unchanged osseous structures with chronic degenerative change of the right shoulder. IMPRESSION: Chronic right lung base opacity, pleural effusion and rounded opacity on prior CTs. This is unchanged from priors. Probable vascular congestion. There is bronchovascular crowding secondary to lower lung volumes. Electronically Signed   By: Jeb Levering M.D.   On: 01/18/2016 01:55    EKG: Orders placed or performed during the hospital encounter of 01/18/16  . EKG 12-Lead  . EKG 12-Lead    IMPRESSION AND PLAN: 60 year old female patient with history of end-stage renal is on peritoneal dialysis, lung cancer,, CVA, congestive heart failure, seizure disorder, hypothyroidism presented to the emergency room with cough and nausea. Admitting diagnosis : 1. Community-acquired pneumonia 2. Abnormal troponin could be secondary to renal failure 3. End-stage renal disease on peritoneal dialysis 4. Mild hyponatremia 5. Seizure disorder Treatment plan : Admit patient to telemetry observation bed Cycle troponin to rule out ischemia Check echocardiogram Resume aspirin and Plavix  Start patient on IV Rocephin and IV Zithromax antibiotics Nephrology consultation for renal failure Peritoneal dialysis to be resumed Follow-up sodium level DVT prophylaxis with subcutaneous heparin. Supportive care.  All the records are reviewed and case discussed with ED provider. Management plans discussed with the patient, family and they are in agreement.  CODE STATUS:FULL Code Status History    Date Active Date  Inactive Code Status Order ID Comments User Context   12/02/2015  3:15 PM 12/03/2015  9:02 PM Full Code [id number]  Vaughan Basta, MD Inpatient   11/08/2015 10:33 PM 11/21/2015  5:53 PM Full Code [id number]  Norval Morton, MD Inpatient   08/17/2015  8:07 AM 08/18/2015  6:39 PM Full Code [id number]  Epifanio Lesches, MD ED   05/24/2015  3:42 AM 05/28/2015  5:34 PM Full Code [id number]  Saundra Shelling, MD Inpatient       TOTAL TIME TAKING CARE OF THIS PATIENT: 54 minutes.    Saundra Shelling M.D on 01/18/2016 at 4:21 AM  Between 7am to 6pm - Pager - [phone]  After 6pm go to [email or web address] - password [email or web address] [place] [facility]  Office  [phone]  CC: Primary care physician; [facility]

## 2016-01-18 NOTE — ED Notes (Signed)
Pt changed, new linens given, and repositioned.

## 2016-01-18 NOTE — Progress Notes (Signed)
[facility]  ROUNDING NOTE   Subjective:   Admitted on 01/18/2016 for Nausea [R11.0] Elevated troponin [R79.89] T wave inversion in EKG [R94.31]   Found to have pneumonia. Started on ceftriaxone and azithromycin  In atrial fibrillation overnight.   Objective:  Vital signs in last 24 hours:  Temp:  [97.8 F (36.6 C)-98.4 F (36.9 C)] 97.9 F (36.6 C) (08/06 0800) Pulse Rate:  [81-108] 94 (08/06 0800) Resp:  [15-38] 20 (08/06 0800) BP: (125-198)/(74-165) 125/87 (08/06 0800) SpO2:  [92 %-99 %] 92 % (08/06 0800) Weight:  [56.8 kg (125 lb 4.8 oz)-73.5 kg (162 lb)] 56.8 kg (125 lb 4.8 oz) (08/06 0520)  Weight change:  Filed Weights   01/18/16 0108 01/18/16 0520  Weight: 73.5 kg (162 lb) 56.8 kg (125 lb 4.8 oz)    Intake/Output: No intake/output data recorded.   Intake/Output this shift:  No intake/output data recorded.  Physical Exam: General: NAD, sitting up in bed.   Head: adentulous  Eyes: Anicteric, PERRL  Neck: Supple, trachea midline  Lungs:  Clear to auscultation  Heart: Regular rate and rhythm  Abdomen:  Soft, nontender,   Extremities:  no peripheral edema.  Neurologic: Nonfocal, moving all four extremities  Skin: No lesions  Access: PD catheter    Basic Metabolic Panel:  Recent Labs Lab 01/18/16 0117 01/18/16 0718  NA 134* 134*  K 4.6 5.5*  CL 105 107  CO2 18* 16*  GLUCOSE 145* 117*  BUN 36* 37*  CREATININE 5.95* 6.32*  CALCIUM 8.4* 8.4*    Liver Function Tests:  Recent Labs Lab 01/18/16 0117  AST 31  ALT 15  ALKPHOS 166*  BILITOT 0.2*  PROT 6.9  ALBUMIN 2.1*   No results for input(s): LIPASE, AMYLASE in the last 168 hours. No results for input(s): AMMONIA in the last 168 hours.  CBC:  Recent Labs Lab 01/18/16 0117 01/18/16 0718  WBC 15.5* 14.6*  HGB 12.0 11.2*  HCT 36.0 33.8*  MCV 87.3 87.1  PLT 417 431    Cardiac Enzymes:  Recent Labs Lab 01/18/16 0117 01/18/16 0718  TROPONINI 0.09* 0.23*     BNP: Invalid input(s): POCBNP  CBG: No results for input(s): GLUCAP in the last 168 hours.  Microbiology: Results for orders placed or performed during the hospital encounter of 11/08/15  MRSA PCR Screening     Status: Abnormal   Collection Time: 11/08/15  9:58 PM  Result Value Ref Range Status   MRSA by PCR POSITIVE (A) NEGATIVE Final    Comment:        The GeneXpert MRSA Assay (FDA approved for NASAL specimens only), is one component of a comprehensive MRSA colonization surveillance program. It is not intended to diagnose MRSA infection nor to guide or monitor treatment for MRSA infections. RESULT CALLED TO, READ BACK BY AND VERIFIED WITH: [name],[name] [phone] 11/09/15 MITCHELL,L   C difficile quick scan w PCR reflex     Status: None   Collection Time: 11/12/15 10:47 PM  Result Value Ref Range Status   C Diff antigen NEGATIVE NEGATIVE Final   C Diff toxin NEGATIVE NEGATIVE Final   C Diff interpretation Negative for toxigenic C. difficile  Final  Culture, blood (routine x 2)     Status: None   Collection Time: 11/15/15  2:47 PM  Result Value Ref Range Status   Specimen Description BLOOD RIGHT WRIST  Final   Special Requests IN PEDIATRIC BOTTLE 1.5 CC  Final   Culture NO GROWTH 5  DAYS  Final   Report Status 11/20/2015 FINAL  Final  Culture, blood (routine x 2)     Status: None   Collection Time: 11/15/15  2:51 PM  Result Value Ref Range Status   Specimen Description BLOOD RIGHT WRIST  Final   Special Requests IN PEDIATRIC BOTTLE 1.5CC  Final   Culture NO GROWTH 5 DAYS  Final   Report Status 11/20/2015 FINAL  Final    Coagulation Studies: No results for input(s): LABPROT, INR in the last 72 hours.  Urinalysis: No results for input(s): COLORURINE, LABSPEC, PHURINE, GLUCOSEU, HGBUR, BILIRUBINUR, KETONESUR, PROTEINUR, UROBILINOGEN, NITRITE, LEUKOCYTESUR in the last 72 hours.  Invalid input(s): APPERANCEUR    Imaging: Dg Chest Portable 1 View  Result Date:  01/18/2016 CLINICAL DATA:  Cough and gagging.   Nonproductive cough for 1 day. EXAM: PORTABLE CHEST 1 VIEW COMPARISON:  Radiographs and CT 12/02/2015 FINDINGS: Unchanged right lower lobe opacity with right pleural effusion and adjacent airspace disease. This is unchanged from prior radiographs and CT. Crowding of bronchovascular structures diffusely secondary to lower lung volumes, probable vascular congestion. No focal opacity in the left lung. The heart and mediastinal contours are unchanged. No pneumothorax. Unchanged osseous structures with chronic degenerative change of the right shoulder. IMPRESSION: Chronic right lung base opacity, pleural effusion and rounded opacity on prior CTs. This is unchanged from priors. Probable vascular congestion. There is bronchovascular crowding secondary to lower lung volumes. Electronically Signed   By: Jeb Levering M.D.   On: 01/18/2016 01:55     Medications:     . aspirin EC  81 mg Oral Daily  . azithromycin  500 mg Intravenous Q24H  . cefTRIAXone (ROCEPHIN)  IV  1 g Intravenous Q24H  . clopidogrel  75 mg Oral Daily  . heparin  5,000 Units Subcutaneous Q8H  . levETIRAcetam  500 mg Oral BID  . levothyroxine  50 mcg Oral QAC breakfast  . midodrine  5 mg Oral TID WC  . multivitamin  1 tablet Oral QHS  . pantoprazole  40 mg Oral BID AC  . simvastatin  20 mg Oral QHS  . sodium chloride flush  3 mL Intravenous Q12H  . sodium chloride flush  3 mL Intravenous Q12H  . [START ON 02/13/2016] Vitamin D (Ergocalciferol)  50,000 Units Oral Q30 days   sodium chloride, acetaminophen **OR** acetaminophen, guaiFENesin, ondansetron **OR** ondansetron (ZOFRAN) IV, oxyCODONE-acetaminophen, senna-docusate, sodium chloride flush, zinc oxide  Assessment/ Plan:  [name] is a 60 y.o. white female with CVA, Hypotension, diabetes mellitus type II, congestive heart failure, seizure disorder, hypothyroidism, DVT, presents 12/02/2015 for Esophagitis [K20.9]   [facility]  [name] Peritoneal dialysis  Prescription: CCPD 8 hours with 6 exchanges 2 litre fills  1. ESRD with hyperkalemia: on peritoneal dialysis. Did not get dialysis treatment last night.  - peritoneal dialysis tonight - Takes potassium chloride at home, discontinued   2. Anemia of chronic kidney disease: hemoglobin at goal  3. Hypotension: elevated on admission - midodrine.   4. Secondary Hyperparathyroidism:  Outpatient PTH, phosphorus and calcium at goal. - Does not currently take a binder as an outpatient.    LOS: 0 Sherri Davidson 8/6/201711:14 AM

## 2016-01-18 NOTE — Progress Notes (Signed)
Pt irritable and impulsive this am. Pulled out iv which I restarted. Some diaphoresis and no fever. Iv ab's for pneumonia. Peritoneal dialysis to resume this evening. Pt's appetite poor. Med once for nausea. Resting quietly at present.

## 2016-01-18 NOTE — ED Provider Notes (Signed)
[facility] Emergency Department Provider Note   ____________________________________________  The patient was seen at approximately 00 55   (approximate)  I have reviewed the triage vital signs and the nursing notes.   HISTORY  Chief Complaint Cough    HPI Sherri Davidson is a 60 y.o. female who comes into the hospital today by EMS with a coughing or gagging sensation. According to EMS the patient seemed like she was choking. She does have a history of a CVA as well as lung cancer. The patient is also non-ambulatory because of the CVAs in the past. The patient does peritoneal dialysis. The family reports that she was coughing and gagging which is why they called EMS. EMS reports that on the way here the symptoms improved and the patient was back to being normal. The patient reports that she's feels okay and nothing is wrong with her. The patient does have a cough but reports that she is not feeling nauseous. She reports that she had to vomit which is why she was gagging. She denies any chest pain, shortness of breath, abdominal pain.   Past Medical History:  Diagnosis Date  . Blood clot in vein right hand  . Cancer ([place])    lung cancer  . CHF (congestive heart failure) ([place])   . CVA, old, hemiparesis ([place])   . Diabetes ([place])   . ESRD on peritoneal dialysis ([place])   . Hypertension   . Hypothyroidism   . Seizures ([place])   . Stroke [facility])     Patient Active Problem List   Diagnosis Date Noted  . Elevated troponin 01/18/2016  . Pneumonia 01/18/2016  . Hematemesis 12/02/2015  . Multisystem organ failure   . Controlled diabetes mellitus type 2 with complications ([place])   . ESRD on peritoneal dialysis ([place])   . Dysphagia   . Seizures ([place]) 11/08/2015  . Vomiting 08/17/2015  . Seizure ([place]) 05/24/2015  . Community acquired pneumonia 05/24/2015  . Pressure ulcer 05/24/2015    Past Surgical History:  Procedure Laterality Date  . AMPUTATION TOE    .  CHOLECYSTECTOMY    . INSERTION OF DIALYSIS CATHETER      Prior to Admission medications   Medication Sig Start Date End Date Taking? Authorizing Provider  aspirin EC 81 MG tablet Take 1 tablet (81 mg total) by mouth daily. For heart protection 11/28/15  Yes Cherene Altes, MD  clopidogrel (PLAVIX) 75 MG tablet Take 1 tablet (75 mg total) by mouth daily. For heart protection 11/28/15  Yes Cherene Altes, MD  levETIRAcetam (KEPPRA) 500 MG tablet Take 1 tablet (500 mg total) by mouth 2 (two) times daily. Patient taking differently: Take 500 mg by mouth 2 (two) times daily. For seizure prevention 05/28/15  Yes Vaughan Basta, MD  levothyroxine (SYNTHROID, LEVOTHROID) 50 MCG tablet Take 50 mcg by mouth daily. For hypothyroidism   Yes Historical Provider, MD  loperamide (IMODIUM) 2 MG capsule Take 2-4 mg by mouth as needed for diarrhea or loose stools.   Yes Historical Provider, MD  Maltodextrin-Xanthan Gum ([place]) POWD Use as per product instructions to thicken liquids to nectar thick consistency. 11/21/15  Yes Cherene Altes, MD  midodrine (PROAMATINE) 5 MG tablet Take 1 tablet (5 mg total) by mouth 3 (three) times daily with meals. Patient taking differently: Take 5 mg by mouth 2 (two) times daily with a meal.  11/21/15  Yes Cherene Altes, MD  multivitamin (RENA-VIT) TABS tablet Take 1  tablet by mouth at bedtime. Patient taking differently: Take 1 tablet by mouth daily.  11/21/15  Yes Cherene Altes, MD  ondansetron (ZOFRAN ODT) 4 MG disintegrating tablet Take 1 tablet (4 mg total) by mouth every 8 (eight) hours as needed for nausea or vomiting. 12/03/15  Yes Hillary Bow, MD  oxyCODONE-acetaminophen (PERCOCET/ROXICET) 5-325 MG tablet Take 1 tablet by mouth daily as needed for moderate pain or severe pain.   Yes Historical Provider, MD  pantoprazole (PROTONIX) 40 MG tablet Take 1 tablet (40 mg total) by mouth 2 (two) times daily before a meal. 12/03/15  Yes Srikar  Sudini, MD  potassium chloride SA (K-DUR,KLOR-CON) 20 MEQ tablet Take 20 mEq by mouth daily.    Yes Historical Provider, MD  ranitidine (ZANTAC) 150 MG tablet Take 150 mg by mouth daily as needed for heartburn.   Yes Historical Provider, MD  simvastatin (ZOCOR) 20 MG tablet Take 20 mg by mouth at bedtime.    Yes Historical Provider, MD  Vitamin D, Ergocalciferol, (DRISDOL) 50000 units CAPS capsule Take 50,000 Units by mouth every 30 (thirty) days. On or about the 1st of each month   Yes Historical Provider, MD  Zinc Oxide 13 % CREA Apply 1 application topically daily as needed (for barrier cream on buttocks while toileting).   Yes Historical Provider, MD    Allergies Gabapentin; Adhesive [tape]; Salicylates; and Asa [aspirin]  Family History  Problem Relation Age of Onset  . CVA Mother   . CVA Father   . Heart failure Sister     Social History Social History  Substance Use Topics  . Smoking status: Current Every Day Smoker    Packs/day: 0.25    Years: 30.00    Types: Cigarettes  . Smokeless tobacco: Never Used  . Alcohol use No    Review of Systems Constitutional: No fever/chills Eyes: No visual changes. ENT: No sore throat. Cardiovascular: Denies chest pain. Respiratory: Coughing and tachypnea Gastrointestinal: Nausea and gagging No abdominal pain.  No diarrhea.  No constipation. Genitourinary: Negative for dysuria. Musculoskeletal: Negative for back pain. Skin: Negative for rash. Neurological: Negative for headaches, focal weakness or numbness.  10-point ROS otherwise negative.  ____________________________________________   PHYSICAL EXAM:  VITAL SIGNS: ED Triage Vitals  Enc Vitals Group     BP 01/18/16 0100 (!) 194/165     Pulse Rate 01/18/16 0100 (!) 107     Resp 01/18/16 0100 (!) 38     Temp 01/18/16 0108 97.8 F (36.6 C)     Temp Source 01/18/16 0108 Oral     SpO2 01/18/16 0100 93 %     Weight 01/18/16 0108 162 lb (73.5 kg)     Height 01/18/16 0108 5'  5" (1.651 m)     Head Circumference --      Peak Flow --      Pain Score 01/18/16 0115 0     Pain Loc --      Pain Edu? --      Excl. in [place]? --     Constitutional: Alert and oriented. Well appearing and in no acute distress. Eyes: Conjunctivae are normal. PERRL. EOMI. Head: Atraumatic. Nose: No congestion/rhinnorhea. Mouth/Throat: Mucous membranes are moist.  Oropharynx non-erythematous. Cardiovascular: Normal rate, regular rhythm. Grossly normal heart sounds.  Good peripheral circulation. Respiratory: Normal respiratory effort.  No retractions. Lungs CTAB. Gastrointestinal: Soft and nontender. No distention. Positive bowel sounds Musculoskeletal: No lower extremity tenderness nor edema.   Neurologic:  Normal speech and language.  Skin:  Skin is warm, dry and intact.  Psychiatric: Mood and affect are normal.   ____________________________________________   LABS (all labs ordered are listed, but only abnormal results are displayed)  Labs Reviewed  CBC - Abnormal; Notable for the following:       Result Value   WBC 15.5 (*)    RDW 18.5 (*)    All other components within normal limits  COMPREHENSIVE METABOLIC PANEL - Abnormal; Notable for the following:    Sodium 134 (*)    CO2 18 (*)    Glucose, Bld 145 (*)    BUN 36 (*)    Creatinine, Ser 5.95 (*)    Calcium 8.4 (*)    Albumin 2.1 (*)    Alkaline Phosphatase 166 (*)    Total Bilirubin 0.2 (*)    GFR calc non Af Amer 7 (*)    GFR calc Af Amer 8 (*)    All other components within normal limits  TROPONIN I - Abnormal; Notable for the following:    Troponin I 0.09 (*)    All other components within normal limits   ____________________________________________  EKG  ED ECG REPORT I, Loney Hering, the attending physician, personally viewed and interpreted this ECG.   Date: 01/18/2016  EKG Time: 100  Rate: 106  Rhythm: sinus tachycardia  Axis: Normal  Intervals:none  ST&T Change: Flipped T waves in  inferior lateral leads II, III and aVF, V5 and V6  ____________________________________________  RADIOLOGY  CXR: Chronic right lung base opacity, pleural effusion and rounded opacity on prior CTs. This is unchanged from priors. Probable vascular congestion. There is bronchovascular crowding secondary to lower lung volumes. ____________________________________________   PROCEDURES  Procedure(s) performed: None  Procedures  Critical Care performed: No  ____________________________________________   INITIAL IMPRESSION / ASSESSMENT AND PLAN / ED COURSE  Pertinent labs & imaging results that were available during my care of the patient were reviewed by me and considered in my medical decision making (see chart for details).  This is a 60 year old female who comes into the hospital today with some gagging and nausea. The patient is tachypnea. It appears that she does have an opacity at the right lung base but according to the x-ray it was seen on prior CT scans. We did do some blood work and appears as well that the patient has some mildly elevated troponin. The patient also has some flipped T waves in her inferior lateral leads which appear to be new from the last time she had an EKG. The patient's troponin also has not typically elevated. I will admit the patient to the hospitalist service and have her further evaluated for these symptoms.  Clinical Course   I discussed this with the patient's family and they understand and agree with the plan as stated.  ____________________________________________   FINAL CLINICAL IMPRESSION(S) / ED DIAGNOSES  Final diagnoses:  Nausea  Elevated troponin  T wave inversion in EKG      NEW MEDICATIONS STARTED DURING THIS VISIT:  New Prescriptions   No medications on file     Note:  This document was prepared using Dragon voice recognition software and may include unintentional dictation errors.    Loney Hering, MD 01/18/16  [phone]

## 2016-01-18 NOTE — Progress Notes (Deleted)
Patient converted to Vfib/Vtach this morning and went back to Afib again. No  acute distress noted.  Dr. Estanislado Pandy notified but no new order. Will continue to monitor.

## 2016-01-18 NOTE — Progress Notes (Signed)
Patient is admitted to room 246 with a diagnosis of elevated troponin. Alert and oriented x 2. No s/s of pain or any acute respiratory distress. Tele box called to CCMD with a box x # 24. Pt. and family were oriented to the room, staff, call bell and ascom.  Lattie Haw was a second Psychologist, counselling. 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 Will continue to monitor.

## 2016-01-18 NOTE — ED Triage Notes (Addendum)
Pt to ED via EMS for non productive, choking like cough.  Pt felt like she had to vomit, but was not able to so she called EMS. Pt denies any SOB. Denies any N/V. Pt has a history of stroke and receives peritoneal dialysis. Pt presents A&Ox4. Speaking clearly with nonproductive cough.

## 2016-01-18 NOTE — Care Management (Signed)
Caregiver wants noted that Patient will only take her medications with applesauce. She will not take her medications other wise.

## 2016-01-19 ENCOUNTER — Emergency Department: Payer: Medicare (Managed Care)

## 2016-01-19 ENCOUNTER — Encounter: Payer: Self-pay | Admitting: Emergency Medicine

## 2016-01-19 ENCOUNTER — Inpatient Hospital Stay
Admission: EM | Admit: 2016-01-19 | Discharge: 2016-01-22 | DRG: 190 | Disposition: A | Discharge status: Home / Self Care | Payer: Medicare (Managed Care) | Attending: Internal Medicine | Admitting: Internal Medicine

## 2016-01-19 DIAGNOSIS — Z7189 Other specified counseling: Secondary | ICD-10-CM

## 2016-01-19 DIAGNOSIS — J189 Pneumonia, unspecified organism: Secondary | ICD-10-CM | POA: Diagnosis present

## 2016-01-19 DIAGNOSIS — J440 Chronic obstructive pulmonary disease with acute lower respiratory infection: Secondary | ICD-10-CM | POA: Diagnosis not present

## 2016-01-19 DIAGNOSIS — R042 Hemoptysis: Secondary | ICD-10-CM | POA: Diagnosis not present

## 2016-01-19 DIAGNOSIS — Z515 Encounter for palliative care: Secondary | ICD-10-CM

## 2016-01-19 DIAGNOSIS — Z992 Dependence on renal dialysis: Secondary | ICD-10-CM

## 2016-01-19 LAB — BASIC METABOLIC PANEL
ANION GAP: 10 (ref 5–15)
BUN: 35 mg/dL — ABNORMAL HIGH (ref 6–20)
CHLORIDE: 106 mmol/L (ref 101–111)
CO2: 19 mmol/L — AB (ref 22–32)
Calcium: 8 mg/dL — ABNORMAL LOW (ref 8.9–10.3)
Creatinine, Ser: 6.02 mg/dL — ABNORMAL HIGH (ref 0.44–1.00)
GFR calc Af Amer: 8 mL/min — ABNORMAL LOW (ref >60)
GFR, EST NON AFRICAN AMERICAN: 7 mL/min — AB (ref >60)
GLUCOSE: 130 mg/dL — AB (ref 65–99)
POTASSIUM: 4.5 mmol/L (ref 3.5–5.1)
Sodium: 135 mmol/L (ref 135–145)

## 2016-01-19 LAB — CBC
HEMATOCRIT: 30.6 % — AB (ref 35.0–47.0)
HEMOGLOBIN: 10.4 g/dL — AB (ref 12.0–16.0)
MCH: 29.2 pg (ref 26.0–34.0)
MCHC: 33.9 g/dL (ref 32.0–36.0)
MCV: 86.1 fL (ref 80.0–100.0)
PLATELETS: 373 10*3/uL (ref 150–440)
RBC: 3.56 MIL/uL — AB (ref 3.80–5.20)
RDW: 18.3 % — ABNORMAL HIGH (ref 11.5–14.5)
WBC: 9.5 10*3/uL (ref 3.6–11.0)

## 2016-01-19 LAB — ECHOCARDIOGRAM COMPLETE
HEIGHTINCHES: 65 in
WEIGHTICAEL: 2004.8 [oz_av]

## 2016-01-19 MED ORDER — GUAIFENESIN 100 MG/5ML PO SOLN: 5.0000 mL | Freq: Four times a day (QID) | ORAL | 0 refills | Status: DC | PRN

## 2016-01-19 MED ORDER — ALBUTEROL SULFATE HFA 108 (90 BASE) MCG/ACT IN AERS: 2.0000 | INHALATION_SPRAY | Freq: Four times a day (QID) | RESPIRATORY_TRACT | 2 refills | Status: DC | PRN

## 2016-01-19 MED ORDER — NICOTINE 7 MG/24HR TD PT24: 7.0000 mg | MEDICATED_PATCH | Freq: Every day | TRANSDERMAL | 0 refills | Status: DC

## 2016-01-19 MED ORDER — MUPIROCIN 2 % EX OINT
1.0000 "application " | TOPICAL_OINTMENT | Freq: Two times a day (BID) | CUTANEOUS | Status: DC
  Administered 2016-01-19: 1 via NASAL
  Filled 2016-01-19: qty 22

## 2016-01-19 MED ORDER — MUPIROCIN 2 % EX OINT: 1.0000 "application " | TOPICAL_OINTMENT | Freq: Two times a day (BID) | CUTANEOUS | 0 refills | Status: AC

## 2016-01-19 MED ORDER — CHLORHEXIDINE GLUCONATE CLOTH 2 % EX PADS: 6.0000 | MEDICATED_PAD | Freq: Every day | CUTANEOUS | Status: DC

## 2016-01-19 MED ORDER — AMOXICILLIN-POT CLAVULANATE 875-125 MG PO TABS: 1.0000 | ORAL_TABLET | Freq: Two times a day (BID) | ORAL | 0 refills | Status: DC

## 2016-01-19 NOTE — Discharge Summary (Signed)
[facility] NAME: Sherri Davidson    MR#:  [id number]  DATE OF BIRTH:  [birth year]  DATE OF ADMISSION:  01/18/2016 ADMITTING PHYSICIAN: Saundra Shelling, MD  DATE OF DISCHARGE: 01/19/2016  PRIMARY CARE PHYSICIAN: [facility]    ADMISSION DIAGNOSIS:  Nausea [R11.0] Elevated troponin [R79.89] T wave inversion in EKG [R94.31]  DISCHARGE DIAGNOSIS:  Active Problems:   Elevated troponin   Pneumonia   SECONDARY DIAGNOSIS:   Past Medical History:  Diagnosis Date  . Blood clot in vein right hand  . Cancer ([place])    lung cancer  . CHF (congestive heart failure) ([place])   . CVA, old, hemiparesis ([place])   . Diabetes ([place])   . ESRD on peritoneal dialysis ([place])   . Hypertension   . Hypothyroidism   . Seizures ([place])   . Stroke [facility])     HOSPITAL COURSE:   60 year old female patient with history of end-stage renal is on peritoneal dialysis, lung cancer,, CVA, congestive heart failure, seizure disorder, hypothyroidism presented to the emergency room with cough and nausea. Please review history and physical for details  1.Community-acquired pneumonia Clinically improved with Rocephin and zithromycin. We will discharge patient home with by mouth Augmentin Sputum cultures not done as she does have blood collected  2.Abnormal troponin  secondary to renal failure Pt is asymptomatic, denies any chest pain pafib - consulted pt prim cardiology dr.Khan. Seen by Dr. Nehemiah Massed no further cardiac interventions needed at this time. Outpatient follow-up with patient's primary cardiologist  3.End-stage renal disease on peritoneal dialysis Continue PD F/U with nephrology as an outpatient. Discussed with Dr. Candiss Norse okay to discharge patient from Dr. Candiss Norse standpoint  4.Mild hyponatremia and hyperkalemia Kayexalate was given and discharged home dose KCl Sodium at 135 and potassium 4.5 today   5.Seizure  disorder Continue Keppra  DISCHARGE CONDITIONS:   Fair  CONSULTS OBTAINED:  Treatment Team:  Lavonia Dana, MD Corey Skains, MD   PROCEDURES None  DRUG ALLERGIES:   Allergies  Allergen Reactions  . Gabapentin Anaphylaxis and Other (See Comments)    Pt states she cant breathe  . Adhesive [Tape] Hives    Please use paper tape  . Salicylates Hives and Nausea Only    Uncoated.  Diona Fanti [Aspirin] Other (See Comments)    GI upset for non-enteric coated aspirin    DISCHARGE MEDICATIONS:   Current Discharge Medication List    START taking these medications   Details  albuterol (PROVENTIL HFA;VENTOLIN HFA) 108 (90 Base) MCG/ACT inhaler Inhale 2 puffs into the lungs every 6 (six) hours as needed for wheezing or shortness of breath. Qty: 1 Inhaler, Refills: 2    amoxicillin-clavulanate (AUGMENTIN) 875-125 MG tablet Take 1 tablet by mouth 2 (two) times daily. Qty: 14 tablet, Refills: 0    guaiFENesin (ROBITUSSIN) 100 MG/5ML SOLN Take 5 mLs (100 mg total) by mouth every 6 (six) hours as needed for cough or to loosen phlegm. Qty: 1200 mL, Refills: 0    mupirocin ointment (BACTROBAN) 2 % Place 1 application into the nose 2 (two) times daily. Qty: 1 g, Refills: 0    nicotine (NICODERM CQ - DOSED IN MG/24 HR) 7 mg/24hr patch Place 1 patch (7 mg total) onto the skin daily. Qty: 28 patch, Refills: 0      CONTINUE these medications which have NOT CHANGED   Details  aspirin EC 81 MG tablet Take 1 tablet (81 mg total) by mouth  daily. For heart protection    clopidogrel (PLAVIX) 75 MG tablet Take 1 tablet (75 mg total) by mouth daily. For heart protection    levETIRAcetam (KEPPRA) 500 MG tablet Take 1 tablet (500 mg total) by mouth 2 (two) times daily. Qty: 60 tablet, Refills: 0    levothyroxine (SYNTHROID, LEVOTHROID) 50 MCG tablet Take 50 mcg by mouth daily. For hypothyroidism    loperamide (IMODIUM) 2 MG capsule Take 2-4 mg by mouth as needed for diarrhea or loose stools.     Maltodextrin-Xanthan Gum (RESOURCE THICKENUP CLEAR) POWD Use as per product instructions to thicken liquids to nectar thick consistency. Qty: 1 Can, Refills: 0    midodrine (PROAMATINE) 5 MG tablet Take 1 tablet (5 mg total) by mouth 3 (three) times daily with meals. Qty: 90 tablet, Refills: 0    multivitamin (RENA-VIT) TABS tablet Take 1 tablet by mouth at bedtime. Qty: 30 tablet, Refills: 0    ondansetron (ZOFRAN ODT) 4 MG disintegrating tablet Take 1 tablet (4 mg total) by mouth every 8 (eight) hours as needed for nausea or vomiting. Qty: 20 tablet, Refills: 0    oxyCODONE-acetaminophen (PERCOCET/ROXICET) 5-325 MG tablet Take 1 tablet by mouth daily as needed for moderate pain or severe pain.    pantoprazole (PROTONIX) 40 MG tablet Take 1 tablet (40 mg total) by mouth 2 (two) times daily before a meal. Qty: 60 tablet, Refills: 0    potassium chloride SA (K-DUR,KLOR-CON) 20 MEQ tablet Take 20 mEq by mouth daily.     ranitidine (ZANTAC) 150 MG tablet Take 150 mg by mouth daily as needed for heartburn.    simvastatin (ZOCOR) 20 MG tablet Take 20 mg by mouth at bedtime.     Vitamin D, Ergocalciferol, (DRISDOL) 50000 units CAPS capsule Take 50,000 Units by mouth every 30 (thirty) days. On or about the 1st of each month    Zinc Oxide 13 % CREA Apply 1 application topically daily as needed (for barrier cream on buttocks while toileting).         DISCHARGE INSTRUCTIONS:  Continue peritoneal dialysis for end-stage renal disease Continue pace program Activity patient is wheelchair bound and uses walker as needed Diet renal Follow-up with nephrology in a week Follow-up with primary cardiologist Dr. Humphrey Rolls in a week Follow-up with primary care physician at pace program in 5 days    DIET:  Renal diet  DISCHARGE CONDITION:  Fair  ACTIVITY:  Activity as tolerated,patient is wheelchair bound and uses walker as needed Continue pace program  OXYGEN:  Home Oxygen: No.    Oxygen Delivery: room air  DISCHARGE LOCATION:  home   If you experience worsening of your admission symptoms, develop shortness of breath, life threatening emergency, suicidal or homicidal thoughts you must seek medical attention immediately by calling 911 or calling your MD immediately  if symptoms less severe.  You Must read complete instructions/literature along with all the possible adverse reactions/side effects for all the Medicines you take and that have been prescribed to you. Take any new Medicines after you have completely understood and accpet all the possible adverse reactions/side effects.   Please note  You were cared for by a hospitalist during your hospital stay. If you have any questions about your discharge medications or the care you received while you were in the hospital after you are discharged, you can call the unit and asked to speak with the hospitalist on call if the hospitalist that took care of you is not available. Once you  are discharged, your primary care physician will handle any further medical issues. Please note that NO REFILLS for any discharge medications will be authorized once you are discharged, as it is imperative that you return to your primary care physician (or establish a relationship with a primary care physician if you do not have one) for your aftercare needs so that they can reassess your need for medications and monitor your lab values.     Today  Chief Complaint  Patient presents with  . Cough   Patient is doing fine today. Denies any chest pain or shortness of breath. Wants to go home. Seen by cardiology and nephrology and okay to discharge from this standpoint. Discussed with sister [name] over phone and the friend at bedside regarding the discharge process. They're agreeable  ROS: CONSTITUTIONAL: Denies fevers, chills. Denies any fatigue, weakness.  EYES: Denies blurry vision, double vision, eye pain. EARS, NOSE, THROAT: Denies  tinnitus, ear pain, hearing loss. RESPIRATORY: Denies cough, wheeze, shortness of breath.  CARDIOVASCULAR: Denies chest pain, palpitations, edema.  GASTROINTESTINAL: Denies nausea, vomiting, diarrhea, abdominal pain. Denies bright red blood per rectum. GENITOURINARY: Denies dysuria, hematuria. ENDOCRINE: Denies nocturia or thyroid problems. HEMATOLOGIC AND LYMPHATIC: Denies easy bruising or bleeding. SKIN: Sacral stage I decubitus ulcer Denies rash or lesion. MUSCULOSKELETAL: Denies pain in neck, back, shoulder, knees, hips or arthritic symptoms.  NEUROLOGIC: Denies paralysis, paresthesias.  PSYCHIATRIC: Denies anxiety or depressive symptoms.   VITAL SIGNS:  Blood pressure 101/62, pulse 90, temperature 98.6 F (37 C), resp. rate 16, height '5\' 5"'$  (1.651 m), weight 57.1 kg (125 lb 12.8 oz), SpO2 96 %.  I/O:    Intake/Output Summary (Last 24 hours) at 01/19/16 1136 Last data filed at 01/19/16 0915  Gross per 24 hour  Intake              300 ml  Output                0 ml  Net              300 ml    PHYSICAL EXAMINATION:  GENERAL:  60 y.o.-year-old patient lying in the bed with no acute distress.  EYES: Pupils equal, round, reactive to light and accommodation. No scleral icterus. Extraocular muscles intact.  HEENT: Head atraumatic, normocephalic. Oropharynx and nasopharynx clear.  NECK:  Supple, no jugular venous distention. No thyroid enlargement, no tenderness.  LUNGS: Moderate breath sounds bilaterally, no wheezing, rales,rhonchi or crepitation. No use of accessory muscles of respiration.  CARDIOVASCULAR: S1, S2 normal. No murmurs, rubs, or gallops.  ABDOMEN: Soft, non-tender, non-distended. Bowel sounds present. No organomegaly or mass.  EXTREMITIES:Wheelchair bound No pedal edema, cyanosis, or clubbing.  NEUROLOGIC: Cranial nerves II through XII are intact. Muscle strength 5/5 in all extremities. Sensation intact. Gait not checked.  PSYCHIATRIC: The patient is alert and  oriented x 3.  SKIN: No obvious rash, has multiple abrasions on her extremities healing well  DATA REVIEW:   CBC  Recent Labs Lab 01/19/16 0622  WBC 9.5  HGB 10.4*  HCT 30.6*  PLT 373    Chemistries   Recent Labs Lab 01/18/16 0117  01/19/16 0622  NA 134*  < > 135  K 4.6  < > 4.5  CL 105  < > 106  CO2 18*  < > 19*  GLUCOSE 145*  < > 130*  BUN 36*  < > 35*  CREATININE 5.95*  < > 6.02*  CALCIUM 8.4*  < > 8.0*  AST 31  --   --   ALT 15  --   --   ALKPHOS 166*  --   --   BILITOT 0.2*  --   --   < > = values in this interval not displayed.  Cardiac Enzymes  Recent Labs Lab 01/18/16 1902  TROPONINI 0.18*    Microbiology Results  Results for orders placed or performed during the hospital encounter of 01/18/16  MRSA PCR Screening     Status: Abnormal   Collection Time: 01/18/16  3:30 PM  Result Value Ref Range Status   MRSA by PCR POSITIVE (A) NEGATIVE Final    Comment:        The GeneXpert MRSA Assay (FDA approved for NASAL specimens only), is one component of a comprehensive MRSA colonization surveillance program. It is not intended to diagnose MRSA infection nor to guide or monitor treatment for MRSA infections. RESULT CALLED TO, READ BACK BY AND VERIFIED WITH: SUSAN PRESTO AT 1818 01/18/16.PMH     RADIOLOGY:  Dg Chest Portable 1 View  Result Date: 01/18/2016 CLINICAL DATA:  Cough and gagging.   Nonproductive cough for 1 day. EXAM: PORTABLE CHEST 1 VIEW COMPARISON:  Radiographs and CT 12/02/2015 FINDINGS: Unchanged right lower lobe opacity with right pleural effusion and adjacent airspace disease. This is unchanged from prior radiographs and CT. Crowding of bronchovascular structures diffusely secondary to lower lung volumes, probable vascular congestion. No focal opacity in the left lung. The heart and mediastinal contours are unchanged. No pneumothorax. Unchanged osseous structures with chronic degenerative change of the right shoulder. IMPRESSION: Chronic  right lung base opacity, pleural effusion and rounded opacity on prior CTs. This is unchanged from priors. Probable vascular congestion. There is bronchovascular crowding secondary to lower lung volumes. Electronically Signed   By: Jeb Levering M.D.   On: 01/18/2016 01:55    EKG:   Orders placed or performed during the hospital encounter of 01/18/16  . EKG 12-Lead  . EKG 12-Lead      Management plans discussed with the patient, Sister [name] and friend at bedside  and they are in agreement.  CODE STATUS:     Code Status Orders        Start     Ordered   01/18/16 0519  Full code  Continuous     01/18/16 0518    Code Status History    Date Active Date Inactive Code Status Order ID Comments User Context   01/18/2016  5:18 AM 01/18/2016  7:00 AM Full Code [id number]  Saundra Shelling, MD Inpatient   12/02/2015  3:15 PM 12/03/2015  9:02 PM Full Code [id number]  Vaughan Basta, MD Inpatient   11/08/2015 10:33 PM 11/21/2015  5:53 PM Full Code [id number]  Norval Morton, MD Inpatient   08/17/2015  8:07 AM 08/18/2015  6:39 PM Full Code [id number]  Epifanio Lesches, MD ED   05/24/2015  3:42 AM 05/28/2015  5:34 PM Full Code [id number]  Saundra Shelling, MD Inpatient      TOTAL TIME TAKING CARE OF THIS PATIENT: 45  minutes.   Note: This dictation was prepared with Dragon dictation along with smaller phrase technology. Any transcriptional errors that result from this process are unintentional.   '@[facility]'$ @  on 01/19/2016 at 11:36 AM  Between 7am to 6pm - Pager - [phone]  After 6pm go to [email or web address] - password [email or web address] [facility]  Office  [phone]  CC: Primary care physician; [facility]

## 2016-01-19 NOTE — Progress Notes (Signed)
Patient reporting nausea this morning. RN witnessed patient sticking fingers in back of throat, and when asked, pt reported she was trying to make herself throw up. RN told patient about nausea medication, pt agreed to this and was given per protocol and pt preference. Pt educated and asked to stop trying to make herself throw up as it is not helpful for her recovery. Pt. Makes no indication of acknowledging or agreeing to stop despite several attempts by RN. Pt also asked to please refrain from yelling out into hallway for help, family, etc as this is disruptive to patients. Patients call bell, room phone, TB remote all placed within reach, pt reports she has no further needs. Will continue to monitor.

## 2016-01-19 NOTE — Consult Note (Signed)
[facility] Cardiology Consultation Note  Patient ID: Sherri Davidson, MRN: [id number], DOB/AGE: [birth year] 60 y.o. Admit date: 01/18/2016   Date of Consult: 01/19/2016 Primary Physician: [facility] Primary Cardiologist: None  Chief Complaint:  Chief Complaint  Patient presents with  . Cough   Reason for Consult: elevated troponin with weakness and fatigue  HPI: 60 y.o. female with known lung cancer diabetes with complications previous peripheral vascular disease with cerebrovascular accident having some on chronic diastolic dysfunction heart failure as well as some weakness and fatigue and increasing in frequency and intensity over the last several weeks but culminating in certain issues in the last several days. The patient was seen in the emergency room with some shortness of breath weakness and fatigue and had an EKG showing normal sinus rhythm with nonspecific ST changes. The patient also had an elevated troponin of 0.18 of unknown etiology more consistent with demand ischemia rather than acute coronary syndrome. The patient now feels somewhat weak and fatigue and still does not feel so perfect but there is no significant new chest pain or heart failure type symptoms. She continues on dialysis which is working relatively well. Additionally the patient has had antibiotics for further risk reduction for infection and is stable  Past Medical History:  Diagnosis Date  . Blood clot in vein right hand  . Cancer ([place])    lung cancer  . CHF (congestive heart failure) ([place])   . CVA, old, hemiparesis ([place])   . Diabetes ([place])   . ESRD on peritoneal dialysis ([place])   . Hypertension   . Hypothyroidism   . Seizures ([place])   . Stroke [facility])       Surgical History:  Past Surgical History:  Procedure Laterality Date  . AMPUTATION TOE    . CHOLECYSTECTOMY    . INSERTION OF DIALYSIS CATHETER       Home Meds: Prior to Admission medications   Medication Sig Start Date  End Date Taking? Authorizing Provider  aspirin EC 81 MG tablet Take 1 tablet (81 mg total) by mouth daily. For heart protection 11/28/15  Yes Cherene Altes, MD  clopidogrel (PLAVIX) 75 MG tablet Take 1 tablet (75 mg total) by mouth daily. For heart protection 11/28/15  Yes Cherene Altes, MD  levETIRAcetam (KEPPRA) 500 MG tablet Take 1 tablet (500 mg total) by mouth 2 (two) times daily. Patient taking differently: Take 500 mg by mouth 2 (two) times daily. For seizure prevention 05/28/15  Yes Vaughan Basta, MD  levothyroxine (SYNTHROID, LEVOTHROID) 50 MCG tablet Take 50 mcg by mouth daily. For hypothyroidism   Yes Historical Provider, MD  loperamide (IMODIUM) 2 MG capsule Take 2-4 mg by mouth as needed for diarrhea or loose stools.   Yes Historical Provider, MD  Maltodextrin-Xanthan Gum ([place]) POWD Use as per product instructions to thicken liquids to nectar thick consistency. 11/21/15  Yes Cherene Altes, MD  midodrine (PROAMATINE) 5 MG tablet Take 1 tablet (5 mg total) by mouth 3 (three) times daily with meals. Patient taking differently: Take 5 mg by mouth 2 (two) times daily with a meal.  11/21/15  Yes Cherene Altes, MD  multivitamin (RENA-VIT) TABS tablet Take 1 tablet by mouth at bedtime. Patient taking differently: Take 1 tablet by mouth daily.  11/21/15  Yes Cherene Altes, MD  ondansetron (ZOFRAN ODT) 4 MG disintegrating tablet Take 1 tablet (4 mg total) by mouth every 8 (eight) hours as needed for nausea or  vomiting. 12/03/15  Yes Hillary Bow, MD  oxyCODONE-acetaminophen (PERCOCET/ROXICET) 5-325 MG tablet Take 1 tablet by mouth daily as needed for moderate pain or severe pain.   Yes Historical Provider, MD  pantoprazole (PROTONIX) 40 MG tablet Take 1 tablet (40 mg total) by mouth 2 (two) times daily before a meal. 12/03/15  Yes Srikar Sudini, MD  potassium chloride SA (K-DUR,KLOR-CON) 20 MEQ tablet Take 20 mEq by mouth daily.    Yes Historical Provider, MD   ranitidine (ZANTAC) 150 MG tablet Take 150 mg by mouth daily as needed for heartburn.   Yes Historical Provider, MD  simvastatin (ZOCOR) 20 MG tablet Take 20 mg by mouth at bedtime.    Yes Historical Provider, MD  Vitamin D, Ergocalciferol, (DRISDOL) 50000 units CAPS capsule Take 50,000 Units by mouth every 30 (thirty) days. On or about the 1st of each month   Yes Historical Provider, MD  Zinc Oxide 13 % CREA Apply 1 application topically daily as needed (for barrier cream on buttocks while toileting).   Yes Historical Provider, MD    Inpatient Medications:  . aspirin EC  81 mg Oral Daily  . azithromycin  500 mg Intravenous Q24H  . cefTRIAXone (ROCEPHIN)  IV  1 g Intravenous Q24H  . Chlorhexidine Gluconate Cloth  6 each Topical Q0600  . clopidogrel  75 mg Oral Daily  . dialysis solution 1.5% low-MG/low-CA   Intraperitoneal Q24H  . heparin  5,000 Units Subcutaneous Q8H  . levETIRAcetam  500 mg Oral BID  . levothyroxine  50 mcg Oral QAC breakfast  . midodrine  5 mg Oral TID WC  . multivitamin  1 tablet Oral QHS  . mupirocin ointment  1 application Nasal BID  . nicotine  7 mg Transdermal Daily  . pantoprazole  40 mg Oral BID AC  . simvastatin  20 mg Oral QHS  . sodium chloride flush  3 mL Intravenous Q12H  . sodium chloride flush  3 mL Intravenous Q12H  . [START ON 02/13/2016] Vitamin D (Ergocalciferol)  50,000 Units Oral Q30 days      Allergies:  Allergies  Allergen Reactions  . Gabapentin Anaphylaxis and Other (See Comments)    Pt states she cant breathe  . Adhesive [Tape] Hives    Please use paper tape  . Salicylates Hives and Nausea Only    Uncoated.  Diona Fanti [Aspirin] Other (See Comments)    GI upset for non-enteric coated aspirin    Social History   Social History  . Marital status: Single    Spouse name: N/A  . Number of children: N/A  . Years of education: N/A   Occupational History  . disabled    Social History Main Topics  . Smoking status: Current Every Day  Smoker    Packs/day: 0.25    Years: 30.00    Types: Cigarettes  . Smokeless tobacco: Never Used  . Alcohol use No  . Drug use: No  . Sexual activity: Not on file   Other Topics Concern  . Not on file   Social History Narrative  . No narrative on file     Family History  Problem Relation Age of Onset  . CVA Mother   . CVA Father   . Heart failure Sister      Review of Systems Positive for Weakness fatigue and nausea Negative for: General:  chills, fever, night sweats or weight changes.  Cardiovascular: PND orthopnea syncope dizziness  Dermatological skin lesions rashes Respiratory: Cough congestion Urologic:  Frequent urination urination at night and hematuria Abdominal: negative for  vomiting, diarrhea, bright red blood per rectum, melena, or hematemesis Neurologic: negative for visual changes, and/or hearing changes  All other systems reviewed and are otherwise negative except as noted above.  Labs:  Recent Labs  01/18/16 0117 01/18/16 0718 01/18/16 1310 01/18/16 1902  TROPONINI 0.09* 0.23* 0.18* 0.18*   Lab Results  Component Value Date   WBC 9.5 01/19/2016   HGB 10.4 (L) 01/19/2016   HCT 30.6 (L) 01/19/2016   MCV 86.1 01/19/2016   PLT 373 01/19/2016    Recent Labs Lab 01/18/16 0117  01/19/16 0622  NA 134*  < > 135  K 4.6  < > 4.5  CL 105  < > 106  CO2 18*  < > 19*  BUN 36*  < > 35*  CREATININE 5.95*  < > 6.02*  CALCIUM 8.4*  < > 8.0*  PROT 6.9  --   --   BILITOT 0.2*  --   --   ALKPHOS 166*  --   --   ALT 15  --   --   AST 31  --   --   GLUCOSE 145*  < > 130*  < > = values in this interval not displayed. Lab Results  Component Value Date   CHOL 119 11/12/2015   HDL 60 11/12/2015   LDLCALC 42 11/12/2015   TRIG 85 11/12/2015   No results found for: DDIMER  Radiology/Studies:  Dg Esophagus  Result Date: 01/06/2016 CLINICAL DATA:  Dysphagia . EXAM: ESOPHOGRAM/BARIUM SWALLOW TECHNIQUE: Single contrast examination was performed using   thin barium. FLUOROSCOPY TIME:  Radiation Exposure Index (as provided by the fluoroscopic device): 8.4 mGy Scratch COMPARISON:  CT 12/02/2015. FINDINGS: Cervical and thoracic esophagus are widely patent. No obstructing abnormality identified. No evidence of esophagitis. No evidence of hiatal hernia or reflux. Barium tablet not administered due to the patient's clinical condition. IMPRESSION: Normal exam. No evidence of esophagitis or focal esophageal obstruction. No hiatal hernia or reflux. Electronically Signed   By: Marcello Moores  Register   On: 01/06/2016 10:17  Dg Chest Portable 1 View  Result Date: 01/18/2016 CLINICAL DATA:  Cough and gagging.   Nonproductive cough for 1 day. EXAM: PORTABLE CHEST 1 VIEW COMPARISON:  Radiographs and CT 12/02/2015 FINDINGS: Unchanged right lower lobe opacity with right pleural effusion and adjacent airspace disease. This is unchanged from prior radiographs and CT. Crowding of bronchovascular structures diffusely secondary to lower lung volumes, probable vascular congestion. No focal opacity in the left lung. The heart and mediastinal contours are unchanged. No pneumothorax. Unchanged osseous structures with chronic degenerative change of the right shoulder. IMPRESSION: Chronic right lung base opacity, pleural effusion and rounded opacity on prior CTs. This is unchanged from priors. Probable vascular congestion. There is bronchovascular crowding secondary to lower lung volumes. Electronically Signed   By: Jeb Levering M.D.   On: 01/18/2016 01:55    EKG: Normal sinus rhythm with nonspecific ST and T-wave changes  Weights: Filed Weights   01/18/16 0108 01/18/16 0520 01/19/16 0428  Weight: 162 lb (73.5 kg) 125 lb 4.8 oz (56.8 kg) 125 lb 12.8 oz (57.1 kg)     Physical Exam: Blood pressure 101/62, pulse 90, temperature 98.6 F (37 C), resp. rate 16, height '5\' 5"'$  (1.651 m), weight 125 lb 12.8 oz (57.1 kg), SpO2 96 %. Body mass index is 20.93 kg/m. General: Well  developed, well nourished, in no acute distress. Head eyes ears nose throat:  Normocephalic, atraumatic, sclera non-icteric, no xanthomas, nares are without discharge. No apparent thyromegaly and/or mass  Lungs: Normal respiratory effort.  Diffuse wheezes, no rales, some rhonchi.  Heart: RRR with normal S1 S2. no murmur gallop, no rub, PMI is normal size and placement, carotid upstroke normal without bruit, jugular venous pressure is normal Abdomen: Soft, non-tender, non-distended with normoactive bowel sounds. No hepatomegaly. No rebound/guarding. No obvious abdominal masses. Abdominal aorta is normal size without bruit Extremities: No edema. no cyanosis, no clubbing, no ulcers  Peripheral : 2+ bilateral upper extremity pulses, 2+ bilateral femoral pulses, 2+ bilateral dorsal pedal pulse Neuro: Alert and oriented. No facial asymmetry. No focal deficit. Moves all extremities spontaneously. Musculoskeletal: Normal muscle tone without kyphosis Psych:  Responds to questions appropriately with a normal affect.    Assessment: 59 year old female with acute on chronic diastolic dysfunction heart failure diabetes with complication previous cerebrovascular accident without residual issues with the stage IV glomerular filtration rate of 7 chronic kidney disease with nonspecific symptoms and an elevated troponin 0.18 of unknown etiology more consistent with demand ischemia at this time  Plan: 1. No further cardiac intervention at this time due to no evidence of chest pain or other significant symptoms requiring further intervention 2. Continue supportive care of the concerns of chronic kidney disease and dialysis 3. Further investigation of possible infection causing above issues 4. Begin ambulation and follow for worsening symptoms would consider the possibility of stress test if necessary if the patient continues to have the symptoms although would also consider further cardiac diagnostics of symptoms more  clear cardiovascular causes  Signed, Corey Skains M.D. [facility] 01/19/2016, 8:25 AM

## 2016-01-19 NOTE — ED Notes (Signed)
Patient transported to CT 

## 2016-01-19 NOTE — Progress Notes (Signed)
Subjective:  Patient states she is doing well Ready to go home Denies any nausea or vomiting   Objective:  Vital signs in last 24 hours:  Temp:  [98 F (36.7 C)-98.6 F (37 C)] 98 F (36.7 C) (08/07 1214) Pulse Rate:  [82-96] 96 (08/07 1214) Resp:  [16] 16 (08/07 1214) BP: (101-157)/(62-91) 136/91 (08/07 1214) SpO2:  [96 %-97 %] 97 % (08/07 1214) Weight:  [57.1 kg (125 lb 12.8 oz)] 57.1 kg (125 lb 12.8 oz) (08/07 0428)  Weight change: -16.42 kg (-36 lb 3.2 oz) Filed Weights   01/18/16 0108 01/18/16 0520 01/19/16 0428  Weight: 73.5 kg (162 lb) 56.8 kg (125 lb 4.8 oz) 57.1 kg (125 lb 12.8 oz)    Intake/Output:    Intake/Output Summary (Last 24 hours) at 01/19/16 1226 Last data filed at 01/19/16 0936  Gross per 24 hour  Intake              300 ml  Output              167 ml  Net              133 ml     Physical Exam: General: No acute distress, sitting up in bed   HEENT Anicteric   Neck Supple   Pulm/lungs Coarse breath sounds, normal effort   CVS/Heart irregular rhythm   Abdomen:  Soft, nontender   Extremities: No peripheral edema   Neurologic: Alert, oriented   Skin: Multiple excoriations   Access: PD catheter        Basic Metabolic Panel:   Recent Labs Lab 01/18/16 0117 01/18/16 0718 01/19/16 0622  NA 134* 134* 135  K 4.6 5.5* 4.5  CL 105 107 106  CO2 18* 16* 19*  GLUCOSE 145* 117* 130*  BUN 36* 37* 35*  CREATININE 5.95* 6.32* 6.02*  CALCIUM 8.4* 8.4* 8.0*     CBC:  Recent Labs Lab 01/18/16 0117 01/18/16 0718 01/19/16 0622  WBC 15.5* 14.6* 9.5  HGB 12.0 11.2* 10.4*  HCT 36.0 33.8* 30.6*  MCV 87.3 87.1 86.1  PLT 417 431 373      Microbiology:  Recent Results (from the past 720 hour(s))  MRSA PCR Screening     Status: Abnormal   Collection Time: 01/18/16  3:30 PM  Result Value Ref Range Status   MRSA by PCR POSITIVE (A) NEGATIVE Final    Comment:        The GeneXpert MRSA Assay (FDA approved for NASAL specimens only), is  one component of a comprehensive MRSA colonization surveillance program. It is not intended to diagnose MRSA infection nor to guide or monitor treatment for MRSA infections. RESULT CALLED TO, READ BACK BY AND VERIFIED WITH: SUSAN PRESTO AT 1818 01/18/16.PMH     Coagulation Studies: No results for input(s): LABPROT, INR in the last 72 hours.  Urinalysis: No results for input(s): COLORURINE, LABSPEC, PHURINE, GLUCOSEU, HGBUR, BILIRUBINUR, KETONESUR, PROTEINUR, UROBILINOGEN, NITRITE, LEUKOCYTESUR in the last 72 hours.  Invalid input(s): APPERANCEUR    Imaging: Dg Chest Portable 1 View  Result Date: 01/18/2016 CLINICAL DATA:  Cough and gagging.   Nonproductive cough for 1 day. EXAM: PORTABLE CHEST 1 VIEW COMPARISON:  Radiographs and CT 12/02/2015 FINDINGS: Unchanged right lower lobe opacity with right pleural effusion and adjacent airspace disease. This is unchanged from prior radiographs and CT. Crowding of bronchovascular structures diffusely secondary to lower lung volumes, probable vascular congestion. No focal opacity in the left lung. The heart and mediastinal contours  are unchanged. No pneumothorax. Unchanged osseous structures with chronic degenerative change of the right shoulder. IMPRESSION: Chronic right lung base opacity, pleural effusion and rounded opacity on prior CTs. This is unchanged from priors. Probable vascular congestion. There is bronchovascular crowding secondary to lower lung volumes. Electronically Signed   By: Jeb Levering M.D.   On: 01/18/2016 01:55     Medications:     . aspirin EC  81 mg Oral Daily  . azithromycin  500 mg Intravenous Q24H  . cefTRIAXone (ROCEPHIN)  IV  1 g Intravenous Q24H  . Chlorhexidine Gluconate Cloth  6 each Topical Q0600  . clopidogrel  75 mg Oral Daily  . dialysis solution 1.5% low-MG/low-CA   Intraperitoneal Q24H  . heparin  5,000 Units Subcutaneous Q8H  . levETIRAcetam  500 mg Oral BID  . levothyroxine  50 mcg Oral QAC  breakfast  . midodrine  5 mg Oral TID WC  . multivitamin  1 tablet Oral QHS  . mupirocin ointment  1 application Nasal BID  . nicotine  7 mg Transdermal Daily  . pantoprazole  40 mg Oral BID AC  . simvastatin  20 mg Oral QHS  . sodium chloride flush  3 mL Intravenous Q12H  . sodium chloride flush  3 mL Intravenous Q12H  . [START ON 02/13/2016] Vitamin D (Ergocalciferol)  50,000 Units Oral Q30 days   sodium chloride, acetaminophen **OR** acetaminophen, guaiFENesin, ondansetron **OR** ondansetron (ZOFRAN) IV, oxyCODONE-acetaminophen, senna-docusate, sodium chloride flush, zinc oxide  Assessment/ Plan:  60 y.o. female female with CVA, Hypotension, diabetes mellitus type II, congestive heart failure, seizure disorder, hypothyroidism, DVT, presents 6/20/2017for Esophagitis [K20.9]  [facility] [address] Peritoneal dialysis  Prescription: CCPD 8 hours with 6 exchanges 2 litre fills  1. ESRD with hyperkalemia: on peritoneal dialysis.   - doing well - resume home prescription after d/c  2. Anemia of chronic kidney disease: hemoglobin at goal  3. Hypotension:   - midodrine supplementation   4. Continued on treatment for Community acquired pneumonia    LOS: 0 Sherri Davidson 8/7/201712:26 PM

## 2016-01-19 NOTE — ED Provider Notes (Signed)
[facility] Emergency Department Provider Note  ____________________________________________   First MD Initiated Contact with Patient 01/19/16 2328     (approximate)  I have reviewed the triage vital signs and the nursing notes.   HISTORY  Chief Complaint Hemoptysis    HPI Pierce MEILI KLECKLEY is a 60 y.o. female with extensive chronic medical problems who is part of the [facility].  She was discharged from the hospital earlier today after admission for elevated troponin and community-acquired pneumonia.  She was evaluated by her nephrologist, cardiology, and discharged on Augmentin.   She was sent back to the emergency department this evening for evaluation of a small amount of hemoptysis.  Reportedly she coughed at home and she states that she coughed a small amount of blood into her hand.  She denies fever/chills, chest pain, shortness of breath, nausea, vomiting, abdominal pain, diarrhea.  She states that no point has she had any trouble breathing.  She has not coughed up any more blood.  She is in no distress and is comfortably watching TV.  She states that she did not want, but her sister, who is her caregiver, thought it would be a good idea.  She describes the onset of the hemoptysis as acute and mild in severity.  Nothing made it worse and it got better on its own.   Past Medical History:  Diagnosis Date  . Blood clot in vein right hand  . Cancer ([place])    lung cancer  . CHF (congestive heart failure) ([place])   . CVA, old, hemiparesis ([place])   . Diabetes ([place])   . ESRD on peritoneal dialysis ([place])   . Hypertension   . Hypothyroidism   . Seizures ([place])   . Stroke [facility])     Patient Active Problem List   Diagnosis Date Noted  . Elevated troponin 01/18/2016  . Pneumonia 01/18/2016  . Hematemesis 12/02/2015  . Multisystem organ failure   . Controlled diabetes mellitus type 2 with complications ([place])   . ESRD on peritoneal dialysis ([place])   .  Dysphagia   . Seizures ([place]) 11/08/2015  . Vomiting 08/17/2015  . Seizure ([place]) 05/24/2015  . Community acquired pneumonia 05/24/2015  . Pressure ulcer 05/24/2015    Past Surgical History:  Procedure Laterality Date  . AMPUTATION TOE    . CHOLECYSTECTOMY    . INSERTION OF DIALYSIS CATHETER      Prior to Admission medications   Medication Sig Start Date End Date Taking? Authorizing Provider  albuterol (PROVENTIL HFA;VENTOLIN HFA) 108 (90 Base) MCG/ACT inhaler Inhale 2 puffs into the lungs every 6 (six) hours as needed for wheezing or shortness of breath. 01/19/16   Nicholes Mango, MD  amoxicillin-clavulanate (AUGMENTIN) 875-125 MG tablet Take 1 tablet by mouth 2 (two) times daily. 01/19/16   Nicholes Mango, MD  aspirin EC 81 MG tablet Take 1 tablet (81 mg total) by mouth daily. For heart protection 11/28/15   Cherene Altes, MD  clopidogrel (PLAVIX) 75 MG tablet Take 1 tablet (75 mg total) by mouth daily. For heart protection 11/28/15   Cherene Altes, MD  guaiFENesin (ROBITUSSIN) 100 MG/5ML SOLN Take 5 mLs (100 mg total) by mouth every 6 (six) hours as needed for cough or to loosen phlegm. 01/19/16   Nicholes Mango, MD  levETIRAcetam (KEPPRA) 500 MG tablet Take 1 tablet (500 mg total) by mouth 2 (two) times daily. Patient taking differently: Take 500 mg by mouth 2 (two) times daily. For seizure prevention  05/28/15   Vaughan Basta, MD  levothyroxine (SYNTHROID, LEVOTHROID) 50 MCG tablet Take 50 mcg by mouth daily. For hypothyroidism    Historical Provider, MD  loperamide (IMODIUM) 2 MG capsule Take 2-4 mg by mouth as needed for diarrhea or loose stools.    Historical Provider, MD  Maltodextrin-Xanthan Gum ([place]) POWD Use as per product instructions to thicken liquids to nectar thick consistency. 11/21/15   Cherene Altes, MD  midodrine (PROAMATINE) 5 MG tablet Take 1 tablet (5 mg total) by mouth 3 (three) times daily with meals. Patient taking differently: Take 5 mg by  mouth 2 (two) times daily with a meal.  11/21/15   Cherene Altes, MD  multivitamin (RENA-VIT) TABS tablet Take 1 tablet by mouth at bedtime. Patient taking differently: Take 1 tablet by mouth daily.  11/21/15   Cherene Altes, MD  mupirocin ointment (BACTROBAN) 2 % Place 1 application into the nose 2 (two) times daily. 01/19/16 01/24/16  Nicholes Mango, MD  nicotine (NICODERM CQ - DOSED IN MG/24 HR) 7 mg/24hr patch Place 1 patch (7 mg total) onto the skin daily. 01/19/16   Nicholes Mango, MD  ondansetron (ZOFRAN ODT) 4 MG disintegrating tablet Take 1 tablet (4 mg total) by mouth every 8 (eight) hours as needed for nausea or vomiting. 12/03/15   Hillary Bow, MD  oxyCODONE-acetaminophen (PERCOCET/ROXICET) 5-325 MG tablet Take 1 tablet by mouth daily as needed for moderate pain or severe pain.    Historical Provider, MD  pantoprazole (PROTONIX) 40 MG tablet Take 1 tablet (40 mg total) by mouth 2 (two) times daily before a meal. 12/03/15   Srikar Sudini, MD  potassium chloride SA (K-DUR,KLOR-CON) 20 MEQ tablet Take 20 mEq by mouth daily.     Historical Provider, MD  ranitidine (ZANTAC) 150 MG tablet Take 150 mg by mouth daily as needed for heartburn.    Historical Provider, MD  simvastatin (ZOCOR) 20 MG tablet Take 20 mg by mouth at bedtime.     Historical Provider, MD  Vitamin D, Ergocalciferol, (DRISDOL) 50000 units CAPS capsule Take 50,000 Units by mouth every 30 (thirty) days. On or about the 1st of each month    Historical Provider, MD  Zinc Oxide 13 % CREA Apply 1 application topically daily as needed (for barrier cream on buttocks while toileting).    Historical Provider, MD    Allergies Gabapentin; Adhesive [tape]; Salicylates; and Asa [aspirin]  Family History  Problem Relation Age of Onset  . CVA Mother   . CVA Father   . Heart failure Sister     Social History Social History  Substance Use Topics  . Smoking status: Current Every Day Smoker    Packs/day: 0.25    Years: 30.00    Types:  Cigarettes  . Smokeless tobacco: Never Used  . Alcohol use No    Review of Systems Constitutional: No fever/chills Eyes: No visual changes. ENT: No sore throat. Cardiovascular: Denies chest pain. Respiratory: Denies shortness of breath. Small amount of hemoptysis Gastrointestinal: No abdominal pain.  No nausea, no vomiting.  No diarrhea.  No constipation. Genitourinary: Negative for dysuria. Musculoskeletal: Negative for back pain. Skin: Negative for rash. Neurological: Negative for headaches, focal weakness or numbness.  10-point ROS otherwise negative.  ____________________________________________   PHYSICAL EXAM:  VITAL SIGNS: ED Triage Vitals  Enc Vitals Group     BP 01/19/16 2233 134/83     Pulse Rate 01/19/16 2233 97     Resp 01/19/16 2233 18  Temp 01/19/16 2233 97.8 F (36.6 C)     Temp Source 01/19/16 2233 Oral     SpO2 01/19/16 2233 97 %     Weight 01/19/16 2233 125 lb (56.7 kg)     Height 01/19/16 2233 '5\' 6"'$  (1.676 m)     Head Circumference --      Peak Flow --      Pain Score 01/19/16 2234 0     Pain Loc --      Pain Edu? --      Excl. in [place]? --     Constitutional: Alert and oriented. Has the appearance of chronic illness but is in no acute distress Eyes: Conjunctivae are normal. PERRL. EOMI. Head: Atraumatic. Nose: No congestion/rhinnorhea. Mouth/Throat: Mucous membranes are moist.  Oropharynx non-erythematous. Neck: No stridor.  No meningeal signs.   Cardiovascular: Normal rate, regular rhythm. Good peripheral circulation. Grossly normal heart sounds.   Respiratory: Normal respiratory effort.  No retractions. Lungs CTAB. Gastrointestinal: Soft and nontender. No distention.  Peritoneal dialysis catheter is in place. Musculoskeletal: No lower extremity tenderness nor edema. No gross deformities of extremities. Neurologic:  Normal speech and language. No gross focal neurologic deficits are appreciated.  Skin:  Skin is warm, dry and intact. No rash  noted. Psychiatric: Mood and affect are normal. Speech and behavior are normal.  ____________________________________________   LABS (all labs ordered are listed, but only abnormal results are displayed)  Labs Reviewed  COMPREHENSIVE METABOLIC PANEL - Abnormal; Notable for the following:       Result Value   Potassium 5.3 (*)    CO2 19 (*)    Glucose, Bld 112 (*)    BUN 41 (*)    Creatinine, Ser 6.02 (*)    Calcium 8.2 (*)    Total Protein 6.3 (*)    Albumin 2.0 (*)    Total Bilirubin 0.2 (*)    GFR calc non Af Amer 7 (*)    GFR calc Af Amer 8 (*)    All other components within normal limits  CBC WITH DIFFERENTIAL/PLATELET - Abnormal; Notable for the following:    Hemoglobin 11.1 (*)    HCT 33.2 (*)    RDW 18.5 (*)    Neutro Abs 8.3 (*)    All other components within normal limits  CULTURE, BLOOD (ROUTINE X 2)  CULTURE, BLOOD (ROUTINE X 2)   ____________________________________________  EKG  None ____________________________________________  RADIOLOGY   Dg Chest 2 View  Result Date: 01/19/2016 CLINICAL DATA:  Hemoptysis. Discharged from hospital today after being treated for pneumonia. EXAM: CHEST  2 VIEW COMPARISON:  Portable chest radiograph yesterday. Most recent chest CT 12/02/2015 FINDINGS: Chronic right lung base opacity with pleural effusion. Lower lung volumes from prior exam. There is increased bronchial thickening and left basilar atelectasis. Possible small left pleural effusion. Heart size and mediastinal contours are unchanged. No pneumothorax. Diffuse bony under mineralization. IMPRESSION: Increased bronchial thickening and left basilar atelectasis. Possible small left pleural effusion. Chronic right lower lobe opacity and pleural effusion. Electronically Signed   By: Jeb Levering M.D.   On: 01/19/2016 23:25   Ct Chest Wo Contrast  Result Date: 01/20/2016 CLINICAL DATA:  Acute onset of hemoptysis. Recent diagnosis of pneumonia. Initial encounter. EXAM:  CT CHEST WITHOUT CONTRAST TECHNIQUE: Multidetector CT imaging of the chest was performed following the standard protocol without IV contrast. COMPARISON:  Chest radiograph performed earlier today at 11:09 p.m., and CT of the chest performed 12/02/2015 FINDINGS: Cardiovascular: Diffuse coronary artery  calcifications are seen. Scattered calcification is noted along the thoracic aorta and proximal great vessels, with likely severe luminal narrowing at the proximal left subclavian artery. Mediastinum/Nodes: Enlarged mediastinal nodes are seen, measuring up to 2.0 cm at the subcarinal region. A 1.1 cm node is noted at the right paratracheal region. No pericardial effusion is identified. Lungs/Pleura: Small right and moderate left pleural effusions are noted. There is mild peripheral wall thickening with regard to the right-sided pleural effusion, raising question for mild right-sided empyema. Would correlate for associated symptoms. Dense right lower lobe airspace opacification is noted, and patchy central left-sided airspace opacity may also reflect pneumonia. Upper Abdomen: The visualized portions of the liver and spleen are grossly unremarkable. The visualized portions of the pancreas and adrenal glands are within normal limits. The patient is status post cholecystectomy, with clips noted at the gallbladder fossa. Musculoskeletal: No acute osseous abnormalities are identified. Posterior calcified disc protrusions are noted at the mid to lower thoracic spine. The chest wall is grossly unremarkable in appearance. IMPRESSION: 1. Small right and moderate left pleural effusions, with mild peripheral wall thickening at the right-sided pleural effusion, raising question for mild right-sided empyema. Would correlate for associated symptoms. 2. Dense right lower lobe airspace opacification, and patchy central left-sided airspace opacity, concerning for multifocal pneumonia. 3. Enlarged mediastinal nodes, measuring up to 2.0 cm  at the subcarinal region. After completion of treatment for the patient's acute infection, follow-up CT could be considered to assess for improvement in lymphadenopathy. 4. Diffuse coronary artery calcifications seen. 5. Scattered calcification along the thoracic aorta and proximal great vessels, with likely severe luminal narrowing at the proximal left subclavian artery. Electronically Signed   By: Garald Balding M.D.   On: 01/20/2016 00:49    ____________________________________________   PROCEDURES  Procedure(s) performed:   Procedures   Critical Care performed: No ____________________________________________   INITIAL IMPRESSION / ASSESSMENT AND PLAN / ED COURSE  Pertinent labs & imaging results that were available during my care of the patient were reviewed by me and considered in my medical decision making (see chart for details).  Patient has had no chest pain and no shortness of breath and only a small amount of hemoptysis in the setting of being treated for community-acquired pneumonia.  Her 2 view chest x-ray suggested possibly some developing bronchitis but also showed some left lower lobe atelectasis and possible pleural effusion.  Given all of her comorbidities and the recent hospital course,  I will obtain a noncontrast CT scan of her chest to make sure there is no gross abnormality, but she is in no acute distress with normal vital signs and at no point has been hypoxemic.  Clinical Course  Value Comment By Time  CT Chest Wo Contrast The CT scan, when compared against her recent chest x-ray and the CT chest from less than 2 months ago, is concerning not only for multifocal pneumonia after her recent hospitalization but also for the possibility of a developing empyema.  I we will obtain standard blood work, blood cultures, and treat for hospital-acquired pneumonia and readmit her to the hospital for further management. Hinda Kehr, MD 08/08 0120   Labs not requiring  immediate intervention, discussed in person with hospitalist.  Receiving empiric abx. Hinda Kehr, MD 08/08 [phone]    ____________________________________________  FINAL CLINICAL IMPRESSION(S) / ED DIAGNOSES  Final diagnoses:  Healthcare-associated pneumonia  Hemoptysis  Peritoneal dialysis status ([place])     MEDICATIONS GIVEN DURING THIS VISIT:  Medications  ceFEPIme (  MAXIPIME) 1 g in dextrose 5 % 50 mL IVPB (not administered)  sodium chloride 0.9 % bolus 500 mL (not administered)     NEW OUTPATIENT MEDICATIONS STARTED DURING THIS VISIT:  New Prescriptions   No medications on file      Note:  This document was prepared using Dragon voice recognition software and may include unintentional dictation errors.    Hinda Kehr, MD 01/20/16 [phone]

## 2016-01-19 NOTE — Discharge Instructions (Signed)
Continue peritoneal dialysis for end-stage renal disease Continue [facility] program Activity patient is wheelchair bound and uses walker as needed Diet renal Follow-up with nephrology in a week Follow-up with primary cardiologist Dr. Humphrey Rolls in a week Follow-up with primary care physician at [facility] program in 5 days

## 2016-01-19 NOTE — Care Management (Signed)
Placed in observation for  elevated  troponin and cough.  Patient is followed by [facility] and home peritoneal dialysis.  Cardiology has cleared . Patient is for discharge today.  There is a care management consult for transportation needs.  [facility] will transport home.   Spoke with patient's [facility] provider regarding frequent presentations/ admissions

## 2016-01-19 NOTE — ED Triage Notes (Signed)
Patient discharged from [facility] today after being treated inpatient for pneumonia.  Patietn arrives from home this evening due to complaints of patient coughing up blood.  Patient is AAOx3.  Skin warm and dry.  No SOB/ DOE.  Respirations regular and non labored.  Denies pain.  Denies all complaint.

## 2016-01-19 NOTE — Progress Notes (Addendum)
Pt sister called for update, password provided. Updated and plan of care. Questions answered to family members reported satisfaction.

## 2016-01-19 NOTE — Progress Notes (Signed)
Pt. Discharged to home with [facility] via wc. Discharge instructions and medication regimen reviewed at bedside with patient and caregiver. Both verbalize understanding of instructions and medication regimen. Prescriptions included with d/c papers, [facility] RN aware. Patient assessment unchanged from this morning. TELE and IV discontinued per policy.

## 2016-01-19 NOTE — ED Notes (Signed)
Patient denies pain and is resting comfortably.  

## 2016-01-20 DIAGNOSIS — D631 Anemia in chronic kidney disease: Secondary | ICD-10-CM | POA: Diagnosis present

## 2016-01-20 DIAGNOSIS — Z789 Other specified health status: Secondary | ICD-10-CM | POA: Diagnosis not present

## 2016-01-20 DIAGNOSIS — J189 Pneumonia, unspecified organism: Secondary | ICD-10-CM | POA: Diagnosis present

## 2016-01-20 DIAGNOSIS — J440 Chronic obstructive pulmonary disease with acute lower respiratory infection: Secondary | ICD-10-CM | POA: Diagnosis present

## 2016-01-20 DIAGNOSIS — N186 End stage renal disease: Secondary | ICD-10-CM | POA: Diagnosis present

## 2016-01-20 DIAGNOSIS — J869 Pyothorax without fistula: Secondary | ICD-10-CM | POA: Diagnosis present

## 2016-01-20 DIAGNOSIS — J9621 Acute and chronic respiratory failure with hypoxia: Secondary | ICD-10-CM | POA: Diagnosis present

## 2016-01-20 DIAGNOSIS — R042 Hemoptysis: Secondary | ICD-10-CM | POA: Diagnosis present

## 2016-01-20 DIAGNOSIS — E039 Hypothyroidism, unspecified: Secondary | ICD-10-CM | POA: Diagnosis present

## 2016-01-20 DIAGNOSIS — R918 Other nonspecific abnormal finding of lung field: Secondary | ICD-10-CM | POA: Diagnosis not present

## 2016-01-20 DIAGNOSIS — C3431 Malignant neoplasm of lower lobe, right bronchus or lung: Secondary | ICD-10-CM | POA: Diagnosis present

## 2016-01-20 DIAGNOSIS — I5022 Chronic systolic (congestive) heart failure: Secondary | ICD-10-CM | POA: Diagnosis present

## 2016-01-20 DIAGNOSIS — Z992 Dependence on renal dialysis: Secondary | ICD-10-CM | POA: Diagnosis not present

## 2016-01-20 DIAGNOSIS — J9811 Atelectasis: Secondary | ICD-10-CM | POA: Diagnosis present

## 2016-01-20 DIAGNOSIS — J441 Chronic obstructive pulmonary disease with (acute) exacerbation: Secondary | ICD-10-CM | POA: Diagnosis present

## 2016-01-20 DIAGNOSIS — I69359 Hemiplegia and hemiparesis following cerebral infarction affecting unspecified side: Secondary | ICD-10-CM | POA: Diagnosis not present

## 2016-01-20 DIAGNOSIS — E875 Hyperkalemia: Secondary | ICD-10-CM | POA: Diagnosis present

## 2016-01-20 DIAGNOSIS — I959 Hypotension, unspecified: Secondary | ICD-10-CM | POA: Diagnosis present

## 2016-01-20 DIAGNOSIS — Y95 Nosocomial condition: Secondary | ICD-10-CM | POA: Diagnosis present

## 2016-01-20 DIAGNOSIS — E1122 Type 2 diabetes mellitus with diabetic chronic kidney disease: Secondary | ICD-10-CM | POA: Diagnosis present

## 2016-01-20 DIAGNOSIS — N2581 Secondary hyperparathyroidism of renal origin: Secondary | ICD-10-CM | POA: Diagnosis present

## 2016-01-20 DIAGNOSIS — Z7189 Other specified counseling: Secondary | ICD-10-CM | POA: Diagnosis not present

## 2016-01-20 DIAGNOSIS — I132 Hypertensive heart and chronic kidney disease with heart failure and with stage 5 chronic kidney disease, or end stage renal disease: Secondary | ICD-10-CM | POA: Diagnosis present

## 2016-01-20 LAB — COMPREHENSIVE METABOLIC PANEL
ALT: 14 U/L (ref 14–54)
AST: 27 U/L (ref 15–41)
Albumin: 2 g/dL — ABNORMAL LOW (ref 3.5–5.0)
Alkaline Phosphatase: 126 U/L (ref 38–126)
Anion gap: 12 (ref 5–15)
BILIRUBIN TOTAL: 0.2 mg/dL — AB (ref 0.3–1.2)
BUN: 41 mg/dL — AB (ref 6–20)
CHLORIDE: 105 mmol/L (ref 101–111)
CO2: 19 mmol/L — ABNORMAL LOW (ref 22–32)
CREATININE: 6.02 mg/dL — AB (ref 0.44–1.00)
Calcium: 8.2 mg/dL — ABNORMAL LOW (ref 8.9–10.3)
GFR calc Af Amer: 8 mL/min — ABNORMAL LOW (ref >60)
GFR, EST NON AFRICAN AMERICAN: 7 mL/min — AB (ref >60)
Glucose, Bld: 112 mg/dL — ABNORMAL HIGH (ref 65–99)
Potassium: 5.3 mmol/L — ABNORMAL HIGH (ref 3.5–5.1)
Sodium: 136 mmol/L (ref 135–145)
Total Protein: 6.3 g/dL — ABNORMAL LOW (ref 6.5–8.1)

## 2016-01-20 LAB — CBC WITH DIFFERENTIAL/PLATELET
Basophils Absolute: 0.1 10*3/uL (ref 0–0.1)
Basophils Relative: 1 %
EOS ABS: 0 10*3/uL (ref 0–0.7)
EOS PCT: 0 %
HCT: 33.2 % — ABNORMAL LOW (ref 35.0–47.0)
Hemoglobin: 11.1 g/dL — ABNORMAL LOW (ref 12.0–16.0)
LYMPHS ABS: 1.4 10*3/uL (ref 1.0–3.6)
Lymphocytes Relative: 13 %
MCH: 29.1 pg (ref 26.0–34.0)
MCHC: 33.5 g/dL (ref 32.0–36.0)
MCV: 86.8 fL (ref 80.0–100.0)
Monocytes Absolute: 0.9 10*3/uL (ref 0.2–0.9)
Monocytes Relative: 8 %
Neutro Abs: 8.3 10*3/uL — ABNORMAL HIGH (ref 1.4–6.5)
Neutrophils Relative %: 78 %
PLATELETS: 394 10*3/uL (ref 150–440)
RBC: 3.83 MIL/uL (ref 3.80–5.20)
RDW: 18.5 % — AB (ref 11.5–14.5)
WBC: 10.6 10*3/uL (ref 3.6–11.0)

## 2016-01-20 LAB — BASIC METABOLIC PANEL
ANION GAP: 12 (ref 5–15)
BUN: 41 mg/dL — ABNORMAL HIGH (ref 6–20)
CHLORIDE: 107 mmol/L (ref 101–111)
CO2: 18 mmol/L — AB (ref 22–32)
Calcium: 8.1 mg/dL — ABNORMAL LOW (ref 8.9–10.3)
Creatinine, Ser: 6.15 mg/dL — ABNORMAL HIGH (ref 0.44–1.00)
GFR calc non Af Amer: 7 mL/min — ABNORMAL LOW (ref >60)
GFR, EST AFRICAN AMERICAN: 8 mL/min — AB (ref >60)
Glucose, Bld: 129 mg/dL — ABNORMAL HIGH (ref 65–99)
Potassium: 4.9 mmol/L (ref 3.5–5.1)
Sodium: 137 mmol/L (ref 135–145)

## 2016-01-20 LAB — CBC
HEMATOCRIT: 32.9 % — AB (ref 35.0–47.0)
HEMOGLOBIN: 11.1 g/dL — AB (ref 12.0–16.0)
MCH: 29.5 pg (ref 26.0–34.0)
MCHC: 33.7 g/dL (ref 32.0–36.0)
MCV: 87.6 fL (ref 80.0–100.0)
Platelets: 476 10*3/uL — ABNORMAL HIGH (ref 150–440)
RBC: 3.75 MIL/uL — AB (ref 3.80–5.20)
RDW: 18 % — ABNORMAL HIGH (ref 11.5–14.5)
WBC: 11.7 10*3/uL — ABNORMAL HIGH (ref 3.6–11.0)

## 2016-01-20 MED ORDER — PANTOPRAZOLE SODIUM 40 MG PO TBEC
40.0000 mg | DELAYED_RELEASE_TABLET | Freq: Two times a day (BID) | ORAL | Status: DC
  Administered 2016-01-20 – 2016-01-22 (×6): 40 mg via ORAL
  Filled 2016-01-20 (×6): qty 1

## 2016-01-20 MED ORDER — AZITHROMYCIN 250 MG PO TABS
500.0000 mg | ORAL_TABLET | Freq: Every day | ORAL | Status: AC
  Administered 2016-01-20 – 2016-01-22 (×3): 500 mg via ORAL
  Filled 2016-01-20 (×3): qty 2

## 2016-01-20 MED ORDER — EPOETIN ALFA 10000 UNIT/ML IJ SOLN
10000.0000 [IU] | INTRAMUSCULAR | Status: DC
  Administered 2016-01-21: 10000 [IU] via SUBCUTANEOUS
  Filled 2016-01-20: qty 1

## 2016-01-20 MED ORDER — ACETAMINOPHEN 325 MG PO TABS
650.0000 mg | ORAL_TABLET | Freq: Four times a day (QID) | ORAL | Status: DC | PRN
Start: 2016-01-20 — End: 2016-01-22

## 2016-01-20 MED ORDER — OXYCODONE-ACETAMINOPHEN 5-325 MG PO TABS
1.0000 | ORAL_TABLET | Freq: Every day | ORAL | Status: DC | PRN
  Administered 2016-01-21: 1 via ORAL
  Filled 2016-01-20: qty 1

## 2016-01-20 MED ORDER — FAMOTIDINE 20 MG PO TABS
20.0000 mg | ORAL_TABLET | Freq: Every day | ORAL | Status: DC
  Administered 2016-01-21 – 2016-01-22 (×2): 20 mg via ORAL
  Filled 2016-01-20 (×2): qty 1

## 2016-01-20 MED ORDER — DEXTROSE 5 % IV SOLN: 2.0000 g | INTRAVENOUS | Status: DC

## 2016-01-20 MED ORDER — MIDODRINE HCL 5 MG PO TABS
5.0000 mg | ORAL_TABLET | Freq: Three times a day (TID) | ORAL | Status: DC
  Administered 2016-01-20 – 2016-01-22 (×8): 5 mg via ORAL
  Filled 2016-01-20 (×8): qty 1

## 2016-01-20 MED ORDER — GENTAMICIN SULFATE 0.1 % EX CREA
1.0000 "application " | TOPICAL_CREAM | Freq: Every day | CUTANEOUS | Status: DC
  Administered 2016-01-20 – 2016-01-21 (×2): 1 via TOPICAL
  Filled 2016-01-20 (×2): qty 15

## 2016-01-20 MED ORDER — NICOTINE 7 MG/24HR TD PT24
7.0000 mg | MEDICATED_PATCH | Freq: Every day | TRANSDERMAL | Status: DC
  Administered 2016-01-21 – 2016-01-22 (×2): 7 mg via TRANSDERMAL
  Filled 2016-01-20 (×3): qty 1

## 2016-01-20 MED ORDER — SIMVASTATIN 20 MG PO TABS
20.0000 mg | ORAL_TABLET | Freq: Every day | ORAL | Status: DC
  Administered 2016-01-20 – 2016-01-21 (×2): 20 mg via ORAL
  Filled 2016-01-20 (×2): qty 1

## 2016-01-20 MED ORDER — IPRATROPIUM-ALBUTEROL 0.5-2.5 (3) MG/3ML IN SOLN
3.0000 mL | RESPIRATORY_TRACT | Status: DC
  Administered 2016-01-20 – 2016-01-22 (×9): 3 mL via RESPIRATORY_TRACT
  Filled 2016-01-20 (×14): qty 3

## 2016-01-20 MED ORDER — DEXTROSE 5 % IV SOLN: 1.0000 g | Freq: Three times a day (TID) | INTRAVENOUS | Status: DC

## 2016-01-20 MED ORDER — DEXTROSE 5 % IV SOLN
1.0000 g | Freq: Once | INTRAVENOUS | Status: AC
  Administered 2016-01-20: 1 g via INTRAVENOUS
  Filled 2016-01-20: qty 1

## 2016-01-20 MED ORDER — ACETAMINOPHEN 650 MG RE SUPP: 650.0000 mg | Freq: Four times a day (QID) | RECTAL | Status: DC | PRN

## 2016-01-20 MED ORDER — ALBUTEROL SULFATE (2.5 MG/3ML) 0.083% IN NEBU: 3.0000 mL | INHALATION_SOLUTION | Freq: Four times a day (QID) | RESPIRATORY_TRACT | Status: DC | PRN

## 2016-01-20 MED ORDER — SODIUM CHLORIDE 0.9 % IV SOLN
INTRAVENOUS | Status: DC
  Administered 2016-01-20: 05:00:00 via INTRAVENOUS

## 2016-01-20 MED ORDER — VANCOMYCIN HCL 10 G IV SOLR
1500.0000 mg | Freq: Once | INTRAVENOUS | Status: AC
  Administered 2016-01-20: 1500 mg via INTRAVENOUS
  Filled 2016-01-20: qty 1500

## 2016-01-20 MED ORDER — LEVETIRACETAM 500 MG PO TABS
500.0000 mg | ORAL_TABLET | Freq: Two times a day (BID) | ORAL | Status: DC
  Administered 2016-01-20 – 2016-01-22 (×5): 500 mg via ORAL
  Filled 2016-01-20 (×5): qty 1

## 2016-01-20 MED ORDER — HEPARIN 1000 UNIT/ML FOR PERITONEAL DIALYSIS
500.0000 [IU] | INTRAMUSCULAR | Status: DC | PRN
  Filled 2016-01-20: qty 0.5

## 2016-01-20 MED ORDER — PIPERACILLIN-TAZOBACTAM 3.375 G IVPB
3.3750 g | Freq: Two times a day (BID) | INTRAVENOUS | Status: DC
  Administered 2016-01-22: 3.375 g via INTRAVENOUS
  Filled 2016-01-20 (×5): qty 50

## 2016-01-20 MED ORDER — DELFLEX-LC/1.5% DEXTROSE 346 MOSM/L IP SOLN
INTRAPERITONEAL | Status: DC
  Administered 2016-01-20: 19:00:00 via INTRAPERITONEAL
  Filled 2016-01-20 (×2): qty 3000

## 2016-01-20 MED ORDER — LEVOTHYROXINE SODIUM 25 MCG PO TABS
50.0000 ug | ORAL_TABLET | Freq: Every day | ORAL | Status: DC
  Administered 2016-01-20 – 2016-01-22 (×3): 50 ug via ORAL
  Filled 2016-01-20 (×3): qty 2

## 2016-01-20 MED ORDER — ZINC OXIDE 20 % EX OINT: 1.0000 "application " | TOPICAL_OINTMENT | Freq: Every day | CUTANEOUS | Status: DC | PRN

## 2016-01-20 MED ORDER — SODIUM CHLORIDE 0.9 % IV BOLUS (SEPSIS)
500.0000 mL | INTRAVENOUS | Status: AC
Start: 2016-01-20 — End: 2016-01-20
  Administered 2016-01-20: 500 mL via INTRAVENOUS

## 2016-01-20 MED ORDER — ONDANSETRON 4 MG PO TBDP: 4.0000 mg | ORAL_TABLET | Freq: Three times a day (TID) | ORAL | Status: DC | PRN

## 2016-01-20 MED ORDER — GUAIFENESIN 100 MG/5ML PO SOLN: 5.0000 mL | Freq: Four times a day (QID) | ORAL | Status: DC | PRN

## 2016-01-20 MED ORDER — MUPIROCIN 2 % EX OINT
1.0000 "application " | TOPICAL_OINTMENT | Freq: Two times a day (BID) | CUTANEOUS | Status: DC
  Administered 2016-01-20 – 2016-01-21 (×3): 1 via NASAL
  Filled 2016-01-20: qty 22

## 2016-01-20 MED ORDER — SENNOSIDES-DOCUSATE SODIUM 8.6-50 MG PO TABS: 1.0000 | ORAL_TABLET | Freq: Every evening | ORAL | Status: DC | PRN

## 2016-01-20 NOTE — Progress Notes (Signed)
Deneise Lever, NP with [facility] came by to check in on patient. She left her contact information. It is listed here if needed, office number [phone]. Her cell number is [phone].

## 2016-01-20 NOTE — Progress Notes (Signed)
Patient hollaring out; informed patient that she can not be hollaring out in the hospital, that it disturbs other patients; patient non-compliant. Up to chair upon insistance, refused SCDs; Barbaraann Faster, RN 5:52 AM 01/20/2016

## 2016-01-20 NOTE — ED Notes (Signed)
Pharmacy notified to send vancomycin to the floor for rm 210 - lab called to see if they could add on HIV antibody and they stated this was the wrong test and that a HIV screen needed to be ordered - will notify the floor - Marcella RN took report and is aware that strep pneumoniae urinary antigen needs to be collected

## 2016-01-20 NOTE — Progress Notes (Signed)
Patient continually yelling out, went in to assess patient and patient wanted to be repositioned in bed. Repositioned patient ad adjusted environment, patient alert and oriented x3, yet tries to gag herself, and would continue to yell out, even after redirection. Patient stated she could not breath, does have expiratory wheezes, will notify attending MD. Continue to assess.

## 2016-01-20 NOTE — ED Notes (Signed)
Pt urinated and defecated in bed - sheets and pt cleaned and changed

## 2016-01-20 NOTE — Progress Notes (Signed)
[facility] [facility] [place] NAME: Sherri Davidson    MRN#:  [id number]  DATE OF BIRTH:  04/25/1956  SUBJECTIVE:  Hospital Day: 0 days Sherri Davidson is a 60 y.o. female presenting with Hemoptysis .   Overnight events: No acute overnight events Interval Events: Called to bedside this morning given respiratory distress, patient unable to provide information given medical condition/mental status  REVIEW OF SYSTEMS:  Patient unable to provide information given medical condition/mental status DRUG ALLERGIES:   Allergies  Allergen Reactions  . Gabapentin Anaphylaxis and Other (See Comments)    Pt states she cant breathe  . Adhesive [Tape] Hives    Please use paper tape  . Salicylates Hives and Nausea Only    Uncoated.  Diona Fanti [Aspirin] Other (See Comments)    GI upset for non-enteric coated aspirin    VITALS:  Blood pressure 115/78, pulse 99, temperature 98 F (36.7 C), temperature source Oral, resp. rate (!) 24, height '5\' 6"'$  (1.676 m), weight 125 lb (56.7 kg), SpO2 93 %.  PHYSICAL EXAMINATION:   VITAL SIGNS: Vitals:   01/20/16 0918 01/20/16 1208  BP:  115/78  Pulse: 99 99  Resp:    Temp:     GENERAL:59 y.o.female acute respiratory distress HEAD: Normocephalic, atraumatic.  EYES: Pupils equal, round, reactive to light. Unable to assess extraocular muscles given mental status/medical condition. No scleral icterus.  MOUTH: Moist mucosal membrane. Dentition intact. No abscess noted.  EAR, NOSE, THROAT: Clear without exudates. No external lesions.  NECK: Supple. No thyromegaly. No nodules. No JVD.  PULMONARY: Diminished breath sounds at bases with associated rhonchi scant without wheeze tachypnea. No use of accessory muscles, Good respiratory effort. good air entry bilaterally CHEST: Nontender to palpation.  CARDIOVASCULAR: S1 and S2. Tachycardic. No murmurs, rubs, or gallops. No edema. Pedal pulses 2+ bilaterally.  GASTROINTESTINAL:  Soft, nontender, nondistended. No masses. Positive bowel sounds. No hepatosplenomegaly.  MUSCULOSKELETAL: No swelling, clubbing, or edema. Range of motion full in all extremities.  NEUROLOGIC: Unable to assess given mental status/medical condition SKIN: No ulceration, lesions, rashes, or cyanosis. Skin warm and dry. Turgor intact.  PSYCHIATRIC: Unable to assess given mental status/medical condition       LABORATORY PANEL:   CBC  Recent Labs Lab 01/20/16 0506  WBC 11.7*  HGB 11.1*  HCT 32.9*  PLT 476*   ------------------------------------------------------------------------------------------------------------------  Chemistries   Recent Labs Lab 01/20/16 0140 01/20/16 0506  NA 136 137  K 5.3* 4.9  CL 105 107  CO2 19* 18*  GLUCOSE 112* 129*  BUN 41* 41*  CREATININE 6.02* 6.15*  CALCIUM 8.2* 8.1*  AST 27  --   ALT 14  --   ALKPHOS 126  --   BILITOT 0.2*  --    ------------------------------------------------------------------------------------------------------------------  Cardiac Enzymes  Recent Labs Lab 01/18/16 1902  TROPONINI 0.18*   ------------------------------------------------------------------------------------------------------------------  RADIOLOGY:  Dg Chest 2 View  Result Date: 01/19/2016 CLINICAL DATA:  Hemoptysis. Discharged from hospital today after being treated for pneumonia. EXAM: CHEST  2 VIEW COMPARISON:  Portable chest radiograph yesterday. Most recent chest CT 12/02/2015 FINDINGS: Chronic right lung base opacity with pleural effusion. Lower lung volumes from prior exam. There is increased bronchial thickening and left basilar atelectasis. Possible small left pleural effusion. Heart size and mediastinal contours are unchanged. No pneumothorax. Diffuse bony under mineralization. IMPRESSION: Increased bronchial thickening and left basilar atelectasis. Possible small left pleural effusion. Chronic right lower lobe opacity and pleural  effusion. Electronically  Signed   By: Jeb Levering M.D.   On: 01/19/2016 23:25   Ct Chest Wo Contrast  Result Date: 01/20/2016 CLINICAL DATA:  Acute onset of hemoptysis. Recent diagnosis of pneumonia. Initial encounter. EXAM: CT CHEST WITHOUT CONTRAST TECHNIQUE: Multidetector CT imaging of the chest was performed following the standard protocol without IV contrast. COMPARISON:  Chest radiograph performed earlier today at 11:09 p.m., and CT of the chest performed 12/02/2015 FINDINGS: Cardiovascular: Diffuse coronary artery calcifications are seen. Scattered calcification is noted along the thoracic aorta and proximal great vessels, with likely severe luminal narrowing at the proximal left subclavian artery. Mediastinum/Nodes: Enlarged mediastinal nodes are seen, measuring up to 2.0 cm at the subcarinal region. A 1.1 cm node is noted at the right paratracheal region. No pericardial effusion is identified. Lungs/Pleura: Small right and moderate left pleural effusions are noted. There is mild peripheral wall thickening with regard to the right-sided pleural effusion, raising question for mild right-sided empyema. Would correlate for associated symptoms. Dense right lower lobe airspace opacification is noted, and patchy central left-sided airspace opacity may also reflect pneumonia. Upper Abdomen: The visualized portions of the liver and spleen are grossly unremarkable. The visualized portions of the pancreas and adrenal glands are within normal limits. The patient is status post cholecystectomy, with clips noted at the gallbladder fossa. Musculoskeletal: No acute osseous abnormalities are identified. Posterior calcified disc protrusions are noted at the mid to lower thoracic spine. The chest wall is grossly unremarkable in appearance. IMPRESSION: 1. Small right and moderate left pleural effusions, with mild peripheral wall thickening at the right-sided pleural effusion, raising question for mild right-sided  empyema. Would correlate for associated symptoms. 2. Dense right lower lobe airspace opacification, and patchy central left-sided airspace opacity, concerning for multifocal pneumonia. 3. Enlarged mediastinal nodes, measuring up to 2.0 cm at the subcarinal region. After completion of treatment for the patient's acute infection, follow-up CT could be considered to assess for improvement in lymphadenopathy. 4. Diffuse coronary artery calcifications seen. 5. Scattered calcification along the thoracic aorta and proximal great vessels, with likely severe luminal narrowing at the proximal left subclavian artery. Electronically Signed   By: Garald Balding M.D.   On: 01/20/2016 00:49    EKG:   Orders placed or performed during the hospital encounter of 01/18/16  . EKG 12-Lead  . EKG 12-Lead    ASSESSMENT AND PLAN:   Maryum Batterson is a 60 y.o. female presenting with Hemoptysis . Admitted 01/19/2016 : Day #: 0 days 1. Acute on chronic hypoxic respiratory failure: Multifactorial setting of right lower lobe cancer, pneumonia, atelectasis, pleural effusion: Vancomycin/Zosyn for antibiotic coverage follow culture data, and breathing treatments, supplemental oxygen, as stated in prior note dictation is full code as discussed with healthcare power of attorney at this time she would not benefit from BiPAP given mental status if respiratory status does not improve will require intubation 2. Hypothyroidism unspecified: Synthroid 3. Hyperlipidemia unspecified: Statin therapy 4. GERD without esophagitis: PPI therapy Case discussed with pace nurse practitioner at bedside as well as with patient healthcare power of attorney [place]ausch  All the records are reviewed and case discussed with Care Management/Social Workerr. Management plans discussed with the patient, family and they are in agreement.  CODE STATUS: full TOTAL TIME TAKING CARE OF THIS PATIENT: 60 critical minutes.   POSSIBLE D/C IN 2-3DAYS, DEPENDING ON  CLINICAL CONDITION.   Brennen Camper,  Karenann Cai.D on 01/20/2016 at 1:45 PM  Between 7am to 6pm - Pager - [phone]  After 6pm: House Pager: - [phone]  [facility]  Office  [phone]  CC: Primary care physician; [facility]

## 2016-01-20 NOTE — Consult Note (Addendum)
Name: Sherri Davidson MRN: [id number] DOB: 1955/11/28    ADMISSION DATE:  01/19/2016 CONSULTATION DATE:  01/20/2016  REFERRING MD :  Dr. Lavetta Nielsen  CHIEF COMPLAINT:  Hemoptysis   BRIEF PATIENT DESCRIPTION:  This is a 60 yo female who presents to [facility] ER with c/o hemoptysis she was recently discharged 8/6 with a diagnosis of pneumonia and was discharged on oral antibiotics.    SIGNIFICANT EVENTS  8/7-Pt discharged from [facility] with diagnosis of pneumonia and discharged on oral antibiotics. 8/8-Pt presents to [facility] ER with c/o hemoptysis therefore she was admitted to [facility] 8/8-PCCM consulted for management of hemoptysis  STUDIES:  CT of chest 8/8>>Small right and moderate left pleural effusions, with mild peripheral wall thickening at the right-sided pleural effusion, raising question for mild right-sided empyema. Would correlate for associated symptoms. Dense right lower lobe airspace opacification, and patchy central left-sided airspace opacity, concerning for multifocal pneumonia. Enlarged mediastinal nodes, measuring up to 2.0 cm at the subcarinal region. After completion of treatment for the patient's acute infection, follow-up CT could be considered to assess for improvement in lymphadenopathy. Diffuse coronary artery calcifications seen. Scattered calcification along the thoracic aorta and proximal great vessels, with likely severe luminal narrowing at the proximal left subclavian artery. 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 She presented to [facility] ER 8/7 she was discharged from the hospital 8/6 with pneumonia and discharged on oral antibiotics, however after she went home she had 2 episodes of scant amount of hemoptysis on 8/6 therefore prompting her to return to the ER.  Upon arrival to the ER on 8/7 CT of chest was  performed that showed multifocal pneumonia and possible right lung empyema.  She was started on broad spectrum antibiotics in the ER.  According to pts daughter in April 2017 she was told a CT of chest showed an right lung mass, however at that time the pt declined bronchoscopy for biopsy.  PCCM consulted on 8/8 due to hemoptysis.  PAST MEDICAL HISTORY :   has a past medical history of Blood clot in vein (right hand); Cancer [facility]); CHF (congestive heart failure) ([place]); CVA, old, hemiparesis ([place]); Diabetes ([place]); ESRD on peritoneal dialysis ([place]); Hypertension; Hypothyroidism; Seizures ([place]); and Stroke ([place]).  has a past surgical history that includes Cholecystectomy; Amputation toe; and Insertion of dialysis catheter. Prior to Admission medications   Medication Sig Start Date End Date Taking? Authorizing Provider  albuterol (PROVENTIL HFA;VENTOLIN HFA) 108 (90 Base) MCG/ACT inhaler Inhale 2 puffs into the lungs every 6 (six) hours as needed for wheezing or shortness of breath. 01/19/16   Nicholes Mango, MD  amoxicillin-clavulanate (AUGMENTIN) 875-125 MG tablet Take 1 tablet by mouth 2 (two) times daily. 01/19/16   Nicholes Mango, MD  aspirin EC 81 MG tablet Take 1 tablet (81 mg total) by mouth daily. For heart protection 11/28/15   Cherene Altes, MD  clopidogrel (PLAVIX) 75 MG tablet Take 1 tablet (75 mg total) by mouth daily. For heart protection 11/28/15   Cherene Altes, MD  guaiFENesin (ROBITUSSIN) 100 MG/5ML SOLN Take 5 mLs (100 mg total) by mouth every 6 (six) hours as needed for cough or to loosen phlegm. 01/19/16   Nicholes Mango, MD  levETIRAcetam (KEPPRA) 500 MG tablet Take 1 tablet (500 mg total) by mouth 2 (two) times daily.  Patient taking differently: Take 500 mg by mouth 2 (two) times daily. For seizure prevention 05/28/15   Vaughan Basta, MD  levothyroxine (SYNTHROID, LEVOTHROID) 50 MCG tablet Take 50 mcg by mouth daily. For hypothyroidism    Historical Provider, MD  loperamide  (IMODIUM) 2 MG capsule Take 2-4 mg by mouth as needed for diarrhea or loose stools.    Historical Provider, MD  Maltodextrin-Xanthan Gum ([place]) POWD Use as per product instructions to thicken liquids to nectar thick consistency. 11/21/15   Cherene Altes, MD  midodrine (PROAMATINE) 5 MG tablet Take 1 tablet (5 mg total) by mouth 3 (three) times daily with meals. Patient taking differently: Take 5 mg by mouth 2 (two) times daily with a meal.  11/21/15   Cherene Altes, MD  multivitamin (RENA-VIT) TABS tablet Take 1 tablet by mouth at bedtime. Patient taking differently: Take 1 tablet by mouth daily.  11/21/15   Cherene Altes, MD  mupirocin ointment (BACTROBAN) 2 % Place 1 application into the nose 2 (two) times daily. 01/19/16 01/24/16  Nicholes Mango, MD  nicotine (NICODERM CQ - DOSED IN MG/24 HR) 7 mg/24hr patch Place 1 patch (7 mg total) onto the skin daily. 01/19/16   Nicholes Mango, MD  ondansetron (ZOFRAN ODT) 4 MG disintegrating tablet Take 1 tablet (4 mg total) by mouth every 8 (eight) hours as needed for nausea or vomiting. 12/03/15   Hillary Bow, MD  oxyCODONE-acetaminophen (PERCOCET/ROXICET) 5-325 MG tablet Take 1 tablet by mouth daily as needed for moderate pain or severe pain.    Historical Provider, MD  pantoprazole (PROTONIX) 40 MG tablet Take 1 tablet (40 mg total) by mouth 2 (two) times daily before a meal. 12/03/15   Srikar Sudini, MD  potassium chloride SA (K-DUR,KLOR-CON) 20 MEQ tablet Take 20 mEq by mouth daily.     Historical Provider, MD  ranitidine (ZANTAC) 150 MG tablet Take 150 mg by mouth daily as needed for heartburn.    Historical Provider, MD  simvastatin (ZOCOR) 20 MG tablet Take 20 mg by mouth at bedtime.     Historical Provider, MD  Vitamin D, Ergocalciferol, (DRISDOL) 50000 units CAPS capsule Take 50,000 Units by mouth every 30 (thirty) days. On or about the 1st of each month    Historical Provider, MD  Zinc Oxide 13 % CREA Apply 1 application topically  daily as needed (for barrier cream on buttocks while toileting).    Historical Provider, MD   Allergies  Allergen Reactions  . Gabapentin Anaphylaxis and Other (See Comments)    Pt states she cant breathe  . Adhesive [Tape] Hives    Please use paper tape  . Salicylates Hives and Nausea Only    Uncoated.  Diona Fanti [Aspirin] Other (See Comments)    GI upset for non-enteric coated aspirin    FAMILY HISTORY:  family history includes CVA in her father and mother; Heart failure in her sister. SOCIAL HISTORY:  reports that she has been smoking Cigarettes.  She has a 7.50 pack-year smoking history. She has never used smokeless tobacco. She reports that she does not drink alcohol or use drugs.  REVIEW OF SYSTEMS:  Positives in BOLD Constitutional: Negative for fever, chills, weight loss, malaise/fatigue and diaphoresis.  HENT: Negative for hearing loss, ear pain, nosebleeds, congestion, sore throat, neck pain, tinnitus and ear discharge.   Eyes: Negative for blurred vision, double vision, photophobia, pain, discharge and redness.  Respiratory: cough, hemoptysis, sputum production, shortness of breath, wheezing and stridor.  Cardiovascular: Negative for chest pain, palpitations, orthopnea, claudication, leg swelling and PND.  Gastrointestinal: Negative for heartburn, nausea, vomiting, abdominal pain, diarrhea, constipation, blood in stool and melena.  Genitourinary: Negative for dysuria, urgency, frequency, hematuria and flank pain.  Musculoskeletal: Negative for myalgias, back pain, joint pain and falls.  Skin: Negative for itching and rash.  Neurological: dysphagia, dizziness, tingling, tremors, sensory change, speech change, focal weakness, seizures, loss of consciousness, weakness and headaches.  Endo/Heme/Allergies: Negative for environmental allergies and polydipsia. Does not bruise/bleed easily.  SUBJECTIVE:  Pt currently states she is not short of breath on 3L O2 via nasal cannula.  She  has had no episodes of hemoptysis since admission 8/7.  VITAL SIGNS: Temp:  [97.8 F (36.6 C)-98 F (36.7 C)] 98 F (36.7 C) (08/08 0451) Pulse Rate:  [93-117] 99 (08/08 1208) Resp:  [18-24] 24 (08/08 0752) BP: (104-142)/(77-102) 115/78 (08/08 1208) SpO2:  [78 %-97 %] 93 % (08/08 1321) Weight:  [125 lb (56.7 kg)] 125 lb (56.7 kg) (08/07 2233)  PHYSICAL EXAMINATION: General:  Well developed, well nourished Neuro:  Alert confused to situation at times, follows commands HEENT: supple, no JVD Cardiovascular:  S12, rrr, no M/R/G Lungs:  Diminished throughout, mildly tachypnic, no wheezing, rales or rhonch Abdomen:  +BS x4, soft, non tender, non distended Musculoskeletal:  Normal bulk and tone Skin:  Intact, no rashes or lesions   Recent Labs Lab 01/19/16 0622 01/20/16 0140 01/20/16 0506  NA 135 136 137  K 4.5 5.3* 4.9  CL 106 105 107  CO2 19* 19* 18*  BUN 35* 41* 41*  CREATININE 6.02* 6.02* 6.15*  GLUCOSE 130* 112* 129*    Recent Labs Lab 01/19/16 0622 01/20/16 0140 01/20/16 0506  HGB 10.4* 11.1* 11.1*  HCT 30.6* 33.2* 32.9*  WBC 9.5 10.6 11.7*  PLT 373 394 476*   Dg Chest 2 View  Result Date: 01/19/2016 CLINICAL DATA:  Hemoptysis. Discharged from hospital today after being treated for pneumonia. EXAM: CHEST  2 VIEW COMPARISON:  Portable chest radiograph yesterday. Most recent chest CT 12/02/2015 FINDINGS: Chronic right lung base opacity with pleural effusion. Lower lung volumes from prior exam. There is increased bronchial thickening and left basilar atelectasis. Possible small left pleural effusion. Heart size and mediastinal contours are unchanged. No pneumothorax. Diffuse bony under mineralization. IMPRESSION: Increased bronchial thickening and left basilar atelectasis. Possible small left pleural effusion. Chronic right lower lobe opacity and pleural effusion. Electronically Signed   By: Jeb Levering M.D.   On: 01/19/2016 23:25   Ct Chest Wo Contrast  Result  Date: 01/20/2016 CLINICAL DATA:  Acute onset of hemoptysis. Recent diagnosis of pneumonia. Initial encounter. EXAM: CT CHEST WITHOUT CONTRAST TECHNIQUE: Multidetector CT imaging of the chest was performed following the standard protocol without IV contrast. COMPARISON:  Chest radiograph performed earlier today at 11:09 p.m., and CT of the chest performed 12/02/2015 FINDINGS: Cardiovascular: Diffuse coronary artery calcifications are seen. Scattered calcification is noted along the thoracic aorta and proximal great vessels, with likely severe luminal narrowing at the proximal left subclavian artery. Mediastinum/Nodes: Enlarged mediastinal nodes are seen, measuring up to 2.0 cm at the subcarinal region. A 1.1 cm node is noted at the right paratracheal region. No pericardial effusion is identified. Lungs/Pleura: Small right and moderate left pleural effusions are noted. There is mild peripheral wall thickening with regard to the right-sided pleural effusion, raising question for mild right-sided empyema. Would correlate for associated symptoms. Dense right lower lobe airspace opacification is noted, and patchy central  left-sided airspace opacity may also reflect pneumonia. Upper Abdomen: The visualized portions of the liver and spleen are grossly unremarkable. The visualized portions of the pancreas and adrenal glands are within normal limits. The patient is status post cholecystectomy, with clips noted at the gallbladder fossa. Musculoskeletal: No acute osseous abnormalities are identified. Posterior calcified disc protrusions are noted at the mid to lower thoracic spine. The chest wall is grossly unremarkable in appearance. IMPRESSION: 1. Small right and moderate left pleural effusions, with mild peripheral wall thickening at the right-sided pleural effusion, raising question for mild right-sided empyema. Would correlate for associated symptoms. 2. Dense right lower lobe airspace opacification, and patchy central  left-sided airspace opacity, concerning for multifocal pneumonia. 3. Enlarged mediastinal nodes, measuring up to 2.0 cm at the subcarinal region. After completion of treatment for the patient's acute infection, follow-up CT could be considered to assess for improvement in lymphadenopathy. 4. Diffuse coronary artery calcifications seen. 5. Scattered calcification along the thoracic aorta and proximal great vessels, with likely severe luminal narrowing at the proximal left subclavian artery. Electronically Signed   By: Garald Balding M.D.   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  There is an increase in size to the right sided mass/atelectasis.   Acute Hemoptysis -- likely due to multiple underlying lung issues while on plavix.  --Continue to monitor, if the patient continues to have hemoptysis, then plavix will need to be discontinued.   Right lung mass/atelectasis.  --There is increasing size of the patient's mass-- this could represent mass vs. Organizing pneumonia vs. Post obstructive pneumonia from endobrochial or peri-bronchial lesion. 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  --Discussed with PoA that patient would require bronchoscopy for this, but will be high risk given history of seizure, CVA, Systolic CHF, debility, chronic respiratory failure, and ESRD. Also given all of these problems, she may not be a candidate for chemotherapy.  --Would suggest a palliative care consult, per poa, the patient appears to have a poor grasp on her multiple conditions and I am doubtful that she can make realistic medical decisions.  --Should they opt for an intervention would need neurology consult to evaluate risk of  stopping plavix for one week as well as cardiac clearance.   Right loculated effusion, chronic. 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  --Unchanged since April of 2017, given her numerous medical problems, this does not need intervention at this time.   Left Pleural effusion.  --Likely due to volume overload from ESRD and chronic systolic CHF with NI=62%.  --Continue PD, pt may require HD.  --IR consult for thoracentesis if dyspnea progresses.      Marda Stalker, [place] Pager [phone] (please enter 7 digits) PCCM Consult Pager [phone] (please enter 7 digits)  Pt. Seen and examined agree with assessment and plan as amended above.   Marda Stalker, M.D.  01/20/2016

## 2016-01-20 NOTE — Progress Notes (Signed)
Attending MD notified, new orders received. Pt tried pulling off nasal cannula, yet is more calm in bed now. Continue to assess.

## 2016-01-20 NOTE — H&P (Signed)
[facility] [facility] [place] NAME: Sherri Davidson    MR#:  [id number]  DATE OF BIRTH:  [birth year]  DATE OF ADMISSION:  01/19/2016  PRIMARY CARE PHYSICIAN: [facility]   REQUESTING/REFERRING PHYSICIAN:   CHIEF COMPLAINT:   Chief Complaint  Patient presents with  . Hemoptysis    HISTORY OF PRESENT ILLNESS: Sherri Davidson  is a 60 y.o. female with a known history of End-stage renal disease on peritoneal dialysis, CVA,, congestive heart failure,, lung cancer, hypertension, hypothyroid, seizure disorder was discharged yesterday from or hospital after being managed for pneumonia. Patient was discharged on oral antibiotics and after she went home she coughed up blood. According to patient it was scanty amount and she again returned to the emergency room. Patient was worked up with a CT chest which showed multifocal pneumonia and possible right lung empyema. She was started on IV broad-spectrum antibiotics vancomycin and cefepime in the emergency room. Has cough but no history of fever or chills. No complaints of any chest pain. No history of fall or head injury. No complaints of headache dizziness blurry vision. Hospitalist service was consulted for further care of the patient.  PAST MEDICAL HISTORY:   Past Medical History:  Diagnosis Date  . Blood clot in vein right hand  . Cancer ([place])    lung cancer  . CHF (congestive heart failure) ([place])   . CVA, old, hemiparesis ([place])   . Diabetes ([place])   . ESRD on peritoneal dialysis ([place])   . Hypertension   . Hypothyroidism   . Seizures ([place])   . Stroke [facility])     PAST SURGICAL HISTORY: Past Surgical History:  Procedure Laterality Date  . AMPUTATION TOE    . CHOLECYSTECTOMY    . INSERTION OF DIALYSIS CATHETER      SOCIAL HISTORY:  Social History  Substance Use Topics  . Smoking status: Current Every Day Smoker    Packs/day: 0.25    Years: 30.00    Types: Cigarettes  .  Smokeless tobacco: Never Used  . Alcohol use No    FAMILY HISTORY:  Family History  Problem Relation Age of Onset  . CVA Mother   . CVA Father   . Heart failure Sister     DRUG ALLERGIES:  Allergies  Allergen Reactions  . Gabapentin Anaphylaxis and Other (See Comments)    Pt states she cant breathe  . Adhesive [Tape] Hives    Please use paper tape  . Salicylates Hives and Nausea Only    Uncoated.  Diona Fanti [Aspirin] Other (See Comments)    GI upset for non-enteric coated aspirin    REVIEW OF SYSTEMS:   CONSTITUTIONAL: No fever, fatigue or weakness.  EYES: No blurred or double vision.  EARS, NOSE, AND THROAT: No tinnitus or ear pain.  RESPIRATORY: Has cough, no shortness of breath, wheezing  One episode of hemoptysis noted CARDIOVASCULAR: No chest pain, orthopnea, edema.  GASTROINTESTINAL: No nausea, vomiting, diarrhea or abdominal pain.  GENITOURINARY: No dysuria, hematuria.  ENDOCRINE: No polyuria, nocturia,  HEMATOLOGY: No anemia, easy bruising or bleeding SKIN: No rash or lesion. MUSCULOSKELETAL: No joint pain or arthritis.   NEUROLOGIC: No tingling, numbness, weakness.  PSYCHIATRY: No anxiety or depression.   MEDICATIONS AT HOME:  Prior to Admission medications   Medication Sig Start Date End Date Taking? Authorizing Provider  albuterol (PROVENTIL HFA;VENTOLIN HFA) 108 (90 Base) MCG/ACT inhaler Inhale 2 puffs into the lungs every 6 (six) hours  as needed for wheezing or shortness of breath. 01/19/16   Nicholes Mango, MD  amoxicillin-clavulanate (AUGMENTIN) 875-125 MG tablet Take 1 tablet by mouth 2 (two) times daily. 01/19/16   Nicholes Mango, MD  aspirin EC 81 MG tablet Take 1 tablet (81 mg total) by mouth daily. For heart protection 11/28/15   Cherene Altes, MD  clopidogrel (PLAVIX) 75 MG tablet Take 1 tablet (75 mg total) by mouth daily. For heart protection 11/28/15   Cherene Altes, MD  guaiFENesin (ROBITUSSIN) 100 MG/5ML SOLN Take 5 mLs (100 mg total) by mouth every  6 (six) hours as needed for cough or to loosen phlegm. 01/19/16   Nicholes Mango, MD  levETIRAcetam (KEPPRA) 500 MG tablet Take 1 tablet (500 mg total) by mouth 2 (two) times daily. Patient taking differently: Take 500 mg by mouth 2 (two) times daily. For seizure prevention 05/28/15   Vaughan Basta, MD  levothyroxine (SYNTHROID, LEVOTHROID) 50 MCG tablet Take 50 mcg by mouth daily. For hypothyroidism    Historical Provider, MD  loperamide (IMODIUM) 2 MG capsule Take 2-4 mg by mouth as needed for diarrhea or loose stools.    Historical Provider, MD  Maltodextrin-Xanthan Gum ([place]) POWD Use as per product instructions to thicken liquids to nectar thick consistency. 11/21/15   Cherene Altes, MD  midodrine (PROAMATINE) 5 MG tablet Take 1 tablet (5 mg total) by mouth 3 (three) times daily with meals. Patient taking differently: Take 5 mg by mouth 2 (two) times daily with a meal.  11/21/15   Cherene Altes, MD  multivitamin (RENA-VIT) TABS tablet Take 1 tablet by mouth at bedtime. Patient taking differently: Take 1 tablet by mouth daily.  11/21/15   Cherene Altes, MD  mupirocin ointment (BACTROBAN) 2 % Place 1 application into the nose 2 (two) times daily. 01/19/16 01/24/16  Nicholes Mango, MD  nicotine (NICODERM CQ - DOSED IN MG/24 HR) 7 mg/24hr patch Place 1 patch (7 mg total) onto the skin daily. 01/19/16   Nicholes Mango, MD  ondansetron (ZOFRAN ODT) 4 MG disintegrating tablet Take 1 tablet (4 mg total) by mouth every 8 (eight) hours as needed for nausea or vomiting. 12/03/15   Hillary Bow, MD  oxyCODONE-acetaminophen (PERCOCET/ROXICET) 5-325 MG tablet Take 1 tablet by mouth daily as needed for moderate pain or severe pain.    Historical Provider, MD  pantoprazole (PROTONIX) 40 MG tablet Take 1 tablet (40 mg total) by mouth 2 (two) times daily before a meal. 12/03/15   Srikar Sudini, MD  potassium chloride SA (K-DUR,KLOR-CON) 20 MEQ tablet Take 20 mEq by mouth daily.     Historical  Provider, MD  ranitidine (ZANTAC) 150 MG tablet Take 150 mg by mouth daily as needed for heartburn.    Historical Provider, MD  simvastatin (ZOCOR) 20 MG tablet Take 20 mg by mouth at bedtime.     Historical Provider, MD  Vitamin D, Ergocalciferol, (DRISDOL) 50000 units CAPS capsule Take 50,000 Units by mouth every 30 (thirty) days. On or about the 1st of each month    Historical Provider, MD  Zinc Oxide 13 % CREA Apply 1 application topically daily as needed (for barrier cream on buttocks while toileting).    Historical Provider, MD      PHYSICAL EXAMINATION:   VITAL SIGNS: Blood pressure (!) 123/91, pulse 100, temperature 97.8 F (36.6 C), temperature source Oral, resp. rate 18, height '5\' 6"'$  (1.676 m), weight 56.7 kg (125 lb), SpO2 93 %.  GENERAL:  60 y.o.-year-old patient lying in the bed with no acute distress.  EYES: Pupils equal, round, reactive to light and accommodation. No scleral icterus. Extraocular muscles intact.  HEENT: Head atraumatic, normocephalic. Oropharynx and nasopharynx clear.  NECK:  Supple, no jugular venous distention. No thyroid enlargement, no tenderness.  LUNGS: Decreased breath sounds bilaterally, no wheezing, rales heard in both lungs. No use of accessory muscles of respiration.  CARDIOVASCULAR: S1, S2 normal. No murmurs, rubs, or gallops.  ABDOMEN: Soft, nontender, nondistended. Bowel sounds present. No organomegaly or mass.  EXTREMITIES: No pedal edema, cyanosis, or clubbing.  NEUROLOGIC: Cranial nerves II through XII are intact. Muscle strength 5/5 in all extremities. Sensation intact. Gait not checked.  PSYCHIATRIC: The patient is alert and oriented x 3.  SKIN: No obvious rash, lesion, or ulcer.   LABORATORY PANEL:   CBC  Recent Labs Lab 01/18/16 0117 01/18/16 0718 01/19/16 0622 01/20/16 0140  WBC 15.5* 14.6* 9.5 10.6  HGB 12.0 11.2* 10.4* 11.1*  HCT 36.0 33.8* 30.6* 33.2*  PLT 417 431 373 394  MCV 87.3 87.1 86.1 86.8  MCH 29.0 28.9 29.2 29.1   MCHC 33.2 33.1 33.9 33.5  RDW 18.5* 18.3* 18.3* 18.5*  LYMPHSABS  --   --   --  1.4  MONOABS  --   --   --  0.9  EOSABS  --   --   --  0.0  BASOSABS  --   --   --  0.1   ------------------------------------------------------------------------------------------------------------------  Chemistries   Recent Labs Lab 01/18/16 0117 01/18/16 0718 01/19/16 0622 01/20/16 0140  NA 134* 134* 135 136  K 4.6 5.5* 4.5 5.3*  CL 105 107 106 105  CO2 18* 16* 19* 19*  GLUCOSE 145* 117* 130* 112*  BUN 36* 37* 35* 41*  CREATININE 5.95* 6.32* 6.02* 6.02*  CALCIUM 8.4* 8.4* 8.0* 8.2*  AST 31  --   --  27  ALT 15  --   --  14  ALKPHOS 166*  --   --  126  BILITOT 0.2*  --   --  0.2*   ------------------------------------------------------------------------------------------------------------------ estimated creatinine clearance is 9 mL/min (by C-G formula based on SCr of 6.02 mg/dL). ------------------------------------------------------------------------------------------------------------------ No results for input(s): TSH, T4TOTAL, T3FREE, THYROIDAB in the last 72 hours.  Invalid input(s): FREET3   Coagulation profile No results for input(s): INR, PROTIME in the last 168 hours. ------------------------------------------------------------------------------------------------------------------- No results for input(s): DDIMER in the last 72 hours. -------------------------------------------------------------------------------------------------------------------  Cardiac Enzymes  Recent Labs Lab 01/18/16 0718 01/18/16 1310 01/18/16 1902  TROPONINI 0.23* 0.18* 0.18*   ------------------------------------------------------------------------------------------------------------------ Invalid input(s): POCBNP  ---------------------------------------------------------------------------------------------------------------  Urinalysis    Component Value Date/Time   COLORURINE YELLOW  11/08/2015 1707   APPEARANCEUR TURBID (A) 11/08/2015 1707   APPEARANCEUR Clear 05/30/2014 1618   LABSPEC 1.018 11/08/2015 1707   LABSPEC 1.009 05/30/2014 1618   PHURINE 7.5 11/08/2015 1707   GLUCOSEU 100 (A) 11/08/2015 1707   GLUCOSEU >=500 05/30/2014 1618   HGBUR LARGE (A) 11/08/2015 1707   BILIRUBINUR NEGATIVE 11/08/2015 1707   BILIRUBINUR Negative 05/30/2014 1618   KETONESUR NEGATIVE 11/08/2015 1707   PROTEINUR >300 (A) 11/08/2015 1707   NITRITE NEGATIVE 11/08/2015 1707   LEUKOCYTESUR LARGE (A) 11/08/2015 1707   LEUKOCYTESUR Negative 05/30/2014 1618     RADIOLOGY: Dg Chest 2 View  Result Date: 01/19/2016 CLINICAL DATA:  Hemoptysis. Discharged from hospital today after being treated for pneumonia. EXAM: CHEST  2 VIEW COMPARISON:  Portable chest radiograph yesterday. Most recent chest CT 12/02/2015  FINDINGS: Chronic right lung base opacity with pleural effusion. Lower lung volumes from prior exam. There is increased bronchial thickening and left basilar atelectasis. Possible small left pleural effusion. Heart size and mediastinal contours are unchanged. No pneumothorax. Diffuse bony under mineralization. IMPRESSION: Increased bronchial thickening and left basilar atelectasis. Possible small left pleural effusion. Chronic right lower lobe opacity and pleural effusion. Electronically Signed   By: Jeb Levering M.D.   On: 01/19/2016 23:25   Ct Chest Wo Contrast  Result Date: 01/20/2016 CLINICAL DATA:  Acute onset of hemoptysis. Recent diagnosis of pneumonia. Initial encounter. EXAM: CT CHEST WITHOUT CONTRAST TECHNIQUE: Multidetector CT imaging of the chest was performed following the standard protocol without IV contrast. COMPARISON:  Chest radiograph performed earlier today at 11:09 p.m., and CT of the chest performed 12/02/2015 FINDINGS: Cardiovascular: Diffuse coronary artery calcifications are seen. Scattered calcification is noted along the thoracic aorta and proximal great vessels,  with likely severe luminal narrowing at the proximal left subclavian artery. Mediastinum/Nodes: Enlarged mediastinal nodes are seen, measuring up to 2.0 cm at the subcarinal region. A 1.1 cm node is noted at the right paratracheal region. No pericardial effusion is identified. Lungs/Pleura: Small right and moderate left pleural effusions are noted. There is mild peripheral wall thickening with regard to the right-sided pleural effusion, raising question for mild right-sided empyema. Would correlate for associated symptoms. Dense right lower lobe airspace opacification is noted, and patchy central left-sided airspace opacity may also reflect pneumonia. Upper Abdomen: The visualized portions of the liver and spleen are grossly unremarkable. The visualized portions of the pancreas and adrenal glands are within normal limits. The patient is status post cholecystectomy, with clips noted at the gallbladder fossa. Musculoskeletal: No acute osseous abnormalities are identified. Posterior calcified disc protrusions are noted at the mid to lower thoracic spine. The chest wall is grossly unremarkable in appearance. IMPRESSION: 1. Small right and moderate left pleural effusions, with mild peripheral wall thickening at the right-sided pleural effusion, raising question for mild right-sided empyema. Would correlate for associated symptoms. 2. Dense right lower lobe airspace opacification, and patchy central left-sided airspace opacity, concerning for multifocal pneumonia. 3. Enlarged mediastinal nodes, measuring up to 2.0 cm at the subcarinal region. After completion of treatment for the patient's acute infection, follow-up CT could be considered to assess for improvement in lymphadenopathy. 4. Diffuse coronary artery calcifications seen. 5. Scattered calcification along the thoracic aorta and proximal great vessels, with likely severe luminal narrowing at the proximal left subclavian artery. Electronically Signed   By: Garald Balding M.D.   On: 01/20/2016 00:49    EKG: Orders placed or performed during the hospital encounter of 01/18/16  . EKG 12-Lead  . EKG 12-Lead    IMPRESSION AND PLAN: 60 year old female patient with history of end-stage renal disease on dialysis, hypothyroidism, seizure disorder, CVA, congestive heart failure presented to the emergency room with one episode of hemoptysis. Admitting diagnosis 1. Multifocal bilateral pneumonia 2. Healthcare associated pneumonia 3. Hemoptysis 4. End-stage renal disease on dialysis Treatment plan Admit patient to medical floor Pulmonary consultation Start patient on IV vancomycin and IV cefepime antibiotics Avoid aspirin and Plavix DVT prophylaxis with sequential compression devices to lower extremities Nephrology consultation Monitor hemoglobin and hematocrit. Supportive care  All the records are reviewed and case discussed with ED provider. Management plans discussed with the patient, family and they are in agreement.  CODE STATUS:FULL    Code Status Orders        Start  Ordered   01/20/16 0349  Full code  Continuous     01/20/16 0348    Code Status History    Date Active Date Inactive Code Status Order ID Comments User Context   01/18/2016  5:18 AM 01/18/2016  7:00 AM Full Code [id number]  Saundra Shelling, MD Inpatient   12/02/2015  3:15 PM 12/03/2015  9:02 PM Full Code [id number]  Vaughan Basta, MD Inpatient   11/08/2015 10:33 PM 11/21/2015  5:53 PM Full Code [id number]  Norval Morton, MD Inpatient   08/17/2015  8:07 AM 08/18/2015  6:39 PM Full Code [id number]  Epifanio Lesches, MD ED   05/24/2015  3:42 AM 05/28/2015  5:34 PM Full Code [id number]  Saundra Shelling, MD Inpatient       TOTAL TIME TAKING CARE OF THIS PATIENT: 52 minutes.    Saundra Shelling M.D on 01/20/2016 at 3:49 AM  Between 7am to 6pm - Pager - [phone]  After 6pm go to [email or web address] - password [email or web address] [facility]  Office   [phone]  CC: Primary care physician; [facility]

## 2016-01-20 NOTE — ED Notes (Signed)
Called pharmacy to send vancomycin

## 2016-01-20 NOTE — Progress Notes (Signed)
Pharmacy Antibiotic Note  Sherri Davidson is a 60 y.o. female admitted on 01/19/2016 with pneumonia.  Pharmacy has been consulted for vancomycin and cefepime dosing.  Plan: DW 56.7 kg. Patient is on PD 1500 mg vancomycin x1 ordered. Will check first level in 3 days and redose when level <20.  Cefepime 2 grams q 48 hours ordered.   Height: '5\' 6"'$  (167.6 cm) Weight: 125 lb (56.7 kg) IBW/kg (Calculated) : 59.3  Temp (24hrs), Avg:97.9 F (36.6 C), Min:97.8 F (36.6 C), Max:98 F (36.7 C)   Recent Labs Lab 01/18/16 0117 01/18/16 0718 01/19/16 0622 01/20/16 0140  WBC 15.5* 14.6* 9.5 10.6  CREATININE 5.95* 6.32* 6.02* 6.02*    Estimated Creatinine Clearance: 9 mL/min (by C-G formula based on SCr of 6.02 mg/dL).    Allergies  Allergen Reactions  . Gabapentin Anaphylaxis and Other (See Comments)    Pt states she cant breathe  . Adhesive [Tape] Hives    Please use paper tape  . Salicylates Hives and Nausea Only    Uncoated.  Diona Fanti [Aspirin] Other (See Comments)    GI upset for non-enteric coated aspirin    Antimicrobials this admission: vancomycin  >>  cefepime  >>   Dose adjustments this admission:   Microbiology results: 8/8 BCx: pending 8/6 MRSA PCR: (+)  Thank you for allowing pharmacy to be a part of this patient's care.  Isair Inabinet S 01/20/2016 4:32 AM

## 2016-01-20 NOTE — ED Notes (Addendum)
Pt transported to room 210

## 2016-01-20 NOTE — ED Notes (Signed)
Pharmacy notified of need for Cefepime

## 2016-01-20 NOTE — Care Management (Signed)
Patient readmitted for Acute on chronic hypoxic respiratory failure.  [facility] followed by [facility].  Notified [facility] of admission. I have left [facility] case manager a message to confirm services that patient has in place at home.  Patient lives at home with sister.  PD patient.  Requiring acute O2.  RNCM following for discharge planning.

## 2016-01-20 NOTE — Progress Notes (Signed)
Subjective:  Patient was discharged yesterday but returned for complains of coughing up small amount of blood Patient has been evaluated by pulmonary team At present, she feels slightly nauseous, Denies shortness of breath    Objective:  Vital signs in last 24 hours:  Temp:  [97.8 F (36.6 C)-98 F (36.7 C)] 98 F (36.7 C) (08/08 0451) Pulse Rate:  [93-117] 99 (08/08 1208) Resp:  [18-24] 24 (08/08 0752) BP: (104-142)/(77-102) 115/78 (08/08 1208) SpO2:  [78 %-97 %] 93 % (08/08 1321) FiO2 (%):  [32 %] 32 % (08/08 0823) Weight:  [56.7 kg (125 lb)] 56.7 kg (125 lb) (08/07 2233)  Weight change:  Filed Weights   01/19/16 2233  Weight: 56.7 kg (125 lb)    Intake/Output:    Intake/Output Summary (Last 24 hours) at 01/20/16 1758 Last data filed at 01/20/16 0700  Gross per 24 hour  Intake               35 ml  Output                0 ml  Net               35 ml     Physical Exam: General: No acute distress, sitting up in bed   HEENT Anicteric   Neck Supple   Pulm/lungs Coarse breath sounds, normal effort   CVS/Heart irregular rhythm   Abdomen:  Soft, nontender   Extremities: + dependent peripheral edema   Neurologic: Alert, oriented   Skin: Multiple excoriations   Access: PD catheter        Basic Metabolic Panel:   Recent Labs Lab 01/18/16 0117 01/18/16 0718 01/19/16 0622 01/20/16 0140 01/20/16 0506  NA 134* 134* 135 136 137  K 4.6 5.5* 4.5 5.3* 4.9  CL 105 107 106 105 107  CO2 18* 16* 19* 19* 18*  GLUCOSE 145* 117* 130* 112* 129*  BUN 36* 37* 35* 41* 41*  CREATININE 5.95* 6.32* 6.02* 6.02* 6.15*  CALCIUM 8.4* 8.4* 8.0* 8.2* 8.1*     CBC:  Recent Labs Lab 01/18/16 0117 01/18/16 0718 01/19/16 0622 01/20/16 0140 01/20/16 0506  WBC 15.5* 14.6* 9.5 10.6 11.7*  NEUTROABS  --   --   --  8.3*  --   HGB 12.0 11.2* 10.4* 11.1* 11.1*  HCT 36.0 33.8* 30.6* 33.2* 32.9*  MCV 87.3 87.1 86.1 86.8 87.6  PLT 417 431 373 394 476*       Microbiology:  Recent Results (from the past 720 hour(s))  MRSA PCR Screening     Status: Abnormal   Collection Time: 01/18/16  3:30 PM  Result Value Ref Range Status   MRSA by PCR POSITIVE (A) NEGATIVE Final    Comment:        The GeneXpert MRSA Assay (FDA approved for NASAL specimens only), is one component of a comprehensive MRSA colonization surveillance program. It is not intended to diagnose MRSA infection nor to guide or monitor treatment for MRSA infections. RESULT CALLED TO, READ BACK BY AND VERIFIED WITH: SUSAN PRESTO AT 1818 01/18/16.PMH     Coagulation Studies: No results for input(s): LABPROT, INR in the last 72 hours.  Urinalysis: No results for input(s): COLORURINE, LABSPEC, PHURINE, GLUCOSEU, HGBUR, BILIRUBINUR, KETONESUR, PROTEINUR, UROBILINOGEN, NITRITE, LEUKOCYTESUR in the last 72 hours.  Invalid input(s): APPERANCEUR    Imaging: Dg Chest 2 View  Result Date: 01/19/2016 CLINICAL DATA:  Hemoptysis. Discharged from hospital today after being treated for pneumonia. EXAM: CHEST  2 VIEW COMPARISON:  Portable chest radiograph yesterday. Most recent chest CT 12/02/2015 FINDINGS: Chronic right lung base opacity with pleural effusion. Lower lung volumes from prior exam. There is increased bronchial thickening and left basilar atelectasis. Possible small left pleural effusion. Heart size and mediastinal contours are unchanged. No pneumothorax. Diffuse bony under mineralization. IMPRESSION: Increased bronchial thickening and left basilar atelectasis. Possible small left pleural effusion. Chronic right lower lobe opacity and pleural effusion. Electronically Signed   By: Jeb Levering M.D.   On: 01/19/2016 23:25   Ct Chest Wo Contrast  Result Date: 01/20/2016 CLINICAL DATA:  Acute onset of hemoptysis. Recent diagnosis of pneumonia. Initial encounter. EXAM: CT CHEST WITHOUT CONTRAST TECHNIQUE: Multidetector CT imaging of the chest was performed following the  standard protocol without IV contrast. COMPARISON:  Chest radiograph performed earlier today at 11:09 p.m., and CT of the chest performed 12/02/2015 FINDINGS: Cardiovascular: Diffuse coronary artery calcifications are seen. Scattered calcification is noted along the thoracic aorta and proximal great vessels, with likely severe luminal narrowing at the proximal left subclavian artery. Mediastinum/Nodes: Enlarged mediastinal nodes are seen, measuring up to 2.0 cm at the subcarinal region. A 1.1 cm node is noted at the right paratracheal region. No pericardial effusion is identified. Lungs/Pleura: Small right and moderate left pleural effusions are noted. There is mild peripheral wall thickening with regard to the right-sided pleural effusion, raising question for mild right-sided empyema. Would correlate for associated symptoms. Dense right lower lobe airspace opacification is noted, and patchy central left-sided airspace opacity may also reflect pneumonia. Upper Abdomen: The visualized portions of the liver and spleen are grossly unremarkable. The visualized portions of the pancreas and adrenal glands are within normal limits. The patient is status post cholecystectomy, with clips noted at the gallbladder fossa. Musculoskeletal: No acute osseous abnormalities are identified. Posterior calcified disc protrusions are noted at the mid to lower thoracic spine. The chest wall is grossly unremarkable in appearance. IMPRESSION: 1. Small right and moderate left pleural effusions, with mild peripheral wall thickening at the right-sided pleural effusion, raising question for mild right-sided empyema. Would correlate for associated symptoms. 2. Dense right lower lobe airspace opacification, and patchy central left-sided airspace opacity, concerning for multifocal pneumonia. 3. Enlarged mediastinal nodes, measuring up to 2.0 cm at the subcarinal region. After completion of treatment for the patient's acute infection, follow-up  CT could be considered to assess for improvement in lymphadenopathy. 4. Diffuse coronary artery calcifications seen. 5. Scattered calcification along the thoracic aorta and proximal great vessels, with likely severe luminal narrowing at the proximal left subclavian artery. Electronically Signed   By: Garald Balding M.D.   On: 01/20/2016 00:49     Medications:     . azithromycin  500 mg Oral Daily  . dialysis solution 1.5% low-MG/low-CA   Intraperitoneal Q24H  . famotidine  20 mg Oral Daily  . gentamicin cream  1 application Topical Daily  . ipratropium-albuterol  3 mL Nebulization Q4H  . levETIRAcetam  500 mg Oral BID  . levothyroxine  50 mcg Oral QAC breakfast  . midodrine  5 mg Oral TID WC  . mupirocin ointment  1 application Nasal BID  . nicotine  7 mg Transdermal Daily  . pantoprazole  40 mg Oral BID AC  . piperacillin-tazobactam (ZOSYN)  IV  3.375 g Intravenous Q12H  . simvastatin  20 mg Oral QHS   acetaminophen **OR** acetaminophen, albuterol, guaiFENesin, heparin, ondansetron, oxyCODONE-acetaminophen, senna-docusate, zinc oxide  Assessment/ Plan:  60 y.o. female female with  CVA, Hypotension, diabetes mellitus type II, congestive heart failure, seizure disorder, hypothyroidism, DVT, presents 6/20/2017for Esophagitis [K20.9]  [facility] [address] Peritoneal dialysis  Prescription: CCPD with 6 exchanges 2 litre fills  1. ESRD with hyperkalemia: on peritoneal dialysis.   - doing well - resume home prescription, orders placed  2. Anemia of chronic kidney disease:  hemoglobin at goal - epo supplementation  3. Hypotension:   - midodrine supplementation   4. Hemoptysis Evaluated by pulmonary team Previously patient has opted for conservative management and nonaggressive approach.  5.  Secondary hyperparathyroidism Monitor phosphorus during hospital admission   LOS: 0 Deryn Massengale 8/8/20175:58 PM

## 2016-01-20 NOTE — Progress Notes (Signed)
Patient in respiratory distress this morning - improved with oxygen patient confused and keeps taking oxygen off On 3LNC maintain 95% - 73%RA Spoke with HCPOA sister -  [name] (first call [phone]  Second call [phone]) about her condition warned about potential need for intubation as well as risk of not being able to be weaned off vent.  hcpoa states well aware of risks and would want everything done no matter what  Hopefully patient will keep oxygen on and stabilize, otherwise plan icu transfer with intubation  Remains FULL code

## 2016-01-21 DIAGNOSIS — R918 Other nonspecific abnormal finding of lung field: Secondary | ICD-10-CM

## 2016-01-21 LAB — BASIC METABOLIC PANEL
Anion gap: 12 (ref 5–15)
BUN: 39 mg/dL — AB (ref 6–20)
CO2: 20 mmol/L — ABNORMAL LOW (ref 22–32)
CREATININE: 5.88 mg/dL — AB (ref 0.44–1.00)
Calcium: 8.3 mg/dL — ABNORMAL LOW (ref 8.9–10.3)
Chloride: 106 mmol/L (ref 101–111)
GFR calc Af Amer: 8 mL/min — ABNORMAL LOW (ref >60)
GFR, EST NON AFRICAN AMERICAN: 7 mL/min — AB (ref >60)
GLUCOSE: 122 mg/dL — AB (ref 65–99)
Potassium: 4.2 mmol/L (ref 3.5–5.1)
SODIUM: 138 mmol/L (ref 135–145)

## 2016-01-21 LAB — PHOSPHORUS: PHOSPHORUS: 7.8 mg/dL — AB (ref 2.5–4.6)

## 2016-01-21 LAB — CBC
HCT: 28.2 % — ABNORMAL LOW (ref 35.0–47.0)
Hemoglobin: 9.5 g/dL — ABNORMAL LOW (ref 12.0–16.0)
MCH: 29.5 pg (ref 26.0–34.0)
MCHC: 33.9 g/dL (ref 32.0–36.0)
MCV: 87 fL (ref 80.0–100.0)
PLATELETS: 437 10*3/uL (ref 150–440)
RBC: 3.24 MIL/uL — ABNORMAL LOW (ref 3.80–5.20)
RDW: 18 % — ABNORMAL HIGH (ref 11.5–14.5)
WBC: 11.4 10*3/uL — ABNORMAL HIGH (ref 3.6–11.0)

## 2016-01-21 LAB — HIV ANTIBODY (ROUTINE TESTING W REFLEX): HIV SCREEN 4TH GENERATION: NONREACTIVE

## 2016-01-21 MED ORDER — AMOXICILLIN-POT CLAVULANATE 875-125 MG PO TABS: 1.0000 | ORAL_TABLET | Freq: Two times a day (BID) | ORAL | 0 refills | Status: DC

## 2016-01-21 MED ORDER — MOMETASONE FURO-FORMOTEROL FUM 100-5 MCG/ACT IN AERO: 2.0000 | INHALATION_SPRAY | Freq: Two times a day (BID) | RESPIRATORY_TRACT | 0 refills | Status: DC

## 2016-01-21 MED ORDER — MOMETASONE FURO-FORMOTEROL FUM 100-5 MCG/ACT IN AERO
2.0000 | INHALATION_SPRAY | Freq: Two times a day (BID) | RESPIRATORY_TRACT | Status: DC
  Administered 2016-01-21 – 2016-01-22 (×3): 2 via RESPIRATORY_TRACT
  Filled 2016-01-21: qty 8.8

## 2016-01-21 MED ORDER — TIOTROPIUM BROMIDE MONOHYDRATE 18 MCG IN CAPS: 18.0000 ug | ORAL_CAPSULE | Freq: Every day | RESPIRATORY_TRACT | 12 refills | Status: AC

## 2016-01-21 MED ORDER — SENNOSIDES-DOCUSATE SODIUM 8.6-50 MG PO TABS
2.0000 | ORAL_TABLET | Freq: Two times a day (BID) | ORAL | Status: DC
  Administered 2016-01-21 – 2016-01-22 (×2): 2 via ORAL
  Filled 2016-01-21 (×2): qty 2

## 2016-01-21 MED ORDER — PREDNISONE 50 MG PO TABS
50.0000 mg | ORAL_TABLET | Freq: Every day | ORAL | Status: DC
  Administered 2016-01-22: 50 mg via ORAL
  Filled 2016-01-21: qty 1

## 2016-01-21 MED ORDER — TIOTROPIUM BROMIDE MONOHYDRATE 18 MCG IN CAPS
18.0000 ug | ORAL_CAPSULE | Freq: Every day | RESPIRATORY_TRACT | Status: DC
  Administered 2016-01-21 – 2016-01-22 (×2): 18 ug via RESPIRATORY_TRACT
  Filled 2016-01-21: qty 5

## 2016-01-21 NOTE — Care Management (Addendum)
Patient has qualifying O2 sats.  Plan was for patient to discharge today.  Patient has aid services at home Monday through Friday 4 hours a day, and Saturday and Sundays 6 hours.   [facility] notified that discharge was cancelled.  Dr. Lavetta Nielsen was notified call from [facility] provider [facility] [phone]

## 2016-01-21 NOTE — Progress Notes (Signed)
Patient HCPOA refuse to take patient home

## 2016-01-21 NOTE — Discharge Summary (Addendum)
[place] [facility] [place] NAME: Sherri Davidson    MR#:  [id number]  DATE OF BIRTH:  Oct 21, 1955  DATE OF ADMISSION:  01/19/2016 ADMITTING PHYSICIAN: Saundra Shelling, MD  DATE OF DISCHARGE: 01/21/16  PRIMARY CARE PHYSICIAN: [facility]    ADMISSION DIAGNOSIS:   pneumonia [J18.9] Peritoneal dialysis status ([place]) [Z99.2] Hemoptysis [R04.2]  DISCHARGE DIAGNOSIS:  Acute on chronic hypoxic respiratory failure Community-acquired pneumonia End-stage renal disease on peritoneal dialysis Right lung mass  SECONDARY DIAGNOSIS:   Past Medical History:  Diagnosis Date  . Blood clot in vein right hand  . Cancer ([place])    lung cancer  . CHF (congestive heart failure) ([place])   . CVA, old, hemiparesis ([place])   . Diabetes ([place])   . ESRD on peritoneal dialysis ([place])   . Hypertension   . Hypothyroidism   . Seizures ([place])   . Stroke [facility])     HOSPITAL COURSE:  Sherri Davidson  is a 60 y.o. female admitted 01/19/2016 with chief complaint Hemoptysis . Please see H&P performed by Saundra Shelling, MD for further information. Patient presented with the above symptoms with associated shortness of breath. She was noted to have respiratory distress with acute desaturations SaO2 in the low 70s on room air. Placed on continuation of antibiotics for pneumonia, underwent peritoneal dialysis, and placed on oxygen. Her oxygen saturations improved greatly with supplemental oxygen, mental status also improved greatly.  DISCHARGE CONDITIONS:   Stable  CONSULTS OBTAINED:  Treatment Team:  Murlean Iba, MD Flora Lipps, MD  DRUG ALLERGIES:   Allergies  Allergen Reactions  . Gabapentin Anaphylaxis and Other (See Comments)    Pt states she cant breathe  . Adhesive [Tape] Hives    Please use paper tape  . Salicylates Hives and Nausea Only    Uncoated.  Diona Fanti [Aspirin] Other (See Comments)    GI upset for non-enteric coated aspirin    DISCHARGE  MEDICATIONS:   Current Discharge Medication List    START taking these medications   Details  mometasone-formoterol (DULERA) 100-5 MCG/ACT AERO Inhale 2 puffs into the lungs 2 (two) times daily. Qty: 1 Inhaler, Refills: 0    tiotropium (SPIRIVA) 18 MCG inhalation capsule Place 1 capsule (18 mcg total) into inhaler and inhale daily. Qty: 30 capsule, Refills: 12      CONTINUE these medications which have CHANGED   Details  amoxicillin-clavulanate (AUGMENTIN) 875-125 MG tablet Take 1 tablet by mouth 2 (two) times daily. Qty: 14 tablet, Refills: 0      CONTINUE these medications which have NOT CHANGED   Details  albuterol (PROVENTIL HFA;VENTOLIN HFA) 108 (90 Base) MCG/ACT inhaler Inhale 2 puffs into the lungs every 6 (six) hours as needed for wheezing or shortness of breath. Qty: 1 Inhaler, Refills: 2    aspirin EC 81 MG tablet Take 1 tablet (81 mg total) by mouth daily. For heart protection    clopidogrel (PLAVIX) 75 MG tablet Take 1 tablet (75 mg total) by mouth daily. For heart protection    guaiFENesin (ROBITUSSIN) 100 MG/5ML SOLN Take 5 mLs (100 mg total) by mouth every 6 (six) hours as needed for cough or to loosen phlegm. Qty: 1200 mL, Refills: 0    levETIRAcetam (KEPPRA) 500 MG tablet Take 1 tablet (500 mg total) by mouth 2 (two) times daily. Qty: 60 tablet, Refills: 0    levothyroxine (SYNTHROID, LEVOTHROID) 50 MCG tablet Take 50 mcg by mouth daily. For hypothyroidism  loperamide (IMODIUM) 2 MG capsule Take 2-4 mg by mouth as needed for diarrhea or loose stools.    Maltodextrin-Xanthan Gum (RESOURCE THICKENUP CLEAR) POWD Use as per product instructions to thicken liquids to nectar thick consistency. Qty: 1 Can, Refills: 0    midodrine (PROAMATINE) 5 MG tablet Take 1 tablet (5 mg total) by mouth 3 (three) times daily with meals. Qty: 90 tablet, Refills: 0    multivitamin (RENA-VIT) TABS tablet Take 1 tablet by mouth at bedtime. Qty: 30 tablet, Refills: 0      mupirocin ointment (BACTROBAN) 2 % Place 1 application into the nose 2 (two) times daily. Qty: 1 g, Refills: 0    nicotine (NICODERM CQ - DOSED IN MG/24 HR) 7 mg/24hr patch Place 1 patch (7 mg total) onto the skin daily. Qty: 28 patch, Refills: 0    ondansetron (ZOFRAN ODT) 4 MG disintegrating tablet Take 1 tablet (4 mg total) by mouth every 8 (eight) hours as needed for nausea or vomiting. Qty: 20 tablet, Refills: 0    oxyCODONE-acetaminophen (PERCOCET/ROXICET) 5-325 MG tablet Take 1 tablet by mouth daily as needed for moderate pain or severe pain.    pantoprazole (PROTONIX) 40 MG tablet Take 1 tablet (40 mg total) by mouth 2 (two) times daily before a meal. Qty: 60 tablet, Refills: 0    potassium chloride SA (K-DUR,KLOR-CON) 20 MEQ tablet Take 20 mEq by mouth daily.     ranitidine (ZANTAC) 150 MG tablet Take 150 mg by mouth daily as needed for heartburn.    simvastatin (ZOCOR) 20 MG tablet Take 20 mg by mouth at bedtime.     Vitamin D, Ergocalciferol, (DRISDOL) 50000 units CAPS capsule Take 50,000 Units by mouth every 30 (thirty) days. On or about the 1st of each month    Zinc Oxide 13 % CREA Apply 1 application topically daily as needed (for barrier cream on buttocks while toileting).         DISCHARGE INSTRUCTIONS:    DIET:  Renal diet  DISCHARGE CONDITION:  Stable  ACTIVITY:  Activity as tolerated  OXYGEN:  Home Oxygen: Yes.     Oxygen Delivery: 3 liters/min via Patient connected to nasal cannula oxygen  DISCHARGE LOCATION:  home   If you experience worsening of your admission symptoms, develop shortness of breath, life threatening emergency, suicidal or homicidal thoughts you must seek medical attention immediately by calling 911 or calling your MD immediately  if symptoms less severe.  You Must read complete instructions/literature along with all the possible adverse reactions/side effects for all the Medicines you take and that have been prescribed to you.  Take any new Medicines after you have completely understood and accpet all the possible adverse reactions/side effects.   Please note  You were cared for by a hospitalist during your hospital stay. If you have any questions about your discharge medications or the care you received while you were in the hospital after you are discharged, you can call the unit and asked to speak with the hospitalist on call if the hospitalist that took care of you is not available. Once you are discharged, your primary care physician will handle any further medical issues. Please note that NO REFILLS for any discharge medications will be authorized once you are discharged, as it is imperative that you return to your primary care physician (or establish a relationship with a primary care physician if you do not have one) for your aftercare needs so that they can reassess your need for  medications and monitor your lab values.    On the day of Discharge:   VITAL SIGNS:  Blood pressure 109/80, pulse 94, temperature 97.9 F (36.6 C), temperature source Oral, resp. rate 18, height '5\' 6"'$  (1.676 m), weight 57.7 kg (127 lb 1.6 oz), SpO2 95 %.  I/O:   Intake/Output Summary (Last 24 hours) at 01/21/16 1105 Last data filed at 01/21/16 0840  Gross per 24 hour  Intake             76.9 ml  Output               86 ml  Net             -9.1 ml    PHYSICAL EXAMINATION:  GENERAL:  59 y.o.-year-old patient lying in the bed with no acute distress.  EYES: Pupils equal, round, reactive to light and accommodation. No scleral icterus. Extraocular muscles intact.  HEENT: Head atraumatic, normocephalic. Oropharynx and nasopharynx clear.  NECK:  Supple, no jugular venous distention. No thyroid enlargement, no tenderness.  LUNGS: Diminished breath sounds at lung bases no wheezing, rales,rhonchi or crepitation. No use of accessory muscles of respiration.  CARDIOVASCULAR: S1, S2 normal. No murmurs, rubs, or gallops.  ABDOMEN: Soft,  non-tender, non-distended. Bowel sounds present. No organomegaly or mass.  EXTREMITIES: No pedal edema, cyanosis, or clubbing.  NEUROLOGIC: Cranial nerves II through XII are intact. Muscle strength 5/5 in all extremities. Sensation intact. Gait not checked.  PSYCHIATRIC: The patient is alert and oriented x 3.  SKIN: No obvious rash, lesion, or ulcer.   DATA REVIEW:   CBC  Recent Labs Lab 01/21/16 0636  WBC 11.4*  HGB 9.5*  HCT 28.2*  PLT 437    Chemistries   Recent Labs Lab 01/20/16 0140  01/21/16 0636  NA 136  < > 138  K 5.3*  < > 4.2  CL 105  < > 106  CO2 19*  < > 20*  GLUCOSE 112*  < > 122*  BUN 41*  < > 39*  CREATININE 6.02*  < > 5.88*  CALCIUM 8.2*  < > 8.3*  AST 27  --   --   ALT 14  --   --   ALKPHOS 126  --   --   BILITOT 0.2*  --   --   < > = values in this interval not displayed.  Cardiac Enzymes  Recent Labs Lab 01/18/16 1902  TROPONINI 0.18*    Microbiology Results  Results for orders placed or performed during the hospital encounter of 01/18/16  MRSA PCR Screening     Status: Abnormal   Collection Time: 01/18/16  3:30 PM  Result Value Ref Range Status   MRSA by PCR POSITIVE (A) NEGATIVE Final    Comment:        The GeneXpert MRSA Assay (FDA approved for NASAL specimens only), is one component of a comprehensive MRSA colonization surveillance program. It is not intended to diagnose MRSA infection nor to guide or monitor treatment for MRSA infections. RESULT CALLED TO, READ BACK BY AND VERIFIED WITH: SUSAN PRESTO AT 1818 01/18/16.PMH     RADIOLOGY:  Dg Chest 2 View  Result Date: 01/19/2016 CLINICAL DATA:  Hemoptysis. Discharged from hospital today after being treated for pneumonia. EXAM: CHEST  2 VIEW COMPARISON:  Portable chest radiograph yesterday. Most recent chest CT 12/02/2015 FINDINGS: Chronic right lung base opacity with pleural effusion. Lower lung volumes from prior exam. There is increased bronchial thickening and left  basilar  atelectasis. Possible small left pleural effusion. Heart size and mediastinal contours are unchanged. No pneumothorax. Diffuse bony under mineralization. IMPRESSION: Increased bronchial thickening and left basilar atelectasis. Possible small left pleural effusion. Chronic right lower lobe opacity and pleural effusion. Electronically Signed   By: Jeb Levering M.D.   On: 01/19/2016 23:25   Ct Chest Wo Contrast  Result Date: 01/20/2016 CLINICAL DATA:  Acute onset of hemoptysis. Recent diagnosis of pneumonia. Initial encounter. EXAM: CT CHEST WITHOUT CONTRAST TECHNIQUE: Multidetector CT imaging of the chest was performed following the standard protocol without IV contrast. COMPARISON:  Chest radiograph performed earlier today at 11:09 p.m., and CT of the chest performed 12/02/2015 FINDINGS: Cardiovascular: Diffuse coronary artery calcifications are seen. Scattered calcification is noted along the thoracic aorta and proximal great vessels, with likely severe luminal narrowing at the proximal left subclavian artery. Mediastinum/Nodes: Enlarged mediastinal nodes are seen, measuring up to 2.0 cm at the subcarinal region. A 1.1 cm node is noted at the right paratracheal region. No pericardial effusion is identified. Lungs/Pleura: Small right and moderate left pleural effusions are noted. There is mild peripheral wall thickening with regard to the right-sided pleural effusion, raising question for mild right-sided empyema. Would correlate for associated symptoms. Dense right lower lobe airspace opacification is noted, and patchy central left-sided airspace opacity may also reflect pneumonia. Upper Abdomen: The visualized portions of the liver and spleen are grossly unremarkable. The visualized portions of the pancreas and adrenal glands are within normal limits. The patient is status post cholecystectomy, with clips noted at the gallbladder fossa. Musculoskeletal: No acute osseous abnormalities are identified.  Posterior calcified disc protrusions are noted at the mid to lower thoracic spine. The chest wall is grossly unremarkable in appearance. IMPRESSION: 1. Small right and moderate left pleural effusions, with mild peripheral wall thickening at the right-sided pleural effusion, raising question for mild right-sided empyema. Would correlate for associated symptoms. 2. Dense right lower lobe airspace opacification, and patchy central left-sided airspace opacity, concerning for multifocal pneumonia. 3. Enlarged mediastinal nodes, measuring up to 2.0 cm at the subcarinal region. After completion of treatment for the patient's acute infection, follow-up CT could be considered to assess for improvement in lymphadenopathy. 4. Diffuse coronary artery calcifications seen. 5. Scattered calcification along the thoracic aorta and proximal great vessels, with likely severe luminal narrowing at the proximal left subclavian artery. Electronically Signed   By: Garald Balding M.D.   On: 01/20/2016 00:49     Management plans discussed with the patient, family and they are in agreement.  CODE STATUS:     Code Status Orders        Start     Ordered   01/20/16 0430  Full code  Continuous     01/20/16 0429    Code Status History    Date Active Date Inactive Code Status Order ID Comments User Context   01/20/2016  3:48 AM 01/20/2016  4:29 AM Full Code [id number]  Saundra Shelling, MD ED   01/18/2016  5:18 AM 01/18/2016  7:00 AM Full Code [id number]  Saundra Shelling, MD Inpatient   12/02/2015  3:15 PM 12/03/2015  9:02 PM Full Code [id number]  Vaughan Basta, MD Inpatient   11/08/2015 10:33 PM 11/21/2015  5:53 PM Full Code [id number]  Norval Morton, MD Inpatient   08/17/2015  8:07 AM 08/18/2015  6:39 PM Full Code [id number]  Epifanio Lesches, MD ED   05/24/2015  3:42 AM 05/28/2015  5:34 PM Full Code [id number]  Pavan  Pyreddy, MD Inpatient      TOTAL TIME TAKING CARE OF THIS PATIENT: 32 minutes.    Hower,  Karenann Cai.D on 01/21/2016  at 11:05 AM  Between 7am to 6pm - Pager - [phone]  After 6pm go to [email or web address] - [employer] [facility]  Office  [phone]  CC: Primary care physician; [facility]

## 2016-01-21 NOTE — Progress Notes (Signed)
Subjective:  Patient was discharged 8/6 but returned for complains of coughing up small amount of blood Patient has been evaluated by pulmonary team At present, she feels back to baseline. No further hemoptysis No SOB PD was unremarkable Asking about going home    Objective:  Vital signs in last 24 hours:  Temp:  [97.5 F (36.4 C)-97.9 F (36.6 C)] 97.9 F (36.6 C) (08/09 0408) Pulse Rate:  [94-103] 94 (08/09 0408) Resp:  [18-22] 18 (08/09 0408) BP: (109-132)/(76-80) 109/80 (08/09 0408) SpO2:  [92 %-95 %] 95 % (08/09 0408) Weight:  [57.7 kg (127 lb 1.6 oz)] 57.7 kg (127 lb 1.6 oz) (08/09 0500)  Weight change: 0.953 kg (2 lb 1.6 oz) Filed Weights   01/19/16 2233 01/21/16 0500  Weight: 56.7 kg (125 lb) 57.7 kg (127 lb 1.6 oz)    Intake/Output:    Intake/Output Summary (Last 24 hours) at 01/21/16 1207 Last data filed at 01/21/16 0840  Gross per 24 hour  Intake             76.9 ml  Output               86 ml  Net             -9.1 ml     Physical Exam: General: No acute distress, sitting up in bed   HEENT Anicteric   Neck Supple   Pulm/lungs Coarse breath sounds, normal effort   CVS/Heart irregular rhythm   Abdomen:  Soft, nontender   Extremities: + dependent peripheral edema   Neurologic: Alert, oriented   Skin: Multiple excoriations   Access: PD catheter        Basic Metabolic Panel:   Recent Labs Lab 01/18/16 0718 01/19/16 0622 01/20/16 0140 01/20/16 0506 01/21/16 0636  NA 134* 135 136 137 138  K 5.5* 4.5 5.3* 4.9 4.2  CL 107 106 105 107 106  CO2 16* 19* 19* 18* 20*  GLUCOSE 117* 130* 112* 129* 122*  BUN 37* 35* 41* 41* 39*  CREATININE 6.32* 6.02* 6.02* 6.15* 5.88*  CALCIUM 8.4* 8.0* 8.2* 8.1* 8.3*  PHOS  --   --   --   --  7.8*     CBC:  Recent Labs Lab 01/18/16 0718 01/19/16 0622 01/20/16 0140 01/20/16 0506 01/21/16 0636  WBC 14.6* 9.5 10.6 11.7* 11.4*  NEUTROABS  --   --  8.3*  --   --   HGB 11.2* 10.4* 11.1* 11.1* 9.5*  HCT  33.8* 30.6* 33.2* 32.9* 28.2*  MCV 87.1 86.1 86.8 87.6 87.0  PLT 431 373 394 476* 437      Microbiology:  Recent Results (from the past 720 hour(s))  MRSA PCR Screening     Status: Abnormal   Collection Time: 01/18/16  3:30 PM  Result Value Ref Range Status   MRSA by PCR POSITIVE (A) NEGATIVE Final    Comment:        The GeneXpert MRSA Assay (FDA approved for NASAL specimens only), is one component of a comprehensive MRSA colonization surveillance program. It is not intended to diagnose MRSA infection nor to guide or monitor treatment for MRSA infections. RESULT CALLED TO, READ BACK BY AND VERIFIED WITH: SUSAN PRESTO AT 1818 01/18/16.PMH     Coagulation Studies: No results for input(s): LABPROT, INR in the last 72 hours.  Urinalysis: No results for input(s): COLORURINE, LABSPEC, PHURINE, GLUCOSEU, HGBUR, BILIRUBINUR, KETONESUR, PROTEINUR, UROBILINOGEN, NITRITE, LEUKOCYTESUR in the last 72 hours.  Invalid input(s): APPERANCEUR  Imaging: Dg Chest 2 View  Result Date: 01/19/2016 CLINICAL DATA:  Hemoptysis. Discharged from hospital today after being treated for pneumonia. EXAM: CHEST  2 VIEW COMPARISON:  Portable chest radiograph yesterday. Most recent chest CT 12/02/2015 FINDINGS: Chronic right lung base opacity with pleural effusion. Lower lung volumes from prior exam. There is increased bronchial thickening and left basilar atelectasis. Possible small left pleural effusion. Heart size and mediastinal contours are unchanged. No pneumothorax. Diffuse bony under mineralization. IMPRESSION: Increased bronchial thickening and left basilar atelectasis. Possible small left pleural effusion. Chronic right lower lobe opacity and pleural effusion. Electronically Signed   By: Jeb Levering M.D.   On: 01/19/2016 23:25   Ct Chest Wo Contrast  Result Date: 01/20/2016 CLINICAL DATA:  Acute onset of hemoptysis. Recent diagnosis of pneumonia. Initial encounter. EXAM: CT CHEST WITHOUT  CONTRAST TECHNIQUE: Multidetector CT imaging of the chest was performed following the standard protocol without IV contrast. COMPARISON:  Chest radiograph performed earlier today at 11:09 p.m., and CT of the chest performed 12/02/2015 FINDINGS: Cardiovascular: Diffuse coronary artery calcifications are seen. Scattered calcification is noted along the thoracic aorta and proximal great vessels, with likely severe luminal narrowing at the proximal left subclavian artery. Mediastinum/Nodes: Enlarged mediastinal nodes are seen, measuring up to 2.0 cm at the subcarinal region. A 1.1 cm node is noted at the right paratracheal region. No pericardial effusion is identified. Lungs/Pleura: Small right and moderate left pleural effusions are noted. There is mild peripheral wall thickening with regard to the right-sided pleural effusion, raising question for mild right-sided empyema. Would correlate for associated symptoms. Dense right lower lobe airspace opacification is noted, and patchy central left-sided airspace opacity may also reflect pneumonia. Upper Abdomen: The visualized portions of the liver and spleen are grossly unremarkable. The visualized portions of the pancreas and adrenal glands are within normal limits. The patient is status post cholecystectomy, with clips noted at the gallbladder fossa. Musculoskeletal: No acute osseous abnormalities are identified. Posterior calcified disc protrusions are noted at the mid to lower thoracic spine. The chest wall is grossly unremarkable in appearance. IMPRESSION: 1. Small right and moderate left pleural effusions, with mild peripheral wall thickening at the right-sided pleural effusion, raising question for mild right-sided empyema. Would correlate for associated symptoms. 2. Dense right lower lobe airspace opacification, and patchy central left-sided airspace opacity, concerning for multifocal pneumonia. 3. Enlarged mediastinal nodes, measuring up to 2.0 cm at the  subcarinal region. After completion of treatment for the patient's acute infection, follow-up CT could be considered to assess for improvement in lymphadenopathy. 4. Diffuse coronary artery calcifications seen. 5. Scattered calcification along the thoracic aorta and proximal great vessels, with likely severe luminal narrowing at the proximal left subclavian artery. Electronically Signed   By: Garald Balding M.D.   On: 01/20/2016 00:49     Medications:     . azithromycin  500 mg Oral Daily  . dialysis solution 1.5% low-MG/low-CA   Intraperitoneal Q24H  . epoetin (EPOGEN/PROCRIT) injection  10,000 Units Subcutaneous Weekly  . famotidine  20 mg Oral Daily  . gentamicin cream  1 application Topical Daily  . ipratropium-albuterol  3 mL Nebulization Q4H  . levETIRAcetam  500 mg Oral BID  . levothyroxine  50 mcg Oral QAC breakfast  . midodrine  5 mg Oral TID WC  . mometasone-formoterol  2 puff Inhalation BID  . mupirocin ointment  1 application Nasal BID  . nicotine  7 mg Transdermal Daily  . pantoprazole  40 mg Oral  BID AC  . piperacillin-tazobactam (ZOSYN)  IV  3.375 g Intravenous Q12H  . [START ON 01/22/2016] predniSONE  50 mg Oral Q breakfast  . simvastatin  20 mg Oral QHS  . tiotropium  18 mcg Inhalation Daily   acetaminophen **OR** acetaminophen, albuterol, guaiFENesin, heparin, ondansetron, oxyCODONE-acetaminophen, senna-docusate, zinc oxide  Assessment/ Plan:  60 y.o. female female with CVA, Hypotension, diabetes mellitus type II, congestive heart failure, seizure disorder, hypothyroidism, DVT, presents 6/20/2017for Esophagitis [K20.9]  [facility] [address] Peritoneal dialysis  Prescription: CCPD with 6 exchanges 2 L fills  1. ESRD with hyperkalemia: on peritoneal dialysis.   - doing well - resume home prescription   2. Anemia of chronic kidney disease:  hemoglobin at goal - epo supplementation  3. Hypotension:   - midodrine supplementation   4.  Hemoptysis Evaluated by pulmonary team Previously patient has opted for conservative management and nonaggressive approach.  5.  Secondary hyperparathyroidism Monitor phosphorus during hospital admission   LOS: 1 Henli Hey 8/9/201712:07 PM

## 2016-01-21 NOTE — Progress Notes (Signed)
SATURATION QUALIFICATIONS: (This note is used to comply with regulatory documentation for home oxygen)  Patient Saturations on Room Air at Rest = 92%  Patient Saturations on Room Air while Ambulating = 82%  Patient Saturations on 3 Liters of oxygen while Ambulating = 95%  Please briefly explain why patient needs home oxygen:

## 2016-01-21 NOTE — Progress Notes (Signed)
[place] [facility] [place] NAME: Sherri Davidson    MRN#:  [id number]  DATE OF BIRTH:  November 26, 1955  SUBJECTIVE:  Hospital Day: 1 day Sherri Davidson is a 60 y.o. female presenting with Hemoptysis .   Overnight events: No acute overnight events Interval Events: Rather remarkable recovery in both respiratory and mental status this morning denies complaints states "I want to go home"  REVIEW OF SYSTEMS:  REVIEW OF SYSTEMS:  CONSTITUTIONAL: Denies fevers, chills, fatigue, weakness.  EYES: Denies blurred vision, double vision, or eye pain.  EARS, NOSE, THROAT: Denies tinnitus, ear pain, hearing loss.  RESPIRATORY: Positive cough, shortness of breath, denies wheezing  CARDIOVASCULAR: Denies chest pain, palpitations, edema.  GASTROINTESTINAL: Denies nausea, vomiting, diarrhea, abdominal pain.  GENITOURINARY: Denies dysuria, hematuria.  ENDOCRINE: Denies nocturia or thyroid problems. HEMATOLOGIC AND LYMPHATIC: Denies easy bruising or bleeding.  SKIN: Denies rash or lesions.  MUSCULOSKELETAL: Denies pain in neck, back, shoulder, knees, hips, or further arthritic symptoms.  NEUROLOGIC: Denies paralysis, paresthesias.  PSYCHIATRIC: Denies anxiety or depressive symptoms. Otherwise full review of systems performed by me is negative.  DRUG ALLERGIES:   Allergies  Allergen Reactions  . Gabapentin Anaphylaxis and Other (See Comments)    Pt states she cant breathe  . Adhesive [Tape] Hives    Please use paper tape  . Salicylates Hives and Nausea Only    Uncoated.  Diona Fanti [Aspirin] Other (See Comments)    GI upset for non-enteric coated aspirin    VITALS:  Blood pressure 109/80, pulse 94, temperature 97.9 F (36.6 C), temperature source Oral, resp. rate 18, height 5' 6"  (1.676 m), weight 57.7 kg (127 lb 1.6 oz), SpO2 95 %.  PHYSICAL EXAMINATION:   VITAL SIGNS: Vitals:   01/20/16 2144 01/21/16 0408  BP: 132/76 109/80  Pulse: (!) 103 94  Resp:  (!) 22 18  Temp: 97.5 F (36.4 C) 97.9 F (36.6 C)   GENERAL:59 y.o.female currently in no acute distress.  HEAD: Normocephalic, atraumatic.  EYES: Pupils equal, round, reactive to light. Extraocular muscles intact. No scleral icterus.  MOUTH: Moist mucosal membrane. Dentition intact. No abscess noted.  EAR, NOSE, THROAT: Clear without exudates. No external lesions.  NECK: Supple. No thyromegaly. No nodules. No JVD.  PULMONARY: Scant rhonchi without wheeze  No use of accessory muscles, Good respiratory effort. good air entry bilaterally CHEST: Nontender to palpation.  CARDIOVASCULAR: S1 and S2. Regular rate and rhythm. No murmurs, rubs, or gallops. No edema. Pedal pulses 2+ bilaterally.  GASTROINTESTINAL: Soft, nontender, nondistended. No masses. Positive bowel sounds. No hepatosplenomegaly.  MUSCULOSKELETAL: No swelling, clubbing, or edema. Range of motion full in all extremities.  NEUROLOGIC: Cranial nerves II through XII are intact. No gross focal neurological deficits. Sensation intact. Reflexes intact.  SKIN: No ulceration, lesions, rashes, or cyanosis. Skin warm and dry. Turgor intact.  PSYCHIATRIC: Mood, affect within normal limits. The patient is awake, alert and oriented x 3. Insight, judgment intact.        LABORATORY PANEL:   CBC  Recent Labs Lab 01/21/16 0636  WBC 11.4*  HGB 9.5*  HCT 28.2*  PLT 437   ------------------------------------------------------------------------------------------------------------------  Chemistries   Recent Labs Lab 01/20/16 0140  01/21/16 0636  NA 136  < > 138  K 5.3*  < > 4.2  CL 105  < > 106  CO2 19*  < > 20*  GLUCOSE 112*  < > 122*  BUN 41*  < > 39*  CREATININE 6.02*  < >  5.88*  CALCIUM 8.2*  < > 8.3*  AST 27  --   --   ALT 14  --   --   ALKPHOS 126  --   --   BILITOT 0.2*  --   --   < > = values in this interval not  displayed. ------------------------------------------------------------------------------------------------------------------  Cardiac Enzymes  Recent Labs Lab 01/18/16 1902  TROPONINI 0.18*   ------------------------------------------------------------------------------------------------------------------  RADIOLOGY:  Dg Chest 2 View  Result Date: 01/19/2016 CLINICAL DATA:  Hemoptysis. Discharged from hospital today after being treated for pneumonia. EXAM: CHEST  2 VIEW COMPARISON:  Portable chest radiograph yesterday. Most recent chest CT 12/02/2015 FINDINGS: Chronic right lung base opacity with pleural effusion. Lower lung volumes from prior exam. There is increased bronchial thickening and left basilar atelectasis. Possible small left pleural effusion. Heart size and mediastinal contours are unchanged. No pneumothorax. Diffuse bony under mineralization. IMPRESSION: Increased bronchial thickening and left basilar atelectasis. Possible small left pleural effusion. Chronic right lower lobe opacity and pleural effusion. Electronically Signed   By: Jeb Levering M.D.   On: 01/19/2016 23:25   Ct Chest Wo Contrast  Result Date: 01/20/2016 CLINICAL DATA:  Acute onset of hemoptysis. Recent diagnosis of pneumonia. Initial encounter. EXAM: CT CHEST WITHOUT CONTRAST TECHNIQUE: Multidetector CT imaging of the chest was performed following the standard protocol without IV contrast. COMPARISON:  Chest radiograph performed earlier today at 11:09 p.m., and CT of the chest performed 12/02/2015 FINDINGS: Cardiovascular: Diffuse coronary artery calcifications are seen. Scattered calcification is noted along the thoracic aorta and proximal great vessels, with likely severe luminal narrowing at the proximal left subclavian artery. Mediastinum/Nodes: Enlarged mediastinal nodes are seen, measuring up to 2.0 cm at the subcarinal region. A 1.1 cm node is noted at the right paratracheal region. No pericardial effusion  is identified. Lungs/Pleura: Small right and moderate left pleural effusions are noted. There is mild peripheral wall thickening with regard to the right-sided pleural effusion, raising question for mild right-sided empyema. Would correlate for associated symptoms. Dense right lower lobe airspace opacification is noted, and patchy central left-sided airspace opacity may also reflect pneumonia. Upper Abdomen: The visualized portions of the liver and spleen are grossly unremarkable. The visualized portions of the pancreas and adrenal glands are within normal limits. The patient is status post cholecystectomy, with clips noted at the gallbladder fossa. Musculoskeletal: No acute osseous abnormalities are identified. Posterior calcified disc protrusions are noted at the mid to lower thoracic spine. The chest wall is grossly unremarkable in appearance. IMPRESSION: 1. Small right and moderate left pleural effusions, with mild peripheral wall thickening at the right-sided pleural effusion, raising question for mild right-sided empyema. Would correlate for associated symptoms. 2. Dense right lower lobe airspace opacification, and patchy central left-sided airspace opacity, concerning for multifocal pneumonia. 3. Enlarged mediastinal nodes, measuring up to 2.0 cm at the subcarinal region. After completion of treatment for the patient's acute infection, follow-up CT could be considered to assess for improvement in lymphadenopathy. 4. Diffuse coronary artery calcifications seen. 5. Scattered calcification along the thoracic aorta and proximal great vessels, with likely severe luminal narrowing at the proximal left subclavian artery. Electronically Signed   By: Garald Balding M.D.   On: 01/20/2016 00:49    EKG:   Orders placed or performed during the hospital encounter of 01/18/16  . EKG 12-Lead  . EKG 12-Lead    ASSESSMENT AND PLAN:   Sharmel Ballantine is a 60 y.o. female presenting with Hemoptysis . Admitted 01/19/2016 :  Day #: 1 day 1. Acute on chronic hypoxic respiratory failure: Multifactorial setting of right lower lobe cancer, pneumonia, atelectasis, pleural effusion: Vancomycin/Zosyn for antibiotic coverage follow culture data, and breathing treatments, supplemental oxygen, given this level of recovery anticipated discharge this morning, however after patient met with sister [facility]) now refusal to wear oxygen at home and has taken oxygen off in hospital. Obviously desaturated shortly thereafter. Oxygen back in place, but seems to be some turmoil between both parties - given her new refusal to wear oxygen (different plan from this morning) will have to hold discharge - palliative care consult  2. Hypothyroidism unspecified: Synthroid 3. Hyperlipidemia unspecified: Statin therapy 4. GERD without esophagitis: PPI therapy  Case discussed with patient healthcare power of attorney Sherri Davidson  All the records are reviewed and case discussed with Care Management/Social Workerr. Management plans discussed with the patient, family and they are in agreement.  CODE STATUS: full TOTAL TIME TAKING CARE OF THIS PATIENT: 60 minutes.   POSSIBLE D/C IN 2-3DAYS, DEPENDING ON CLINICAL CONDITION.   Jaymir Struble,  Karenann Cai.D on 01/21/2016 at 1:51 PM  Between 7am to 6pm - Pager - [phone]  After 6pm: House Pager: - [phone]  [facility]  Office  [phone]  CC: Primary care physician; [facility]

## 2016-01-21 NOTE — Consult Note (Signed)
Name: Sherri Davidson MRN: [id number] DOB: Jan 16, 1956    ADMISSION DATE:  01/19/2016 CONSULTATION DATE:  01/20/2016  REFERRING MD :  Dr. Lavetta Nielsen  CHIEF COMPLAINT:  Hemoptysis   BRIEF PATIENT DESCRIPTION:  This is a 60 yo female who presents to [facility] ER with c/o hemoptysis she was recently discharged 8/6 with a diagnosis of pneumonia and was discharged on oral antibiotics.    SIGNIFICANT EVENTS  8/7-Pt discharged from [facility] with diagnosis of pneumonia and discharged on oral antibiotics. 8/8-Pt presents to [facility] ER with c/o hemoptysis therefore she was admitted to [facility] 8/8-PCCM consulted for management of hemoptysis  STUDIES:  CT of chest 8/8>>Small right and moderate left pleural effusions, with mild peripheral wall thickening at the right-sided pleural effusion, raising question for mild right-sided empyema. Would correlate for associated symptoms. Dense right lower lobe airspace opacification, and patchy central left-sided airspace opacity, concerning for multifocal pneumonia. Enlarged mediastinal nodes, measuring up to 2.0 cm at the subcarinal region. After completion of treatment for the patient's acute infection, follow-up CT could be considered to assess for improvement in lymphadenopathy. Diffuse coronary artery calcifications seen. Scattered calcification along the thoracic aorta and proximal great vessels, with likely severe luminal narrowing at the proximal left subclavian artery. 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 She presented to [facility] ER 8/7 she was discharged from the hospital 8/6 with pneumonia and discharged on oral antibiotics, however after she went home she had 2 episodes of scant amount of hemoptysis on 8/6 therefore prompting her to return to the ER.  Upon arrival to the ER on 8/7 CT of chest was  performed that showed multifocal pneumonia and possible right lung empyema.  She was started on broad spectrum antibiotics in the ER.  According to pts daughter in April 2017 she was told a CT of chest showed an right lung mass, however at that time the pt declined bronchoscopy for biopsy.  PCCM consulted on 8/8 due to hemoptysis.  REVIEW OF SYSTEMS:  Positives in BOLD Constitutional: Negative for fever, chills, weight loss, malaise/fatigue and diaphoresis.  HENT: Negative for hearing loss, ear pain, nosebleeds, congestion, sore throat, neck pain, tinnitus and ear discharge.   Eyes: Negative for blurred vision, double vision, photophobia, pain, discharge and redness.  Respiratory: cough, hemoptysis, sputum production, shortness of breath, wheezing and stridor.   Cardiovascular: Negative for chest pain, palpitations, orthopnea, claudication, leg swelling and PND.  Gastrointestinal: Negative for heartburn, nausea, vomiting, abdominal pain, diarrhea, constipation, blood in stool and melena.  Genitourinary: Negative for dysuria, urgency, frequency, hematuria and flank pain.  Musculoskeletal: Negative for myalgias, back pain, joint pain and falls.  Skin: Negative for itching and rash.  Neurological: dysphagia, dizziness, tingling, tremors, sensory change, speech change, focal weakness, seizures, loss of consciousness, weakness and headaches.  Endo/Heme/Allergies: Negative for environmental allergies and polydipsia. Does not bruise/bleed easily.  SUBJECTIVE:  Pt currently states she is not short of breath on 3L O2 via nasal cannula.  She has had no episodes of hemoptysis since admission 8/7. Sitting up comfortably eating breakfast  VITAL SIGNS: Temp:  [97.5 F (36.4 C)-97.9 F (36.6 C)] 97.9 F (36.6 C) (08/09 0408) Pulse Rate:  [94-103] 94 (08/09 0408) Resp:  [18-22] 18 (08/09 0408) BP: (109-132)/(76-80) 109/80 (08/09 0408) SpO2:  [92 %-95 %] 95 % (08/09  0408) Weight:  [127 lb 1.6 oz (57.7  kg)] 127 lb 1.6 oz (57.7 kg) (08/09 0500)  PHYSICAL EXAMINATION: General:  Cachectic Neuro:  Alert confused to situation at times, follows commands HEENT: supple, no JVD Cardiovascular:  S12, rrr, no M/R/G Lungs:  Diminished throughout, mildly tachypnic, no wheezing, rales or rhonch Abdomen:  +BS x4, soft, non tender, non distended Musculoskeletal:  Normal bulk and tone Skin:  Intact, no rashes or lesions   Recent Labs Lab 01/20/16 0140 01/20/16 0506 01/21/16 0636  NA 136 137 138  K 5.3* 4.9 4.2  CL 105 107 106  CO2 19* 18* 20*  BUN 41* 41* 39*  CREATININE 6.02* 6.15* 5.88*  GLUCOSE 112* 129* 122*    Recent Labs Lab 01/20/16 0140 01/20/16 0506 01/21/16 0636  HGB 11.1* 11.1* 9.5*  HCT 33.2* 32.9* 28.2*  WBC 10.6 11.7* 11.4*  PLT 394 476* 437   Dg Chest 2 View  Result Date: 01/19/2016 CLINICAL DATA:  Hemoptysis. Discharged from hospital today after being treated for pneumonia. EXAM: CHEST  2 VIEW COMPARISON:  Portable chest radiograph yesterday. Most recent chest CT 12/02/2015 FINDINGS: Chronic right lung base opacity with pleural effusion. Lower lung volumes from prior exam. There is increased bronchial thickening and left basilar atelectasis. Possible small left pleural effusion. Heart size and mediastinal contours are unchanged. No pneumothorax. Diffuse bony under mineralization. IMPRESSION: Increased bronchial thickening and left basilar atelectasis. Possible small left pleural effusion. Chronic right lower lobe opacity and pleural effusion. Electronically Signed   By: Jeb Levering M.D.   On: 01/19/2016 23:25   Ct Chest Wo Contrast  Result Date: 01/20/2016 CLINICAL DATA:  Acute onset of hemoptysis. Recent diagnosis of pneumonia. Initial encounter. EXAM: CT CHEST WITHOUT CONTRAST TECHNIQUE: Multidetector CT imaging of the chest was performed following the standard protocol without IV contrast. COMPARISON:  Chest radiograph performed earlier today at 11:09 p.m., and CT of  the chest performed 12/02/2015 FINDINGS: Cardiovascular: Diffuse coronary artery calcifications are seen. Scattered calcification is noted along the thoracic aorta and proximal great vessels, with likely severe luminal narrowing at the proximal left subclavian artery. Mediastinum/Nodes: Enlarged mediastinal nodes are seen, measuring up to 2.0 cm at the subcarinal region. A 1.1 cm node is noted at the right paratracheal region. No pericardial effusion is identified. Lungs/Pleura: Small right and moderate left pleural effusions are noted. There is mild peripheral wall thickening with regard to the right-sided pleural effusion, raising question for mild right-sided empyema. Would correlate for associated symptoms. Dense right lower lobe airspace opacification is noted, and patchy central left-sided airspace opacity may also reflect pneumonia. Upper Abdomen: The visualized portions of the liver and spleen are grossly unremarkable. The visualized portions of the pancreas and adrenal glands are within normal limits. The patient is status post cholecystectomy, with clips noted at the gallbladder fossa. Musculoskeletal: No acute osseous abnormalities are identified. Posterior calcified disc protrusions are noted at the mid to lower thoracic spine. The chest wall is grossly unremarkable in appearance. IMPRESSION: 1. Small right and moderate left pleural effusions, with mild peripheral wall thickening at the right-sided pleural effusion, raising question for mild right-sided empyema. Would correlate for associated symptoms. 2. Dense right lower lobe airspace opacification, and patchy central left-sided airspace opacity, concerning for multifocal pneumonia. 3. Enlarged mediastinal nodes, measuring up to 2.0 cm at the subcarinal region. After completion of treatment for the patient's acute infection, follow-up CT could be considered to assess for improvement in lymphadenopathy. 4. Diffuse coronary artery calcifications seen.  5.  Scattered calcification along the thoracic aorta and proximal great vessels, with likely severe luminal narrowing at the proximal left subclavian artery. Electronically Signed   By: Garald Balding M.D.   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  There is an increase in size to the right sided mass/atelectasis.   Acute Hemoptysis/mild COPD exacerbation -- likely due to multiple underlying lung issues while on plavix.  --Continue to monitor, if the patient continues to have hemoptysis, then plavix will need to be discontinued.  -recommend oral prednisone 40 mg daily for 10 days -start spiriva and dulera -continue abx 5-7 days total enough  Right lung mass/atelectasis.  --There is increasing size of the patient's mass-- this could represent mass vs. Organizing pneumonia vs. Post obstructive pneumonia from endobrochial or peri-bronchial lesion. 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  --Discussed with PoA that patient would require bronchoscopy for this, but will be high risk given history of seizure, CVA, Systolic CHF, debility, chronic respiratory failure, and ESRD. Also given all of these problems, she may not be a candidate for chemotherapy.  --Would suggest a palliative care consult, per poa, the patient appears to have a poor grasp on her multiple conditions and I am doubtful that she can make realistic medical decisions.  --Should they opt for an intervention would need neurology consult to evaluate risk of stopping plavix for one week as well as cardiac  clearance.   Right loculated effusion, chronic. 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  --Unchanged since April of 2017, given her numerous medical problems, this does not need intervention at this time.   Left Pleural effusion.  --Likely due to volume overload from ESRD and chronic systolic CHF with HD=62%.  --Continue PD, pt may require HD.  --IR consult for thoracentesis if dyspnea progresses.   Palliative care fconsulted. Awaiting Neuro and Cardiac clearance if patient/POA wish to pursue procedure   The Patient requires high complexity decision making for assessment and support, frequent evaluation and titration of therapies.     Corrin Parker, M.D.  [facility]  Medical Director [place] Director [facility] Cardio-Pulmonary Department

## 2016-01-22 DIAGNOSIS — N186 End stage renal disease: Secondary | ICD-10-CM

## 2016-01-22 DIAGNOSIS — Z789 Other specified health status: Secondary | ICD-10-CM

## 2016-01-22 DIAGNOSIS — Z515 Encounter for palliative care: Secondary | ICD-10-CM

## 2016-01-22 DIAGNOSIS — Z7189 Other specified counseling: Secondary | ICD-10-CM

## 2016-01-22 DIAGNOSIS — Z992 Dependence on renal dialysis: Secondary | ICD-10-CM

## 2016-01-22 MED ORDER — PREDNISONE 10 MG PO TABS: ORAL_TABLET | ORAL | 0 refills | Status: DC

## 2016-01-22 MED ORDER — HEPARIN 1000 UNIT/ML FOR PERITONEAL DIALYSIS
500.0000 [IU] | INTRAMUSCULAR | Status: DC | PRN
  Filled 2016-01-22: qty 0.5

## 2016-01-22 MED ORDER — DELFLEX-LC/1.5% DEXTROSE 346 MOSM/L IP SOLN
INTRAPERITONEAL | Status: DC
  Filled 2016-01-22: qty 3000

## 2016-01-22 MED ORDER — GENTAMICIN SULFATE 0.1 % EX CREA
1.0000 "application " | TOPICAL_CREAM | Freq: Every day | CUTANEOUS | Status: DC
  Filled 2016-01-22 (×2): qty 15

## 2016-01-22 MED ORDER — AZITHROMYCIN 250 MG PO TABS: ORAL_TABLET | ORAL | 0 refills | Status: DC

## 2016-01-22 MED ORDER — AMOXICILLIN-POT CLAVULANATE 875-125 MG PO TABS: 1.0000 | ORAL_TABLET | Freq: Two times a day (BID) | ORAL | 0 refills | Status: DC

## 2016-01-22 MED ORDER — LEVOFLOXACIN 250 MG PO TABS: 250.0000 mg | ORAL_TABLET | Freq: Every day | ORAL | Status: DC

## 2016-01-22 MED ORDER — AMOXICILLIN-POT CLAVULANATE 875-125 MG PO TABS
1.0000 | ORAL_TABLET | Freq: Two times a day (BID) | ORAL | Status: DC
  Administered 2016-01-22: 1 via ORAL
  Filled 2016-01-22: qty 1

## 2016-01-22 NOTE — Discharge Summary (Addendum)
[facility] [place] NAME: Sherri Davidson    MR#:  [id number]  DATE OF BIRTH:  1955-12-22  DATE OF ADMISSION:  01/19/2016 ADMITTING PHYSICIAN: Saundra Shelling, MD  DATE OF DISCHARGE: 01/22/16  PRIMARY CARE PHYSICIAN: [facility]    ADMISSION DIAGNOSIS:  Healthcare-associated pneumonia [J18.9] Peritoneal dialysis status ([place]) [Z99.2] Hemoptysis [R04.2]  DISCHARGE DIAGNOSIS:  Acute on chronic respiratory failure due to Pneumonia ESRD on PD Lung mass -know history  SECONDARY DIAGNOSIS:   Past Medical History:  Diagnosis Date  . Blood clot in vein right hand  . Cancer ([place])    lung cancer  . CHF (congestive heart failure) ([place])   . CVA, old, hemiparesis ([place])   . Diabetes ([place])   . ESRD on peritoneal dialysis ([place])   . Hypertension   . Hypothyroidism   . Seizures ([place])   . Stroke [facility])     HOSPITAL COURSE:   Sherri Davidson is a 60 y.o. female presenting with Hemoptysis 1. Acute on chronic hypoxic respiratory failure: Multifactorial setting of right lower lobe cancer, pneumonia, atelectasis, pleural effusion: -pt was o  Vancomycin/Zosyn for antibiotic coverage follow culture data, and breathing treatments, supplemental oxygen, given this level of recovery anticipated discharge this morning, however after patient met with sister [facility]) now refusal to wear oxygen at home and has taken oxygen off in hospital. Obviously desaturated shortly thereafter. Oxygen back in place -seen by Dr Mortimer Fries -recommends abx for few more days. Hemoptysis resolved. Pt aslo n plavix -pt will f/u Pulmonary as  Out pt -on po augmentin now for 7 days(has meds at home per sister) -pt will now be discharged on home oxygen  2. Hypothyroidism unspecified: Synthroid  3. Hyperlipidemia unspecified: Statin therapy  4. GERD without esophagitis: PPI therapy  5.ESRD on PD Pt 's sister helps with the PD  Palliative care saw pt. She  remains a FULL CODE. Plan to to f/u pulmonary as out pt after /dc  CONSULTS OBTAINED:  Treatment Team:  Murlean Iba, MD Flora Lipps, MD  DRUG ALLERGIES:   Allergies  Allergen Reactions  . Gabapentin Anaphylaxis and Other (See Comments)    Pt states she cant breathe  . Adhesive [Tape] Hives    Please use paper tape  . Salicylates Hives and Nausea Only    Uncoated.  Diona Fanti [Aspirin] Other (See Comments)    GI upset for non-enteric coated aspirin    DISCHARGE MEDICATIONS:   Current Discharge Medication List    START taking these medications   Details  azithromycin (ZITHROMAX) 250 MG tablet Take as directed Qty: 4 each, Refills: 0    mometasone-formoterol (DULERA) 100-5 MCG/ACT AERO Inhale 2 puffs into the lungs 2 (two) times daily. Qty: 1 Inhaler, Refills: 0    predniSONE (DELTASONE) 10 MG tablet Start with 50 mg daily and taper by 10 mg daily then stop Qty: 15 tablet, Refills: 0    tiotropium (SPIRIVA) 18 MCG inhalation capsule Place 1 capsule (18 mcg total) into inhaler and inhale daily. Qty: 30 capsule, Refills: 12      CONTINUE these medications which have NOT CHANGED   Details  albuterol (PROVENTIL HFA;VENTOLIN HFA) 108 (90 Base) MCG/ACT inhaler Inhale 2 puffs into the lungs every 6 (six) hours as needed for wheezing or shortness of breath. Qty: 1 Inhaler, Refills: 2    aspirin EC 81 MG tablet Take 1 tablet (81 mg total) by mouth daily. For heart protection  clopidogrel (PLAVIX) 75 MG tablet Take 1 tablet (75 mg total) by mouth daily. For heart protection    guaiFENesin (ROBITUSSIN) 100 MG/5ML SOLN Take 5 mLs (100 mg total) by mouth every 6 (six) hours as needed for cough or to loosen phlegm. Qty: 1200 mL, Refills: 0    levETIRAcetam (KEPPRA) 500 MG tablet Take 1 tablet (500 mg total) by mouth 2 (two) times daily. Qty: 60 tablet, Refills: 0    levothyroxine (SYNTHROID, LEVOTHROID) 50 MCG tablet Take 50 mcg by mouth daily. For hypothyroidism    loperamide  (IMODIUM) 2 MG capsule Take 2-4 mg by mouth as needed for diarrhea or loose stools.    Maltodextrin-Xanthan Gum (RESOURCE THICKENUP CLEAR) POWD Use as per product instructions to thicken liquids to nectar thick consistency. Qty: 1 Can, Refills: 0    midodrine (PROAMATINE) 5 MG tablet Take 1 tablet (5 mg total) by mouth 3 (three) times daily with meals. Qty: 90 tablet, Refills: 0    multivitamin (RENA-VIT) TABS tablet Take 1 tablet by mouth at bedtime. Qty: 30 tablet, Refills: 0    mupirocin ointment (BACTROBAN) 2 % Place 1 application into the nose 2 (two) times daily. Qty: 1 g, Refills: 0    nicotine (NICODERM CQ - DOSED IN MG/24 HR) 7 mg/24hr patch Place 1 patch (7 mg total) onto the skin daily. Qty: 28 patch, Refills: 0    ondansetron (ZOFRAN ODT) 4 MG disintegrating tablet Take 1 tablet (4 mg total) by mouth every 8 (eight) hours as needed for nausea or vomiting. Qty: 20 tablet, Refills: 0    oxyCODONE-acetaminophen (PERCOCET/ROXICET) 5-325 MG tablet Take 1 tablet by mouth daily as needed for moderate pain or severe pain.    pantoprazole (PROTONIX) 40 MG tablet Take 1 tablet (40 mg total) by mouth 2 (two) times daily before a meal. Qty: 60 tablet, Refills: 0    potassium chloride SA (K-DUR,KLOR-CON) 20 MEQ tablet Take 20 mEq by mouth daily.     ranitidine (ZANTAC) 150 MG tablet Take 150 mg by mouth daily as needed for heartburn.    simvastatin (ZOCOR) 20 MG tablet Take 20 mg by mouth at bedtime.     Vitamin D, Ergocalciferol, (DRISDOL) 50000 units CAPS capsule Take 50,000 Units by mouth every 30 (thirty) days. On or about the 1st of each month    Zinc Oxide 13 % CREA Apply 1 application topically daily as needed (for barrier cream on buttocks while toileting).      STOP taking these medications     amoxicillin-clavulanate (AUGMENTIN) 875-125 MG tablet         If you experience worsening of your admission symptoms, develop shortness of breath, life threatening emergency,  suicidal or homicidal thoughts you must seek medical attention immediately by calling 911 or calling your MD immediately  if symptoms less severe.  You Must read complete instructions/literature along with all the possible adverse reactions/side effects for all the Medicines you take and that have been prescribed to you. Take any new Medicines after you have completely understood and accept all the possible adverse reactions/side effects.   Please note  You were cared for by a hospitalist during your hospital stay. If you have any questions about your discharge medications or the care you received while you were in the hospital after you are discharged, you can call the unit and asked to speak with the hospitalist on call if the hospitalist that took care of you is not available. Once you are discharged, your  primary care physician will handle any further medical issues. Please note that NO REFILLS for any discharge medications will be authorized once you are discharged, as it is imperative that you return to your primary care physician (or establish a relationship with a primary care physician if you do not have one) for your aftercare needs so that they can reassess your need for medications and monitor your lab values. Today   SUBJECTIVE   I want to go home  VITAL SIGNS:  Blood pressure 107/71, pulse 94, temperature 98.6 F (37 C), resp. rate (!) 26, height 5' 6"  (1.676 m), weight 57.7 kg (127 lb 1.6 oz), SpO2 96 %.  I/O:  No intake or output data in the 24 hours ending 01/22/16 1141  PHYSICAL EXAMINATION:  GENERAL:  60 y.o.-year-old patient lying in the bed with no acute distress.  EYES: Pupils equal, round, reactive to light and accommodation. No scleral icterus. Extraocular muscles intact.  HEENT: Head atraumatic, normocephalic. Oropharynx and nasopharynx clear. edentulous NECK:  Supple, no jugular venous distention. No thyroid enlargement, no tenderness.  LUNGS: Normal breath sounds  bilaterally, no wheezing, rales,rhonchi or crepitation. No use of accessory muscles of respiration.  CARDIOVASCULAR: S1, S2 normal. No murmurs, rubs, or gallops.  ABDOMEN: Soft, non-tender, non-distended. Bowel sounds present. No organomegaly or mass.  EXTREMITIES: No pedal edema, cyanosis, or clubbing.  NEUROLOGIC: Cranial nerves II through XII are intact. Muscle strength 5/5 in all extremities. Sensation intact. Gait not checked.  PSYCHIATRIC: The patient is alert and oriented x 3.  SKIN: No obvious rash, lesion, or ulcer.   DATA REVIEW:   CBC   Recent Labs Lab 01/21/16 0636  WBC 11.4*  HGB 9.5*  HCT 28.2*  PLT 437    Chemistries   Recent Labs Lab 01/20/16 0140  01/21/16 0636  NA 136  < > 138  K 5.3*  < > 4.2  CL 105  < > 106  CO2 19*  < > 20*  GLUCOSE 112*  < > 122*  BUN 41*  < > 39*  CREATININE 6.02*  < > 5.88*  CALCIUM 8.2*  < > 8.3*  AST 27  --   --   ALT 14  --   --   ALKPHOS 126  --   --   BILITOT 0.2*  --   --   < > = values in this interval not displayed.  Microbiology Results   Recent Results (from the past 240 hour(s))  MRSA PCR Screening     Status: Abnormal   Collection Time: 01/18/16  3:30 PM  Result Value Ref Range Status   MRSA by PCR POSITIVE (A) NEGATIVE Final    Comment:        The GeneXpert MRSA Assay (FDA approved for NASAL specimens only), is one component of a comprehensive MRSA colonization surveillance program. It is not intended to diagnose MRSA infection nor to guide or monitor treatment for MRSA infections. RESULT CALLED TO, READ BACK BY AND VERIFIED WITH: SUSAN PRESTO AT 1818 01/18/16.PMH   Blood culture (routine x 2)     Status: None (Preliminary result)   Collection Time: 01/20/16  1:39 AM  Result Value Ref Range Status   Specimen Description BLOOD LEFT HAND  Final   Special Requests BOTTLES DRAWN AEROBIC AND ANAEROBIC 5CC  Final   Culture NO GROWTH 2 DAYS  Final   Report Status PENDING  Incomplete  Blood culture  (routine x 2)     Status: None (Preliminary  result)   Collection Time: 01/20/16  1:39 AM  Result Value Ref Range Status   Specimen Description BLOOD LEFT ARM  Final   Special Requests BOTTLES DRAWN AEROBIC AND ANAEROBIC 5CC  Final   Culture NO GROWTH 2 DAYS  Final   Report Status PENDING  Incomplete    RADIOLOGY:  No results found.   Management plans discussed with the patient, family and they are in agreement.  CODE STATUS:     Code Status Orders        Start     Ordered   01/20/16 0430  Full code  Continuous     01/20/16 0429    Code Status History    Date Active Date Inactive Code Status Order ID Comments User Context   01/20/2016  3:48 AM 01/20/2016  4:29 AM Full Code [id number]  Saundra Shelling, MD ED   01/18/2016  5:18 AM 01/18/2016  7:00 AM Full Code [id number]  Saundra Shelling, MD Inpatient   12/02/2015  3:15 PM 12/03/2015  9:02 PM Full Code [id number]  Vaughan Basta, MD Inpatient   11/08/2015 10:33 PM 11/21/2015  5:53 PM Full Code [id number]  Norval Morton, MD Inpatient   08/17/2015  8:07 AM 08/18/2015  6:39 PM Full Code [id number]  Epifanio Lesches, MD ED   05/24/2015  3:42 AM 05/28/2015  5:34 PM Full Code [id number]  Saundra Shelling, MD Inpatient      TOTAL TIME TAKING CARE OF THIS PATIENT: 44mnutes.    Davinia Riccardi M.D on 01/22/2016 at 11:41 AM  Between 7am to 6pm - Pager - [phone] After 6pm go to [email or web address] - password [email or web address]  Office  [phone] CC: [facility]

## 2016-01-22 NOTE — Consult Note (Signed)
Name: Sherri Davidson MRN: [id number] DOB: 1956-01-04    ADMISSION DATE:  01/19/2016 CONSULTATION DATE:  01/20/2016  REFERRING MD :  Dr. Lavetta Nielsen  CHIEF COMPLAINT:  Hemoptysis   BRIEF PATIENT DESCRIPTION:  This is a 60 yo female who presents to [facility] ER with c/o hemoptysis she was recently discharged 8/6 with a diagnosis of pneumonia and was discharged on oral antibiotics.    SIGNIFICANT EVENTS  8/7-Pt discharged from [facility] with diagnosis of pneumonia and discharged on oral antibiotics. 8/8-Pt presents to [facility] ER with c/o hemoptysis therefore she was admitted to [facility] 8/8-PCCM consulted for management of hemoptysis  STUDIES:  CT of chest 8/8>>Small right and moderate left pleural effusions, with mild peripheral wall thickening at the right-sided pleural effusion, raising question for mild right-sided empyema. Would correlate for associated symptoms. Dense right lower lobe airspace opacification, and patchy central left-sided airspace opacity, concerning for multifocal pneumonia. Enlarged mediastinal nodes, measuring up to 2.0 cm at the subcarinal region. After completion of treatment for the patient's acute infection, follow-up CT could be considered to assess for improvement in lymphadenopathy. Diffuse coronary artery calcifications seen. Scattered calcification along the thoracic aorta and proximal great vessels, with likely severe luminal narrowing at the proximal left subclavian artery. 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 She presented to [facility] ER 8/7 she was discharged from the hospital 8/6 with pneumonia and discharged on oral antibiotics, however after she went home she had 2 episodes of scant amount of hemoptysis on 8/6 therefore prompting her to return to the ER.  Upon arrival to the ER on 8/7 CT of chest was  performed that showed multifocal pneumonia and possible right lung empyema.  She was started on broad spectrum antibiotics in the ER.  According to pts daughter in April 2017 she was told a CT of chest showed an right lung mass, however at that time the pt declined bronchoscopy for biopsy.  PCCM consulted on 8/8 due to hemoptysis.  REVIEW OF SYSTEMS:  Positives in BOLD Constitutional: Negative for fever, chills, weight loss, malaise/fatigue and diaphoresis.  HENT: Negative for hearing loss, ear pain, nosebleeds, congestion, sore throat, neck pain, tinnitus and ear discharge.   Eyes: Negative for blurred vision, double vision, photophobia, pain, discharge and redness.  Respiratory: -cough, -hemoptysis,- sputum production, +shortness of breath,  Cardiovascular: Negative for chest pain, palpitations, orthopnea, claudication, leg swelling and PND.  Gastrointestinal: Negative for heartburn, nausea, vomiting, abdominal pain, diarrhea, constipation, blood in stool and melena.  Genitourinary: Negative for dysuria, urgency, frequency, hematuria and flank pain.  Musculoskeletal: Negative for myalgias, back pain, joint pain and falls.  Skin: Negative for itching and rash.  Neurological: dysphagia, dizziness, tingling, tremors, sensory change, speech change, focal weakness, seizures, loss of consciousness, weakness and headaches.  Endo/Heme/Allergies: Negative for environmental allergies and polydipsia. Does not bruise/bleed easily.  SUBJECTIVE:  Pt currently states she is not short of breath on 3L O2 via nasal cannula.  She has had no episodes of hemoptysis since admission 8/7. Sitting up comfortably, sister at bedside, explained that patient needs bronch for dx but very high risk for complications  VITAL SIGNS: Temp:  [95.7 F (35.4 C)-98.6 F (37 C)] 98.6 F (37 C) (08/10 0630) Pulse Rate:  [90-108] 94 (08/10 0404) Resp:  [22-26] 26 (08/10 0404) BP: (104-162)/(71-91) 107/71 (  08/10 0404) SpO2:  [92  %-99 %] 96 % (08/10 0738)  PHYSICAL EXAMINATION: General:  Cachectic Neuro:  Alert confused to situation at times, follows commands HEENT: supple, no JVD Cardiovascular:  S12, rrr, no M/R/G Lungs:  Diminished throughout, mildly tachypnic, no wheezing, rales or rhonch Abdomen:  +BS x4, soft, non tender, non distended Musculoskeletal:  Normal bulk and tone Skin:  Intact, no rashes or lesions   Recent Labs Lab 01/20/16 0140 01/20/16 0506 01/21/16 0636  NA 136 137 138  K 5.3* 4.9 4.2  CL 105 107 106  CO2 19* 18* 20*  BUN 41* 41* 39*  CREATININE 6.02* 6.15* 5.88*  GLUCOSE 112* 129* 122*    Recent Labs Lab 01/20/16 0140 01/20/16 0506 01/21/16 0636  HGB 11.1* 11.1* 9.5*  HCT 33.2* 32.9* 28.2*  WBC 10.6 11.7* 11.4*  PLT 394 476* 437   No results found.  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  There is an increase in size to the right sided mass/atelectasis.   Acute Hemoptysis/mild COPD exacerbation -- likely due to multiple underlying lung issues while on plavix.  --Continue to monitor, if the patient continues to have hemoptysis, then plavix will need to be discontinued.  -recommend oral prednisone 40 mg daily for 10 days -start spiriva and dulera -continue abx 5-7 days total enough  Right lung mass/atelectasis.  --There is increasing size of the patient's mass-- this could represent mass vs. Organizing pneumonia vs. Post obstructive pneumonia from endobrochial or peri-bronchial lesion. 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  --Discussed with PoA that patient  would require bronchoscopy for this, but will be high risk given history of seizure, CVA, Systolic CHF, debility, chronic respiratory failure, and ESRD. Also given all of these problems, she may not be a candidate for chemotherapy.  --Would suggest a palliative care consult, per poa, the patient appears to have a poor grasp on her multiple conditions and I am doubtful that she can make realistic medical decisions.  --Should they opt for an intervention would need neurology consult to evaluate risk of stopping plavix for one week as well as cardiac clearance.   Right loculated effusion, chronic. 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  --Unchanged since April of 2017, given her numerous medical problems, this does not need intervention at this time.   Left Pleural effusion.  --Likely due to volume overload from ESRD and chronic systolic CHF with XJ=88%.  --Continue PD, pt may require HD.  --IR consult for thoracentesis if dyspnea progresses.   Palliative care consulted. Awaiting Neuro and Cardiac clearance if patient/POA wish to pursue procedure   The Patient requires high complexity decision making for assessment and support, frequent evaluation and titration of therapies.     Corrin Parker, M.D.  [facility]  Medical Director [facility] Director [facility] Cardio-Pulmonary Department

## 2016-01-22 NOTE — Care Management Important Message (Signed)
Important Message  Patient Details  Name: Sherri Davidson MRN: [id number] Date of Birth: [birth year]   Medicare Important Message Given:  N/A - LOS <3 / Initial given by admissions    Beverly Sessions, RN 01/22/2016, 3:10 PM

## 2016-01-22 NOTE — Progress Notes (Signed)
Subjective:  Patient was discharged 8/6 but returned for complains of coughing up small amount of blood Patient has been evaluated by pulmonary team At present, she feels back to baseline. No further hemoptysis No SOB Multiple family members and palliative care team in the room Asking about going home    Objective:  Vital signs in last 24 hours:  Temp:  [95.7 F (35.4 C)-98.6 F (37 C)] 98.6 F (37 C) (08/10 0630) Pulse Rate:  [90-108] 94 (08/10 0404) Resp:  [22-26] 26 (08/10 0404) BP: (104-162)/(71-91) 107/71 (08/10 0404) SpO2:  [92 %-99 %] 96 % (08/10 1107)  Weight change:  Filed Weights   01/19/16 2233 01/21/16 0500  Weight: 56.7 kg (125 lb) 57.7 kg (127 lb 1.6 oz)    Intake/Output:   No intake or output data in the 24 hours ending 01/22/16 1235   Physical Exam: General: No acute distress, sitting up in chair  HEENT Anicteric   Neck Supple   Pulm/lungs clear bilaterally, normal effort   CVS/Heart irregular rhythm   Abdomen:  Soft, nontender   Extremities: + dependent peripheral edema   Neurologic: Alert, oriented   Skin: Multiple excoriations   Access: PD catheter        Basic Metabolic Panel:   Recent Labs Lab 01/18/16 0718 01/19/16 0622 01/20/16 0140 01/20/16 0506 01/21/16 0636  NA 134* 135 136 137 138  K 5.5* 4.5 5.3* 4.9 4.2  CL 107 106 105 107 106  CO2 16* 19* 19* 18* 20*  GLUCOSE 117* 130* 112* 129* 122*  BUN 37* 35* 41* 41* 39*  CREATININE 6.32* 6.02* 6.02* 6.15* 5.88*  CALCIUM 8.4* 8.0* 8.2* 8.1* 8.3*  PHOS  --   --   --   --  7.8*     CBC:  Recent Labs Lab 01/18/16 0718 01/19/16 0622 01/20/16 0140 01/20/16 0506 01/21/16 0636  WBC 14.6* 9.5 10.6 11.7* 11.4*  NEUTROABS  --   --  8.3*  --   --   HGB 11.2* 10.4* 11.1* 11.1* 9.5*  HCT 33.8* 30.6* 33.2* 32.9* 28.2*  MCV 87.1 86.1 86.8 87.6 87.0  PLT 431 373 394 476* 437      Microbiology:  Recent Results (from the past 720 hour(s))  MRSA PCR Screening     Status:  Abnormal   Collection Time: 01/18/16  3:30 PM  Result Value Ref Range Status   MRSA by PCR POSITIVE (A) NEGATIVE Final    Comment:        The GeneXpert MRSA Assay (FDA approved for NASAL specimens only), is one component of a comprehensive MRSA colonization surveillance program. It is not intended to diagnose MRSA infection nor to guide or monitor treatment for MRSA infections. RESULT CALLED TO, READ BACK BY AND VERIFIED WITH: SUSAN PRESTO AT 1818 01/18/16.PMH   Blood culture (routine x 2)     Status: None (Preliminary result)   Collection Time: 01/20/16  1:39 AM  Result Value Ref Range Status   Specimen Description BLOOD LEFT HAND  Final   Special Requests BOTTLES DRAWN AEROBIC AND ANAEROBIC 5CC  Final   Culture NO GROWTH 2 DAYS  Final   Report Status PENDING  Incomplete  Blood culture (routine x 2)     Status: None (Preliminary result)   Collection Time: 01/20/16  1:39 AM  Result Value Ref Range Status   Specimen Description BLOOD LEFT ARM  Final   Special Requests BOTTLES DRAWN AEROBIC AND ANAEROBIC 5CC  Final   Culture NO GROWTH  2 DAYS  Final   Report Status PENDING  Incomplete    Coagulation Studies: No results for input(s): LABPROT, INR in the last 72 hours.  Urinalysis: No results for input(s): COLORURINE, LABSPEC, PHURINE, GLUCOSEU, HGBUR, BILIRUBINUR, KETONESUR, PROTEINUR, UROBILINOGEN, NITRITE, LEUKOCYTESUR in the last 72 hours.  Invalid input(s): APPERANCEUR    Imaging: No results found.   Medications:     . amoxicillin-clavulanate  1 tablet Oral Q12H  . dialysis solution 1.5% low-MG/low-CA   Intraperitoneal Q24H  . epoetin (EPOGEN/PROCRIT) injection  10,000 Units Subcutaneous Weekly  . famotidine  20 mg Oral Daily  . gentamicin cream  1 application Topical Daily  . ipratropium-albuterol  3 mL Nebulization Q4H  . levETIRAcetam  500 mg Oral BID  . levothyroxine  50 mcg Oral QAC breakfast  . midodrine  5 mg Oral TID WC  . mometasone-formoterol  2 puff  Inhalation BID  . nicotine  7 mg Transdermal Daily  . pantoprazole  40 mg Oral BID AC  . predniSONE  50 mg Oral Q breakfast  . senna-docusate  2 tablet Oral BID  . simvastatin  20 mg Oral QHS  . tiotropium  18 mcg Inhalation Daily   acetaminophen **OR** acetaminophen, albuterol, guaiFENesin, heparin, ondansetron, oxyCODONE-acetaminophen, senna-docusate, zinc oxide  Assessment/ Plan:  60 y.o. female female with CVA, Hypotension, diabetes mellitus type II, congestive heart failure, seizure disorder, hypothyroidism, DVT, presents 6/20/2017for Esophagitis [K20.9]  [facility] [address] Peritoneal dialysis  Prescription: CCPD with 6 exchanges 2 L fills  1. ESRD with hyperkalemia: on peritoneal dialysis.   - doing well - resume home prescription   2. Anemia of chronic kidney disease:  hemoglobin at goal - epo supplementation despite lung mass, as previously discussed with patient and family  3. Hypotension:   - midodrine supplementation   4. Hemoptysis Evaluated by pulmonary team Patient wants to pursue lung biopsy. She will get outpatient appointments arranged by [facility] to get neurology, cardiology clearance prior to the procedure.  Palliative care had a long discussion with family members. She may have may not be a candidate for aggressive treatment depending on clinical course as well as results of ongoing studies.  5.  Secondary hyperparathyroidism Monitor phosphorus during hospital admission High at present, we will address this as outpatient   LOS: 2 Wai Minotti 8/10/201712:35 PM

## 2016-01-22 NOTE — Progress Notes (Signed)
IV was removed. Discharge instructions, follow-up appointments, and prescriptions were provided to the pt. Currently waiting on EMS

## 2016-01-22 NOTE — Discharge Instructions (Signed)
Community-Acquired Pneumonia, Adult Pneumonia is an infection of the lungs. One type of pneumonia can happen while a person is in a hospital. A different type can happen when a person is not in a hospital (community-acquired pneumonia). It is easy for this kind to spread from person to person. It can spread to you if you breathe near an infected person who coughs or sneezes. Some symptoms include:  A dry cough.  A wet (productive) cough.  Fever.  Sweating.  Chest pain. HOME CARE  Take over-the-counter and prescription medicines only as told by your doctor.  Only take cough medicine if you are losing sleep.  If you were prescribed an antibiotic medicine, take it as told by your doctor. Do not stop taking the antibiotic even if you start to feel better.  Sleep with your head and neck raised (elevated). You can do this by putting a few pillows under your head, or you can sleep in a recliner.  Do not use tobacco products. These include cigarettes, chewing tobacco, and e-cigarettes. If you need help quitting, ask your doctor.  Drink enough water to keep your pee (urine) clear or pale yellow. A shot (vaccine) can help prevent pneumonia. Shots are often suggested for:  People older than 60 years of age.  People older than 60 years of age:  Who are having cancer treatment.  Who have long-term (chronic) lung disease.  Who have problems with their body's defense system (immune system). You may also prevent pneumonia if you take these actions:  Get the flu (influenza) shot every year.  Go to the dentist as often as told.  Wash your hands often. If soap and water are not available, use hand sanitizer. GET HELP IF:  You have a fever.  You lose sleep because your cough medicine does not help. GET HELP RIGHT AWAY IF:  You are short of breath and it gets worse.  You have more chest pain.  Your sickness gets worse. This is very serious if:  You are an older adult.  Your  body's defense system is weak.  You cough up blood.   This information is not intended to replace advice given to you by your health care provider. Make sure you discuss any questions you have with your health care provider.   Document Released: 11/17/2007 Document Revised: 02/19/2015 Document Reviewed: 09/25/2014 Elsevier Interactive Patient Education 2016 [employer].   Use your oxygen as directred

## 2016-01-22 NOTE — Care Management (Signed)
Patient to be discharged today.  [facility] notified.  [facility] is not available for transport today so patient will go by EMS.  Medical necessity completed and placed on chart.   Per [facility] to supply home O2.  Notified by [facility] that family declined home delivery between the house of 1-4.  Home O2 will be delivered between 3-7 tonight.  [facility] to contact bedside RN when O2 delivered  Prior to EMS transport being notified.

## 2016-01-22 NOTE — Consult Note (Signed)
Consultation Note Date: 01/22/2016   Patient Name: Sherri Davidson  DOB: 05-25-1956  MRN: [id number]  Age / Sex: 60 y.o., female  PCP: [facility] Referring Physician: Fritzi Mandes, MD  Reason for Consultation: Establishing goals of care and Psychosocial/spiritual support  HPI/Patient Profile: 60 y.o. female  admitted on 01/19/2016 with  known history of End-stage renal disease on peritoneal dialysis, CVA,, congestive heart failure, hypertension, hypothyroid, seizure disorder was discharged  from hospital after being managed for pneumonia, on oral antibiotics and after she went home she coughed up blood.   According to patient it was scanty amount and she again returned to the emergency room.   Patient was worked up with a CT chest which showed multifocal pneumonia and possible right lung empyema.   Patient with multiple co-morbid ites, non-compliance (per family), who faces many decisions regarding diagnostics, treatment options, advanced directives and care needs.    Clinical Assessment and Goals of Care:  This NP Wadie Lessen reviewed medical records, received report from team, assessed the patient and then meet at the patient's bedside along with her two sisters, Sherri Davidson, Sherri Davidson (from [place])   to discuss diagnosis, prognosis, [place], EOL wishes disposition and options.  Much of today's conversation centererd around decisions regarding further work-up for abnormal CT scan.  Family wishes to precede with OP work-up; will f/u with cardiology and  pulmonology anticipating possible bronchoscopy /biopsy.    A  discussion was had today regarding advanced directives.    The difference between a aggressive medical intervention path  and a palliative comfort care path for this patient at this time was had.  Values and goals of care important to patient and family were attempted to be  elicited.  Questions and concerns addressed.   Family encouraged to call with questions or concerns.  PMT will continue to support holistically.  [facility] is with [facility].   SUMMARY OF RECOMMENDATIONS    Code Status/Advance Care Planning:  Full code- strongly encouraged to consider DNR status knowing poor outcomes in similar patietns   Symptom Management:   Intermittent skin itching- consider Sarna cream  Palliative Prophylaxis:   Delirium Protocol and Frequent Pain Assessment   Psycho-social/Spiritual:   Desire for further Chaplaincy support:no  Prognosis:   Unable to determine  Discharge Planning: Home with [facility].  Family plans to follow up as OP with Cardiology, Pulmonology, and Neurology in order to pursue lung biopsy     Primary Diagnoses: Present on Admission: . Hemoptysis . HCAP (healthcare-associated pneumonia)   I have reviewed the medical record, interviewed the patient and family, and examined the patient. The following aspects are pertinent.  Past Medical History:  Diagnosis Date  . Blood clot in vein right hand  . Cancer ([place])    lung cancer  . CHF (congestive heart failure) ([place])   . CVA, old, hemiparesis ([place])   . Diabetes ([place])   . ESRD on peritoneal dialysis ([place])   . Hypertension   . Hypothyroidism   . Seizures ([place])   .  Stroke [facility])    Social History   Social History  . Marital status: Single    Spouse name: N/A  . Number of children: N/A  . Years of education: N/A   Occupational History  . disabled    Social History Main Topics  . Smoking status: Current Every Day Smoker    Packs/day: 0.25    Years: 30.00    Types: Cigarettes  . Smokeless tobacco: Never Used  . Alcohol use No  . Drug use: No  . Sexual activity: Not Asked   Other Topics Concern  . None   Social History Narrative  . None   Family History  Problem Relation Age of Onset  . CVA Mother   . CVA Father   . Heart failure Sister    Scheduled  Meds: . azithromycin  500 mg Oral Daily  . dialysis solution 1.5% low-MG/low-CA   Intraperitoneal Q24H  . epoetin (EPOGEN/PROCRIT) injection  10,000 Units Subcutaneous Weekly  . famotidine  20 mg Oral Daily  . gentamicin cream  1 application Topical Daily  . ipratropium-albuterol  3 mL Nebulization Q4H  . levETIRAcetam  500 mg Oral BID  . levothyroxine  50 mcg Oral QAC breakfast  . midodrine  5 mg Oral TID WC  . mometasone-formoterol  2 puff Inhalation BID  . nicotine  7 mg Transdermal Daily  . pantoprazole  40 mg Oral BID AC  . piperacillin-tazobactam (ZOSYN)  IV  3.375 g Intravenous Q12H  . predniSONE  50 mg Oral Q breakfast  . senna-docusate  2 tablet Oral BID  . simvastatin  20 mg Oral QHS  . tiotropium  18 mcg Inhalation Daily   Continuous Infusions:  PRN Meds:.acetaminophen **OR** acetaminophen, albuterol, guaiFENesin, heparin, ondansetron, oxyCODONE-acetaminophen, senna-docusate, zinc oxide Medications Prior to Admission:  Prior to Admission medications   Medication Sig Start Date End Date Taking? Authorizing Provider  albuterol (PROVENTIL HFA;VENTOLIN HFA) 108 (90 Base) MCG/ACT inhaler Inhale 2 puffs into the lungs every 6 (six) hours as needed for wheezing or shortness of breath. 01/19/16   Nicholes Mango, MD  amoxicillin-clavulanate (AUGMENTIN) 875-125 MG tablet Take 1 tablet by mouth 2 (two) times daily. 01/21/16   Lytle Butte, MD  aspirin EC 81 MG tablet Take 1 tablet (81 mg total) by mouth daily. For heart protection 11/28/15   Cherene Altes, MD  clopidogrel (PLAVIX) 75 MG tablet Take 1 tablet (75 mg total) by mouth daily. For heart protection 11/28/15   Cherene Altes, MD  guaiFENesin (ROBITUSSIN) 100 MG/5ML SOLN Take 5 mLs (100 mg total) by mouth every 6 (six) hours as needed for cough or to loosen phlegm. 01/19/16   Nicholes Mango, MD  levETIRAcetam (KEPPRA) 500 MG tablet Take 1 tablet (500 mg total) by mouth 2 (two) times daily. Patient taking differently: Take 500 mg by  mouth 2 (two) times daily. For seizure prevention 05/28/15   Vaughan Basta, MD  levothyroxine (SYNTHROID, LEVOTHROID) 50 MCG tablet Take 50 mcg by mouth daily. For hypothyroidism    Historical Provider, MD  loperamide (IMODIUM) 2 MG capsule Take 2-4 mg by mouth as needed for diarrhea or loose stools.    Historical Provider, MD  Maltodextrin-Xanthan Gum ([place]) POWD Use as per product instructions to thicken liquids to nectar thick consistency. 11/21/15   Cherene Altes, MD  midodrine (PROAMATINE) 5 MG tablet Take 1 tablet (5 mg total) by mouth 3 (three) times daily with meals. Patient taking differently: Take 5 mg by  mouth 2 (two) times daily with a meal.  11/21/15   Cherene Altes, MD  mometasone-formoterol [facility]) 100-5 MCG/ACT AERO Inhale 2 puffs into the lungs 2 (two) times daily. 01/21/16   Lytle Butte, MD  multivitamin (RENA-VIT) TABS tablet Take 1 tablet by mouth at bedtime. Patient taking differently: Take 1 tablet by mouth daily.  11/21/15   Cherene Altes, MD  mupirocin ointment (BACTROBAN) 2 % Place 1 application into the nose 2 (two) times daily. 01/19/16 01/24/16  Nicholes Mango, MD  nicotine (NICODERM CQ - DOSED IN MG/24 HR) 7 mg/24hr patch Place 1 patch (7 mg total) onto the skin daily. 01/19/16   Nicholes Mango, MD  ondansetron (ZOFRAN ODT) 4 MG disintegrating tablet Take 1 tablet (4 mg total) by mouth every 8 (eight) hours as needed for nausea or vomiting. 12/03/15   Hillary Bow, MD  oxyCODONE-acetaminophen (PERCOCET/ROXICET) 5-325 MG tablet Take 1 tablet by mouth daily as needed for moderate pain or severe pain.    Historical Provider, MD  pantoprazole (PROTONIX) 40 MG tablet Take 1 tablet (40 mg total) by mouth 2 (two) times daily before a meal. 12/03/15   Srikar Sudini, MD  potassium chloride SA (K-DUR,KLOR-CON) 20 MEQ tablet Take 20 mEq by mouth daily.     Historical Provider, MD  ranitidine (ZANTAC) 150 MG tablet Take 150 mg by mouth daily as needed for  heartburn.    Historical Provider, MD  simvastatin (ZOCOR) 20 MG tablet Take 20 mg by mouth at bedtime.     Historical Provider, MD  tiotropium (SPIRIVA) 18 MCG inhalation capsule Place 1 capsule (18 mcg total) into inhaler and inhale daily. 01/21/16   Lytle Butte, MD  Vitamin D, Ergocalciferol, (DRISDOL) 50000 units CAPS capsule Take 50,000 Units by mouth every 30 (thirty) days. On or about the 1st of each month    Historical Provider, MD  Zinc Oxide 13 % CREA Apply 1 application topically daily as needed (for barrier cream on buttocks while toileting).    Historical Provider, MD   Allergies  Allergen Reactions  . Gabapentin Anaphylaxis and Other (See Comments)    Pt states she cant breathe  . Adhesive [Tape] Hives    Please use paper tape  . Salicylates Hives and Nausea Only    Uncoated.  Diona Fanti [Aspirin] Other (See Comments)    GI upset for non-enteric coated aspirin   Review of Systems  Constitutional: Positive for fatigue.  Skin:       - constant itch on arms  Neurological: Positive for weakness.    Physical Exam  Constitutional: She appears ill. Nasal cannula in place.  HENT:  Mouth/Throat: Oropharynx is clear and moist.  Cardiovascular: Normal rate, regular rhythm and normal heart sounds.   Pulmonary/Chest: Effort normal and breath sounds normal.  Neurological: She is alert.  Skin: Skin is warm and dry. Rash noted. Rash is urticarial.    Vital Signs: BP 107/71   Pulse 94   Temp 98.6 F (37 C)   Resp (!) 26   Ht '5\' 6"'$  (1.676 m)   Wt 57.7 kg (127 lb 1.6 oz)   SpO2 96%   BMI 20.51 kg/m  Pain Assessment: 0-10   Pain Score: 2    SpO2: SpO2: 96 % O2 Device:SpO2: 96 % O2 Flow Rate: .O2 Flow Rate (L/min): 3 L/min  IO: Intake/output summary: No intake or output data in the 24 hours ending 01/22/16 0929  LBM: Last BM Date: 01/21/16 Baseline Weight: Weight:  56.7 kg (125 lb) Most recent weight: Weight: 57.7 kg (127 lb 1.6 oz)      Palliative Assessment/Data: 50  %   Flowsheet Rows   Flowsheet Row Most Recent Value  Intake Tab  Referral Department  Critical care  Unit at Time of Referral  Med/Surg Unit  Palliative Care Primary Diagnosis  Pulmonary  Date Notified  01/20/16  Palliative Care Type  Return patient Palliative Care  Reason for referral  Clarify Goals of Care, End of Life Care Assistance  Date of Admission  01/19/16  # of days IP prior to Palliative referral  1  Clinical Assessment  Psychosocial & Spiritual Assessment  Palliative Care Outcomes     Discussed with Dr Posey Pronto  Time In: 1000 Time Out: 1115 Time Total: 75 min Greater than 50%  of this time was spent counseling and coordinating care related to the above assessment and plan.  Signed by: Wadie Lessen, NP   Please contact Palliative Medicine Team phone at [phone] for questions and concerns.  For individual provider: See Shea Evans

## 2016-01-22 NOTE — Progress Notes (Signed)
Pt extremities cool to touch, pulses +1. Rectal temp 96 degrees. Placed warm blankets on pt. On reassessment, rectal temp 95.7, BUE warm, BLE cool to touch, pulses +1. Notified MD. Ordered warming blanket. Will apply to pt and continue to assess.

## 2016-01-25 LAB — CULTURE, BLOOD (ROUTINE X 2)
CULTURE: NO GROWTH
CULTURE: NO GROWTH

## 2016-03-03 ENCOUNTER — Encounter: Payer: Self-pay | Admitting: Emergency Medicine

## 2016-03-03 ENCOUNTER — Emergency Department: Payer: Medicare (Managed Care)

## 2016-03-03 ENCOUNTER — Inpatient Hospital Stay
Admission: EM | Admit: 2016-03-03 | Discharge: 2016-03-05 | DRG: 871 | Disposition: A | Discharge status: Home / Self Care | Payer: Medicare (Managed Care) | Attending: Internal Medicine | Admitting: Internal Medicine

## 2016-03-03 DIAGNOSIS — G40909 Epilepsy, unspecified, not intractable, without status epilepticus: Secondary | ICD-10-CM | POA: Diagnosis present

## 2016-03-03 DIAGNOSIS — N2581 Secondary hyperparathyroidism of renal origin: Secondary | ICD-10-CM | POA: Diagnosis present

## 2016-03-03 DIAGNOSIS — R0602 Shortness of breath: Secondary | ICD-10-CM | POA: Diagnosis not present

## 2016-03-03 DIAGNOSIS — L89152 Pressure ulcer of sacral region, stage 2: Secondary | ICD-10-CM | POA: Diagnosis present

## 2016-03-03 DIAGNOSIS — A411 Sepsis due to other specified staphylococcus: Secondary | ICD-10-CM | POA: Diagnosis present

## 2016-03-03 DIAGNOSIS — Z7951 Long term (current) use of inhaled steroids: Secondary | ICD-10-CM

## 2016-03-03 DIAGNOSIS — Z9049 Acquired absence of other specified parts of digestive tract: Secondary | ICD-10-CM

## 2016-03-03 DIAGNOSIS — I132 Hypertensive heart and chronic kidney disease with heart failure and with stage 5 chronic kidney disease, or end stage renal disease: Secondary | ICD-10-CM | POA: Diagnosis present

## 2016-03-03 DIAGNOSIS — E039 Hypothyroidism, unspecified: Secondary | ICD-10-CM | POA: Diagnosis present

## 2016-03-03 DIAGNOSIS — Z823 Family history of stroke: Secondary | ICD-10-CM

## 2016-03-03 DIAGNOSIS — L8990 Pressure ulcer of unspecified site, unspecified stage: Secondary | ICD-10-CM | POA: Insufficient documentation

## 2016-03-03 DIAGNOSIS — I509 Heart failure, unspecified: Secondary | ICD-10-CM | POA: Diagnosis present

## 2016-03-03 DIAGNOSIS — E1122 Type 2 diabetes mellitus with diabetic chronic kidney disease: Secondary | ICD-10-CM | POA: Diagnosis present

## 2016-03-03 DIAGNOSIS — Y95 Nosocomial condition: Secondary | ICD-10-CM | POA: Diagnosis present

## 2016-03-03 DIAGNOSIS — Z85118 Personal history of other malignant neoplasm of bronchus and lung: Secondary | ICD-10-CM

## 2016-03-03 DIAGNOSIS — J189 Pneumonia, unspecified organism: Secondary | ICD-10-CM | POA: Diagnosis present

## 2016-03-03 DIAGNOSIS — D631 Anemia in chronic kidney disease: Secondary | ICD-10-CM | POA: Diagnosis present

## 2016-03-03 DIAGNOSIS — Z992 Dependence on renal dialysis: Secondary | ICD-10-CM | POA: Diagnosis not present

## 2016-03-03 DIAGNOSIS — N186 End stage renal disease: Secondary | ICD-10-CM | POA: Diagnosis present

## 2016-03-03 DIAGNOSIS — I69359 Hemiplegia and hemiparesis following cerebral infarction affecting unspecified side: Secondary | ICD-10-CM | POA: Diagnosis not present

## 2016-03-03 DIAGNOSIS — Z89429 Acquired absence of other toe(s), unspecified side: Secondary | ICD-10-CM

## 2016-03-03 DIAGNOSIS — R0989 Other specified symptoms and signs involving the circulatory and respiratory systems: Secondary | ICD-10-CM | POA: Diagnosis present

## 2016-03-03 DIAGNOSIS — I959 Hypotension, unspecified: Secondary | ICD-10-CM | POA: Diagnosis present

## 2016-03-03 DIAGNOSIS — F17210 Nicotine dependence, cigarettes, uncomplicated: Secondary | ICD-10-CM | POA: Diagnosis present

## 2016-03-03 DIAGNOSIS — R7881 Bacteremia: Secondary | ICD-10-CM

## 2016-03-03 DIAGNOSIS — R1310 Dysphagia, unspecified: Secondary | ICD-10-CM | POA: Diagnosis present

## 2016-03-03 DIAGNOSIS — Z7982 Long term (current) use of aspirin: Secondary | ICD-10-CM

## 2016-03-03 DIAGNOSIS — Z8249 Family history of ischemic heart disease and other diseases of the circulatory system: Secondary | ICD-10-CM

## 2016-03-03 DIAGNOSIS — Z888 Allergy status to other drugs, medicaments and biological substances status: Secondary | ICD-10-CM

## 2016-03-03 DIAGNOSIS — R8281 Pyuria: Secondary | ICD-10-CM

## 2016-03-03 DIAGNOSIS — Z79899 Other long term (current) drug therapy: Secondary | ICD-10-CM

## 2016-03-03 LAB — BASIC METABOLIC PANEL
Anion gap: 9 (ref 5–15)
BUN: 31 mg/dL — ABNORMAL HIGH (ref 6–20)
CALCIUM: 8.6 mg/dL — AB (ref 8.9–10.3)
CO2: 26 mmol/L (ref 22–32)
CREATININE: 6.33 mg/dL — AB (ref 0.44–1.00)
Chloride: 103 mmol/L (ref 101–111)
GFR calc non Af Amer: 6 mL/min — ABNORMAL LOW (ref >60)
GFR, EST AFRICAN AMERICAN: 8 mL/min — AB (ref >60)
Glucose, Bld: 191 mg/dL — ABNORMAL HIGH (ref 65–99)
Potassium: 4.9 mmol/L (ref 3.5–5.1)
SODIUM: 138 mmol/L (ref 135–145)

## 2016-03-03 LAB — URINALYSIS COMPLETE WITH MICROSCOPIC (ARMC ONLY)
BILIRUBIN URINE: NEGATIVE
HGB URINE DIPSTICK: NEGATIVE
KETONES UR: NEGATIVE mg/dL
NITRITE: NEGATIVE
Protein, ur: 100 mg/dL — AB
SPECIFIC GRAVITY, URINE: 1.015 (ref 1.005–1.030)
pH: 8 (ref 5.0–8.0)

## 2016-03-03 LAB — CBC WITH DIFFERENTIAL/PLATELET
BASOS ABS: 0.1 10*3/uL (ref 0–0.1)
BASOS PCT: 1 %
EOS PCT: 1 %
Eosinophils Absolute: 0.2 10*3/uL (ref 0–0.7)
HEMATOCRIT: 32.8 % — AB (ref 35.0–47.0)
Hemoglobin: 10.7 g/dL — ABNORMAL LOW (ref 12.0–16.0)
Lymphocytes Relative: 13 %
Lymphs Abs: 2.6 10*3/uL (ref 1.0–3.6)
MCH: 29.7 pg (ref 26.0–34.0)
MCHC: 32.7 g/dL (ref 32.0–36.0)
MCV: 90.8 fL (ref 80.0–100.0)
MONO ABS: 1 10*3/uL — AB (ref 0.2–0.9)
Monocytes Relative: 5 %
NEUTROS ABS: 15.9 10*3/uL — AB (ref 1.4–6.5)
Neutrophils Relative %: 80 %
PLATELETS: 432 10*3/uL (ref 150–440)
RBC: 3.61 MIL/uL — ABNORMAL LOW (ref 3.80–5.20)
RDW: 17.6 % — AB (ref 11.5–14.5)
WBC: 19.9 10*3/uL — AB (ref 3.6–11.0)

## 2016-03-03 LAB — LACTIC ACID, PLASMA: LACTIC ACID, VENOUS: 1.4 mmol/L (ref 0.5–1.9)

## 2016-03-03 LAB — MRSA PCR SCREENING: MRSA by PCR: NEGATIVE

## 2016-03-03 LAB — GLUCOSE, CAPILLARY
GLUCOSE-CAPILLARY: 168 mg/dL — AB (ref 65–99)
Glucose-Capillary: 117 mg/dL — ABNORMAL HIGH (ref 65–99)

## 2016-03-03 LAB — TROPONIN I: Troponin I: 0.06 ng/mL (ref <0.03)

## 2016-03-03 MED ORDER — LEVETIRACETAM 500 MG PO TABS
500.0000 mg | ORAL_TABLET | Freq: Two times a day (BID) | ORAL | Status: DC
  Administered 2016-03-03 – 2016-03-05 (×5): 500 mg via ORAL
  Filled 2016-03-03 (×5): qty 1

## 2016-03-03 MED ORDER — MIDODRINE HCL 5 MG PO TABS
5.0000 mg | ORAL_TABLET | Freq: Three times a day (TID) | ORAL | Status: DC
  Administered 2016-03-03 – 2016-03-05 (×7): 5 mg via ORAL
  Filled 2016-03-03 (×7): qty 1

## 2016-03-03 MED ORDER — HEPARIN 1000 UNIT/ML FOR PERITONEAL DIALYSIS
3000.0000 [IU] | INTRAMUSCULAR | Status: DC | PRN
  Filled 2016-03-03: qty 3

## 2016-03-03 MED ORDER — HEPARIN SODIUM (PORCINE) 5000 UNIT/ML IJ SOLN
5000.0000 [IU] | Freq: Three times a day (TID) | INTRAMUSCULAR | Status: DC
  Administered 2016-03-03 – 2016-03-05 (×5): 5000 [IU] via SUBCUTANEOUS
  Filled 2016-03-03 (×5): qty 1

## 2016-03-03 MED ORDER — INSULIN ASPART 100 UNIT/ML ~~LOC~~ SOLN
0.0000 [IU] | Freq: Three times a day (TID) | SUBCUTANEOUS | Status: DC
  Administered 2016-03-04: 2 [IU] via SUBCUTANEOUS
  Filled 2016-03-03: qty 2

## 2016-03-03 MED ORDER — DEXTROSE 5 % IV SOLN
2.0000 g | INTRAVENOUS | Status: DC
  Filled 2016-03-03: qty 2

## 2016-03-03 MED ORDER — DEXTROSE 5 % IV SOLN
2.0000 g | Freq: Once | INTRAVENOUS | Status: AC
  Administered 2016-03-03: 2 g via INTRAVENOUS
  Filled 2016-03-03: qty 2

## 2016-03-03 MED ORDER — MOMETASONE FURO-FORMOTEROL FUM 200-5 MCG/ACT IN AERO
2.0000 | INHALATION_SPRAY | Freq: Two times a day (BID) | RESPIRATORY_TRACT | Status: DC
  Administered 2016-03-03 – 2016-03-05 (×5): 2 via RESPIRATORY_TRACT
  Filled 2016-03-03: qty 8.8

## 2016-03-03 MED ORDER — SODIUM CHLORIDE 0.9 % IV SOLN: 250.0000 mL | INTRAVENOUS | Status: DC | PRN

## 2016-03-03 MED ORDER — SIMVASTATIN 20 MG PO TABS
20.0000 mg | ORAL_TABLET | Freq: Every day | ORAL | Status: DC
  Administered 2016-03-03 – 2016-03-04 (×2): 20 mg via ORAL
  Filled 2016-03-03 (×2): qty 1

## 2016-03-03 MED ORDER — VANCOMYCIN HCL IN DEXTROSE 750-5 MG/150ML-% IV SOLN
750.0000 mg | INTRAVENOUS | Status: DC | PRN
  Filled 2016-03-03: qty 150

## 2016-03-03 MED ORDER — SODIUM CHLORIDE 0.9% FLUSH: 3.0000 mL | INTRAVENOUS | Status: DC | PRN

## 2016-03-03 MED ORDER — VANCOMYCIN HCL 500 MG IV SOLR
500.0000 mg | Freq: Once | INTRAVENOUS | Status: AC
  Administered 2016-03-03: 500 mg via INTRAVENOUS
  Filled 2016-03-03: qty 500

## 2016-03-03 MED ORDER — VANCOMYCIN HCL IN DEXTROSE 1-5 GM/200ML-% IV SOLN
1000.0000 mg | Freq: Once | INTRAVENOUS | Status: AC
  Administered 2016-03-03: 1000 mg via INTRAVENOUS
  Filled 2016-03-03: qty 200

## 2016-03-03 MED ORDER — TIOTROPIUM BROMIDE MONOHYDRATE 18 MCG IN CAPS
18.0000 ug | ORAL_CAPSULE | Freq: Every day | RESPIRATORY_TRACT | Status: DC
  Administered 2016-03-03 – 2016-03-05 (×3): 18 ug via RESPIRATORY_TRACT
  Filled 2016-03-03: qty 5

## 2016-03-03 MED ORDER — LEVOTHYROXINE SODIUM 50 MCG PO TABS
50.0000 ug | ORAL_TABLET | ORAL | Status: DC
  Administered 2016-03-04 – 2016-03-05 (×2): 50 ug via ORAL
  Filled 2016-03-03 (×2): qty 1

## 2016-03-03 MED ORDER — DELFLEX-LC/1.5% DEXTROSE 346 MOSM/L IP SOLN
INTRAPERITONEAL | Status: DC
  Administered 2016-03-03: 18:00:00 via INTRAPERITONEAL
  Filled 2016-03-03 (×3): qty 3000

## 2016-03-03 MED ORDER — IPRATROPIUM BROMIDE 0.02 % IN SOLN
0.5000 mg | Freq: Once | RESPIRATORY_TRACT | Status: AC
  Administered 2016-03-03: 0.5 mg via RESPIRATORY_TRACT
  Filled 2016-03-03: qty 2.5

## 2016-03-03 MED ORDER — FUROSEMIDE 10 MG/ML IJ SOLN
40.0000 mg | Freq: Once | INTRAMUSCULAR | Status: AC
  Administered 2016-03-03: 40 mg via INTRAVENOUS
  Filled 2016-03-03: qty 4

## 2016-03-03 MED ORDER — SODIUM CHLORIDE 0.9% FLUSH
3.0000 mL | Freq: Two times a day (BID) | INTRAVENOUS | Status: DC
  Administered 2016-03-03 – 2016-03-05 (×5): 3 mL via INTRAVENOUS

## 2016-03-03 MED ORDER — GENTAMICIN SULFATE 0.1 % EX CREA
1.0000 "application " | TOPICAL_CREAM | Freq: Every day | CUTANEOUS | Status: DC
  Administered 2016-03-03 – 2016-03-05 (×2): 1 via TOPICAL
  Filled 2016-03-03 (×2): qty 15

## 2016-03-03 MED ORDER — HYDRALAZINE HCL 20 MG/ML IJ SOLN: 10.0000 mg | INTRAMUSCULAR | Status: DC | PRN

## 2016-03-03 MED ORDER — DEXTROSE 5 % IV SOLN
1.0000 g | Freq: Three times a day (TID) | INTRAVENOUS | Status: DC
  Filled 2016-03-03 (×2): qty 1

## 2016-03-03 NOTE — Progress Notes (Signed)
Pre CCPD assessment

## 2016-03-03 NOTE — ED Notes (Addendum)
Sherri Davidson, HCPOA and pt's sister contact [phone] and states for nurse to contact her for any questions. Sherri Davidson requests having the password "[email or web address]" patient agrees to it. Sister reports pt cannot eat apple sauce and needs yogurt to take medications. Sister states pt can only eat pureed food.

## 2016-03-03 NOTE — ED Notes (Signed)
Pharmacy called for Cefepime.

## 2016-03-03 NOTE — ED Notes (Signed)
Soiled brief changed at this time. Small BM noted.

## 2016-03-03 NOTE — ED Triage Notes (Signed)
Pt arrived via ems from home with complaints of shortness of breath that began and 0200 today. Per ems report fire dept first on scene and documented a 74% room air saat and placed on non-re breather. Upon assessment pt diaphoretic and breathing is labored. Pt placed on 2 L nasal canula which is her normal at home.

## 2016-03-03 NOTE — ED Notes (Signed)
Admitting dr at bedside.  

## 2016-03-03 NOTE — ED Provider Notes (Signed)
[facility] Emergency Department Provider Note   ____________________________________________   I have reviewed the triage vital signs and the nursing notes.   HISTORY  Chief Complaint Shortness of Breath   History limited by: Not Limited   HPI Sherri Davidson is a 60 y.o. female with history of lung cancer, CHF, multiple medical problems who presents to the emergency department today because of shortness of breath. Patient has a history of lung cancer is on 2 L of oxygen. Was hospitalized last month for pneumonia. Patient had been doing okay earlier in the day however started moving closure with her breathing this evening. Family did not appreciate any recent fevers although the patient did have some sweating this evening with the shortness of breath. Patient denied any chest pain.    Past Medical History:  Diagnosis Date  . Blood clot in vein right hand  . Cancer ([place])    lung cancer  . CHF (congestive heart failure) ([place])   . CVA, old, hemiparesis ([place])   . Diabetes ([place])   . ESRD on peritoneal dialysis ([place])   . Hypertension   . Hypothyroidism   . Seizures ([place])   . Stroke [facility])     Patient Active Problem List   Diagnosis Date Noted  . Palliative care by specialist 01/22/2016  . DNR (do not resuscitate) discussion 01/22/2016  . Peritoneal dialysis status ([place])   . Hemoptysis 01/20/2016  . HCAP (healthcare-associated pneumonia) 01/20/2016  . Elevated troponin 01/18/2016  . Pneumonia 01/18/2016  . Hematemesis 12/02/2015  . Multisystem organ failure   . Controlled diabetes mellitus type 2 with complications ([place])   . End stage renal disease ([place])   . Dysphagia   . Seizures ([place]) 11/08/2015  . Vomiting 08/17/2015  . Seizure ([place]) 05/24/2015  . Community acquired pneumonia 05/24/2015  . Pressure ulcer 05/24/2015    Past Surgical History:  Procedure Laterality Date  . AMPUTATION TOE    . CHOLECYSTECTOMY    . INSERTION OF DIALYSIS  CATHETER      Prior to Admission medications   Medication Sig Start Date End Date Taking? Authorizing Provider  albuterol (PROVENTIL HFA;VENTOLIN HFA) 108 (90 Base) MCG/ACT inhaler Inhale 2 puffs into the lungs every 6 (six) hours as needed for wheezing or shortness of breath. 01/19/16   Nicholes Mango, MD  amoxicillin-clavulanate (AUGMENTIN) 875-125 MG tablet Take 1 tablet by mouth every 12 (twelve) hours. 01/22/16   Fritzi Mandes, MD  aspirin EC 81 MG tablet Take 1 tablet (81 mg total) by mouth daily. For heart protection 11/28/15   Cherene Altes, MD  clopidogrel (PLAVIX) 75 MG tablet Take 1 tablet (75 mg total) by mouth daily. For heart protection 11/28/15   Cherene Altes, MD  guaiFENesin (ROBITUSSIN) 100 MG/5ML SOLN Take 5 mLs (100 mg total) by mouth every 6 (six) hours as needed for cough or to loosen phlegm. 01/19/16   Nicholes Mango, MD  levETIRAcetam (KEPPRA) 500 MG tablet Take 1 tablet (500 mg total) by mouth 2 (two) times daily. Patient taking differently: Take 500 mg by mouth 2 (two) times daily. For seizure prevention 05/28/15   Vaughan Basta, MD  levothyroxine (SYNTHROID, LEVOTHROID) 50 MCG tablet Take 50 mcg by mouth daily. For hypothyroidism    Historical Provider, MD  loperamide (IMODIUM) 2 MG capsule Take 2-4 mg by mouth as needed for diarrhea or loose stools.    Historical Provider, MD  Maltodextrin-Xanthan Gum ([place]) POWD Use as per product instructions to  thicken liquids to nectar thick consistency. 11/21/15   Cherene Altes, MD  midodrine (PROAMATINE) 5 MG tablet Take 1 tablet (5 mg total) by mouth 3 (three) times daily with meals. Patient taking differently: Take 5 mg by mouth 2 (two) times daily with a meal.  11/21/15   Cherene Altes, MD  mometasone-formoterol [facility]) 100-5 MCG/ACT AERO Inhale 2 puffs into the lungs 2 (two) times daily. 01/21/16   Lytle Butte, MD  multivitamin (RENA-VIT) TABS tablet Take 1 tablet by mouth at bedtime. Patient taking  differently: Take 1 tablet by mouth daily.  11/21/15   Cherene Altes, MD  nicotine (NICODERM CQ - DOSED IN MG/24 HR) 7 mg/24hr patch Place 1 patch (7 mg total) onto the skin daily. 01/19/16   Nicholes Mango, MD  ondansetron (ZOFRAN ODT) 4 MG disintegrating tablet Take 1 tablet (4 mg total) by mouth every 8 (eight) hours as needed for nausea or vomiting. 12/03/15   Hillary Bow, MD  oxyCODONE-acetaminophen (PERCOCET/ROXICET) 5-325 MG tablet Take 1 tablet by mouth daily as needed for moderate pain or severe pain.    Historical Provider, MD  pantoprazole (PROTONIX) 40 MG tablet Take 1 tablet (40 mg total) by mouth 2 (two) times daily before a meal. 12/03/15   Srikar Sudini, MD  potassium chloride SA (K-DUR,KLOR-CON) 20 MEQ tablet Take 20 mEq by mouth daily.     Historical Provider, MD  predniSONE (DELTASONE) 10 MG tablet Start with 50 mg daily and taper by 10 mg daily then stop 01/22/16   Fritzi Mandes, MD  ranitidine (ZANTAC) 150 MG tablet Take 150 mg by mouth daily as needed for heartburn.    Historical Provider, MD  simvastatin (ZOCOR) 20 MG tablet Take 20 mg by mouth at bedtime.     Historical Provider, MD  tiotropium (SPIRIVA) 18 MCG inhalation capsule Place 1 capsule (18 mcg total) into inhaler and inhale daily. 01/21/16   Lytle Butte, MD  Vitamin D, Ergocalciferol, (DRISDOL) 50000 units CAPS capsule Take 50,000 Units by mouth every 30 (thirty) days. On or about the 1st of each month    Historical Provider, MD  Zinc Oxide 13 % CREA Apply 1 application topically daily as needed (for barrier cream on buttocks while toileting).    Historical Provider, MD    Allergies Gabapentin; Adhesive [tape]; Salicylates; and Asa [aspirin]  Family History  Problem Relation Age of Onset  . CVA Mother   . CVA Father   . Heart failure Sister     Social History Social History  Substance Use Topics  . Smoking status: Current Every Day Smoker    Packs/day: 0.25    Years: 30.00    Types: Cigarettes  . Smokeless  tobacco: Never Used  . Alcohol use No    Review of Systems  Constitutional: Negative for fever. Cardiovascular: Negative for chest pain. Respiratory: Positive for shortness of breath. Gastrointestinal: Negative for abdominal pain, vomiting and diarrhea. Neurological: Negative for headaches, focal weakness or numbness.  10-point ROS otherwise negative.  ____________________________________________   PHYSICAL EXAM:  VITAL SIGNS: ED Triage Vitals  Enc Vitals Group     BP 03/03/16 0439 130/80     Pulse Rate 03/03/16 0439 (!) 105     Resp 03/03/16 0439 (!) 35     Temp 03/03/16 0447 97.5 F (36.4 C)     Temp Source 03/03/16 0447 Oral     SpO2 03/03/16 0439 (!) 77 %     Weight 03/03/16 0443 135 lb (  61.2 kg)     Height 03/03/16 0443 '5\' 6"'$  (1.676 m)   Constitutional: Awake and alert. Moderate respiratory distress. Eyes: Conjunctivae are normal. Normal extraocular movements. ENT   Head: Normocephalic and atraumatic.   Nose: No congestion/rhinnorhea.   Mouth/Throat: Mucous membranes are moist.   Neck: No stridor. Hematological/Lymphatic/Immunilogical: No cervical lymphadenopathy. Cardiovascular: Normal rate, regular rhythm.  No murmurs, rubs, or gallops. Respiratory: Moderate respiratory distress. Tachypnea, increased work of breathing. Rhonchi bilateral bases.  Gastrointestinal: Soft and nontender. No distention.  Genitourinary: Deferred Musculoskeletal: Normal range of motion in all extremities. No lower extremity edema. Neurologic:  Mostly non verbal but will nod yes or no appropriately to questions.  Skin:  Skin is warm, dry and intact. No rash noted.   ____________________________________________    LABS (pertinent positives/negatives)  Labs Reviewed  CBC WITH DIFFERENTIAL/PLATELET - Abnormal; Notable for the following:       Result Value   WBC 19.9 (*)    RBC 3.61 (*)    Hemoglobin 10.7 (*)    HCT 32.8 (*)    RDW 17.6 (*)    Neutro Abs 15.9 (*)     Monocytes Absolute 1.0 (*)    All other components within normal limits  TROPONIN I - Abnormal; Notable for the following:    Troponin I 0.06 (*)    All other components within normal limits  BASIC METABOLIC PANEL - Abnormal; Notable for the following:    Glucose, Bld 191 (*)    BUN 31 (*)    Creatinine, Ser 6.33 (*)    Calcium 8.6 (*)    GFR calc non Af Amer 6 (*)    GFR calc Af Amer 8 (*)    All other components within normal limits  CULTURE, BLOOD (ROUTINE X 2)  CULTURE, BLOOD (ROUTINE X 2)  URINE CULTURE  LACTIC ACID, PLASMA  LACTIC ACID, PLASMA  URINALYSIS COMPLETEWITH MICROSCOPIC ([facility] ONLY)     ____________________________________________   EKG  I, Nance Pear, attending physician, personally viewed and interpreted this EKG  EKG Time: 0444 Rate: 103 Rhythm: sinus tachycardia Axis: normal Intervals: qtc 455 QRS: narrow, q wave V1 ST changes: no st elevation Impression: abnormal ekg   ____________________________________________    RADIOLOGY  CXR   IMPRESSION:  Cardiac enlargement with mild vascular congestion. Bilateral pleural  effusions. Bilateral pulmonary consolidations, increasing since  prior study, suggesting developing multifocal pneumonia.      ____________________________________________   PROCEDURES  Procedures  ____________________________________________   INITIAL IMPRESSION / ASSESSMENT AND PLAN / ED COURSE  Pertinent labs & imaging results that were available during my care of the patient were reviewed by me and considered in my medical decision making (see chart for details).  Patient presented to the emergency department today via EMS because of concerns for shortness of breath. Has multiple medical problems and was admitted last month for pneumonia. Chest x-ray today is concerning for pneumonia again. Patient does have a leukocytosis of 19. Given this will treat for pneumonia with broad-spectrum antibiotics. Plan on  admission the hospitalist service.   ____________________________________________   FINAL CLINICAL IMPRESSION(S) / ED DIAGNOSES  Final diagnoses:  SOB (shortness of breath)  HCAP (healthcare-associated pneumonia)     Note: This dictation was prepared with Dragon dictation. Any transcriptional errors that result from this process are unintentional    Nance Pear, MD 03/03/16 [phone]

## 2016-03-03 NOTE — H&P (Signed)
[place] [place] [facility] [place] NAME: Sherri Davidson    MR#:  [id number]  DATE OF BIRTH:  [birth year]  DATE OF ADMISSION:  03/03/2016  PRIMARY CARE PHYSICIAN: [facility]   REQUESTING/REFERRING PHYSICIAN:   CHIEF COMPLAINT:   Chief Complaint  Patient presents with  . Shortness of Breath    HISTORY OF PRESENT ILLNESS: Sherri Davidson  is a 60 y.o. female with a known history of Lung cancer, congestive heart failure, CVA, end-stage renal disease on peritoneal dialysis, hypertension, hypothyroidism, diabetes mellitus, seizure disorder limited to the emergency room with difficulty breathing. Has been short of breath for the last couple of days and also had some fever and cough. Cough is productive of yellowish phlegm. Patient was recently managed for pneumonia hemoptysis secondary to lung cancer during her last hospitalization. No complaints of any chest pain. Has borderline troponin today during the evaluation in the emergency room. Chest x-ray during the workup also showed bilateral pleural effusions and pneumonia multifocal. Patient received broad-spectrum IV diuretics the emergency room. Had low-grade fever and chills. No sick contacts at home or any recent travel after discharge. Hospitalist service was consulted for further care of the patient.  PAST MEDICAL HISTORY:   Past Medical History:  Diagnosis Date  . Blood clot in vein right hand  . Cancer ([place])    lung cancer  . CHF (congestive heart failure) ([place])   . CVA, old, hemiparesis ([place])   . Diabetes ([place])   . ESRD on peritoneal dialysis ([place])   . Hypertension   . Hypothyroidism   . Seizures ([place])   . Stroke [facility])     PAST SURGICAL HISTORY: Past Surgical History:  Procedure Laterality Date  . AMPUTATION TOE    . CHOLECYSTECTOMY    . INSERTION OF DIALYSIS CATHETER      SOCIAL HISTORY:  Social History  Substance Use Topics  . Smoking status: Current Every Day  Smoker    Packs/day: 0.25    Years: 30.00    Types: Cigarettes  . Smokeless tobacco: Never Used  . Alcohol use No    FAMILY HISTORY:  Family History  Problem Relation Age of Onset  . CVA Mother   . CVA Father   . Heart failure Sister     DRUG ALLERGIES:  Allergies  Allergen Reactions  . Albuterol Anaphylaxis  . Gabapentin Anaphylaxis and Other (See Comments)    Pt states she cant breathe  . Adhesive [Tape] Hives    Please use paper tape  . Salicylates Hives and Nausea Only    Uncoated.  Diona Fanti [Aspirin] Other (See Comments)    GI upset for non-enteric coated aspirin    REVIEW OF SYSTEMS:   CONSTITUTIONAL: Has fever, weakness.  EYES: No blurred or double vision.  EARS, NOSE, AND THROAT: No tinnitus or ear pain.  RESPIRATORY: Has cough, shortness of breath, no wheezing or hemoptysis.  CARDIOVASCULAR: No chest pain, orthopnea, edema.  GASTROINTESTINAL: No nausea, vomiting, diarrhea or abdominal pain.  GENITOURINARY: No dysuria, hematuria.  ENDOCRINE: No polyuria, nocturia,  HEMATOLOGY: No anemia, easy bruising or bleeding SKIN: No rash or lesion. MUSCULOSKELETAL: No joint pain or arthritis.   NEUROLOGIC: No tingling, numbness, weakness.  PSYCHIATRY: No anxiety or depression.   MEDICATIONS AT HOME:  Prior to Admission medications   Medication Sig Start Date End Date Taking? Authorizing Provider  aspirin EC 81 MG tablet Take 1 tablet (81 mg total) by mouth daily. For heart  protection 11/28/15  Yes Cherene Altes, MD  Fluticasone-Salmeterol (ADVAIR) 250-50 MCG/DOSE AEPB Inhale 1 puff into the lungs 2 (two) times daily.   Yes Historical Provider, MD  levETIRAcetam (KEPPRA) 500 MG tablet Take 1 tablet (500 mg total) by mouth 2 (two) times daily. 05/28/15  Yes Vaughan Basta, MD  levothyroxine (SYNTHROID, LEVOTHROID) 50 MCG tablet Take 50 mcg by mouth daily. For hypothyroidism   Yes Historical Provider, MD  loperamide (IMODIUM) 2 MG capsule Take 2-4 mg by mouth as  needed for diarrhea or loose stools.   Yes Historical Provider, MD  midodrine (PROAMATINE) 5 MG tablet Take 1 tablet (5 mg total) by mouth 3 (three) times daily with meals. Patient taking differently: Take 5 mg by mouth 2 (two) times daily with a meal.  11/21/15  Yes Cherene Altes, MD  multivitamin (RENA-VIT) TABS tablet Take 1 tablet by mouth at bedtime. Patient taking differently: Take 1 tablet by mouth daily.  11/21/15  Yes Cherene Altes, MD  oxyCODONE-acetaminophen (PERCOCET/ROXICET) 5-325 MG tablet Take 2 tablets by mouth daily as needed for moderate pain or severe pain.    Yes Historical Provider, MD  potassium chloride (K-DUR,KLOR-CON) 10 MEQ tablet Take 10 mEq by mouth daily.    Yes Historical Provider, MD  simvastatin (ZOCOR) 20 MG tablet Take 20 mg by mouth at bedtime.    Yes Historical Provider, MD  tiotropium (SPIRIVA) 18 MCG inhalation capsule Place 1 capsule (18 mcg total) into inhaler and inhale daily. 01/21/16  Yes Lytle Butte, MD  clopidogrel (PLAVIX) 75 MG tablet Take 1 tablet (75 mg total) by mouth daily. For heart protection Patient not taking: Reported on 03/03/2016 11/28/15   Cherene Altes, MD      PHYSICAL EXAMINATION:   VITAL SIGNS: Blood pressure (!) 165/100, pulse 94, temperature 97.5 F (36.4 C), temperature source Oral, resp. rate (!) 24, height '5\' 6"'$  (1.676 m), weight 61.2 kg (135 lb), SpO2 99 %.  GENERAL:  60 y.o.-year-old patient lying in the bed with no acute distress.  EYES: Pupils equal, round, reactive to light and accommodation. No scleral icterus. Extraocular muscles intact.  HEENT: Head atraumatic, normocephalic. Oropharynx and nasopharynx clear.  NECK:  Supple, no jugular venous distention. No thyroid enlargement, no tenderness.  LUNGS: Decreased breath sounds bilaterally, bibasilar crepitations heard. No use of accessory muscles of respiration.  CARDIOVASCULAR: S1, S2 normal. No murmurs, rubs, or gallops.  ABDOMEN: Soft, nontender, nondistended.  Bowel sounds present. No organomegaly or mass.  Peritoneal catheter access site noted EXTREMITIES: No pedal edema, cyanosis, or clubbing.  NEUROLOGIC: Cranial nerves II through XII are intact. Muscle strength 5/5 in all extremities. Sensation intact. Gait not checked.  PSYCHIATRIC: The patient is alert and oriented x 3.  SKIN: No obvious rash, lesion, or ulcer.   LABORATORY PANEL:   CBC  Recent Labs Lab 03/03/16 0442  WBC 19.9*  HGB 10.7*  HCT 32.8*  PLT 432  MCV 90.8  MCH 29.7  MCHC 32.7  RDW 17.6*  LYMPHSABS 2.6  MONOABS 1.0*  EOSABS 0.2  BASOSABS 0.1   ------------------------------------------------------------------------------------------------------------------  Chemistries   Recent Labs Lab 03/03/16 0442  NA 138  K 4.9  CL 103  CO2 26  GLUCOSE 191*  BUN 31*  CREATININE 6.33*  CALCIUM 8.6*   ------------------------------------------------------------------------------------------------------------------ estimated creatinine clearance is 9 mL/min (by C-G formula based on SCr of 6.33 mg/dL (H)). ------------------------------------------------------------------------------------------------------------------ No results for input(s): TSH, T4TOTAL, T3FREE, THYROIDAB in the last 72 hours.  Invalid input(s):  FREET3   Coagulation profile No results for input(s): INR, PROTIME in the last 168 hours. ------------------------------------------------------------------------------------------------------------------- No results for input(s): DDIMER in the last 72 hours. -------------------------------------------------------------------------------------------------------------------  Cardiac Enzymes  Recent Labs Lab 03/03/16 0442  TROPONINI 0.06*   ------------------------------------------------------------------------------------------------------------------ Invalid input(s):  POCBNP  ---------------------------------------------------------------------------------------------------------------  Urinalysis    Component Value Date/Time   COLORURINE YELLOW 11/08/2015 1707   APPEARANCEUR TURBID (A) 11/08/2015 1707   APPEARANCEUR Clear 05/30/2014 1618   LABSPEC 1.018 11/08/2015 1707   LABSPEC 1.009 05/30/2014 1618   PHURINE 7.5 11/08/2015 1707   GLUCOSEU 100 (A) 11/08/2015 1707   GLUCOSEU >=500 05/30/2014 1618   HGBUR LARGE (A) 11/08/2015 1707   BILIRUBINUR NEGATIVE 11/08/2015 1707   BILIRUBINUR Negative 05/30/2014 1618   KETONESUR NEGATIVE 11/08/2015 1707   PROTEINUR >300 (A) 11/08/2015 1707   NITRITE NEGATIVE 11/08/2015 1707   LEUKOCYTESUR LARGE (A) 11/08/2015 1707   LEUKOCYTESUR Negative 05/30/2014 1618     RADIOLOGY: Dg Chest Portable 1 View  Result Date: 03/03/2016 CLINICAL DATA:  Shortness of breath beginning at 0200 hours today. Decreased oxygen saturation. Diaphoresis with labored breathing. EXAM: PORTABLE CHEST 1 VIEW COMPARISON:  CT chest 01/19/2016.  Chest 01/19/2016. FINDINGS: Cardiac enlargement with central pulmonary vascular congestion. Bilateral pleural effusions. Consolidation demonstrated in both mid and lower lung zones, increasing since previous study. This likely reflects multifocal pneumonia. No pneumothorax. Calcification of the aorta. IMPRESSION: Cardiac enlargement with mild vascular congestion. Bilateral pleural effusions. Bilateral pulmonary consolidations, increasing since prior study, suggesting developing multifocal pneumonia. Electronically Signed   By: Lucienne Capers M.D.   On: 03/03/2016 05:18    EKG: Orders placed or performed during the hospital encounter of 01/18/16  . EKG 12-Lead  . EKG 12-Lead    IMPRESSION AND PLAN: 60 year old female patient with history of end-stage renal disease on peritoneal dialysis, lung cancer, seizure disorder,, CVA, hypertension, hypothyroidism presented to the emergency room with cough,  fever and shortness of breath. Admitting diagnosis 1. Healthcare associated pneumonia 2. Bilateral multifocal pneumonia 3. Pleural effusion secondary to pneumonia 4. End-stage release on peritoneal dialysis 5. Seizure disorder 6. Hypertension Treatment plan Admit patient to medical floor Start patient on IV vancomycin and IV cefepime antibiotics DVT prophylaxis subcutaneous heparin Cycle troponin to rule out ischemia Nebulization treatments Resume Keppra for seizure disorder Supportive care.  All the records are reviewed and case discussed with ED provider. Management plans discussed with the patient, family and they are in agreement.  CODE STATUS:FULL Code Status History    Date Active Date Inactive Code Status Order ID Comments User Context   01/20/2016  4:29 AM 01/22/2016  8:56 PM Full Code [id number]  Saundra Shelling, MD Inpatient   01/20/2016  3:48 AM 01/20/2016  4:29 AM Full Code [id number]  Saundra Shelling, MD ED   01/18/2016  5:18 AM 01/18/2016  7:00 AM Full Code [id number]  Saundra Shelling, MD Inpatient   12/02/2015  3:15 PM 12/03/2015  9:02 PM Full Code [id number]  Vaughan Basta, MD Inpatient   11/08/2015 10:33 PM 11/21/2015  5:53 PM Full Code [id number]  Norval Morton, MD Inpatient   08/17/2015  8:07 AM 08/18/2015  6:39 PM Full Code [id number]  Epifanio Lesches, MD ED   05/24/2015  3:42 AM 05/28/2015  5:34 PM Full Code [id number]  Saundra Shelling, MD Inpatient       TOTAL TIME TAKING CARE OF THIS PATIENT: 53 minutes.    Saundra Shelling M.D on 03/03/2016 at 7:06 AM  Between 7am to 6pm - Pager - [phone]  After 6pm go to [email or web address] - password [email or web address] [place] [facility]  Office  [phone]  CC: Primary care physician; [facility]

## 2016-03-03 NOTE — Plan of Care (Signed)
Problem: SLP Dysphagia Goals Goal: Misc Dysphagia Goal Pt will safely tolerate po diet of least restrictive consistency w/ no overt s/s of aspiration noted by Staff/pt/family x3 sessions.    

## 2016-03-03 NOTE — ED Notes (Signed)
Attempted to call report x2

## 2016-03-03 NOTE — Evaluation (Signed)
Clinical/Bedside Swallow Evaluation Patient Details  Name: Sherri Davidson MRN: [id number] Date of Birth: 12-04-1955  Today's Date: 03/03/2016 Time: SLP Start Time (ACUTE ONLY): 1400 SLP Stop Time (ACUTE ONLY): 1500 SLP Time Calculation (min) (ACUTE ONLY): 60 min  Past Medical History:  Past Medical History:  Diagnosis Date  . Blood clot in vein right hand  . Cancer ([place])    lung cancer  . CHF (congestive heart failure) ([place])   . CVA, old, hemiparesis ([place])   . Diabetes ([place])   . ESRD on peritoneal dialysis ([place])   . Hypertension   . Hypothyroidism   . Seizures ([place])   . Stroke [facility])    Past Surgical History:  Past Surgical History:  Procedure Laterality Date  . AMPUTATION TOE    . CHOLECYSTECTOMY    . INSERTION OF DIALYSIS CATHETER     HPI:  Pt is a 60 y.o. female with a known history of Lung cancer, pneumonia, congestive heart failure, CVA w/ hemiparesis, Dysphagia, end-stage renal disease on peritoneal dialysis, hypertension, hypothyroidism, diabetes mellitus, seizure disorder limited to the emergency room with difficulty breathing. Has been short of breath for the last couple of days and also had some fever and cough. Cough is productive of yellowish phlegm. Patient was recently managed for pneumonia hemoptysis secondary to lung cancer during her last hospitalization. No complaints of any chest pain. Has borderline troponin today during the evaluation in the emergency room. Chest x-ray during the workup also showed bilateral pleural effusions and pneumonia multifocal. Pt was seen by ST services at [facility] during a recent admission d/t pneumonia and dysphagia. She was recommended to remain on a Dysphagia 1(puree) diet w/ Nectar consistency liquids.    Assessment / Plan / Recommendation Clinical Impression  Pt presents w/ a baseline status of oropharyngeal phase dysphagia w/ recommendation to remain on a Dysphagia 1(puree) diet w/ Nectar consistency liquids post assessment  during a fairly recent admission at [facility]. Pt was assessed w/ these consistencies d/t her presentation/medical status as well as baseline dysphagia dx'd by ST services. Pt appeared to adequately tolerate trials of these consistencies w/ no immediate, overt s/s of aspiration noted; no decline in vocal quality or respiratory status during/post intake. Pt appeared to exhibit adequate oral phase management for bolus transfer and clearing of these consistencies. Pt required verbal cues to redirect to task; assistance w/ feeding d/t her declined mental/Cognitive status at eval. Recommend initiation of a Dysphagia 1(puree) diet w/ Nectar liquids; strict aspiration precautions; assistance w/ meals. Pt does appear at increased risk for aspiration and the recommended diet consistency w/ precautions may help to reduce risk for aspiration. NSG updated.     Aspiration Risk  Mild aspiration risk (-Moderate risk)    Diet Recommendation  Dysphagia 1(puree) w/ Nectar consistency liquids; aspiration precautions; assistance at meals.   Medication Administration: Crushed with puree (as able)    Other  Recommendations Recommended Consults:  (Dietician consult) Oral Care Recommendations: Oral care BID;Staff/trained caregiver to provide oral care Other Recommendations: Order thickener from pharmacy;Prohibited food (jello, ice cream, thin soups);Remove water pitcher;Have oral suction available   Follow up Recommendations Home health SLP (TBD)      Frequency and Duration min 2x/week  1 week       Prognosis Prognosis for Safe Diet Advancement: Fair Barriers to Reach Goals: Cognitive deficits;Behavior;Motivation;Time post onset      Swallow Study   General Date of Onset: 03/03/16 HPI: Pt is a 61 y.o. female with a  known history of Lung cancer, pneumonia, congestive heart failure, CVA w/ hemiparesis, Dysphagia, end-stage renal disease on peritoneal dialysis, hypertension, hypothyroidism, diabetes mellitus,  seizure disorder limited to the emergency room with difficulty breathing. Has been short of breath for the last couple of days and also had some fever and cough. Cough is productive of yellowish phlegm. Patient was recently managed for pneumonia hemoptysis secondary to lung cancer during her last hospitalization. No complaints of any chest pain. Has borderline troponin today during the evaluation in the emergency room. Chest x-ray during the workup also showed bilateral pleural effusions and pneumonia multifocal. Pt was seen by ST services at [facility] during a recent admission d/t pneumonia and dysphagia. She was recommended to remain on a Dysphagia 1(puree) diet w/ Nectar consistency liquids.  Type of Study: Bedside Swallow Evaluation Previous Swallow Assessment: Assessed during hospital admission at [facility] and recommend d/c home with puree diet and nectar thick liquids to reduce risk of aspiration and facilitate intake. Sister is in agreement and SLP discussed instructions for thickening. Pt would benefit from home health SLP for education.  Diet Prior to this Study: Dysphagia 1 (puree);Nectar-thick liquids Temperature Spikes Noted: No (wbc 19.9) Respiratory Status: Nasal cannula (2 liters) History of Recent Intubation: No Behavior/Cognition: Alert;Cooperative;Confused;Agitated;Impulsive;Distractible;Requires cueing Oral Cavity Assessment: Within Functional Limits;Dry Oral Care Completed by SLP: Recent completion by staff Oral Cavity - Dentition: Edentulous Vision: Functional for self-feeding Self-Feeding Abilities: Able to feed self;Needs assist;Needs set up Patient Positioning: Upright in bed Baseline Vocal Quality: Normal Volitional Cough: Strong Volitional Swallow: Able to elicit    Oral/Motor/Sensory Function Overall Oral Motor/Sensory Function: Within functional limits (grossly)   Ice Chips Ice chips: Not tested Other Comments: baseline Nectar consistency liquids   Thin Liquid Thin  Liquid: Not tested Other Comments: baseline Nectar consistency liquids    Nectar Thick Nectar Thick Liquid: Within functional limits Presentation: Straw;Self Fed (assisted; 5 trials accepted) Other Comments: appeared to indicate she was not thickening liquids at home every time per her description   Honey Thick Honey Thick Liquid: Not tested   Puree Puree: Within functional limits Presentation: Spoon (fed; 4 trials accepted)   Solid   GO   Solid: Not tested Other Comments: baseline Puree diet at home         Orinda Kenner, MS, CCC-SLP  Davidson,Sherri 03/03/2016,3:01 PM

## 2016-03-03 NOTE — Progress Notes (Addendum)
Pharmacy Antibiotic Note  Sherri Davidson is a 60 y.o. female admitted on 03/03/2016 with pneumonia (HCAP).  Pharmacy has been consulted for vancomycin and cefepime dosing. Per H&P pt with ESRD on peritoneal dialysis.   Pt received vancomycin 1 g x1 at 0602 and cefepime 2 g IV x1 at 0638 in the ED.   Plan: Cefepime 2g IV q48h for PD dosing.   Vancomycin 500 mg IV x1 additional dose for total loading dose of 1500 mg.  Random vanc level 9/22 AM (Day 3 of therapy). Will plan to redose when vancomycin level <20 mcg/ml. Will enter a placeholder vancomycin order for 750 mg IV prn indicating an active vancomycin order but may need to adjust dose based on levels and PD plans.     Height: '5\' 6"'$  (167.6 cm) Weight: 135 lb (61.2 kg) IBW/kg (Calculated) : 59.3  Temp (24hrs), Avg:97.6 F (36.4 C), Min:97.5 F (36.4 C), Max:97.6 F (36.4 C)   Recent Labs Lab 03/03/16 0442 03/03/16 0550  WBC 19.9*  --   CREATININE 6.33*  --   LATICACIDVEN  --  1.4    Estimated Creatinine Clearance: 9 mL/min (by C-G formula based on SCr of 6.33 mg/dL (H)).    Allergies  Allergen Reactions  . Albuterol Anaphylaxis  . Gabapentin Anaphylaxis and Other (See Comments)    Pt states she cant breathe  . Adhesive [Tape] Hives    Please use paper tape  . Salicylates Hives and Nausea Only    Uncoated.  Diona Fanti [Aspirin] Other (See Comments)    GI upset for non-enteric coated aspirin    Antimicrobials this admission: 9/20 vanc >>  9/20 cefepime  >>   Dose adjustments this admission:   Microbiology results: 9/20 BCx: sent 9/20 UCx: sent  9/20 MRSA PCR: sent  9/21 AM Lab called [facility] Staphylococcus spp. mecA(+.) Patient is already on vancomycin. No new recommendations.  Thank you for allowing pharmacy to be a part of this patient's care.  Rocky Morel 03/03/2016 11:09 AM

## 2016-03-03 NOTE — Progress Notes (Addendum)
admitted this am for sob,cough .HCAP.pt chart revived Pt is very anxious,and wants to sit in chair.nursenoticed some difficulty swallowing  VITALS ARE STABLE.  Physical;lunga are clear but diminished breath sounds overall  Alert,awake,orieted  Plan to continue abx for HCAP  SeizureDisorder   Esrd;on PD  Total time 25 mmimn,

## 2016-03-03 NOTE — ED Notes (Signed)
Attempted to call report x 1  

## 2016-03-03 NOTE — Progress Notes (Signed)
Subjective:   Patient is known to our practice from outpatient. Patient admitted for shortness of breath Chest x-ray shows cardiac enlargement with mild vascular congestion.  Bilateral pleural effusions and bilateral pulmonary consolidations.  Objective:  Vital signs in last 24 hours:  Temp:  [97.5 F (36.4 C)-97.6 F (36.4 C)] 97.6 F (36.4 C) (09/20 1036) Pulse Rate:  [66-105] 66 (09/20 1036) Resp:  [22-35] 22 (09/20 1036) BP: (110-165)/(72-107) 134/95 (09/20 1036) SpO2:  [77 %-100 %] 100 % (09/20 1040) Weight:  [61.2 kg (135 lb)] 61.2 kg (135 lb) (09/20 0443)  Weight change:  Filed Weights   03/03/16 0443  Weight: 61.2 kg (135 lb)    Intake/Output:    Intake/Output Summary (Last 24 hours) at 03/03/16 1242 Last data filed at 03/03/16 0708  Gross per 24 hour  Intake              250 ml  Output                0 ml  Net              250 ml     Physical Exam: General: No acute distress, laying in the bed  HEENT anicteric  Neck supple  Pulm/lungs Tachypneic, shallow breathing  CVS/Heart irregular  Abdomen:  Soft, nontender, nondistended  Extremities: No peripheral edema  Neurologic: Alert, oriented  Skin: Warm, dry  Access: pD catheter       Basic Metabolic Panel:   Recent Labs Lab 03/03/16 0442  NA 138  K 4.9  CL 103  CO2 26  GLUCOSE 191*  BUN 31*  CREATININE 6.33*  CALCIUM 8.6*     CBC:  Recent Labs Lab 03/03/16 0442  WBC 19.9*  NEUTROABS 15.9*  HGB 10.7*  HCT 32.8*  MCV 90.8  PLT 432      Microbiology:  Recent Results (from the past 720 hour(s))  Blood culture (routine x 2)     Status: None (Preliminary result)   Collection Time: 03/03/16  5:50 AM  Result Value Ref Range Status   Specimen Description BLOOD LEFT WRIST  Final   Special Requests BOTTLES DRAWN AEROBIC AND ANAEROBIC 2ML  Final   Culture NO GROWTH < 12 HOURS  Final   Report Status PENDING  Incomplete  Blood culture (routine x 2)     Status: None (Preliminary  result)   Collection Time: 03/03/16  5:50 AM  Result Value Ref Range Status   Specimen Description BLOOD RIGHT ARM  Final   Special Requests   Final    BOTTLES DRAWN AEROBIC AND ANAEROBIC AER 2ML ANA 4ML   Culture NO GROWTH < 12 HOURS  Final   Report Status PENDING  Incomplete    Coagulation Studies: No results for input(s): LABPROT, INR in the last 72 hours.  Urinalysis:  Recent Labs  03/03/16 0603  COLORURINE YELLOW*  LABSPEC 1.015  PHURINE 8.0  GLUCOSEU >500*  HGBUR NEGATIVE  BILIRUBINUR NEGATIVE  KETONESUR NEGATIVE  PROTEINUR 100*  NITRITE NEGATIVE  LEUKOCYTESUR 3+*      Imaging: Dg Chest Portable 1 View  Result Date: 03/03/2016 CLINICAL DATA:  Shortness of breath beginning at 0200 hours today. Decreased oxygen saturation. Diaphoresis with labored breathing. EXAM: PORTABLE CHEST 1 VIEW COMPARISON:  CT chest 01/19/2016.  Chest 01/19/2016. FINDINGS: Cardiac enlargement with central pulmonary vascular congestion. Bilateral pleural effusions. Consolidation demonstrated in both mid and lower lung zones, increasing since previous study. This likely reflects multifocal pneumonia. No pneumothorax. Calcification of  the aorta. IMPRESSION: Cardiac enlargement with mild vascular congestion. Bilateral pleural effusions. Bilateral pulmonary consolidations, increasing since prior study, suggesting developing multifocal pneumonia. Electronically Signed   By: Lucienne Capers M.D.   On: 03/03/2016 05:18     Medications:     . [START ON 03/05/2016] ceFEPime (MAXIPIME) IV  2 g Intravenous Q48H  . heparin  5,000 Units Subcutaneous Q8H  . levETIRAcetam  500 mg Oral BID  . levothyroxine  50 mcg Oral BH-q7a  . midodrine  5 mg Oral TID WC  . mometasone-formoterol  2 puff Inhalation BID  . simvastatin  20 mg Oral QHS  . sodium chloride flush  3 mL Intravenous Q12H  . tiotropium  18 mcg Inhalation Daily  . vancomycin  500 mg Intravenous Once   sodium chloride, hydrALAZINE, sodium  chloride flush, [START ON 03/05/2016] vancomycin  Assessment/ Plan:  60 y.o. female with CVA, Hypotension, diabetes mellitus type II, congestive heart failure, seizure disorder, hypothyroidism, DVT,  [facility] [address] Peritoneal dialysis  Prescription: CCPD with 6 exchanges 2 L fills  1. ESRD : on peritoneal dialysis.   - resume home prescription   2. Anemia of chronic kidney disease:  hemoglobin at goal - epo supplementation despite lung mass, as previously discussed with patient and family  3. Hypotension:   - midodrine supplementation   4.  Secondary hyperparathyroidism Monitor phosphorus during hospital admission    LOS: 0 Sherri Davidson 9/20/201712:42 PM

## 2016-03-04 ENCOUNTER — Inpatient Hospital Stay: Admit: 2016-03-04 | Payer: Medicare (Managed Care)

## 2016-03-04 ENCOUNTER — Inpatient Hospital Stay: Payer: Medicare (Managed Care)

## 2016-03-04 LAB — BLOOD CULTURE ID PANEL (REFLEXED)
Acinetobacter baumannii: NOT DETECTED
CANDIDA ALBICANS: NOT DETECTED
CANDIDA GLABRATA: NOT DETECTED
CANDIDA KRUSEI: NOT DETECTED
CANDIDA PARAPSILOSIS: NOT DETECTED
CANDIDA TROPICALIS: NOT DETECTED
Carbapenem resistance: NOT DETECTED
ENTEROBACTER CLOACAE COMPLEX: NOT DETECTED
ENTEROCOCCUS SPECIES: NOT DETECTED
Enterobacteriaceae species: NOT DETECTED
Escherichia coli: NOT DETECTED
Haemophilus influenzae: NOT DETECTED
KLEBSIELLA OXYTOCA: NOT DETECTED
KLEBSIELLA PNEUMONIAE: NOT DETECTED
Listeria monocytogenes: NOT DETECTED
Methicillin resistance: DETECTED — AB
Neisseria meningitidis: NOT DETECTED
PROTEUS SPECIES: NOT DETECTED
Pseudomonas aeruginosa: NOT DETECTED
STAPHYLOCOCCUS AUREUS BCID: NOT DETECTED
STREPTOCOCCUS PNEUMONIAE: NOT DETECTED
Serratia marcescens: NOT DETECTED
Staphylococcus species: DETECTED — AB
Streptococcus agalactiae: NOT DETECTED
Streptococcus pyogenes: NOT DETECTED
Streptococcus species: NOT DETECTED
VANCOMYCIN RESISTANCE: NOT DETECTED

## 2016-03-04 LAB — CBC
HCT: 28.9 % — ABNORMAL LOW (ref 35.0–47.0)
HEMOGLOBIN: 9.7 g/dL — AB (ref 12.0–16.0)
MCH: 30.1 pg (ref 26.0–34.0)
MCHC: 33.5 g/dL (ref 32.0–36.0)
MCV: 89.9 fL (ref 80.0–100.0)
Platelets: 354 10*3/uL (ref 150–440)
RBC: 3.21 MIL/uL — ABNORMAL LOW (ref 3.80–5.20)
RDW: 17.8 % — ABNORMAL HIGH (ref 11.5–14.5)
WBC: 10.5 10*3/uL (ref 3.6–11.0)

## 2016-03-04 LAB — BASIC METABOLIC PANEL
Anion gap: 10 (ref 5–15)
BUN: 33 mg/dL — ABNORMAL HIGH (ref 6–20)
CALCIUM: 8.4 mg/dL — AB (ref 8.9–10.3)
CHLORIDE: 104 mmol/L (ref 101–111)
CO2: 25 mmol/L (ref 22–32)
CREATININE: 6.12 mg/dL — AB (ref 0.44–1.00)
GFR calc non Af Amer: 7 mL/min — ABNORMAL LOW (ref >60)
GFR, EST AFRICAN AMERICAN: 8 mL/min — AB (ref >60)
GLUCOSE: 111 mg/dL — AB (ref 65–99)
POTASSIUM: 4.5 mmol/L (ref 3.5–5.1)
SODIUM: 139 mmol/L (ref 135–145)

## 2016-03-04 LAB — GLUCOSE, CAPILLARY
GLUCOSE-CAPILLARY: 102 mg/dL — AB (ref 65–99)
GLUCOSE-CAPILLARY: 161 mg/dL — AB (ref 65–99)
GLUCOSE-CAPILLARY: 117 mg/dL — AB (ref 65–99)
Glucose-Capillary: 128 mg/dL — ABNORMAL HIGH (ref 65–99)

## 2016-03-04 LAB — URINE CULTURE: CULTURE: NO GROWTH

## 2016-03-04 MED ORDER — FUROSEMIDE 10 MG/ML IJ SOLN
40.0000 mg | Freq: Once | INTRAMUSCULAR | Status: AC
  Administered 2016-03-04: 40 mg via INTRAVENOUS
  Filled 2016-03-04: qty 4

## 2016-03-04 NOTE — Progress Notes (Signed)
Primary nurse spoke to Dr. Jannifer Franklin in regard to pt VS; wheezing; refusal of Dialysis, MD informed primary nurse that he placed orders for stat chest portable xray. Primary nurse to continue to monitor.

## 2016-03-04 NOTE — Progress Notes (Signed)
Primary nurse notified Dr. Jannifer Franklin of Chest xray results. Orders received for 40 mg IV lasix once. Primary nurse to continue to monitor.

## 2016-03-04 NOTE — Progress Notes (Signed)
Subjective:   Patient is known to our practice from outpatient. Patient admitted for shortness of breath Chest x-ray shows cardiac enlargement with mild vascular congestion.  Bilateral pleural effusions and bilateral pulmonary consolidations. Overall feels better. Wants to go home today Blood culture positive for MRSA  Objective:  Vital signs in last 24 hours:  Temp:  [97 F (36.1 C)-98.3 F (36.8 C)] 98.3 F (36.8 C) (09/21 0504) Pulse Rate:  [86-99] 99 (09/21 0504) Resp:  [18-19] 19 (09/21 0504) BP: (118-151)/(64-85) 151/85 (09/21 0504) SpO2:  [96 %-98 %] 96 % (09/21 0504) Weight:  [60.7 kg (133 lb 14.4 oz)] 60.7 kg (133 lb 14.4 oz) (09/21 0526)  Weight change: -0.499 kg (-1 lb 1.6 oz) Filed Weights   03/03/16 0443 03/04/16 0526  Weight: 61.2 kg (135 lb) 60.7 kg (133 lb 14.4 oz)    Intake/Output:    Intake/Output Summary (Last 24 hours) at 03/04/16 1108 Last data filed at 03/04/16 1000  Gross per 24 hour  Intake              360 ml  Output              424 ml  Net              -64 ml     Physical Exam: General: No acute distress, laying in the bed  HEENT anicteric  Neck supple  Pulm/lungs Tachypneic, shallow breathing  CVS/Heart irregular  Abdomen:  Soft, nontender, nondistended  Extremities: No peripheral edema  Neurologic: Alert, oriented  Skin: Warm, dry  Access: pD catheter       Basic Metabolic Panel:   Recent Labs Lab 03/03/16 0442 03/04/16 0524  NA 138 139  K 4.9 4.5  CL 103 104  CO2 26 25  GLUCOSE 191* 111*  BUN 31* 33*  CREATININE 6.33* 6.12*  CALCIUM 8.6* 8.4*     CBC:  Recent Labs Lab 03/03/16 0442 03/04/16 0524  WBC 19.9* 10.5  NEUTROABS 15.9*  --   HGB 10.7* 9.7*  HCT 32.8* 28.9*  MCV 90.8 89.9  PLT 432 354      Microbiology:  Recent Results (from the past 720 hour(s))  Blood culture (routine x 2)     Status: None (Preliminary result)   Collection Time: 03/03/16  5:50 AM  Result Value Ref Range Status    Specimen Description BLOOD LEFT WRIST  Final   Special Requests BOTTLES DRAWN AEROBIC AND ANAEROBIC 2ML  Final   Culture NO GROWTH 1 DAY  Final   Report Status PENDING  Incomplete  Blood culture (routine x 2)     Status: None (Preliminary result)   Collection Time: 03/03/16  5:50 AM  Result Value Ref Range Status   Specimen Description BLOOD RIGHT ARM  Final   Special Requests   Final    BOTTLES DRAWN AEROBIC AND ANAEROBIC AER 2ML ANA 4ML   Culture  Setup Time Organism ID to follow  Final   Culture   Final    GRAM POSITIVE COCCI IN BOTH AEROBIC AND ANAEROBIC BOTTLES CRITICAL RESULT CALLED TO, READ BACK BY AND VERIFIED WITH: CALLED TO MATT MCBANE @ 6967 ON 03/04/2016 BY CAF    Report Status PENDING  Incomplete  Blood Culture ID Panel (Reflexed)     Status: Abnormal   Collection Time: 03/03/16  5:50 AM  Result Value Ref Range Status   Enterococcus species NOT DETECTED NOT DETECTED Final   Vancomycin resistance NOT DETECTED NOT DETECTED Final  Listeria monocytogenes NOT DETECTED NOT DETECTED Final   Staphylococcus species DETECTED (A) NOT DETECTED Final    Comment: CRITICAL RESULT CALLED TO, READ BACK BY AND VERIFIED WITH: MATT MCBANE @ 5035 ON 03/04/2016 BY CAF    Staphylococcus aureus NOT DETECTED NOT DETECTED Final   Methicillin resistance DETECTED (A) NOT DETECTED Final    Comment: CRITICAL RESULT CALLED TO, READ BACK BY AND VERIFIED WITH: MATT MCBANE @ 4656 ON 03/04/2016 BY CAF    Streptococcus species NOT DETECTED NOT DETECTED Final   Streptococcus agalactiae NOT DETECTED NOT DETECTED Final   Streptococcus pneumoniae NOT DETECTED NOT DETECTED Final   Streptococcus pyogenes NOT DETECTED NOT DETECTED Final   Acinetobacter baumannii NOT DETECTED NOT DETECTED Final   Enterobacteriaceae species NOT DETECTED NOT DETECTED Final   Enterobacter cloacae complex NOT DETECTED NOT DETECTED Final   Escherichia coli NOT DETECTED NOT DETECTED Final   Klebsiella oxytoca NOT DETECTED NOT  DETECTED Final   Klebsiella pneumoniae NOT DETECTED NOT DETECTED Final   Proteus species NOT DETECTED NOT DETECTED Final   Serratia marcescens NOT DETECTED NOT DETECTED Final   Carbapenem resistance NOT DETECTED NOT DETECTED Final   Haemophilus influenzae NOT DETECTED NOT DETECTED Final   Neisseria meningitidis NOT DETECTED NOT DETECTED Final   Pseudomonas aeruginosa NOT DETECTED NOT DETECTED Final   Candida albicans NOT DETECTED NOT DETECTED Final   Candida glabrata NOT DETECTED NOT DETECTED Final   Candida krusei NOT DETECTED NOT DETECTED Final   Candida parapsilosis NOT DETECTED NOT DETECTED Final   Candida tropicalis NOT DETECTED NOT DETECTED Final  Urine culture     Status: None   Collection Time: 03/03/16  6:03 AM  Result Value Ref Range Status   Specimen Description URINE, RANDOM  Final   Special Requests NONE  Final   Culture NO GROWTH Performed at [facility]   Final   Report Status 03/04/2016 FINAL  Final  MRSA PCR Screening     Status: None   Collection Time: 03/03/16 10:46 AM  Result Value Ref Range Status   MRSA by PCR NEGATIVE NEGATIVE Final    Comment:        The GeneXpert MRSA Assay (FDA approved for NASAL specimens only), is one component of a comprehensive MRSA colonization surveillance program. It is not intended to diagnose MRSA infection nor to guide or monitor treatment for MRSA infections.     Coagulation Studies: No results for input(s): LABPROT, INR in the last 72 hours.  Urinalysis:  Recent Labs  03/03/16 0603  COLORURINE YELLOW*  LABSPEC 1.015  PHURINE 8.0  GLUCOSEU >500*  HGBUR NEGATIVE  BILIRUBINUR NEGATIVE  KETONESUR NEGATIVE  PROTEINUR 100*  NITRITE NEGATIVE  LEUKOCYTESUR 3+*      Imaging: Dg Chest Portable 1 View  Result Date: 03/03/2016 CLINICAL DATA:  Shortness of breath beginning at 0200 hours today. Decreased oxygen saturation. Diaphoresis with labored breathing. EXAM: PORTABLE CHEST 1 VIEW COMPARISON:  CT  chest 01/19/2016.  Chest 01/19/2016. FINDINGS: Cardiac enlargement with central pulmonary vascular congestion. Bilateral pleural effusions. Consolidation demonstrated in both mid and lower lung zones, increasing since previous study. This likely reflects multifocal pneumonia. No pneumothorax. Calcification of the aorta. IMPRESSION: Cardiac enlargement with mild vascular congestion. Bilateral pleural effusions. Bilateral pulmonary consolidations, increasing since prior study, suggesting developing multifocal pneumonia. Electronically Signed   By: Lucienne Capers M.D.   On: 03/03/2016 05:18     Medications:     . [START ON 03/05/2016] ceFEPime (MAXIPIME) IV  2 g  Intravenous Q48H  . dialysis solution 1.5% low-MG/low-CA   Intraperitoneal Q24H  . gentamicin cream  1 application Topical Daily  . heparin  5,000 Units Subcutaneous Q8H  . insulin aspart  0-9 Units Subcutaneous TID WC  . levETIRAcetam  500 mg Oral BID  . levothyroxine  50 mcg Oral BH-q7a  . midodrine  5 mg Oral TID WC  . mometasone-formoterol  2 puff Inhalation BID  . simvastatin  20 mg Oral QHS  . sodium chloride flush  3 mL Intravenous Q12H  . tiotropium  18 mcg Inhalation Daily   sodium chloride, heparin, hydrALAZINE, sodium chloride flush, [START ON 03/05/2016] vancomycin  Assessment/ Plan:  60 y.o. female with CVA, Hypotension, diabetes mellitus type II, congestive heart failure, seizure disorder, hypothyroidism, DVT,  [facility] [address] Peritoneal dialysis  Prescription: CCPD with 6 exchanges 2 L fills  1. ESRD : on peritoneal dialysis.   - resume home prescription   2. Anemia of chronic kidney disease:  hemoglobin at goal - epo supplementation despite lung mass, as previously discussed with patient and family  3. Pneumonia and MRSA sepsis - one dose of vanc given yesterday One dose to be given today I have discussed with outpatient dialysis Nurse. She will get IP vancomycin ( 2 gm ) on Monday and  Friday  4.  Secondary hyperparathyroidism Monitor phosphorus during hospital admission  Discussed with Dr Ether Griffins    LOS: 1 Jaran Sainz 9/21/201711:08 AM

## 2016-03-04 NOTE — Progress Notes (Addendum)
Prime Dr. Curly Rim in regard to pt VS.; wheezing; refusal of dialysis.  Awaiting callback

## 2016-03-04 NOTE — Progress Notes (Signed)
PD Complete, pt disconnected.

## 2016-03-04 NOTE — Progress Notes (Signed)
Dialysis RN arrived to connect pt to Peritoneal Dialysis. Pt refused. Dialysis RN informed primary nurse. Primary nurse spoke to pt and informed pt on the importance of receiving dialysis. Pt continued to refuse. Primary nurse spoke to Dialysis nurse. Dialysis nurse informed primary nurse that he would notify MD on pt refusal. Primary nurse continue to monitor.

## 2016-03-04 NOTE — Progress Notes (Signed)
CCPD completed without issue. Pt tolerated well. Total UF 424 mL.

## 2016-03-04 NOTE — Progress Notes (Signed)
Sherri Davidson at [place] NAME: Sherri Davidson    MR#:  [id number]  DATE OF BIRTH:  1956-06-12  SUBJECTIVE:  CHIEF COMPLAINT:   Chief Complaint  Patient presents with  . Shortness of Breath  Sherri Davidson  is a 60 y.o. female with a known history of Lung cancer, congestive heart failure, CVA, end-stage renal disease on peritoneal dialysis, hypertension, hypothyroidism, diabetes mellitus, seizure disorder presented to the emergency room with difficulty breathing. Has been short of breath for the last couple of days and also had some fever and cough. Cough is productive of yellowish phlegm. Patient was recently managed for pneumonia hemoptysis secondary to lung cancer during her last hospitalization. No complaints of any chest pain. Has borderline troponin today during the evaluation in the emergency room. Chest x-ray during the workup also showed bilateral pleural effusions and pneumonia multifocal. Patient received broad-spectrum IV antibiotics while in the hospital. Blood cultures came back positive for MRSA, repeated blood cultures are pending. Patient is on vancomycin and cefepime at present. She feels good and wants to go home. Chest x-ray done yesterday revealed worsening bilateral pulmonary consolidations.    Review of Systems  Constitutional: Negative for chills, fever and weight loss.  HENT: Negative for congestion.   Eyes: Negative for blurred vision and double vision.  Respiratory: Positive for cough. Negative for sputum production, shortness of breath and wheezing.   Cardiovascular: Negative for chest pain, palpitations, orthopnea, leg swelling and PND.  Gastrointestinal: Negative for abdominal pain, blood in stool, constipation, diarrhea, nausea and vomiting.  Genitourinary: Negative for dysuria, frequency, hematuria and urgency.  Musculoskeletal: Negative for falls.  Neurological: Negative for dizziness, tremors, focal weakness and  headaches.  Endo/Heme/Allergies: Does not bruise/bleed easily.  Psychiatric/Behavioral: Negative for depression. The patient does not have insomnia.     VITAL SIGNS: Blood pressure (!) 151/85, pulse 99, temperature 98.3 F (36.8 C), temperature source Oral, resp. rate 19, height '5\' 6"'$  (1.676 m), weight 60.7 kg (133 lb 14.4 oz), SpO2 96 %.  PHYSICAL EXAMINATION:   GENERAL:  60 y.o.-year-old patient lying in the bed with no acute distress.  EYES: Pupils equal, round, reactive to light and accommodation. No scleral icterus. Extraocular muscles intact.  HEENT: Head atraumatic, normocephalic. Oropharynx and nasopharynx clear.  NECK:  Supple, no jugular venous distention. No thyroid enlargement, no tenderness.  LUNGS: Markedly diminished breath sounds bilaterally anteriorly and posteriorly, dullness to percussion, few intermittent wheezes scattered in the lower lung fields, no rales,rhonchi or crepitations. Not use of accessory muscles of respiration.  CARDIOVASCULAR: S1, S2 normal, distant. No murmurs, rubs, or gallops.  ABDOMEN: Soft, nontender, nondistended. Bowel sounds present. No organomegaly or mass.  EXTREMITIES: No pedal edema, cyanosis, or clubbing.  NEUROLOGIC: Cranial nerves II through XII are intact. Muscle strength 5/5 in all extremities. Sensation intact. Gait not checked.  PSYCHIATRIC: The patient is alert and oriented x 3.  SKIN: No obvious rash, lesion, or ulcer.   ORDERS/RESULTS REVIEWED:   CBC  Recent Labs Lab 03/03/16 0442 03/04/16 0524  WBC 19.9* 10.5  HGB 10.7* 9.7*  HCT 32.8* 28.9*  PLT 432 354  MCV 90.8 89.9  MCH 29.7 30.1  MCHC 32.7 33.5  RDW 17.6* 17.8*  LYMPHSABS 2.6  --   MONOABS 1.0*  --   EOSABS 0.2  --   BASOSABS 0.1  --    ------------------------------------------------------------------------------------------------------------------  Chemistries   Recent Labs Lab 03/03/16 0442 03/04/16 0524  NA 138 139  K  4.9 4.5  CL 103 104  CO2 26  25  GLUCOSE 191* 111*  BUN 31* 33*  CREATININE 6.33* 6.12*  CALCIUM 8.6* 8.4*   ------------------------------------------------------------------------------------------------------------------ estimated creatinine clearance is 9.3 mL/min (by C-G formula based on SCr of 6.12 mg/dL (H)). ------------------------------------------------------------------------------------------------------------------ No results for input(s): TSH, T4TOTAL, T3FREE, THYROIDAB in the last 72 hours.  Invalid input(s): FREET3  Cardiac Enzymes  Recent Labs Lab 03/03/16 0442  TROPONINI 0.06*   ------------------------------------------------------------------------------------------------------------------ Invalid input(s): POCBNP ---------------------------------------------------------------------------------------------------------------  RADIOLOGY: Dg Chest Portable 1 View  Result Date: 03/03/2016 CLINICAL DATA:  Shortness of breath beginning at 0200 hours today. Decreased oxygen saturation. Diaphoresis with labored breathing. EXAM: PORTABLE CHEST 1 VIEW COMPARISON:  CT chest 01/19/2016.  Chest 01/19/2016. FINDINGS: Cardiac enlargement with central pulmonary vascular congestion. Bilateral pleural effusions. Consolidation demonstrated in both mid and lower lung zones, increasing since previous study. This likely reflects multifocal pneumonia. No pneumothorax. Calcification of the aorta. IMPRESSION: Cardiac enlargement with mild vascular congestion. Bilateral pleural effusions. Bilateral pulmonary consolidations, increasing since prior study, suggesting developing multifocal pneumonia. Electronically Signed   By: Lucienne Capers M.D.   On: 03/03/2016 05:18    EKG:  Orders placed or performed during the hospital encounter of 01/18/16  . EKG 12-Lead  . EKG 12-Lead    ASSESSMENT AND PLAN:  Active Problems:   HCAP (healthcare-associated pneumonia)   Pressure injury of skin #1. MRSA bacteremia, continue  vancomycin, suspected source is pneumonia, patient is to continue antibiotics intravenously as per pharmacy, likely discharge home when ready with intraperitoneal instillation of vancomycin, discussed with Dr. Candiss Norse, awaiting for negative blood cultures. Get echo to rule out endocarditis.  #2Healthcare associated pneumonia, continue cefepime and vancomycin, get sputum cultures if possible, repeat chest x-ray in the next 1-2 days #3 End-stage renal disease, on peritoneal dialysis, continue peritoneal dialysis as per nephrology's recommendations. Patient has significant fluid retention in the lungs. #4. Pyuria, get urinary cultures to rule out infection   Management plans discussed with the patient, family and they are in agreement.   DRUG ALLERGIES:  Allergies  Allergen Reactions  . Albuterol Anaphylaxis  . Gabapentin Anaphylaxis and Other (See Comments)    Pt states she cant breathe  . Adhesive [Tape] Hives    Please use paper tape  . Salicylates Hives and Nausea Only    Uncoated.  Diona Fanti [Aspirin] Other (See Comments)    GI upset for non-enteric coated aspirin    CODE STATUS:     Code Status Orders        Start     Ordered   03/03/16 1029  Full code  Continuous     03/03/16 1028    Code Status History    Date Active Date Inactive Code Status Order ID Comments User Context   01/20/2016  4:29 AM 01/22/2016  8:56 PM Full Code [id number]  Saundra Shelling, MD Inpatient   01/20/2016  3:48 AM 01/20/2016  4:29 AM Full Code [id number]  Saundra Shelling, MD ED   01/18/2016  5:18 AM 01/18/2016  7:00 AM Full Code [id number]  Saundra Shelling, MD Inpatient   12/02/2015  3:15 PM 12/03/2015  9:02 PM Full Code [id number]  Vaughan Basta, MD Inpatient   11/08/2015 10:33 PM 11/21/2015  5:53 PM Full Code [id number]  Norval Morton, MD Inpatient   08/17/2015  8:07 AM 08/18/2015  6:39 PM Full Code [id number]  Epifanio Lesches, MD ED   05/24/2015  3:42 AM 05/28/2015  5:34 PM Full Code [id number]  Pavan Pyreddy,  MD  Inpatient    Advance Directive Documentation   Flowsheet Row Most Recent Value  Type of Advance Directive  Living will, Healthcare Power of Attorney  Pre-existing out of facility DNR order (yellow form or pink MOST form)  No data  "MOST" Form in Place?  No data      TOTAL TIME TAKING CARE OF THIS PATIENT: 40 minutes.    Theodoro Grist M.D on 03/04/2016 at 12:04 PM  Between 7am to 6pm - Pager - [phone]  After 6pm go to [email or web address] - password [email or web address] [facility]  Office  [phone]  CC: Primary care physician; [facility]

## 2016-03-04 NOTE — Progress Notes (Signed)
Post PD assessment 

## 2016-03-04 NOTE — Progress Notes (Signed)
Speech Language Pathology Treatment: Dysphagia  Patient Details Name: SHAWNIECE OYOLA MRN: [id number] DOB: 1955/07/12 Today's Date: 03/04/2016 Time: 0211-1552 SLP Time Calculation (min) (ACUTE ONLY): 36 min  Assessment / Plan / Recommendation Clinical Impression  Pt seen for toleration of diet dysphagia diet this afternoon. Pt had consumed several bites of pureed lunch meal and was requesting some ice cream. Magic Cup was provided d/t need for thickened liquids which pt consumed w/out swallowing deficits noted. Pt then requested a Coke which SLP thickened to Nectar consistency for her. Pt consumed sips of this via straw w/ no immediate, overt s/s of aspiration noted. Pt did not immediately like the thick consistency of the drink but agreed w/ the need for thickened liquids (previous s/s of aspiration; pulmonary decline d/t possible aspiration) when it was explained to her (again).  Recommend continue w/ current Dysphagia 1 diet w/ Nectar consistency liquids w/ aspiration precautions; monitoring during meals d/t Cognitive status; meds in Puree - Crushed as needed/able to for easier, safer swallowing. Pt appears at her baseline as described in previous ST tx during previous admissions. NSG updated on status; given education on thickening liquids.    HPI HPI: Pt is a 60 y.o. female with a known history of Lung cancer, pneumonia, congestive heart failure, CVA w/ hemiparesis, Dysphagia, end-stage renal disease on peritoneal dialysis, hypertension, hypothyroidism, diabetes mellitus, seizure disorder limited to the emergency room with difficulty breathing. Has been short of breath for the last couple of days and also had some fever and cough. Cough is productive of yellowish phlegm. Patient was recently managed for pneumonia hemoptysis secondary to lung cancer during her last hospitalization. No complaints of any chest pain. Has borderline troponin today during the evaluation in the emergency room. Chest x-ray  during the workup also showed bilateral pleural effusions and pneumonia multifocal. Pt was [facility] by [facility] at [facility] during a recent admission d/t pneumonia and dysphagia. She was recommended to remain on a Dysphagia 1(puree) diet w/ Nectar consistency liquids.       SLP Plan  Continue with current plan of care     Recommendations  Diet recommendations: Dysphagia 1 (puree);Nectar-thick liquid Liquids provided via: Cup;Straw Medication Administration: Crushed with puree Supervision: Patient able to self feed;Intermittent supervision to cue for compensatory strategies Compensations: Minimize environmental distractions;Slow rate;Small sips/bites;Follow solids with liquid Postural Changes and/or Swallow Maneuvers: Seated upright 90 degrees;Upright 30-60 min after meal                General recommendations:  (Dietician f/u) Oral Care Recommendations: Oral care BID;Staff/trained caregiver to provide oral care Follow up Recommendations: Home health SLP (for education on aspiration precautions; thickening liquids) Plan: Continue with current plan of care       [place], [place], CCC-SLP  Shante Maysonet 03/04/2016, 2:27 PM

## 2016-03-05 ENCOUNTER — Inpatient Hospital Stay
Admit: 2016-03-05 | Discharge: 2016-03-05 | Disposition: A | Discharge status: Home / Self Care | Payer: Medicare (Managed Care) | Attending: Internal Medicine | Admitting: Internal Medicine

## 2016-03-05 DIAGNOSIS — R7881 Bacteremia: Secondary | ICD-10-CM

## 2016-03-05 DIAGNOSIS — N186 End stage renal disease: Secondary | ICD-10-CM

## 2016-03-05 DIAGNOSIS — Z992 Dependence on renal dialysis: Secondary | ICD-10-CM

## 2016-03-05 DIAGNOSIS — R8281 Pyuria: Secondary | ICD-10-CM

## 2016-03-05 DIAGNOSIS — B957 Other staphylococcus as the cause of diseases classified elsewhere: Secondary | ICD-10-CM

## 2016-03-05 LAB — ECHOCARDIOGRAM COMPLETE
HEIGHTINCHES: 66 in
WEIGHTICAEL: 2142.4 [oz_av]

## 2016-03-05 LAB — HIV ANTIBODY (ROUTINE TESTING W REFLEX): HIV SCREEN 4TH GENERATION: NONREACTIVE

## 2016-03-05 LAB — GLUCOSE, CAPILLARY
GLUCOSE-CAPILLARY: 101 mg/dL — AB (ref 65–99)
Glucose-Capillary: 97 mg/dL (ref 65–99)

## 2016-03-05 LAB — VANCOMYCIN, RANDOM: Vancomycin Rm: 15

## 2016-03-05 LAB — HEMOGLOBIN: Hemoglobin: 11.2 g/dL — ABNORMAL LOW (ref 12.0–16.0)

## 2016-03-05 MED ORDER — LEVOFLOXACIN 500 MG PO TABS: 500.0000 mg | ORAL_TABLET | ORAL | 0 refills | Status: AC

## 2016-03-05 MED ORDER — VANCOMYCIN HCL IN DEXTROSE 750-5 MG/150ML-% IV SOLN
750.0000 mg | Freq: Once | INTRAVENOUS | Status: AC
  Administered 2016-03-05: 750 mg via INTRAVENOUS
  Filled 2016-03-05: qty 150

## 2016-03-05 MED ORDER — VANCOMYCIN HCL IN DEXTROSE 750-5 MG/150ML-% IV SOLN: 750.0000 mg | INTRAVENOUS | 0 refills | Status: AC | PRN

## 2016-03-05 MED ORDER — LEVOFLOXACIN 500 MG PO TABS
500.0000 mg | ORAL_TABLET | ORAL | Status: DC
  Administered 2016-03-05: 500 mg via ORAL
  Filled 2016-03-05: qty 1

## 2016-03-05 MED ORDER — NEPRO/CARBSTEADY PO LIQD
237.0000 mL | Freq: Two times a day (BID) | ORAL | Status: DC
  Administered 2016-03-05 (×2): 237 mL via ORAL

## 2016-03-05 NOTE — Care Management (Signed)
Patient to discharge today.  I have notified PACE MD, and RN.  Patient's sister to transport home and bring portable O2 tank.  Sister [name] to have education with RN Judson Roch at [facility] on completing PD vancomycin.  Patient to administer PD vanc for a total of 2 dose on Monday and Friday.  [name] confirms that she will arrange a time to meet with Judson Roch to complete education, as [name] is responsible for completing PD at home.  RNCM signing off.

## 2016-03-05 NOTE — Discharge Summary (Signed)
[place] [facility] [place] NAME: Sherri Davidson    MR#:  [id number]  DATE OF BIRTH:  June 20, 1955  DATE OF ADMISSION:  03/03/2016 ADMITTING PHYSICIAN: Sherri Lesches, MD  DATE OF DISCHARGE: No discharge date for patient encounter.  PRIMARY CARE PHYSICIAN: BROOKS, Sherri Primes B, FNP     ADMISSION DIAGNOSIS:  SOB (shortness of breath) [R06.02] HCAP (healthcare-associated pneumonia) [J18.9]  DISCHARGE DIAGNOSIS:  Principal Problem:   HCAP (healthcare-associated pneumonia) Active Problems:   Coagulase negative Staphylococcus bacteremia   Pressure injury of skin   ESRD on dialysis ([place])   Sterile pyuria   SECONDARY DIAGNOSIS:   Past Medical History:  Diagnosis Date  . Blood clot in vein right hand  . Cancer ([place])    lung cancer  . CHF (congestive heart failure) ([place])   . CVA, old, hemiparesis ([place])   . Diabetes ([place])   . ESRD on peritoneal dialysis ([place])   . Hypertension   . Hypothyroidism   . Seizures ([place])   . Stroke ([place])     .pro HOSPITAL COURSE:  DarlaBoggsis a 60 y.o.femalewith a known history of Lung cancer, congestive heart failure, CVA, end-stage renal disease on peritoneal dialysis, hypertension, hypothyroidism, diabetes mellitus, seizure disorder presented to the emergency room with difficulty breathing. She has been short of breath for the last couple of days and also had some fever and cough.Cough was productive of yellowish phlegm.Patient was recently treated for pneumonia with  hemoptysis secondary to lung cancer during her last hospitalization.No complaints of any chest pain.Has borderline troponin during the evaluation in the emergency room.Chest x-ray during the workup also showed bilateral pleural effusions and multifocal pneumonia.Patient received broad-spectrum IV antibiotics while in the hospital. Blood cultures came back positive for Coagulase-negative Staphylococcus, the patient was recommended to  continue vancomycin with peritoneal dialysis for 10 more days to complete course, along with levofloxacin for a total of 10 days. She felt good and wanted to go home despite repeated chest x-ray revealing worsening bilateral pulmonary consolidations. Speech therapist evaluated the patient and recommended to continue dysphagia 1 diet with nectar thick liquids. Legionella antigen testing was ordered. Patient was felt to be stable to be discharged home on dual antibiotic therapy with levofloxacin and vancomycin . Discussed with Dr. Candiss Davidson and patient's family who are agreeable Discussion by problem: #1. CNS bacteremia, ?contamination versus coming from PD,  continue vancomycin intraperitoneally for 10-14 days to complete course , discussed with Dr. Candiss Davidson  #2Healthcare associated pneumonia, no sputum cultures were obtained, blood culture was positive for coagulase negative Staphylococcus 1/4, get Legionella testing, initiate levofloxacin orally and vancomycin intraperitoneally, it is still possible that the patient has aspiration pneumonitis and congestive heart failure/pleural effusions on chest x-ray. Speech therapist evaluated patient and recommended dysphagia 1 diet with nectar thick liquids. Sherri Davidson is recommended to repeat chest x-ray in the next 1-2 weeks after discharge or earlier if condition worsens. Discussed with Dr. Candiss Davidson  #3 End-stage renal disease,  Refused peritoneal  dialysis last night.  #4. Pyuria, sterile,   urine culture was negative DISCHARGE CONDITIONS:   Stable  CONSULTS OBTAINED:  Treatment Team:  Sherri Lex, MD  DRUG ALLERGIES:   Allergies  Allergen Reactions  . Albuterol Anaphylaxis  . Gabapentin Anaphylaxis and Other (See Comments)    Pt states she cant breathe  . Adhesive [Tape] Hives    Please use paper tape  . Salicylates Hives and Nausea Only    Uncoated.  Sherri Davidson [Aspirin]  Other (See Comments)    GI upset for non-enteric coated aspirin    DISCHARGE MEDICATIONS:    Current Discharge Medication List    START taking these medications   Details  levofloxacin (LEVAQUIN) 500 MG tablet Take 1 tablet (500 mg total) by mouth every other day. Qty: 5 tablet, Refills: 0    Vancomycin (VANCOCIN) 750-5 MG/150ML-% SOLN Inject 150 mLs (750 mg total) into the vein as needed (Placeholder order indicating active vancomycin order. Plan to redose when vancomycin level <20 mcg/ml.). Qty: 600 mL, Refills: 0      CONTINUE these medications which have NOT CHANGED   Details  aspirin EC 81 MG tablet Take 1 tablet (81 mg total) by mouth daily. For heart protection    Fluticasone-Salmeterol (ADVAIR) 250-50 MCG/DOSE AEPB Inhale 1 puff into the lungs 2 (two) times daily.    levETIRAcetam (KEPPRA) 500 MG tablet Take 1 tablet (500 mg total) by mouth 2 (two) times daily. Qty: 60 tablet, Refills: 0    levothyroxine (SYNTHROID, LEVOTHROID) 50 MCG tablet Take 50 mcg by mouth daily. For hypothyroidism    loperamide (IMODIUM) 2 MG capsule Take 2-4 mg by mouth as needed for diarrhea or loose stools.    midodrine (PROAMATINE) 5 MG tablet Take 1 tablet (5 mg total) by mouth 3 (three) times daily with meals. Qty: 90 tablet, Refills: 0    multivitamin (RENA-VIT) TABS tablet Take 1 tablet by mouth at bedtime. Qty: 30 tablet, Refills: 0    oxyCODONE-acetaminophen (PERCOCET/ROXICET) 5-325 MG tablet Take 2 tablets by mouth daily as needed for moderate pain or severe pain.     potassium chloride (K-DUR,KLOR-CON) 10 MEQ tablet Take 10 mEq by mouth daily.     simvastatin (ZOCOR) 20 MG tablet Take 20 mg by mouth at bedtime.     tiotropium (SPIRIVA) 18 MCG inhalation capsule Place 1 capsule (18 mcg total) into inhaler and inhale daily. Qty: 30 capsule, Refills: 12    clopidogrel (PLAVIX) 75 MG tablet Take 1 tablet (75 mg total) by mouth daily. For heart protection         DISCHARGE INSTRUCTIONS:    The patient is to follow-up with primary care physician as outpatient, dialysis  as per prior schedule  If you experience worsening of your admission symptoms, develop shortness of breath, life threatening emergency, suicidal or homicidal thoughts you must seek medical attention immediately by calling 911 or calling your MD immediately  if symptoms less severe.  You Must read complete instructions/literature along with all the possible adverse reactions/side effects for all the Medicines you take and that have been prescribed to you. Take any new Medicines after you have completely understood and accept all the possible adverse reactions/side effects.   Please note  You were cared for by a hospitalist during your hospital stay. If you have any questions about your discharge medications or the care you received while you were in the hospital after you are discharged, you can call the unit and asked to speak with the hospitalist on call if the hospitalist that took care of you is not available. Once you are discharged, your primary care physician will handle any further medical issues. Please note that NO REFILLS for any discharge medications will be authorized once you are discharged, as it is imperative that you return to your primary care physician (or establish a relationship with a primary care physician if you do not have one) for your aftercare needs so that they can reassess your need for  medications and monitor your lab values.    Today   CHIEF COMPLAINT:   Chief Complaint  Patient presents with  . Shortness of Breath    HISTORY OF PRESENT ILLNESS:  Sherri Davidson  is a 60 y.o. female with a known history of Lung cancer, congestive heart failure, CVA, end-stage renal disease on peritoneal dialysis, hypertension, hypothyroidism, diabetes mellitus, seizure disorder presented to the emergency room with difficulty breathing. She has been short of breath for the last couple of days and also had some fever and cough.Cough was productive of yellowish phlegm.Patient was  recently treated for pneumonia with  hemoptysis secondary to lung cancer during her last hospitalization.No complaints of any chest pain.Has borderline troponin during the evaluation in the emergency room.Chest x-ray during the workup also showed bilateral pleural effusions and multifocal pneumonia.Patient received broad-spectrum IV antibiotics while in the hospital. Blood cultures came back positive for Coagulase-negative Staphylococcus, the patient was recommended to continue vancomycin with peritoneal dialysis for 10 more days to complete course, along with levofloxacin for a total of 10 days. She felt good and wanted to go home despite repeated chest x-ray revealing worsening bilateral pulmonary consolidations. Speech therapist evaluated the patient and recommended to continue dysphagia 1 diet with nectar thick liquids. Legionella antigen testing was ordered. Patient was felt to be stable to be discharged home on dual antibiotic therapy with levofloxacin and vancomycin . Discussed with Dr. Candiss Davidson and patient's family who are agreeable Discussion by problem: #1. CNS bacteremia, ?contamination versus coming from PD,  continue vancomycin intraperitoneally for 10-14 days to complete course , discussed with Dr. Candiss Davidson  #2Healthcare associated pneumonia, no sputum cultures were obtained, blood culture was positive for coagulase negative Staphylococcus 1/4, get Legionella testing, initiate levofloxacin orally and vancomycin intraperitoneally, it is still possible that the patient has aspiration pneumonitis and congestive heart failure/pleural effusions on chest x-ray. Speech therapist evaluated patient and recommended dysphagia 1 diet with nectar thick liquids. Sherri Davidson is recommended to repeat chest x-ray in the next 1-2 weeks after discharge or earlier if condition worsens. Discussed with Dr. Candiss Davidson  #3 End-stage renal disease,  Refused peritoneal  dialysis last night.  #4. Pyuria, sterile,   urine culture was  negative    VITAL SIGNS:  Blood pressure 137/84, pulse 90, temperature 97.9 F (36.6 C), resp. rate (!) 32, height '5\' 6"'$  (1.676 m), weight 60.7 kg (133 lb 14.4 oz), SpO2 95 %.  I/O:  No intake or output data in the 24 hours ending 03/05/16 1236  PHYSICAL EXAMINATION:  GENERAL:  60 y.o.-year-old patient lying in the bed with no acute distress.  EYES: Pupils equal, round, reactive to light and accommodation. No scleral icterus. Extraocular muscles intact.  HEENT: Head atraumatic, normocephalic. Oropharynx and nasopharynx clear.  NECK:  Supple, no jugular venous distention. No thyroid enlargement, no tenderness.  LUNGS: Markedly diminished breath sounds bilaterally posteriorly, no wheezing, rales,rhonchi or crepitation. No use of accessory muscles of respiration.  CARDIOVASCULAR: S1, S2 normal. No murmurs, rubs, or gallops.  ABDOMEN: Soft, non-tender, non-distended. Bowel sounds present. No organomegaly or mass.  EXTREMITIES: No pedal edema, cyanosis, or clubbing.  NEUROLOGIC: Cranial nerves II through XII are intact. Muscle strength 5/5 in all extremities. Sensation intact. Gait not checked.  PSYCHIATRIC: The patient is alert and oriented x 3.  SKIN: No obvious rash, lesion, or ulcer.   DATA REVIEW:   CBC  Recent Labs Lab 03/04/16 0524 03/05/16 0532  WBC 10.5  --   HGB 9.7* 11.2*  HCT  28.9*  --   PLT 354  --     Chemistries   Recent Labs Lab 03/04/16 0524  NA 139  K 4.5  CL 104  CO2 25  GLUCOSE 111*  BUN 33*  CREATININE 6.12*  CALCIUM 8.4*    Cardiac Enzymes  Recent Labs Lab 03/03/16 0442  TROPONINI 0.06*    Microbiology Results  Results for orders placed or performed during the hospital encounter of 03/03/16  Blood culture (routine x 2)     Status: None (Preliminary result)   Collection Time: 03/03/16  5:50 AM  Result Value Ref Range Status   Specimen Description BLOOD LEFT WRIST  Final   Special Requests BOTTLES DRAWN AEROBIC AND ANAEROBIC 2ML  Final    Culture NO GROWTH 2 DAYS  Final   Report Status PENDING  Incomplete  Blood culture (routine x 2)     Status: Abnormal (Preliminary result)   Collection Time: 03/03/16  5:50 AM  Result Value Ref Range Status   Specimen Description BLOOD RIGHT ARM  Final   Special Requests   Final    BOTTLES DRAWN AEROBIC AND ANAEROBIC AER 2ML ANA 4ML   Culture  Setup Time   Final    GRAM POSITIVE COCCI IN BOTH AEROBIC AND ANAEROBIC BOTTLES CRITICAL RESULT CALLED TO, READ BACK BY AND VERIFIED WITH: MATT MCBANE @ 1245 ON 03/04/2016 BY CAF Organism ID to follow    Culture (A)  Final    STAPHYLOCOCCUS SPECIES (COAGULASE NEGATIVE) THE SIGNIFICANCE OF ISOLATING THIS ORGANISM FROM A SINGLE SET OF BLOOD CULTURES WHEN MULTIPLE SETS ARE DRAWN IS UNCERTAIN. PLEASE NOTIFY THE MICROBIOLOGY DEPARTMENT WITHIN ONE WEEK IF SPECIATION AND SENSITIVITIES ARE REQUIRED. Performed at [facility]    Report Status PENDING  Incomplete  Blood Culture ID Panel (Reflexed)     Status: Abnormal   Collection Time: 03/03/16  5:50 AM  Result Value Ref Range Status   Enterococcus species NOT DETECTED NOT DETECTED Final   Vancomycin resistance NOT DETECTED NOT DETECTED Final   Listeria monocytogenes NOT DETECTED NOT DETECTED Final   Staphylococcus species DETECTED (A) NOT DETECTED Final    Comment: CRITICAL RESULT CALLED TO, READ BACK BY AND VERIFIED WITH: MATT MCBANE @ 0352 ON 03/04/2016 BY CAF    Staphylococcus aureus NOT DETECTED NOT DETECTED Final   Methicillin resistance DETECTED (A) NOT DETECTED Final    Comment: CRITICAL RESULT CALLED TO, READ BACK BY AND VERIFIED WITH: MATT MCBANE @ 8099 ON 03/04/2016 BY CAF    Streptococcus species NOT DETECTED NOT DETECTED Final   Streptococcus agalactiae NOT DETECTED NOT DETECTED Final   Streptococcus pneumoniae NOT DETECTED NOT DETECTED Final   Streptococcus pyogenes NOT DETECTED NOT DETECTED Final   Acinetobacter baumannii NOT DETECTED NOT DETECTED Final   Enterobacteriaceae  species NOT DETECTED NOT DETECTED Final   Enterobacter cloacae complex NOT DETECTED NOT DETECTED Final   Escherichia coli NOT DETECTED NOT DETECTED Final   Klebsiella oxytoca NOT DETECTED NOT DETECTED Final   Klebsiella pneumoniae NOT DETECTED NOT DETECTED Final   Proteus species NOT DETECTED NOT DETECTED Final   Serratia marcescens NOT DETECTED NOT DETECTED Final   Carbapenem resistance NOT DETECTED NOT DETECTED Final   Haemophilus influenzae NOT DETECTED NOT DETECTED Final   Neisseria meningitidis NOT DETECTED NOT DETECTED Final   Pseudomonas aeruginosa NOT DETECTED NOT DETECTED Final   Candida albicans NOT DETECTED NOT DETECTED Final   Candida glabrata NOT DETECTED NOT DETECTED Final   Candida krusei NOT DETECTED NOT  DETECTED Final   Candida parapsilosis NOT DETECTED NOT DETECTED Final   Candida tropicalis NOT DETECTED NOT DETECTED Final  Urine culture     Status: None   Collection Time: 03/03/16  6:03 AM  Result Value Ref Range Status   Specimen Description URINE, RANDOM  Final   Special Requests NONE  Final   Culture NO GROWTH Performed at [facility]   Final   Report Status 03/04/2016 FINAL  Final  MRSA PCR Screening     Status: None   Collection Time: 03/03/16 10:46 AM  Result Value Ref Range Status   MRSA by PCR NEGATIVE NEGATIVE Final    Comment:        The GeneXpert MRSA Assay (FDA approved for NASAL specimens only), is one component of a comprehensive MRSA colonization surveillance program. It is not intended to diagnose MRSA infection nor to guide or monitor treatment for MRSA infections.   CULTURE, BLOOD (ROUTINE X 2) w Reflex to ID Panel     Status: None (Preliminary result)   Collection Time: 03/04/16  8:17 AM  Result Value Ref Range Status   Specimen Description BLOOD RIGHT AC  Final   Special Requests   Final    BOTTLES DRAWN AEROBIC AND ANAEROBIC AER 9ML ANA 7ML   Culture NO GROWTH < 24 HOURS  Final   Report Status PENDING  Incomplete   CULTURE, BLOOD (ROUTINE X 2) w Reflex to ID Panel     Status: None (Preliminary result)   Collection Time: 03/04/16  8:26 AM  Result Value Ref Range Status   Specimen Description BLOOD LEFT AC  Final   Special Requests   Final    BOTTLES DRAWN AEROBIC AND ANAEROBIC AER 11ML ANA 7ML   Culture NO GROWTH < 24 HOURS  Final   Report Status PENDING  Incomplete    RADIOLOGY:  Dg Chest Port 1 View  Result Date: 03/04/2016 CLINICAL DATA:  Shortness of breath tonight. History of CHF and hypertension. EXAM: PORTABLE CHEST 1 VIEW COMPARISON:  03/03/2016 FINDINGS: Cardiac enlargement with pulmonary vascular congestion. Bilateral perihilar infiltrates consistent with edema and/or pneumonia. Bilateral pleural effusions. Changes are progressing since previous study. No pneumothorax. Calcified and tortuous aorta. IMPRESSION: Progressing congestive changes with cardiac enlargement, pulmonary vascular congestion, perihilar infiltrates, and bilateral pleural effusions. Electronically Signed   By: Lucienne Capers M.D.   On: 03/04/2016 22:33    EKG:   Orders placed or performed during the hospital encounter of 01/18/16  . EKG 12-Lead  . EKG 12-Lead      Management plans discussed with the patient, family and they are in agreement.  CODE STATUS:     Code Status Orders        Start     Ordered   03/03/16 1029  Full code  Continuous     03/03/16 1028    Code Status History    Date Active Date Inactive Code Status Order ID Comments User Context   03/03/2016 10:28 AM 03/04/2016  5:04 PM Full Code [id number]  Saundra Shelling, MD Inpatient   01/20/2016  4:29 AM 01/22/2016  8:56 PM Full Code [id number]  Saundra Shelling, MD Inpatient   01/20/2016  3:48 AM 01/20/2016  4:29 AM Full Code [id number]  Saundra Shelling, MD ED   01/18/2016  5:18 AM 01/18/2016  7:00 AM Full Code [id number]  Saundra Shelling, MD Inpatient   12/02/2015  3:15 PM 12/03/2015  9:02 PM Full Code [id number]  Vaughan Basta, MD Inpatient  11/08/2015 10:33  PM 11/21/2015  5:53 PM Full Code [id number]  Norval Morton, MD Inpatient   08/17/2015  8:07 AM 08/18/2015  6:39 PM Full Code [id number]  Sherri Lesches, MD ED   05/24/2015  3:42 AM 05/28/2015  5:34 PM Full Code [id number]  Saundra Shelling, MD Inpatient    Advance Directive Documentation   Flowsheet Row Most Recent Value  Type of Advance Directive  Living will, Healthcare Power of Attorney  Pre-existing out of facility DNR order (yellow form or pink MOST form)  No data  "MOST" Form in Place?  No data      TOTAL TIME TAKING CARE OF THIS PATIENT: 40 minutes.  Discussed with patient's sister and Dr. Rance Muir M.D on 03/05/2016 at 12:36 PM  Between 7am to 6pm - Pager - [phone]  After 6pm go to [email or web address] - password [email or web address] [facility]  Office  [phone]  CC: Primary care physician; Talbert Cage, FNP

## 2016-03-05 NOTE — Progress Notes (Signed)
Subjective:   Patient is known to our practice from outpatient. She was admitted for shortness of breath Chest x-ray shows cardiac enlargement with mild vascular congestion.  Bilateral pleural effusions and bilateral pulmonary consolidations.  Overall feels fair. Refused PD last night Blood culture Coag neg staph   Objective:  Vital signs in last 24 hours:  Temp:  [97.6 F (36.4 C)-98.5 F (36.9 C)] 97.7 F (36.5 C) (09/22 1411) Pulse Rate:  [89-94] 94 (09/22 1411) Resp:  [20-32] 20 (09/22 1411) BP: (124-176)/(73-98) 124/73 (09/22 1411) SpO2:  [94 %-99 %] 94 % (09/22 1411)  Weight change:  Filed Weights   03/03/16 0443 03/04/16 0526  Weight: 61.2 kg (135 lb) 60.7 kg (133 lb 14.4 oz)    Intake/Output:   No intake or output data in the 24 hours ending 03/05/16 1421   Physical Exam: General: No acute distress, laying in the bed  HEENT anicteric  Neck supple  Pulm/lungs Tachypneic, shallow breathing, decreased BS at bases, no crackles  CVS/Heart irregular  Abdomen:  Soft, nontender, nondistended  Extremities: trace peripheral edema  Neurologic: Alert, oriented  Skin: Warm, dry  Access: pD catheter       Basic Metabolic Panel:   Recent Labs Lab 03/03/16 0442 03/04/16 0524  NA 138 139  K 4.9 4.5  CL 103 104  CO2 26 25  GLUCOSE 191* 111*  BUN 31* 33*  CREATININE 6.33* 6.12*  CALCIUM 8.6* 8.4*     CBC:  Recent Labs Lab 03/03/16 0442 03/04/16 0524 03/05/16 0532  WBC 19.9* 10.5  --   NEUTROABS 15.9*  --   --   HGB 10.7* 9.7* 11.2*  HCT 32.8* 28.9*  --   MCV 90.8 89.9  --   PLT 432 354  --       Microbiology:  Recent Results (from the past 720 hour(s))  Blood culture (routine x 2)     Status: None (Preliminary result)   Collection Time: 03/03/16  5:50 AM  Result Value Ref Range Status   Specimen Description BLOOD LEFT WRIST  Final   Special Requests BOTTLES DRAWN AEROBIC AND ANAEROBIC 2ML  Final   Culture NO GROWTH 2 DAYS  Final   Report Status PENDING  Incomplete  Blood culture (routine x 2)     Status: Abnormal (Preliminary result)   Collection Time: 03/03/16  5:50 AM  Result Value Ref Range Status   Specimen Description BLOOD RIGHT ARM  Final   Special Requests   Final    BOTTLES DRAWN AEROBIC AND ANAEROBIC AER 2ML ANA 4ML   Culture  Setup Time   Final    GRAM POSITIVE COCCI IN BOTH AEROBIC AND ANAEROBIC BOTTLES CRITICAL RESULT CALLED TO, READ BACK BY AND VERIFIED WITH: MATT MCBANE @ 3875 ON 03/04/2016 BY [facility] Organism ID to follow    Culture (A)  Final    STAPHYLOCOCCUS SPECIES (COAGULASE NEGATIVE) THE SIGNIFICANCE OF ISOLATING THIS ORGANISM FROM A SINGLE SET OF BLOOD CULTURES WHEN MULTIPLE SETS ARE DRAWN IS UNCERTAIN. PLEASE NOTIFY THE MICROBIOLOGY DEPARTMENT WITHIN ONE WEEK IF SPECIATION AND SENSITIVITIES ARE REQUIRED. Performed at [facility]    Report Status PENDING  Incomplete  Blood Culture ID Panel (Reflexed)     Status: Abnormal   Collection Time: 03/03/16  5:50 AM  Result Value Ref Range Status   Enterococcus species NOT DETECTED NOT DETECTED Final   Vancomycin resistance NOT DETECTED NOT DETECTED Final   Listeria monocytogenes NOT DETECTED NOT DETECTED Final   Staphylococcus  species DETECTED (A) NOT DETECTED Final    Comment: CRITICAL RESULT CALLED TO, READ BACK BY AND VERIFIED WITH: MATT MCBANE @ 1025 ON 03/04/2016 BY CAF    Staphylococcus aureus NOT DETECTED NOT DETECTED Final   Methicillin resistance DETECTED (A) NOT DETECTED Final    Comment: CRITICAL RESULT CALLED TO, READ BACK BY AND VERIFIED WITH: MATT MCBANE @ 8527 ON 03/04/2016 BY CAF    Streptococcus species NOT DETECTED NOT DETECTED Final   Streptococcus agalactiae NOT DETECTED NOT DETECTED Final   Streptococcus pneumoniae NOT DETECTED NOT DETECTED Final   Streptococcus pyogenes NOT DETECTED NOT DETECTED Final   Acinetobacter baumannii NOT DETECTED NOT DETECTED Final   Enterobacteriaceae species NOT DETECTED NOT DETECTED  Final   Enterobacter cloacae complex NOT DETECTED NOT DETECTED Final   Escherichia coli NOT DETECTED NOT DETECTED Final   Klebsiella oxytoca NOT DETECTED NOT DETECTED Final   Klebsiella pneumoniae NOT DETECTED NOT DETECTED Final   Proteus species NOT DETECTED NOT DETECTED Final   Serratia marcescens NOT DETECTED NOT DETECTED Final   Carbapenem resistance NOT DETECTED NOT DETECTED Final   Haemophilus influenzae NOT DETECTED NOT DETECTED Final   Neisseria meningitidis NOT DETECTED NOT DETECTED Final   Pseudomonas aeruginosa NOT DETECTED NOT DETECTED Final   Candida albicans NOT DETECTED NOT DETECTED Final   Candida glabrata NOT DETECTED NOT DETECTED Final   Candida krusei NOT DETECTED NOT DETECTED Final   Candida parapsilosis NOT DETECTED NOT DETECTED Final   Candida tropicalis NOT DETECTED NOT DETECTED Final  Urine culture     Status: None   Collection Time: 03/03/16  6:03 AM  Result Value Ref Range Status   Specimen Description URINE, RANDOM  Final   Special Requests NONE  Final   Culture NO GROWTH Performed at [facility]   Final   Report Status 03/04/2016 FINAL  Final  MRSA PCR Screening     Status: None   Collection Time: 03/03/16 10:46 AM  Result Value Ref Range Status   MRSA by PCR NEGATIVE NEGATIVE Final    Comment:        The GeneXpert MRSA Assay (FDA approved for NASAL specimens only), is one component of a comprehensive MRSA colonization surveillance program. It is not intended to diagnose MRSA infection nor to guide or monitor treatment for MRSA infections.   CULTURE, BLOOD (ROUTINE X 2) w Reflex to ID Panel     Status: None (Preliminary result)   Collection Time: 03/04/16  8:17 AM  Result Value Ref Range Status   Specimen Description BLOOD RIGHT AC  Final   Special Requests   Final    BOTTLES DRAWN AEROBIC AND ANAEROBIC AER 9ML ANA 7ML   Culture NO GROWTH < 24 HOURS  Final   Report Status PENDING  Incomplete  CULTURE, BLOOD (ROUTINE X 2) w Reflex to  ID Panel     Status: None (Preliminary result)   Collection Time: 03/04/16  8:26 AM  Result Value Ref Range Status   Specimen Description BLOOD LEFT AC  Final   Special Requests   Final    BOTTLES DRAWN AEROBIC AND ANAEROBIC AER 11ML ANA 7ML   Culture NO GROWTH < 24 HOURS  Final   Report Status PENDING  Incomplete    Coagulation Studies: No results for input(s): LABPROT, INR in the last 72 hours.  Urinalysis:  Recent Labs  03/03/16 0603  COLORURINE YELLOW*  LABSPEC 1.015  PHURINE 8.0  GLUCOSEU >500*  HGBUR NEGATIVE  BILIRUBINUR NEGATIVE  KETONESUR NEGATIVE  PROTEINUR 100*  NITRITE NEGATIVE  LEUKOCYTESUR 3+*      Imaging: Dg Chest Port 1 View  Result Date: 03/04/2016 CLINICAL DATA:  Shortness of breath tonight. History of CHF and hypertension. EXAM: PORTABLE CHEST 1 VIEW COMPARISON:  03/03/2016 FINDINGS: Cardiac enlargement with pulmonary vascular congestion. Bilateral perihilar infiltrates consistent with edema and/or pneumonia. Bilateral pleural effusions. Changes are progressing since previous study. No pneumothorax. Calcified and tortuous aorta. IMPRESSION: Progressing congestive changes with cardiac enlargement, pulmonary vascular congestion, perihilar infiltrates, and bilateral pleural effusions. Electronically Signed   By: Lucienne Capers M.D.   On: 03/04/2016 22:33     Medications:     . ceFEPime (MAXIPIME) IV  2 g Intravenous Q48H  . dialysis solution 1.5% low-MG/low-CA   Intraperitoneal Q24H  . feeding supplement (NEPRO CARB STEADY)  237 mL Oral BID BM  . gentamicin cream  1 application Topical Daily  . heparin  5,000 Units Subcutaneous Q8H  . insulin aspart  0-9 Units Subcutaneous TID WC  . levETIRAcetam  500 mg Oral BID  . levofloxacin  500 mg Oral Q48H  . levothyroxine  50 mcg Oral BH-q7a  . midodrine  5 mg Oral TID WC  . mometasone-formoterol  2 puff Inhalation BID  . simvastatin  20 mg Oral QHS  . sodium chloride flush  3 mL Intravenous Q12H  .  tiotropium  18 mcg Inhalation Daily   sodium chloride, heparin, hydrALAZINE, sodium chloride flush, vancomycin  Assessment/ Plan:  60 y.o. female with CVA, Hypotension, diabetes mellitus type II, congestive heart failure, seizure disorder, hypothyroidism, DVT,  [facility] [address] Peritoneal dialysis  Prescription: CCPD with 6 exchanges 2 L fills  1. ESRD : on peritoneal dialysis.   - resume home prescription   2. Anemia of chronic kidney disease:  hemoglobin at goal - epo supplementation despite lung mass, as previously discussed with patient and family  3. Pneumonia and MRSA sepsis I have discussed with outpatient dialysis Nurse. She will get IP vancomycin ( 2 gm ) on Monday and Friday  4.  Secondary hyperparathyroidism Monitor phosphorus during hospital admission  Discussed with Dr Ether Griffins    LOS: 2 Alok Minshall 9/22/20172:21 PM

## 2016-03-05 NOTE — Progress Notes (Signed)
Pharmacy Antibiotic Note  Sherri Davidson is a 60 y.o. female admitted on 03/03/2016 with pneumonia (HCAP).  Pharmacy has been consulted for vancomycin and cefepime dosing. Per H&P pt with ESRD on peritoneal dialysis.   Pt received vancomycin 1 g x1 at 0602 and cefepime 2 g IV x1 at 0638 in the ED.   Plan: Cefepime 2g IV q48h for PD dosing.   Vancomycin 500 mg IV x1 additional dose for total loading dose of 1500 mg.  Random vanc level 9/22 AM (Day 3 of therapy). Will plan to redose when vancomycin level <20 mcg/ml. Will enter a placeholder vancomycin order for 750 mg IV prn indicating an active vancomycin order but may need to adjust dose based on levels and PD plans.     Height: '5\' 6"'$  (167.6 cm) Weight: 133 lb 14.4 oz (60.7 kg) IBW/kg (Calculated) : 59.3  Temp (24hrs), Avg:98.1 F (36.7 C), Min:97.6 F (36.4 C), Max:98.5 F (36.9 C)   Recent Labs Lab 03/03/16 0442 03/03/16 0550 03/04/16 0524 03/05/16 0532  WBC 19.9*  --  10.5  --   CREATININE 6.33*  --  6.12*  --   LATICACIDVEN  --  1.4  --   --   VANCORANDOM  --   --   --  15    Estimated Creatinine Clearance: 9.3 mL/min (by C-G formula based on SCr of 6.12 mg/dL (H)).    Allergies  Allergen Reactions  . Albuterol Anaphylaxis  . Gabapentin Anaphylaxis and Other (See Comments)    Pt states she cant breathe  . Adhesive [Tape] Hives    Please use paper tape  . Salicylates Hives and Nausea Only    Uncoated.  Diona Fanti [Aspirin] Other (See Comments)    GI upset for non-enteric coated aspirin    Antimicrobials this admission: 9/20 vanc >>  9/20 cefepime  >>   Dose adjustments this admission: 9/22 AM vanc level 15. 750 mg x1 ordered. Recheck level tomorrow AM.   Microbiology results: 9/20 BCx: sent 9/20 UCx: sent  9/20 MRSA PCR: sent  9/21 AM Lab called [facility] Staphylococcus spp. mecA(+.) Patient is already on vancomycin. No new recommendations.  Thank you for allowing pharmacy to be a part of this patient'Davidson  care.  Sherri Davidson 03/05/2016 6:41 AM

## 2016-03-05 NOTE — Consult Note (Signed)
Psychiatry: I came to do this consult and was told by unit staff that the consult had been canceled. This was confirmed by a phone call to the hospitalist. Please recontact me if consult is required.

## 2016-03-05 NOTE — Progress Notes (Signed)
[facility] NAME: Sherri Davidson    MR#:  [id number]  DATE OF BIRTH:  05/02/1956  SUBJECTIVE:  CHIEF COMPLAINT:   Chief Complaint  Patient presents with  . Shortness of Breath  Sherri Davidson  is a 60 y.o. female with a known history of Lung cancer, congestive heart failure, CVA, end-stage renal disease on peritoneal dialysis, hypertension, hypothyroidism, diabetes mellitus, seizure disorder presented to the emergency room with difficulty breathing. Has been short of breath for the last couple of days and also had some fever and cough. Cough is productive of yellowish phlegm. Patient was recently managed for pneumonia hemoptysis secondary to lung cancer during her last hospitalization. No complaints of any chest pain. Has borderline troponin today during the evaluation in the emergency room. Chest x-ray during the workup also showed bilateral pleural effusions and pneumonia multifocal. Patient received broad-spectrum IV antibiotics while in the hospital. Blood cultures came back positive for Coagulase-negative Staphylococcus, repeated blood cultures are pending. Patient is on vancomycin and cefepime at present. She feels good and wants to go home. Chest x-ray done yesterday revealed worsening bilateral pulmonary consolidations. Speech therapist evaluated the patient and recommended to continue dysphagia 1 diet with nectar thick liquids. Patient denies any shortness of breath. Refused peritoneal dialysis last night.     Review of Systems  Constitutional: Negative for chills, fever and weight loss.  HENT: Negative for congestion.   Eyes: Negative for blurred vision and double vision.  Respiratory: Negative for sputum production, shortness of breath and wheezing.   Cardiovascular: Negative for chest pain, palpitations, orthopnea, leg swelling and PND.  Gastrointestinal: Negative for abdominal pain, blood in stool, constipation, diarrhea,  nausea and vomiting.  Genitourinary: Negative for dysuria, frequency, hematuria and urgency.  Musculoskeletal: Negative for falls.  Neurological: Negative for dizziness, tremors, focal weakness and headaches.  Endo/Heme/Allergies: Does not bruise/bleed easily.  Psychiatric/Behavioral: Negative for depression. The patient does not have insomnia.     VITAL SIGNS: Blood pressure 137/84, pulse 90, temperature 97.9 F (36.6 C), resp. rate (!) 32, height '5\' 6"'$  (1.676 m), weight 60.7 kg (133 lb 14.4 oz), SpO2 95 %.  PHYSICAL EXAMINATION:   GENERAL:  60 y.o.-year-old patient lying in the bed with no acute distress.  EYES: Pupils equal, round, reactive to light and accommodation. No scleral icterus. Extraocular muscles intact.  HEENT: Head atraumatic, normocephalic. Oropharynx and nasopharynx clear.  NECK:  Supple, no jugular venous distention. No thyroid enlargement, no tenderness.  LUNGS: Markedly diminished breath sounds bilaterally  posteriorly, dullness to percussion, no wheezing, rales,rhonchi or crepitations. Not use of accessory muscles of respiration.  CARDIOVASCULAR: S1, S2 normal, distant. No murmurs, rubs, or gallops.  ABDOMEN: Soft, nontender, nondistended. Bowel sounds present. No organomegaly or mass.  EXTREMITIES: No pedal edema, cyanosis, or clubbing.  NEUROLOGIC: Cranial nerves II through XII are intact. Muscle strength 5/5 in all extremities. Sensation intact. Gait not checked.  PSYCHIATRIC: The patient is alert and oriented x 3. Agitated intermittently and requests to be discharged home despite pleading to her about poor lung condition SKIN: No obvious rash, lesion, or ulcer.   ORDERS/RESULTS REVIEWED:   CBC  Recent Labs Lab 03/03/16 0442 03/04/16 0524 03/05/16 0532  WBC 19.9* 10.5  --   HGB 10.7* 9.7* 11.2*  HCT 32.8* 28.9*  --   PLT 432 354  --   MCV 90.8 89.9  --   MCH 29.7 30.1  --   MCHC 32.7 33.5  --  RDW 17.6* 17.8*  --   LYMPHSABS 2.6  --   --   MONOABS  1.0*  --   --   EOSABS 0.2  --   --   BASOSABS 0.1  --   --    ------------------------------------------------------------------------------------------------------------------  Chemistries   Recent Labs Lab 03/03/16 0442 03/04/16 0524  NA 138 139  K 4.9 4.5  CL 103 104  CO2 26 25  GLUCOSE 191* 111*  BUN 31* 33*  CREATININE 6.33* 6.12*  CALCIUM 8.6* 8.4*   ------------------------------------------------------------------------------------------------------------------ estimated creatinine clearance is 9.3 mL/min (by C-G formula based on SCr of 6.12 mg/dL (H)). ------------------------------------------------------------------------------------------------------------------ No results for input(s): TSH, T4TOTAL, T3FREE, THYROIDAB in the last 72 hours.  Invalid input(s): FREET3  Cardiac Enzymes  Recent Labs Lab 03/03/16 0442  TROPONINI 0.06*   ------------------------------------------------------------------------------------------------------------------ Invalid input(s): POCBNP ---------------------------------------------------------------------------------------------------------------  RADIOLOGY: Dg Chest Port 1 View  Result Date: 03/04/2016 CLINICAL DATA:  Shortness of breath tonight. History of CHF and hypertension. EXAM: PORTABLE CHEST 1 VIEW COMPARISON:  03/03/2016 FINDINGS: Cardiac enlargement with pulmonary vascular congestion. Bilateral perihilar infiltrates consistent with edema and/or pneumonia. Bilateral pleural effusions. Changes are progressing since previous study. No pneumothorax. Calcified and tortuous aorta. IMPRESSION: Progressing congestive changes with cardiac enlargement, pulmonary vascular congestion, perihilar infiltrates, and bilateral pleural effusions. Electronically Signed   By: Lucienne Capers M.D.   On: 03/04/2016 22:33    EKG:  Orders placed or performed during the hospital encounter of 01/18/16  . EKG 12-Lead  . EKG 12-Lead     ASSESSMENT AND PLAN:  Active Problems:   HCAP (healthcare-associated pneumonia)   Pressure injury of skin #1. CNS bacteremia, ?contamination,  get infectious disease consultation   #2Healthcare associated pneumonia, no sputum cultures were obtained, blood culture was positive for coagulase negative Staphylococcus 1/4, discontinue intravenous antibiotics, initiate levofloxacin orally is still possible that patient has aspiration pneumonitis with congestive heart failure/pleural effusions on chest x-ray. Speech therapist evaluated patient and recommended dysphagia 1 diet with nectar thick liquids  #3 End-stage renal disease,  Refused peritoneal  dialysislast night. We are going to ask psychiatrist in see patient in consultation to help delineate her ability to comprehend complex issues including antibiotic therapy for pneumonia, dialysis for end-stage renal disease . #4. Pyuria, sterile,   urine culture was negative.    Management plans discussed with the patient, family and they are in agreement.   DRUG ALLERGIES:  Allergies  Allergen Reactions  . Albuterol Anaphylaxis  . Gabapentin Anaphylaxis and Other (See Comments)    Pt states she cant breathe  . Adhesive [Tape] Hives    Please use paper tape  . Salicylates Hives and Nausea Only    Uncoated.  Diona Fanti [Aspirin] Other (See Comments)    GI upset for non-enteric coated aspirin    CODE STATUS:     Code Status Orders        Start     Ordered   03/03/16 1029  Full code  Continuous     03/03/16 1028    Code Status History    Date Active Date Inactive Code Status Order ID Comments User Context   01/20/2016  4:29 AM 01/22/2016  8:56 PM Full Code [id number]  Saundra Shelling, MD Inpatient   01/20/2016  3:48 AM 01/20/2016  4:29 AM Full Code [id number]  Saundra Shelling, MD ED   01/18/2016  5:18 AM 01/18/2016  7:00 AM Full Code [id number]  Saundra Shelling, MD Inpatient   12/02/2015  3:15 PM 12/03/2015  9:02  PM Full Code [id number]  Vaughan Basta, MD Inpatient   11/08/2015 10:33 PM 11/21/2015  5:53 PM Full Code [id number]  Norval Morton, MD Inpatient   08/17/2015  8:07 AM 08/18/2015  6:39 PM Full Code [id number]  Epifanio Lesches, MD ED   05/24/2015  3:42 AM 05/28/2015  5:34 PM Full Code [id number]  Saundra Shelling, MD Inpatient    Advance Directive Documentation   Flowsheet Row Most Recent Value  Type of Advance Directive  Living will, Healthcare Power of Attorney  Pre-existing out of facility DNR order (yellow form or pink MOST form)  No data  "MOST" Form in Place?  No data      TOTAL TIME TAKING CARE OF THIS PATIENT: 40 minutes.    Theodoro Grist M.D on 03/05/2016 at 12:06 PM  Between 7am to 6pm - Pager - [phone]  After 6pm go to [email or web address] - password [email or web address]  Office  [phone]  CC: Primary care physician; Talbert Cage, FNP

## 2016-03-05 NOTE — Progress Notes (Signed)
IV and tele were removed. Discharge instructions, follow-up appointments, and prescriptions were provided to the pt and sister (POA). The pt was taken downstairs via wheelchair by NT.

## 2016-03-05 NOTE — Progress Notes (Signed)
*  PRELIMINARY RESULTS* Echocardiogram 2D Echocardiogram has been performed.  Sherri Davidson 03/05/2016, 10:48 AM

## 2016-03-06 LAB — CULTURE, BLOOD (ROUTINE X 2)

## 2016-03-08 LAB — LEGIONELLA PNEUMOPHILA SEROGP 1 UR AG: L. pneumophila Serogp 1 Ur Ag: NEGATIVE

## 2016-03-09 LAB — CULTURE, BLOOD (ROUTINE X 2)
CULTURE: NO GROWTH
Culture: NO GROWTH
Culture: NO GROWTH

## 2016-03-16 ENCOUNTER — Ambulatory Visit: Admit: 2016-03-16 | Payer: Medicare (Managed Care) | Admitting: Gastroenterology

## 2016-03-16 SURGERY — ESOPHAGOGASTRODUODENOSCOPY (EGD) WITH PROPOFOL
Anesthesia: General

## 2016-04-14 DEATH — deceased

## 2017-12-12 IMAGING — MR MR MRA HEAD W/O CM
10 of 13 series · 30 of 48 positions shown · non-contrast
Comparison: 11/08/2015 head CT.  05/24/2015 brain MR.

CLINICAL DATA: 59-year-old hypertensive diabetic female on
dialysis. History of seizures. Off seizure medication for 3 days.
Seizure this morning. Left-sided flaccid. Remote infarct. Subsequent
encounter.

EXAM:
MRI HEAD WITHOUT CONTRAST
MRA HEAD WITHOUT CONTRAST
TECHNIQUE: Multiplanar, multiecho pulse sequences of the brain and surrounding
structures were obtained without intravenous contrast. Angiographic
images of the head were obtained using MRA technique without
contrast.

[Series 3: DWI · axial · 3.0mm · 1.09mm/px · z∈[-75,+81]mm · 7 of 106 slices shown (1 of 4)]
[im 1/106]
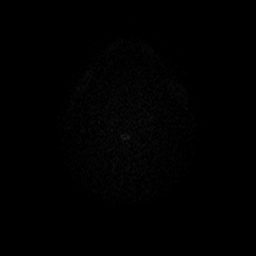
[im 18/106]
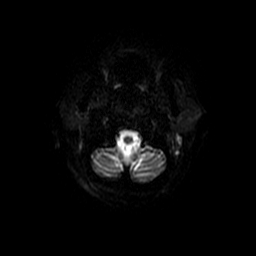
[im 36/106]
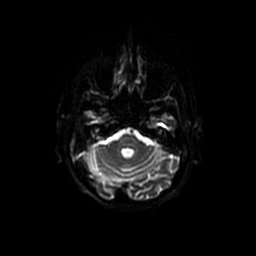
[im 53/106]
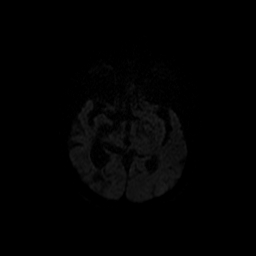
[im 71/106]
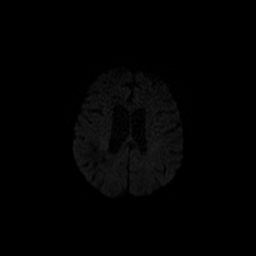
[im 88/106]
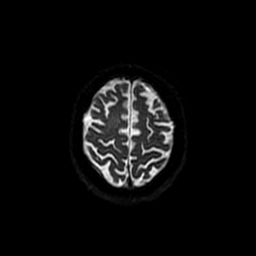
[im 106/106]
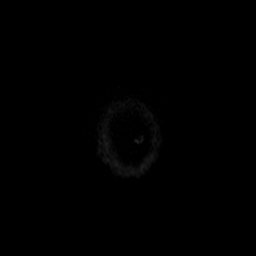

[Series 4: DWI · coronal · 5.0mm · 1.09mm/px · 4 of 72 slices shown (2 of 4)]
[im 1/72]
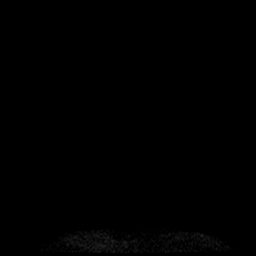
[im 24/72]
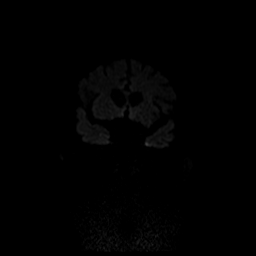
[im 48/72]
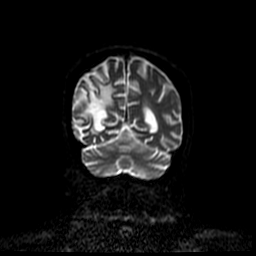
[im 72/72]
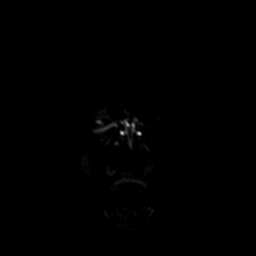

[Series 5: (id) mt fs · axial · 1.4mm · 0.43mm/px · z∈[-44,-33]mm · 2 of 136 slices shown]
[im 1/136]
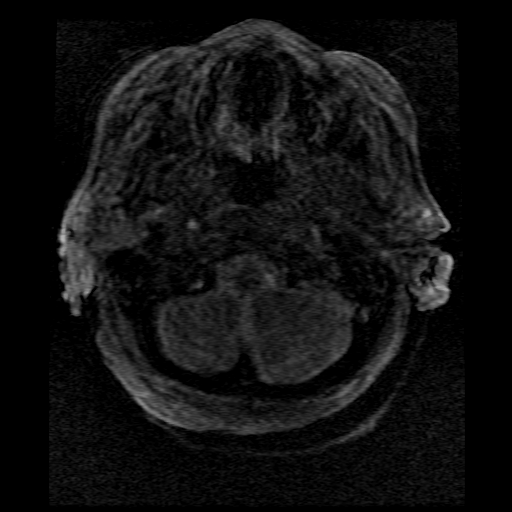
[im 17/136]
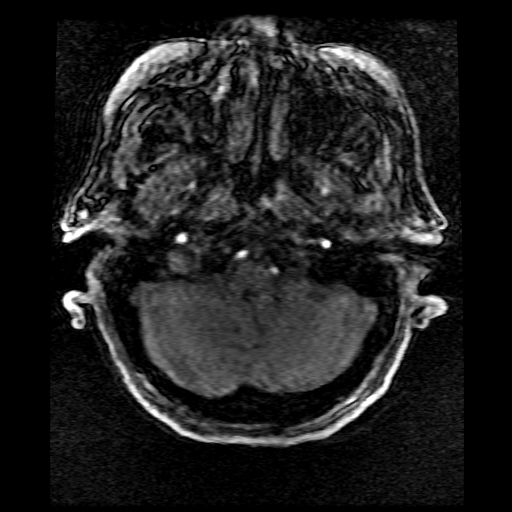

[Series 6: T2 · axial · 5.0mm · 0.43mm/px · z∈[-68,+82]mm · 2 of 26 slices shown (1 of 2)]
[im 1/26]
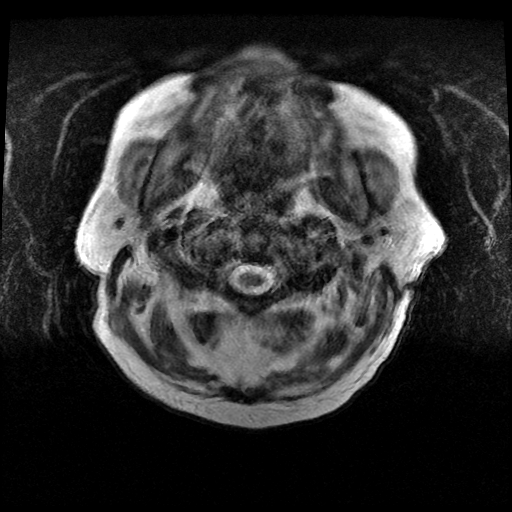
[im 26/26]
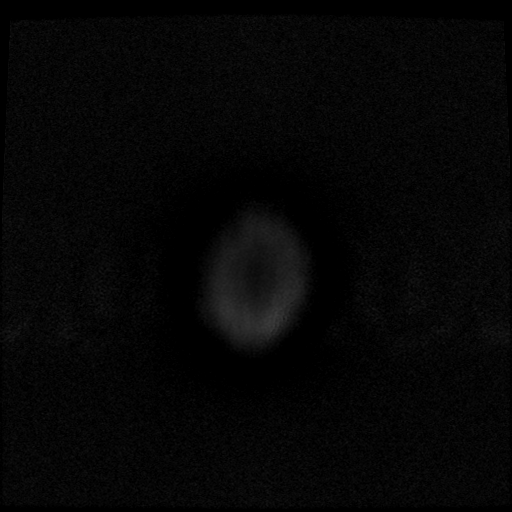

[Series 7: FLAIR · axial · 5.0mm · 0.43mm/px · z∈[-68,+82]mm · 2 of 26 slices shown]
[im 1/26]
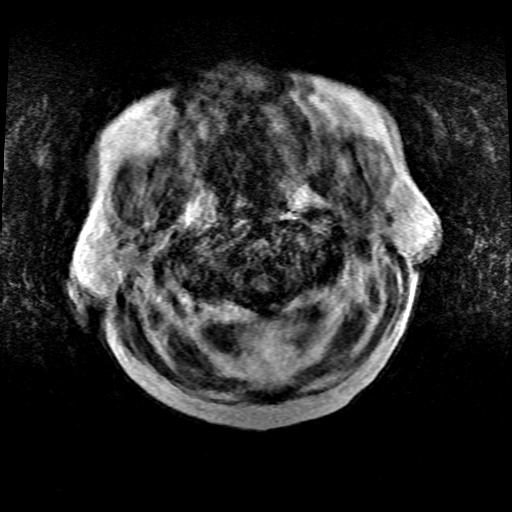
[im 26/26]
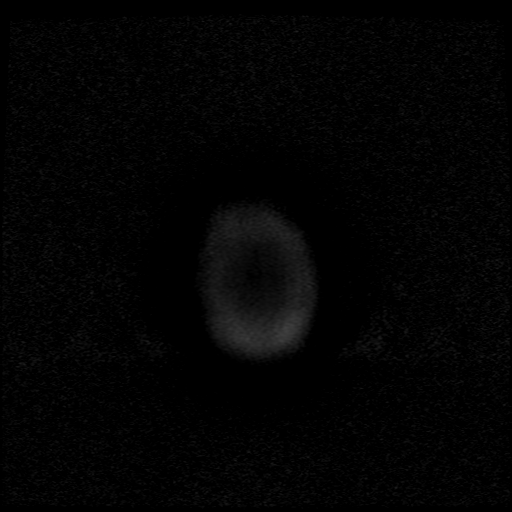

[Series 8: T1 · sagittal · 5.0mm · 0.47mm/px · 2 of 25 slices shown]
[im 1/25]
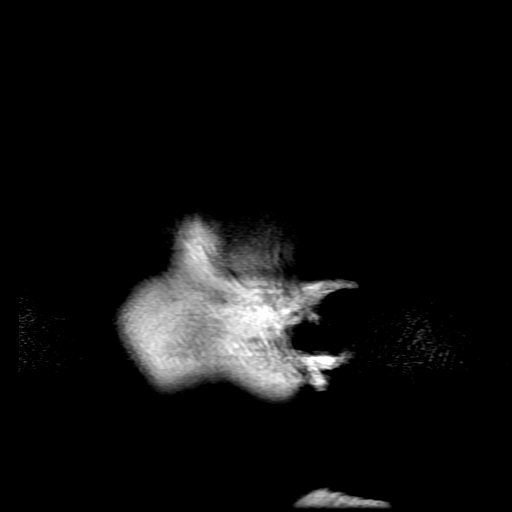
[im 25/25]
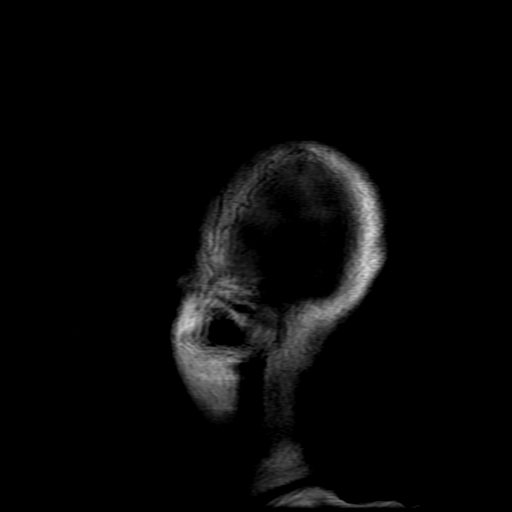

[Series 12: T2 post-contrast · coronal · 5.0mm · 0.39mm/px · 2 of 28 slices shown]
[im 1/28]
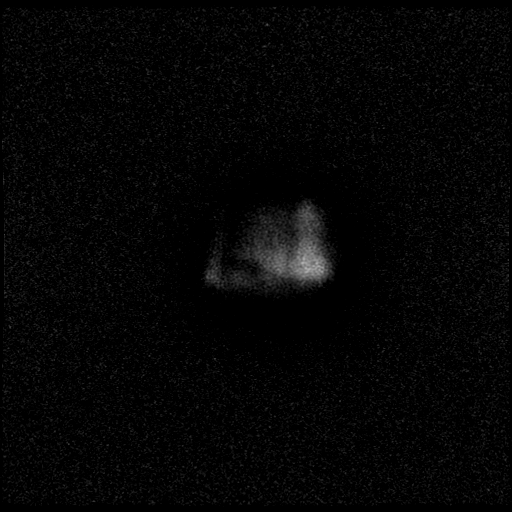
[im 28/28]
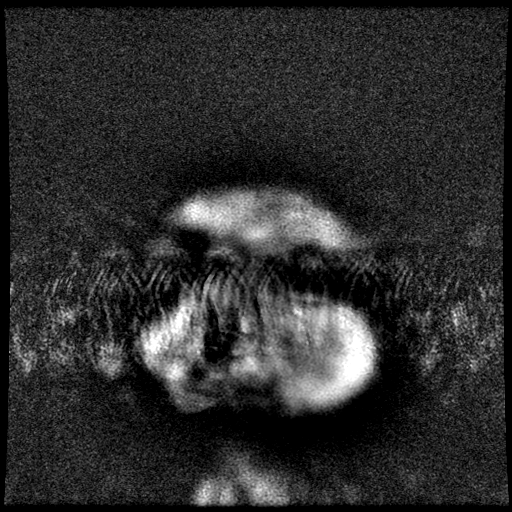

[Series 15: T2 · coronal · 3.0mm · 0.35mm/px · 2 of 23 slices shown (2 of 2)]
[im 1/23]
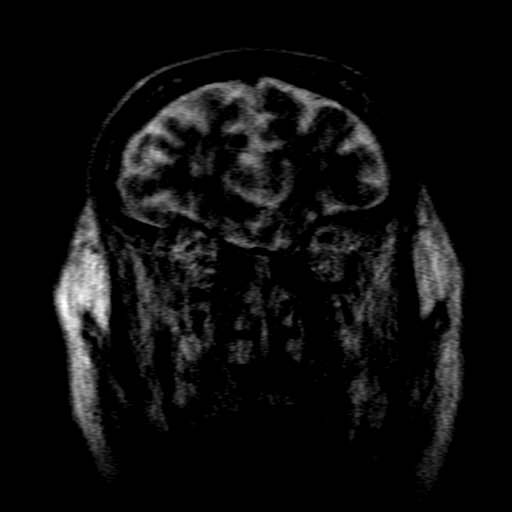
[im 23/23]
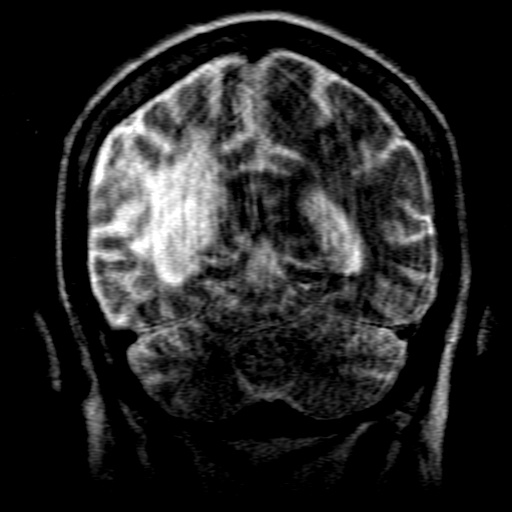

[Series 300: DWI · axial · 3.0mm · 1.09mm/px · z∈[-75,+81]mm · 4 of 53 slices shown (3 of 4)]
[im 1/53]
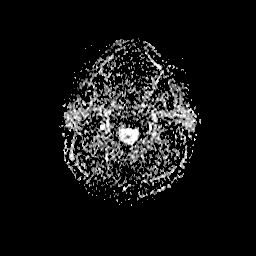
[im 18/53]
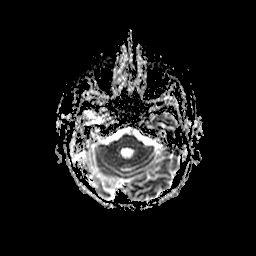
[im 35/53]
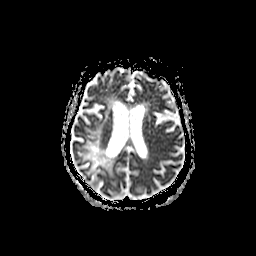
[im 53/53]
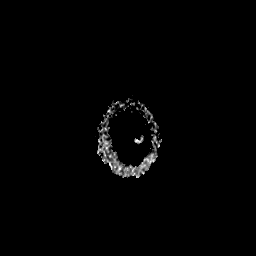

[Series 400: DWI · coronal · 5.0mm · 1.09mm/px · 3 of 36 slices shown (4 of 4)]
[im 1/36]
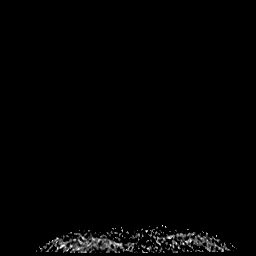
[im 18/36]
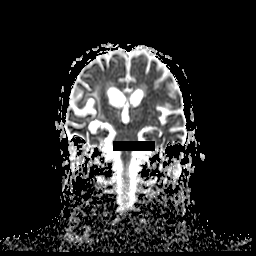
[im 36/36]
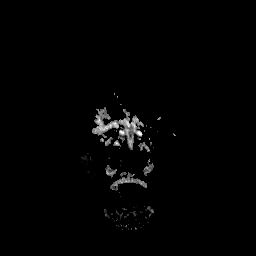

[30 of 48 positions shown; findings below may reference images not displayed]

FINDINGS: MRI HEAD FINDINGS

Exam is motion degraded.

No acute infarct or intracranial hemorrhage.

Remote large right hemispheric infarct with encephalomalacia right
frontal lobe, parietal lobe and temporal lobe with subsequent
dilation right temporal horn. Remote infarct right corona
radiata/lenticular nucleus. Remote small infarct left caudate.
Remote small infarct left thalamus.

Prominent chronic microvascular changes.

Global atrophy without hydrocephalus.

No intracranial mass lesion noted on this unenhanced exam.

Cervical medullary junction unremarkable.

MRA HEAD FINDINGS

Exam limited by motion degradation with markedly limited evaluation
for grading stenosis and detecting aneurysm.

All that can be stated with certainty is that there is flow within
portions of the internal carotid arteries bilaterally, vertebral
arteries and basilar artery.

Findings suspicious for prominent intracranial atherosclerotic
changes including significant stenosis left internal carotid artery
cavernous segment, significant posterior cerebral artery narrowing
greater on the left and decrease number of visualized right middle
cerebral artery branch vessels (consistent with patient's remote
right hemispheric infarct).
IMPRESSION: MRI HEAD

Exam is significantly motion degraded.

No acute infarct or intracranial hemorrhage.

Remote large right hemispheric infarct. Remote infarct right corona
radiata/lenticular nucleus. Remote small infarct left caudate.
Remote small infarct left thalamus.

Prominent chronic microvascular changes.

Global atrophy without hydrocephalus.

MRA HEAD

Exam limited by motion degradation with markedly limited evaluation
for grading stenosis and detecting aneurysm.

All that can be stated with certainty is that there is flow within
portions of the internal carotid arteries bilaterally, vertebral
arteries and basilar artery.

Findings suspicious for prominent intracranial atherosclerotic
changes.

## 2017-12-13 IMAGING — CR DG CHEST 1V PORT
1 series · 1 of 1 positions shown · non-contrast
Comparison: CT 09/23/2015 and previous

CLINICAL DATA: cough

EXAM:
PORTABLE CHEST - 1 VIEW

[AP]
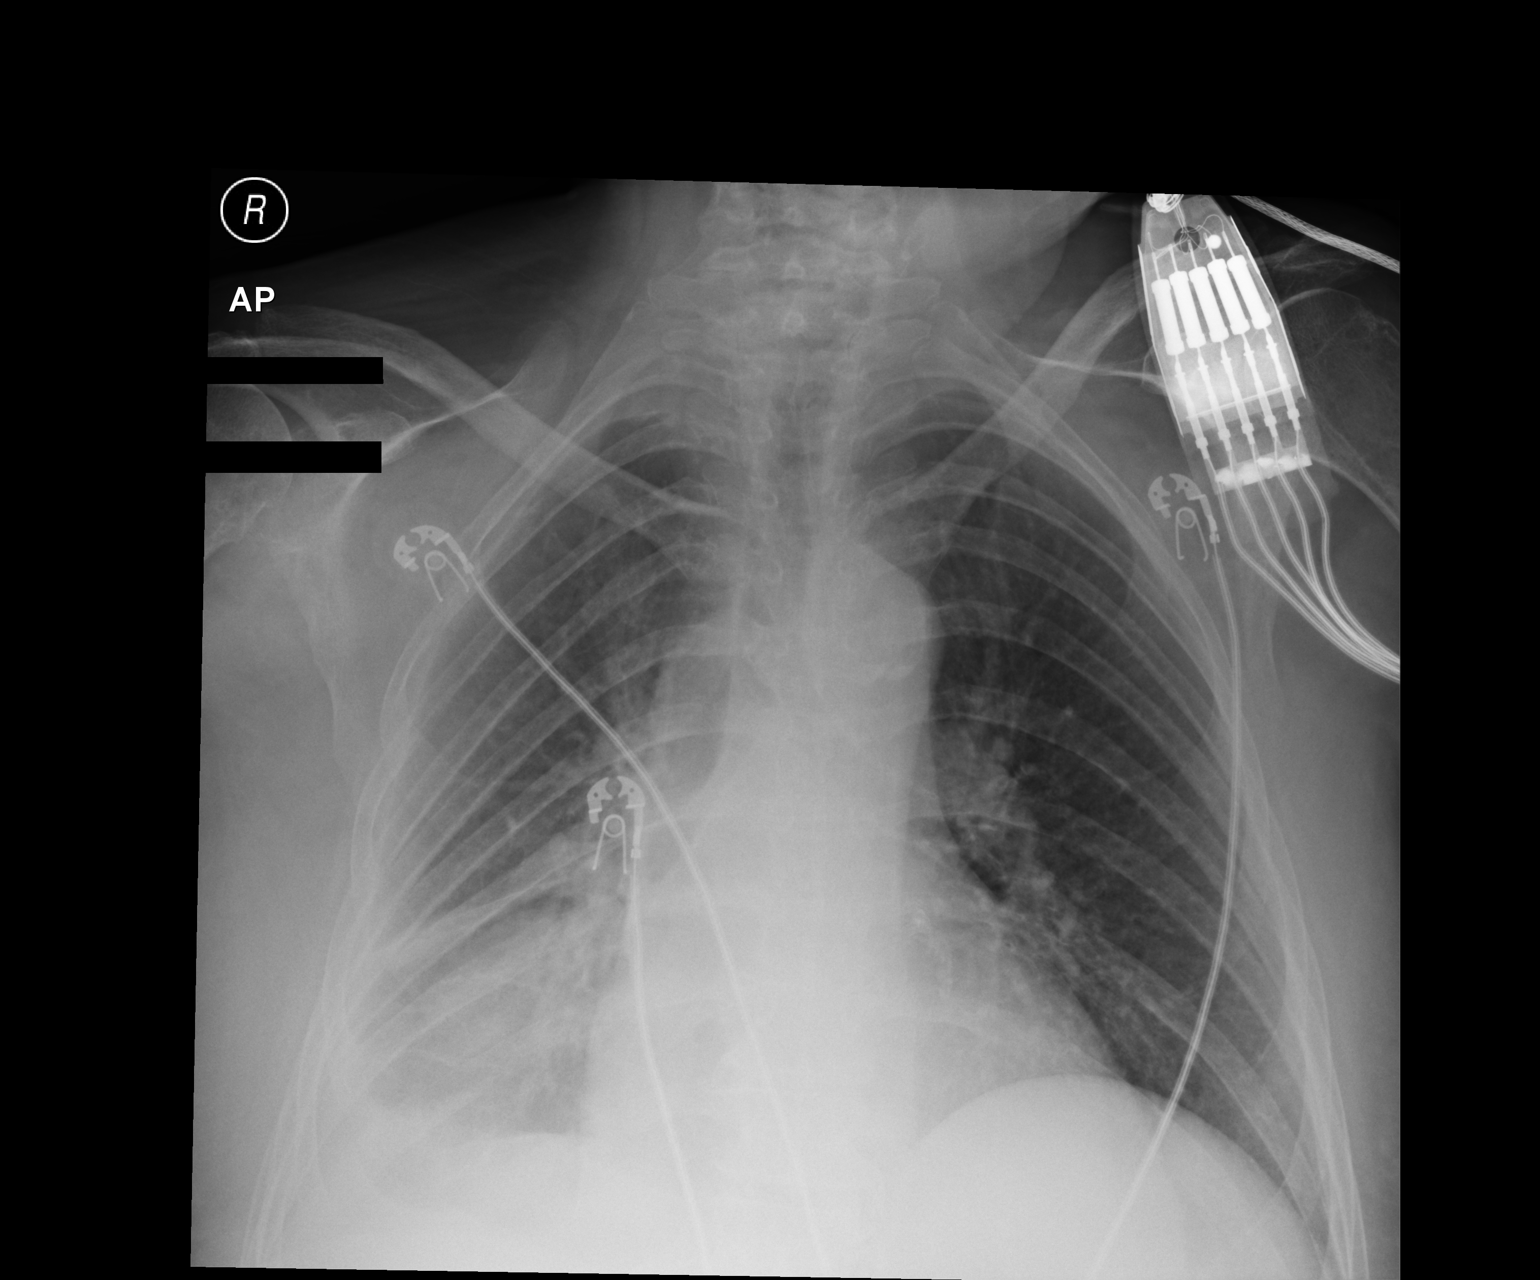

[1 of 1 positions shown; findings below may reference images not displayed]

FINDINGS: Persistent right pleural effusion and right lower lung airspace
opacity. Left lung clear.

Heart size normal.  Atheromatous aorta.

No pneumothorax.

Visualized bones unremarkable.
IMPRESSION: 1. Persistent right pleural effusion and right lower lung
consolidation/atelectasis.
